# Patient Record
Sex: Male | Born: 1935 | Race: White | Hispanic: No | Marital: Married | Smoking: Former smoker
Health system: Southern US, Community
[De-identification: ages and names within clinical notes are randomized; demographics above are authoritative.]

## PROBLEM LIST (undated history)

## (undated) DIAGNOSIS — G20A1 Parkinson's disease without dyskinesia, without mention of fluctuations: Secondary | ICD-10-CM

## (undated) DIAGNOSIS — R251 Tremor, unspecified: Secondary | ICD-10-CM

## (undated) DIAGNOSIS — K219 Gastro-esophageal reflux disease without esophagitis: Secondary | ICD-10-CM

## (undated) DIAGNOSIS — E119 Type 2 diabetes mellitus without complications: Secondary | ICD-10-CM

## (undated) DIAGNOSIS — H409 Unspecified glaucoma: Secondary | ICD-10-CM

## (undated) DIAGNOSIS — D649 Anemia, unspecified: Secondary | ICD-10-CM

## (undated) DIAGNOSIS — K922 Gastrointestinal hemorrhage, unspecified: Secondary | ICD-10-CM

## (undated) DIAGNOSIS — E785 Hyperlipidemia, unspecified: Secondary | ICD-10-CM

## (undated) DIAGNOSIS — J449 Chronic obstructive pulmonary disease, unspecified: Secondary | ICD-10-CM

## (undated) DIAGNOSIS — K279 Peptic ulcer, site unspecified, unspecified as acute or chronic, without hemorrhage or perforation: Secondary | ICD-10-CM

## (undated) DIAGNOSIS — N400 Enlarged prostate without lower urinary tract symptoms: Secondary | ICD-10-CM

## (undated) DIAGNOSIS — H9190 Unspecified hearing loss, unspecified ear: Secondary | ICD-10-CM

## (undated) DIAGNOSIS — M1990 Unspecified osteoarthritis, unspecified site: Secondary | ICD-10-CM

## (undated) DIAGNOSIS — Z87442 Personal history of urinary calculi: Secondary | ICD-10-CM

## (undated) DIAGNOSIS — H3530 Unspecified macular degeneration: Secondary | ICD-10-CM

## (undated) DIAGNOSIS — I10 Essential (primary) hypertension: Secondary | ICD-10-CM

## (undated) DIAGNOSIS — G4730 Sleep apnea, unspecified: Secondary | ICD-10-CM

## (undated) DIAGNOSIS — G709 Myoneural disorder, unspecified: Secondary | ICD-10-CM

## (undated) HISTORY — PX: KIDNEY STONE SURGERY: SHX686

## (undated) HISTORY — DX: Essential (primary) hypertension: I10

## (undated) HISTORY — DX: Tremor, unspecified: R25.1

## (undated) HISTORY — DX: Type 2 diabetes mellitus without complications: E11.9

## (undated) HISTORY — DX: Unspecified hearing loss, unspecified ear: H91.90

## (undated) HISTORY — DX: Hyperlipidemia, unspecified: E78.5

## (undated) HISTORY — DX: Unspecified glaucoma: H40.9

## (undated) HISTORY — DX: Unspecified macular degeneration: H35.30

## (undated) HISTORY — DX: Unspecified osteoarthritis, unspecified site: M19.90

## (undated) HISTORY — DX: Benign prostatic hyperplasia without lower urinary tract symptoms: N40.0

## (undated) HISTORY — DX: Peptic ulcer, site unspecified, unspecified as acute or chronic, without hemorrhage or perforation: K27.9

## (undated) HISTORY — DX: Personal history of urinary calculi: Z87.442

## (undated) HISTORY — PX: CATARACT EXTRACTION: SUR2

---

## 1995-05-25 HISTORY — PX: CHOLECYSTECTOMY: SHX55

## 2004-05-24 HISTORY — PX: TOTAL KNEE ARTHROPLASTY: SHX125

## 2007-05-25 HISTORY — PX: COLONOSCOPY: SHX174

## 2007-05-25 LAB — HM COLONOSCOPY

## 2010-05-24 HISTORY — PX: BELPHAROPTOSIS REPAIR: SHX369

## 2011-12-01 LAB — CBC: platelet count: 191

## 2011-12-01 LAB — COMPREHENSIVE METABOLIC PANEL
ALT: 26 U/L (ref 10–40)
AST: 17 U/L
BUN: 19 mg/dL (ref 4–21)
Potassium: 4.2 mmol/L
Total Bilirubin: 1.1 mg/dL

## 2011-12-01 LAB — LIPID PANEL
Cholesterol, Total: 175
HDL: 47 mg/dL (ref 35–70)
Triglycerides: 152

## 2011-12-01 LAB — HEMOGLOBIN A1C: A1c: 7.4

## 2011-12-23 HISTORY — PX: US ECHOCARDIOGRAPHY: HXRAD669

## 2011-12-23 HISTORY — PX: OTHER SURGICAL HISTORY: SHX169

## 2012-10-20 ENCOUNTER — Telehealth: Payer: Self-pay | Admitting: Family Medicine

## 2012-10-20 NOTE — Telephone Encounter (Signed)
Forms in your IN box for completion. (The patient has been advised that you had to review records from previous MD prior to filling out handicapped placard since you had not seen him yet). Please return them to me when complete.   Thanks!

## 2012-10-20 NOTE — Telephone Encounter (Signed)
Please see the original note below. The medical records for this pt have just been rec'd from Dr. Leticia Clas. I have place the records along with the [place] handicap placard application on your desk to be completed. Thank you.

## 2012-10-20 NOTE — Telephone Encounter (Signed)
Pt stopped by and signed the Request & Authorization for Use/Disclosure of Protected Health Information form.  This form has been faxed to Dr. Ezzie Dural in [facility] to get pt's records.  Once records are here, the pt's handicap placard application is in Dr. Nicanor Alcon folder where we put the new pt forms prior to their visit so it can be completed prior to his July apptmt. His new pt forms are also in this folder (the application is actually attached to those).  Once this application is completed could you please notify the pt it is ready for pickup? Thank you.

## 2012-10-21 ENCOUNTER — Encounter: Payer: Self-pay | Admitting: Family Medicine

## 2012-10-21 NOTE — Telephone Encounter (Signed)
Reviewed few notes sent, updated chart, and placed form in Kim's box.

## 2012-10-23 NOTE — Telephone Encounter (Signed)
Copy made and returned to patient.

## 2012-10-24 ENCOUNTER — Encounter: Payer: Self-pay | Admitting: Family Medicine

## 2012-12-05 ENCOUNTER — Encounter: Payer: Self-pay | Admitting: Family Medicine

## 2012-12-05 ENCOUNTER — Ambulatory Visit (INDEPENDENT_AMBULATORY_CARE_PROVIDER_SITE_OTHER): Payer: Medicare Other | Admitting: Family Medicine

## 2012-12-05 ENCOUNTER — Other Ambulatory Visit: Payer: Self-pay | Admitting: Student

## 2012-12-05 VITALS — BP 116/74 | HR 84 | Temp 98.6°F | Ht 69.0 in | Wt 214.8 lb

## 2012-12-05 DIAGNOSIS — M1990 Unspecified osteoarthritis, unspecified site: Secondary | ICD-10-CM

## 2012-12-05 DIAGNOSIS — E119 Type 2 diabetes mellitus without complications: Secondary | ICD-10-CM

## 2012-12-05 DIAGNOSIS — E785 Hyperlipidemia, unspecified: Secondary | ICD-10-CM

## 2012-12-05 DIAGNOSIS — H409 Unspecified glaucoma: Secondary | ICD-10-CM

## 2012-12-05 DIAGNOSIS — N400 Enlarged prostate without lower urinary tract symptoms: Secondary | ICD-10-CM | POA: Insufficient documentation

## 2012-12-05 DIAGNOSIS — I10 Essential (primary) hypertension: Secondary | ICD-10-CM | POA: Insufficient documentation

## 2012-12-05 DIAGNOSIS — E1165 Type 2 diabetes mellitus with hyperglycemia: Secondary | ICD-10-CM | POA: Insufficient documentation

## 2012-12-05 NOTE — Progress Notes (Signed)
"stYgerwalt" Subjective:    Patient ID: Bradley [name], male    DOB: [birth year], 77 y.o.   MRN: [id number]  HPI CC: new pt to establish  Presents alone today.  Moved from [place], [place], here for last 6 months.  Hearing aides bilaterally.  DM - dx around 2007.  Averaging 115-120 fasting.  Overall good control.  Notes does better with brand amaryl.  No significant dietary variations.  Checks sugars once daily.  Last A1c   HTN - chronic issue.  Has not been on ACEI that he knows of.    HLD - compliant with lipitor, takes in am.  No myalgias.  H/o R TKR, some arthritis in R hip - occasionally walks with cane.  Preventative: Last CPE was around 11/2011 Colonoscopy - 2009 Tetanus 2012  Lives with wife, 2 dogs Grown children by first marriage Occupation: retired Education officer, museum Edu: some college  Medications and allergies reviewed and updated in chart.  Past histories reviewed and updated if relevant as below. There are no active problems to display for this patient.  Past Medical History  Diagnosis Date  . Tremor     intention  . BPH (benign prostatic hypertrophy)   . HTN (hypertension)   . Diabetes type 2, controlled   . Osteoarthritis     thumbs, hips, knees (s/p R TKR)  . Glaucoma   . Macular degeneration     right (Appenseler)  . HLD (hyperlipidemia)   . History of kidney stones   . Arthritis   . Hearing loss     bilateral hearing aids   Past Surgical History  Procedure Laterality Date  . Total knee arthroplasty Right   . Carotid US  12/2011    no significant stenosis  . US echocardiography  12/2011    nl LV fxn, EF 60%, nl valves, dilated aortic root (40mm)  . Cholecystectomy  1997  . Cataract extraction Bilateral 2011, 2012  . Belpharoptosis repair  2012  . Kidney stone surgery  83, 87, 90   History  Substance Use Topics  . Smoking status: Former Smoker    Start date: 05/24/1998  . Smokeless tobacco: Never Used  . Alcohol Use: Yes     Comment:  Occasional   Family History  Problem Relation Age of Onset  . Cancer Sister     lung, nonsmoker  . Cancer Sister     lung, nonsmoker  . Dementia Mother   . CAD Father     "heart problems"  . CAD Brother 32    MI  . Stroke Neg Hx    Allergies  Allergen Reactions  . Sulfa Antibiotics     As infant  . Penicillins Rash   No current outpatient prescriptions on file prior to visit.   No current facility-administered medications on file prior to visit.     Review of Systems  Constitutional: Negative for fever, chills, activity change, appetite change, fatigue and unexpected weight change (has lost some weight since move to [place]).  HENT: Negative for hearing loss and neck pain.   Eyes: Negative for visual disturbance.  Respiratory: Negative for cough, chest tightness, shortness of breath and wheezing.   Cardiovascular: Negative for chest pain, palpitations and leg swelling.  Gastrointestinal: Negative for nausea, vomiting, abdominal pain, diarrhea, constipation, blood in stool and abdominal distention.  Genitourinary: Negative for hematuria and difficulty urinating.  Musculoskeletal: Negative for myalgias and arthralgias.  Skin: Negative for rash.  Neurological: Positive for dizziness. Negative for seizures, syncope and  headaches.  Hematological: Negative for adenopathy. Does not bruise/bleed easily.  Psychiatric/Behavioral: Negative for dysphoric mood. The patient is not nervous/anxious.        Objective:   Physical Exam  Nursing note and vitals reviewed. Constitutional: He is oriented to person, place, and time. He appears well-developed and well-nourished. No distress.  HENT:  Head: Normocephalic and atraumatic.  Right Ear: Hearing, tympanic membrane, external ear and ear canal normal.  Left Ear: Hearing, tympanic membrane, external ear and ear canal normal.  Nose: Nose normal.  Mouth/Throat: Oropharynx is clear and moist. No oropharyngeal exudate.  Eyes: Conjunctivae and  EOM are normal. Pupils are equal, round, and reactive to light. No scleral icterus.  Neck: Normal range of motion. Neck supple. Carotid bruit is not present.  Cardiovascular: Normal rate, regular rhythm, normal heart sounds and intact distal pulses.   No murmur heard. Pulses:      Radial pulses are 2+ on the right side, and 2+ on the left side.  Pulmonary/Chest: Effort normal and breath sounds normal. No respiratory distress. He has no wheezes. He has no rales.  Abdominal: Soft. Bowel sounds are normal. He exhibits no distension and no mass. There is no tenderness. There is no rebound and no guarding.  Obese abdomen  Musculoskeletal: Normal range of motion. He exhibits no edema.  Diabetic foot exam: Normal inspection No skin breakdown No calluses  Normal DP/PT pulses Normal sensation to light tough and monofilament Nails thickened  Lymphadenopathy:    He has no cervical adenopathy.  Neurological: He is alert and oriented to person, place, and time.  CN grossly intact, station and gait intact  Skin: Skin is warm and dry. No rash noted.  Psychiatric: He has a normal mood and affect. His behavior is normal. Judgment and thought content normal.       Assessment & Plan:

## 2012-12-05 NOTE — Patient Instructions (Signed)
Return at your convenience fasting for blood work and afterwards for [employer] visit. Good to meet you today, call us with questions.

## 2012-12-05 NOTE — Assessment & Plan Note (Addendum)
Check PSA when returns for blood work.

## 2012-12-05 NOTE — Assessment & Plan Note (Signed)
Chronic, seems stable on current regimen. Foot exam today. Recheck blood work when returns fasting in next 2-3 months

## 2012-12-05 NOTE — Assessment & Plan Note (Signed)
Chronic, stable. Continue valsartan hctz - consider switch to generic.

## 2012-12-05 NOTE — Assessment & Plan Note (Signed)
Chronic, check FLP when returns fasting.

## 2012-12-05 NOTE — Assessment & Plan Note (Signed)
Chronic, stable. Continue to monitor.  

## 2012-12-05 NOTE — Assessment & Plan Note (Signed)
Followed by ophtho for glaucoma and macular degeneration.

## 2012-12-25 LAB — HM DIABETES EYE EXAM

## 2012-12-27 ENCOUNTER — Other Ambulatory Visit: Payer: Self-pay

## 2013-01-02 ENCOUNTER — Encounter: Payer: Self-pay | Admitting: Family Medicine

## 2013-01-03 ENCOUNTER — Encounter: Payer: Self-pay | Admitting: Family Medicine

## 2013-01-04 ENCOUNTER — Encounter: Payer: Self-pay | Admitting: Family Medicine

## 2013-01-04 ENCOUNTER — Ambulatory Visit (INDEPENDENT_AMBULATORY_CARE_PROVIDER_SITE_OTHER): Payer: Medicare Other | Admitting: *Deleted

## 2013-01-04 DIAGNOSIS — Z23 Encounter for immunization: Secondary | ICD-10-CM

## 2013-01-04 NOTE — Telephone Encounter (Signed)
plz call to notify Rx ready for diabetic shoes (in Kim's box) and may come in for nurse visit for pneumovax.

## 2013-01-04 NOTE — Telephone Encounter (Signed)
Nurse visit scheduled.

## 2013-01-24 ENCOUNTER — Telehealth: Payer: Self-pay | Admitting: Family Medicine

## 2013-01-24 ENCOUNTER — Ambulatory Visit (INDEPENDENT_AMBULATORY_CARE_PROVIDER_SITE_OTHER): Payer: Medicare Other | Admitting: Family Medicine

## 2013-01-24 ENCOUNTER — Encounter: Payer: Self-pay | Admitting: Family Medicine

## 2013-01-24 VITALS — BP 114/78 | HR 72 | Temp 98.4°F | Ht 69.0 in | Wt 226.5 lb

## 2013-01-24 DIAGNOSIS — L02222 Furuncle of back [any part, except buttock]: Secondary | ICD-10-CM

## 2013-01-24 DIAGNOSIS — L02219 Cutaneous abscess of trunk, unspecified: Secondary | ICD-10-CM

## 2013-01-24 DIAGNOSIS — L02212 Cutaneous abscess of back [any part, except buttock]: Secondary | ICD-10-CM

## 2013-01-24 MED ORDER — DOXYCYCLINE HYCLATE 100 MG PO TABS: 100.0000 mg | ORAL_TABLET | Freq: Two times a day (BID) | ORAL | Status: DC

## 2013-01-24 NOTE — Patient Instructions (Addendum)
Take all antibiotics. Recheck on Friday with Dr. Sharen Hones  Abscess Care After An abscess (also called a boil or furuncle) is an infected area that contains a collection of pus. Signs and symptoms of an abscess include pain, tenderness, redness, or hardness, or you may feel a moveable soft area under your skin. An abscess can occur anywhere in the body. The infection may spread to surrounding tissues causing cellulitis. A cut (incision) by the surgeon was made over your abscess and the pus was drained out. Gauze may have been packed into the space to provide a drain that will allow the cavity to heal from the inside outwards. The boil may be painful for 5 to 7 days. Most people with a boil do not have high fevers. Your abscess, if seen early, may not have localized, and may not have been lanced. If not, another appointment may be required for this if it does not get better on its own or with medications. HOME CARE INSTRUCTIONS   Only take over-the-counter or prescription medicines for pain, discomfort, or fever as directed by your caregiver.  When you bathe, soak and then remove gauze or iodoform packs at least daily or as directed by your caregiver. You may then wash the wound gently with mild soapy water. Repack with gauze or do as your caregiver directs. SEEK IMMEDIATE MEDICAL CARE IF:   You develop increased pain, swelling, redness, drainage, or bleeding in the wound site.  You develop signs of generalized infection including muscle aches, chills, fever, or a general ill feeling.  An oral temperature above 102 F (38.9 C) develops, not controlled by medication. See your caregiver for a recheck if you develop any of the symptoms described above. If medications (antibiotics) were prescribed, take them as directed. Document Released: 11/26/2004 Document Revised: 08/02/2011 Document Reviewed: 07/24/2007 [facility] Patient Information 2014 [place], [place].

## 2013-01-24 NOTE — Telephone Encounter (Signed)
Patient Information:  Caller Name: Ferne Reus  Phone: [phone]  Patient: Bradley, [name]  Gender: Male  DOB: 18-Jan-1936  Age: 77 Years  PCP: Eustaquio Boyden [facility])  Office Follow Up:  Does the office need to follow up with this patient?: No  Instructions For The Office: N/A   Symptoms  Reason For Call & Symptoms: boil on spine.  It is getting larger.  it is more red, infected with drainage (pus)  Reviewed Health History In EMR: Yes  Reviewed Medications In EMR: Yes  Reviewed Allergies In EMR: Yes  Reviewed Surgeries / Procedures: Yes  Date of Onset of Symptoms: 01/22/2013  Guideline(s) Used:  Skin Lesion - Moles or Growths  Disposition Per Guideline:   See Today in Office  Reason For Disposition Reached:   Looks infected (e.g., spreading redness, pus, red streak)  Advice Given:  N/A  Patient Will Follow Care Advice:  YES  Appointment Scheduled:  01/24/2013 11:30:00 Appointment Scheduled Provider:  Hannah Beat (Family Practice)

## 2013-01-24 NOTE — Progress Notes (Signed)
Nature conservation officer at [facility] [address] [place] [address] Phone: [phone] Fax: [phone]  Date:  01/24/2013   Name:  Bradley [name]   DOB:  1936-04-10   MRN:  [id number] Gender: male Age: 77 y.o.  Primary Physician:  Eustaquio Boyden, MD  Evaluating MD: Hannah Beat, MD   Chief Complaint: boil on back   History of Present Illness:  Bradley [name] is a 77 y.o. pleasant patient who presents with the following:  Boil on back. Monday. Since then, it has been increasing in size, become reddened, fluctuant, and warm. It has intermittently had some fluid and pus come from it. He has had no known trauma. His history is significant and complicated by diabetes type 2, which is well controlled. He has had no systemic fever. Throughout this time, there is been warmth it is been reddening and worsening in the back. It is localized and just lateral on the left near the spinous processes. No known trauma or injury.  Abscess - midback  Patient Active Problem List   Diagnosis Date Noted  . BPH (benign prostatic hypertrophy)   . HTN (hypertension)   . Diabetes type 2, controlled   . Osteoarthritis   . Glaucoma   . HLD (hyperlipidemia)     Past Medical History  Diagnosis Date  . Tremor     intention  . BPH (benign prostatic hypertrophy)   . HTN (hypertension)   . Diabetes type 2, controlled   . Osteoarthritis     thumbs, hips, knees (s/p R TKR)  . Glaucoma   . Macular degeneration     right (Appenseler)  . HLD (hyperlipidemia)   . History of kidney stones   . Hearing loss     bilateral hearing aids occasionally    Past Surgical History  Procedure Laterality Date  . Total knee arthroplasty Right   . Carotid US  12/2011    no significant stenosis  . US echocardiography  12/2011    nl LV fxn, EF 60%, nl valves, dilated aortic root (40mm)  . Cholecystectomy  1997  . Cataract extraction Bilateral 2011, 2012  . Belpharoptosis repair  2012  .  Kidney stone surgery  83, 87, 90  . Colonoscopy  2009    per patient, told no longer needed    History   Social History  . Marital Status: Married    Spouse Name: N/A    Number of Children: N/A  . Years of Education: N/A   Occupational History  . Not on file.   Social History Main Topics  . Smoking status: Former Smoker    Start date: 05/24/1998  . Smokeless tobacco: Never Used  . Alcohol Use: Yes     Comment: Occasional  . Drug Use: No  . Sexual Activity: Not on file   Other Topics Concern  . Not on file   Social History Narrative   "stYgerwalt"   Lives with wife, 2 dogs   Grown children by first marriage   Occupation: retired Education officer, museum   Edu: some college    Family History  Problem Relation Age of Onset  . Cancer Sister     lung, nonsmoker  . Cancer Sister     lung, nonsmoker  . Dementia Mother   . CAD Father     "heart problems"  . CAD Brother 22    MI  . Stroke Neg Hx     Allergies  Allergen Reactions  . Sulfa Antibiotics  As infant  . Penicillins Rash    Medication list has been reviewed and updated.  Outpatient Prescriptions Prior to Visit  Medication Sig Dispense Refill  . aspirin 81 MG tablet Take 81 mg by mouth daily.       Marland Kitchen atorvastatin (LIPITOR) 20 MG tablet Take 20 mg by mouth daily.      . brimonidine-timolol (COMBIGAN) 0.2-0.5 % ophthalmic solution Place 1 drop into both eyes every 12 (twelve) hours.      Marland Kitchen glimepiride (AMARYL) 4 MG tablet Take 8 mg by mouth daily before breakfast. BRAND      . naproxen sodium (ANAPROX) 220 MG tablet Take 220 mg by mouth 2 (two) times daily with a meal.       . tamsulosin (FLOMAX) 0.4 MG CAPS Take 0.4 mg by mouth daily.      . valsartan-hydrochlorothiazide (DIOVAN-HCT) 160-25 MG per tablet Takes 1/2 tablet by mouth daily      . Multiple Vitamins-Minerals (PRESERVISION AREDS 2 PO) Take 1 tablet by mouth 2 (two) times daily.       No facility-administered medications prior to visit.     Review of Systems:  As described above. No fever, no chest pain. No shortness of breath. He is not drinking normally. Otherwise as above.  Physical Examination: BP 114/78  Pulse 72  Temp(Src) 98.4 F (36.9 C) (Oral)  Ht 5\' 9"  (1.753 m)  Wt 226 lb 8 oz (102.74 kg)  BMI 33.43 kg/m2  Ideal Body Weight: Weight in (lb) to have BMI = 25: 168.9   GEN: WDWN, NAD, Non-toxic, A & O x 3 HEENT: Atraumatic, Normocephalic. Neck supple. No masses, No LAD. Ears and Nose: No external deformity. CV: RRR, No M/G/R. No JVD. No thrill. No extra heart sounds. PULM: CTA B, no wheezes, crackles, rhonchi. No retractions. No resp. distress. No accessory muscle use. EXTR: No c/c/e NEURO Normal gait.  PSYCH: Normally interactive. Conversant. Not depressed or anxious appearing.  Calm demeanor.   SKIN: Posterior mid back with a 4 cm area of induration and warmth. This area is reddened with a centralized area of fluctuance.  Assessment and Plan:  Abscess of back  Boil, back - Plan: Wound culture  Complex I and D of abscess in back. Send cultures, place on Doxy. Allergies noted. He will f/u on Friday for a wound recheck with Dr. Reece Agar  I&D Indication: suspect abscess Pt complaints of: erythema, pain, swelling Location: midback Size: 4 cm across Verbal informed consent obtained.  Pt aware of risks not limited to but including infection, bleeding, damage to near by organs. Prep: etoh Anesthesia: 1%lidocaine, good effect Incision made with #11 blade Would explored and loculations removed Amount expressed: 3 mL Wound packed with iodoform gauze Tolerated well Routine postprocedure instructions d/w pt- remove packing in 24-48h, keep area clean and bandaged, follow up if concerns/spreading erythema/pain.   Orders Today:  Orders Placed This Encounter  Procedures  . Wound culture    Updated Medication List: (Includes new medications, updates to list, dose adjustments) Meds ordered this encounter   Medications  . doxycycline (VIBRA-TABS) 100 MG tablet    Sig: Take 1 tablet (100 mg total) by mouth 2 (two) times daily.    Dispense:  20 tablet    Refill:  0    Medications Discontinued: There are no discontinued medications.    Signed, Elpidio Galea. Marketta Valadez, MD 01/24/2013 11:46 AM

## 2013-01-24 NOTE — Telephone Encounter (Signed)
Denise with [facility] called for appt with Dr Reece Agar; Dr Reece Agar was unable to open appt today and [facility] scheduled pt with Dr Patsy Lager.

## 2013-01-26 ENCOUNTER — Encounter: Payer: Self-pay | Admitting: Family Medicine

## 2013-01-26 ENCOUNTER — Ambulatory Visit (INDEPENDENT_AMBULATORY_CARE_PROVIDER_SITE_OTHER): Payer: Medicare Other | Admitting: Family Medicine

## 2013-01-26 VITALS — BP 118/84 | HR 68 | Temp 98.4°F | Wt 226.5 lb

## 2013-01-26 DIAGNOSIS — L02219 Cutaneous abscess of trunk, unspecified: Secondary | ICD-10-CM

## 2013-01-26 DIAGNOSIS — L02212 Cutaneous abscess of back [any part, except buttock]: Secondary | ICD-10-CM | POA: Insufficient documentation

## 2013-01-26 NOTE — Assessment & Plan Note (Signed)
Again reviewed post I&D care with patient. rec slowly remove gauze packing until it falls out on own. Red flags to return discussed. Finish abx course.

## 2013-01-26 NOTE — Patient Instructions (Addendum)
pull out 1/4 in per day of wick until it falls out. May shower.  Soapy water onto wound is ok, then pat dry. Dressing change once a day. Watch for spreading redness or draining pus - if this happens, return to see [place].  Otherwise should heal on its own.

## 2013-01-26 NOTE — Progress Notes (Signed)
  Subjective:    Patient ID: Bradley [name], male    DOB: February 24, 1936, 77 y.o.   MRN: [id number]  HPI CC: 2d f/u  Prior note reviewed.  Seen by Dr. Patsy Lager on 01/24/2013 s/p I&D of abscess on lower back.  Also placed on doxycycline. Wound cx reviewed -GPC pairs.  Denies fevers/chills, nausea, spreading redness.  Past Medical History  Diagnosis Date  . Tremor     intention  . BPH (benign prostatic hypertrophy)   . HTN (hypertension)   . Diabetes type 2, controlled   . Osteoarthritis     thumbs, hips, knees (s/p R TKR)  . Glaucoma   . Macular degeneration     right (Appenseler)  . HLD (hyperlipidemia)   . History of kidney stones   . Hearing loss     bilateral hearing aids occasionally    Review of Systems Per HPI    Objective:   Physical Exam  Nursing note and vitals reviewed. Skin:  Mid back wound packed with gauze, no surrounding erythema, no significant drainage expressed from wound.       Assessment & Plan:

## 2013-01-27 LAB — WOUND CULTURE
Gram Stain: NONE SEEN
Organism ID, Bacteria: NO GROWTH

## 2013-02-07 ENCOUNTER — Encounter: Payer: Self-pay | Admitting: Internal Medicine

## 2013-02-07 ENCOUNTER — Ambulatory Visit (INDEPENDENT_AMBULATORY_CARE_PROVIDER_SITE_OTHER): Payer: Medicare Other | Admitting: Internal Medicine

## 2013-02-07 VITALS — BP 124/68 | HR 69 | Temp 98.2°F | Wt 225.0 lb

## 2013-02-07 DIAGNOSIS — R1012 Left upper quadrant pain: Secondary | ICD-10-CM

## 2013-02-07 DIAGNOSIS — R110 Nausea: Secondary | ICD-10-CM

## 2013-02-07 NOTE — Progress Notes (Signed)
Subjective:    Patient ID: Bradley [name], male    DOB: [birth year], 77 y.o.   MRN: [id number]  HPI  Pt presents to the clinic today with c/o abdominal pain and cramping. This started 3 days ago. The pain is around the belly button. He has had some associated nausea and 1 episode of loose stool. He denies vomiting or blood in his stool. He has never had pain quite like this before. He has already had his gallbladder removed. The pain comes and goes, no relation to eating. He has not started any new meds or try anything OTC at home. He does drink 2 glasses of scotch per night.  Review of Systems      Past Medical History  Diagnosis Date  . Tremor     intention  . BPH (benign prostatic hypertrophy)   . HTN (hypertension)   . Diabetes type 2, controlled   . Osteoarthritis     thumbs, hips, knees (s/p R TKR)  . Glaucoma   . Macular degeneration     right (Appenseler)  . HLD (hyperlipidemia)   . History of kidney stones   . Hearing loss     bilateral hearing aids occasionally    Current Outpatient Prescriptions  Medication Sig Dispense Refill  . aspirin 81 MG tablet Take 81 mg by mouth daily.       Marland Kitchen atorvastatin (LIPITOR) 20 MG tablet Take 20 mg by mouth daily.      . brimonidine-timolol (COMBIGAN) 0.2-0.5 % ophthalmic solution Place 1 drop into both eyes every 12 (twelve) hours.      Marland Kitchen doxycycline (VIBRA-TABS) 100 MG tablet Take 1 tablet (100 mg total) by mouth 2 (two) times daily.  20 tablet  0  . glimepiride (AMARYL) 4 MG tablet Take 8 mg by mouth daily before breakfast. BRAND      . Multiple Vitamins-Minerals (PRESERVISION AREDS 2 PO) Take 1 tablet by mouth 2 (two) times daily.      . naproxen sodium (ANAPROX) 220 MG tablet Take 220 mg by mouth 2 (two) times daily with a meal.       . tamsulosin (FLOMAX) 0.4 MG CAPS Take 0.4 mg by mouth daily.      . valsartan-hydrochlorothiazide (DIOVAN-HCT) 160-25 MG per tablet Takes 1/2 tablet by mouth daily       No current  facility-administered medications for this visit.    Allergies  Allergen Reactions  . Sulfa Antibiotics     As infant  . Penicillins Rash    Family History  Problem Relation Age of Onset  . Cancer Sister     lung, nonsmoker  . Cancer Sister     lung, nonsmoker  . Dementia Mother   . CAD Father     "heart problems"  . CAD Brother 75    MI  . Stroke Neg Hx     History   Social History  . Marital Status: Married    Spouse Name: N/A    Number of Children: N/A  . Years of Education: N/A   Occupational History  . Not on file.   Social History Main Topics  . Smoking status: Former Smoker    Start date: 05/24/1998  . Smokeless tobacco: Never Used  . Alcohol Use: Yes     Comment: Occasional  . Drug Use: No  . Sexual Activity: Not on file   Other Topics Concern  . Not on file   Social History Narrative   "stYgerwalt"   Lives  with wife, 2 dogs   Grown children by first marriage   Occupation: retired Education officer, museum   Edu: some college     Constitutional: Denies fever, malaise, fatigue, headache or abrupt weight changes.   Gastrointestinal: Pt reports abdominal pain and cramping. Denies bloating, constipation, diarrhea or blood in the stool.  GU: Denies urgency, frequency, pain with urination, burning sensation, blood in urine, odor or discharge.   No other specific complaints in a complete review of systems (except as listed in HPI above).  Objective:   Physical Exam BP 124/68  Pulse 69  Temp(Src) 98.2 F (36.8 C) (Oral)  Wt 225 lb (102.059 kg)  BMI 33.21 kg/m2  SpO2 96% Wt Readings from Last 3 Encounters:  02/07/13 225 lb (102.059 kg)  01/26/13 226 lb 8 oz (102.74 kg)  01/24/13 226 lb 8 oz (102.74 kg)    General: Appears his stated age, obese but well developed, well nourished in NAD. Cardiovascular: Normal rate and rhythm. S1,S2 noted.  No murmur, rubs or gallops noted. No JVD or BLE edema. No carotid bruits noted. Pulmonary/Chest: Normal  effort and positive vesicular breath sounds. No respiratory distress. No wheezes, rales or ronchi noted.  Abdomen: Soft and tender in the LUQ. Normal bowel sounds, no bruits noted. No distention or masses noted. Liver, spleen and kidneys non palpable.  BMET    Component Value Date/Time   NA 141 12/01/2011   K 4.2 12/01/2011   BUN 19 12/01/2011   CREATININE 0.79 12/01/2011    Lipid Panel     Component Value Date/Time   TRIG 152 12/01/2011   HDL 47 12/01/2011    CBC    Component Value Date/Time   WBC 6.1 12/01/2011   HGB 15.3 12/01/2011         Assessment & Plan:   Crampy abdominal pain with intermittent diarrhea, new onset:  Will check CBC, BMET, amylase, lipase ? pancreatitis Take tylenol for crampy abdominal pain Pepto Bismol as needed  Will f/u after test results, RTC if new or worsening symptoms

## 2013-02-08 ENCOUNTER — Telehealth: Payer: Self-pay

## 2013-02-08 LAB — CBC
HCT: 42.2 % (ref 39.0–52.0)
RBC: 4.39 Mil/uL (ref 4.22–5.81)
RDW: 13.9 % (ref 11.5–14.6)
WBC: 7.7 10*3/uL (ref 4.5–10.5)

## 2013-02-08 LAB — COMPREHENSIVE METABOLIC PANEL
ALT: 31 U/L (ref 0–53)
Albumin: 3.8 g/dL (ref 3.5–5.2)
CO2: 32 mEq/L (ref 19–32)
Calcium: 9.3 mg/dL (ref 8.4–10.5)
Chloride: 99 mEq/L (ref 96–112)
GFR: 95.89 mL/min (ref >60.00)
Glucose, Bld: 96 mg/dL (ref 70–99)
Sodium: 138 mEq/L (ref 135–145)
Total Protein: 6.8 g/dL (ref 6.0–8.3)

## 2013-02-08 LAB — AMYLASE: Amylase: 31 U/L (ref 27–131)

## 2013-02-08 MED ORDER — PANTOPRAZOLE SODIUM 40 MG PO TBEC: 40.0000 mg | DELAYED_RELEASE_TABLET | Freq: Every day | ORAL | Status: DC

## 2013-02-08 NOTE — Telephone Encounter (Addendum)
Pt called Bradley [name] because Protonix was not at [facility]. Pt said he wanted to try protonix for stomach problem. Spoke with Carlena Sax RN team lead and advised OK to call in Protonix rx. Pt advised Protonix called in.

## 2013-02-08 NOTE — Addendum Note (Signed)
Addended by: Patience Musca on: 02/08/2013 01:37 PM   Modules accepted: Orders

## 2013-02-08 NOTE — Telephone Encounter (Signed)
Good possiblilty. We can monitor symptoms for now. If worse, we can always try protonix at a later time.

## 2013-02-08 NOTE — Telephone Encounter (Signed)
Patient states what is the possibility that it may just have been a stomach bug?

## 2013-02-08 NOTE — Telephone Encounter (Signed)
Message copied by Darnell Level on Thu Feb 08, 2013 10:49 AM ------      Message from: Lorre Munroe      Created: Thu Feb 08, 2013 10:29 AM       Please call pt and let him know all of his labs are normal. We can try putting him on Protonix to see if this will help his stomach pain and nausea. Is this something he is willing to try? If yes, call in pantoprazole 40 mg Daily, #30, 2 refills ------

## 2013-02-08 NOTE — Patient Instructions (Signed)
Abdominal Pain (Nonspecific)  Your exam might not show the exact reason you have abdominal pain. Since there are many different causes of abdominal pain, another checkup and more tests may be needed. It is very important to follow up for lasting (persistent) or worsening symptoms. A possible cause of abdominal pain in any person who still has his or her appendix is acute appendicitis. Appendicitis is often hard to diagnose. Normal blood tests, urine tests, ultrasound, and CT scans do not completely rule out early appendicitis or other causes of abdominal pain. Sometimes, only the changes that happen over time will allow appendicitis and other causes of abdominal pain to be determined. Other potential problems that may require surgery may also take time to become more apparent. Because of this, it is important that you follow all of the instructions below.  HOME CARE INSTRUCTIONS    Rest as much as possible.   Do not eat solid food until your pain is gone.   While adults or children have pain: A diet of water, weak decaffeinated tea, broth or bouillon, gelatin, oral rehydration solutions (ORS), frozen ice pops, or ice chips may be helpful.   When pain is gone in adults or children: Start a light diet (dry toast, crackers, applesauce, or white rice). Increase the diet slowly as long as it does not bother you. Eat no dairy products (including cheese and eggs) and no spicy, fatty, fried, or high-fiber foods.   Use no alcohol, caffeine, or cigarettes.   Take your regular medicines unless your caregiver told you not to.   Take any prescribed medicine as directed.   Only take over-the-counter or prescription medicines for pain, discomfort, or fever as directed by your caregiver. Do not give aspirin to children.  If your caregiver has given you a follow-up appointment, it is very important to keep that appointment. Not keeping the appointment could result in a permanent injury and/or lasting (chronic) pain  and/or disability. If there is any problem keeping the appointment, you must call to reschedule.   SEEK IMMEDIATE MEDICAL CARE IF:    Your pain is not gone in 24 hours.   Your pain becomes worse, changes location, or feels different.   You or your child has an oral temperature above 102 F (38.9 C), not controlled by medicine.   Your baby is older than 3 months with a rectal temperature of 102 F (38.9 C) or higher.   Your baby is 3 months old or younger with a rectal temperature of 100.4 F (38 C) or higher.   You have shaking chills.   You keep throwing up (vomiting) or cannot drink liquids.   There is blood in your vomit or you see blood in your bowel movements.   Your bowel movements become dark or black.   You have frequent bowel movements.   Your bowel movements stop (become blocked) or you cannot pass gas.   You have bloody, frequent, or painful urination.   You have yellow discoloration in the skin or whites of the eyes.   Your stomach becomes bloated or bigger.   You have dizziness or fainting.   You have chest or back pain.  MAKE SURE YOU:    Understand these instructions.   Will watch your condition.   Will get help right away if you are not doing well or get worse.  Document Released: 05/10/2005 Document Revised: 08/02/2011 Document Reviewed: 04/07/2009  ExitCare Patient Information 2014 [employer].

## 2013-02-15 ENCOUNTER — Telehealth: Payer: Self-pay

## 2013-02-15 NOTE — Telephone Encounter (Signed)
Pt left v/m was seen 02/07/13 by Nicki Reaper NP; pt has taken Protonix and is not having any abd pain now. Pharmacist had advised pt should not have to take Protonix very long. Since abd pain has gone can pt stop med.Please advise.

## 2013-02-15 NOTE — Telephone Encounter (Signed)
We typically recommend taking protonix for 4-6 weeks ( he can do 4 weeks) then tapering off (down to every other day then stop if pain or symptoms not returning)

## 2013-02-16 NOTE — Telephone Encounter (Signed)
Sounds perfect

## 2013-02-16 NOTE — Telephone Encounter (Signed)
Patient notified as instructed.  He states his pain stopped after about 4 days of taking the Protonix.  He also states he was only given #30, so I recommended he take it every day for 3 full weeks and then start the every other day until he uses up all the Protonix and as long as he is still not having any symptoms or pain, then he can stop the medication.  Patient in agreement with plan.  Will forward to Dr. Ermalene Searing to make sure she agrees with this plan.

## 2013-02-21 ENCOUNTER — Other Ambulatory Visit: Payer: Self-pay | Admitting: Family Medicine

## 2013-02-21 DIAGNOSIS — E119 Type 2 diabetes mellitus without complications: Secondary | ICD-10-CM

## 2013-02-21 DIAGNOSIS — I10 Essential (primary) hypertension: Secondary | ICD-10-CM

## 2013-02-21 DIAGNOSIS — N400 Enlarged prostate without lower urinary tract symptoms: Secondary | ICD-10-CM

## 2013-02-21 DIAGNOSIS — E785 Hyperlipidemia, unspecified: Secondary | ICD-10-CM

## 2013-02-28 ENCOUNTER — Other Ambulatory Visit (INDEPENDENT_AMBULATORY_CARE_PROVIDER_SITE_OTHER): Payer: Medicare Other

## 2013-02-28 DIAGNOSIS — I10 Essential (primary) hypertension: Secondary | ICD-10-CM

## 2013-02-28 DIAGNOSIS — E119 Type 2 diabetes mellitus without complications: Secondary | ICD-10-CM

## 2013-02-28 DIAGNOSIS — E785 Hyperlipidemia, unspecified: Secondary | ICD-10-CM

## 2013-02-28 DIAGNOSIS — N400 Enlarged prostate without lower urinary tract symptoms: Secondary | ICD-10-CM

## 2013-02-28 LAB — HEMOGLOBIN A1C: Hgb A1c MFr Bld: 7.1 % — ABNORMAL HIGH (ref 4.6–6.5)

## 2013-02-28 LAB — LIPID PANEL
Cholesterol: 153 mg/dL (ref 0–200)
LDL Cholesterol: 86 mg/dL (ref 0–99)
Total CHOL/HDL Ratio: 4
Triglycerides: 135 mg/dL (ref 0.0–149.0)
VLDL: 27 mg/dL (ref 0.0–40.0)

## 2013-02-28 LAB — MICROALBUMIN / CREATININE URINE RATIO
Creatinine,U: 123.5 mg/dL
Microalb, Ur: 2.3 mg/dL — ABNORMAL HIGH (ref 0.0–1.9)

## 2013-02-28 LAB — PSA: PSA: 3.14 ng/mL (ref 0.10–4.00)

## 2013-03-07 ENCOUNTER — Ambulatory Visit (INDEPENDENT_AMBULATORY_CARE_PROVIDER_SITE_OTHER): Payer: Medicare Other | Admitting: Family Medicine

## 2013-03-07 ENCOUNTER — Encounter: Payer: Self-pay | Admitting: Family Medicine

## 2013-03-07 VITALS — BP 128/82 | HR 76 | Temp 98.4°F | Ht 69.0 in | Wt 224.5 lb

## 2013-03-07 DIAGNOSIS — N400 Enlarged prostate without lower urinary tract symptoms: Secondary | ICD-10-CM

## 2013-03-07 DIAGNOSIS — M7062 Trochanteric bursitis, left hip: Secondary | ICD-10-CM | POA: Insufficient documentation

## 2013-03-07 DIAGNOSIS — E1165 Type 2 diabetes mellitus with hyperglycemia: Secondary | ICD-10-CM

## 2013-03-07 DIAGNOSIS — R109 Unspecified abdominal pain: Secondary | ICD-10-CM

## 2013-03-07 DIAGNOSIS — M76899 Other specified enthesopathies of unspecified lower limb, excluding foot: Secondary | ICD-10-CM

## 2013-03-07 DIAGNOSIS — I10 Essential (primary) hypertension: Secondary | ICD-10-CM

## 2013-03-07 DIAGNOSIS — E785 Hyperlipidemia, unspecified: Secondary | ICD-10-CM

## 2013-03-07 DIAGNOSIS — Z23 Encounter for immunization: Secondary | ICD-10-CM

## 2013-03-07 DIAGNOSIS — Z0000 Encounter for general adult medical examination without abnormal findings: Secondary | ICD-10-CM | POA: Insufficient documentation

## 2013-03-07 NOTE — Assessment & Plan Note (Signed)
PSA stable.  Will defer DRE and age out of prostate cancer screening unless new sxs develop. Discussed different meds to treat BPH, will continue flomax as workign well.

## 2013-03-07 NOTE — Patient Instructions (Addendum)
Flu shot today. Change atorvastatin to night time. Bring me copy of living will. Keep an eye on sugars - if trending up we will start second medication.  No changes today. Continue aleve.  Exercises provided today.  If not better, let us know for referral to sports medicine doctor for further evaluation and possible injection. Return in 4 months for diabetes follow up.

## 2013-03-07 NOTE — Assessment & Plan Note (Signed)
Slight deterioration today - discussed tighter compliance with diabetic diet and continued weight loss. rtc 4 mo for f/u. Continue high dose amaryl.  If persistently uncontrolled, add metformin and likely decrease amaryl dose.

## 2013-03-07 NOTE — Assessment & Plan Note (Addendum)
I have personally reviewed the Medicare Annual Wellness questionnaire and have noted 1. The patient's medical and social history 2. Their use of alcohol, tobacco or illicit drugs 3. Their current medications and supplements 4. The patient's functional ability including ADL's, fall risks, home safety risks and hearing or visual impairment. 5. Diet and physical activity 6. Evidence for depression or mood disorders The patients weight, height, BMI have been recorded in the chart.  Hearing and vision has been addressed. I have made referrals, counseling and provided education to the patient based review of the above and I have provided the pt with a written personalized care plan for preventive services. See scanned questionairre. Advanced directives discussed: discussed - will bring copy of living will  Reviewed preventative protocols and updated unless pt declined. Flu shot today. Will age out of prostate screening and colonoscopy.

## 2013-03-07 NOTE — Assessment & Plan Note (Signed)
Chronic, stable. Continue med. 

## 2013-03-07 NOTE — Assessment & Plan Note (Signed)
Chronic, great control on lipitor. Continue.

## 2013-03-07 NOTE — Assessment & Plan Note (Signed)
reviewed dx with patient - provided with stretching exercises from [facility] pt advisor.  Update if not improving as expected to consider steroid injection.

## 2013-03-07 NOTE — Assessment & Plan Note (Signed)
Resolved with protonix. Unclear etiology, but discussed if any unexpected weight loss or recurrent abd pain pt needs to return for further evaluation.  Pt agrees with plan.

## 2013-03-07 NOTE — Progress Notes (Signed)
Subjective:    Patient ID: Bradley [name], male    DOB: [birth year], 77 y.o.   MRN: [id number]  HPI CC: medicare wellness visit.  Having increasing pain in R>L hip.  No inciting trauma or injury.  Takes aleve for pain which does help.  H/o R TKR.  Takes ppx abx.  H/o OA.    DM - slightly deteriorated control.  utd foot exam and eye exam.  Checks sugars once daily.  Am sugar today 122, 2 d ago 89. Lab Results  Component Value Date   HGBA1C 7.1* 02/28/2013   Lab Results  Component Value Date   CREATININE 0.8 02/07/2013    Wt Readings from Last 3 Encounters:  03/07/13 224 lb 8 oz (101.833 kg)  02/07/13 225 lb (102.059 kg)  01/26/13 226 lb 8 oz (102.74 kg)  recent GI upset - significantly improved with protonix.   Slowly trying to lose weight.  No hearing screen - wears bilateral hearing aides No vision screen - eye exam yesterday (Appenseller for R macular degeneration).  Stable, told no need for shots. Denies falls, denies depression/anhedonia.  Preventative:  Last CPE was around 11/2011  Colonoscopy - 2009 per patient, told no longer needed Prostate cancer screening - age out.  Some BPH but well controlled on flomax. Flu shot - today Pneumovax 12/2012 Tetanus 2012  zostavax 2007 Advanced directives - has updated at home.  Will bring me copy.  No prolonged life sustaining measures.    Lives with wife, 2 dogs Grown children by first marriage  Occupation: retired Education officer, museum  Edu: some college  Medications and allergies reviewed and updated in chart.  Past histories reviewed and updated if relevant as below. Patient Active Problem List   Diagnosis Date Noted  . Abscess of back 01/26/2013  . BPH (benign prostatic hypertrophy)   . HTN (hypertension)   . Diabetes type 2, controlled   . Osteoarthritis   . Glaucoma   . HLD (hyperlipidemia)    Past Medical History  Diagnosis Date  . Tremor     intention  . BPH (benign prostatic hypertrophy)   . HTN  (hypertension)   . Diabetes type 2, controlled   . Osteoarthritis     thumbs, hips, knees (s/p R TKR)  . Glaucoma   . Macular degeneration     right (Appenseler)  . HLD (hyperlipidemia)   . History of kidney stones   . Hearing loss     bilateral hearing aids occasionally   Past Surgical History  Procedure Laterality Date  . Total knee arthroplasty Right   . Carotid US  12/2011    no significant stenosis  . US echocardiography  12/2011    nl LV fxn, EF 60%, nl valves, dilated aortic root (40mm)  . Cholecystectomy  1997  . Cataract extraction Bilateral 2011, 2012  . Belpharoptosis repair  2012  . Kidney stone surgery  85, 87, 89  . Colonoscopy  2009    per patient, told no longer needed   History  Substance Use Topics  . Smoking status: Former Smoker    Start date: 05/24/1998  . Smokeless tobacco: Never Used  . Alcohol Use: Yes     Comment: Occasional   Family History  Problem Relation Age of Onset  . Cancer Sister     lung, nonsmoker  . Cancer Sister     lung, nonsmoker  . Dementia Mother   . CAD Father     "heart problems"  . CAD Brother  62    MI  . Stroke Neg Hx    Allergies  Allergen Reactions  . Sulfa Antibiotics     As infant  . Penicillins Rash   Current Outpatient Prescriptions on File Prior to Visit  Medication Sig Dispense Refill  . aspirin 81 MG tablet Take 81 mg by mouth daily.       Marland Kitchen atorvastatin (LIPITOR) 20 MG tablet Take 20 mg by mouth daily.      . brimonidine-timolol (COMBIGAN) 0.2-0.5 % ophthalmic solution Place 1 drop into both eyes every 12 (twelve) hours.      Marland Kitchen glimepiride (AMARYL) 4 MG tablet Take 8 mg by mouth daily before breakfast. BRAND      . Multiple Vitamins-Minerals (PRESERVISION AREDS 2 PO) Take 1 tablet by mouth 2 (two) times daily.      . naproxen sodium (ANAPROX) 220 MG tablet Take 220 mg by mouth 2 (two) times daily with a meal.       . pantoprazole (PROTONIX) 40 MG tablet Take 1 tablet (40 mg total) by mouth daily.  30  tablet  2  . tamsulosin (FLOMAX) 0.4 MG CAPS Take 0.4 mg by mouth daily.      . valsartan-hydrochlorothiazide (DIOVAN-HCT) 160-25 MG per tablet Takes 1/2 tablet by mouth daily       No current facility-administered medications on file prior to visit.     Review of Systems  Constitutional: Negative for fever, chills, activity change, appetite change, fatigue and unexpected weight change.  HENT: Negative for hearing loss.   Eyes: Negative for visual disturbance.  Respiratory: Negative for cough, chest tightness, shortness of breath and wheezing.   Cardiovascular: Negative for chest pain, palpitations and leg swelling.  Gastrointestinal: Positive for abdominal pain (see hpi). Negative for nausea, vomiting, diarrhea, constipation, blood in stool and abdominal distention.  Genitourinary: Negative for hematuria and difficulty urinating.  Musculoskeletal: Negative for arthralgias, myalgias and neck pain.  Skin: Negative for rash.  Neurological: Negative for dizziness, seizures, syncope and headaches.  Hematological: Negative for adenopathy. Does not bruise/bleed easily.  Psychiatric/Behavioral: Negative for dysphoric mood. The patient is not nervous/anxious.        Objective:   Physical Exam  Nursing note and vitals reviewed. Constitutional: He is oriented to person, place, and time. He appears well-developed and well-nourished. No distress.  HENT:  Head: Normocephalic and atraumatic.  Right Ear: Hearing, tympanic membrane, external ear and ear canal normal.  Left Ear: Hearing, tympanic membrane, external ear and ear canal normal.  Nose: Nose normal.  Mouth/Throat: Oropharynx is clear and moist. No oropharyngeal exudate.  Eyes: Conjunctivae and EOM are normal. Pupils are equal, round, and reactive to light. No scleral icterus.  Neck: Normal range of motion. Neck supple. Carotid bruit is not present.  Cardiovascular: Normal rate, regular rhythm, normal heart sounds and intact distal  pulses.   No murmur heard. Pulses:      Radial pulses are 2+ on the right side, and 2+ on the left side.  Pulmonary/Chest: Effort normal and breath sounds normal. No respiratory distress. He has no wheezes. He has no rales.  Abdominal: Soft. Bowel sounds are normal. He exhibits no distension and no mass. There is no hepatosplenomegaly. There is no tenderness. There is no rebound and no guarding. No hernia.  obese  Genitourinary:  deferred  Musculoskeletal: Normal range of motion. He exhibits no edema.  No pain with int/ext rotation at hip. Point tender at GTB on left  Lymphadenopathy:    He  has no cervical adenopathy.  Neurological: He is alert and oriented to person, place, and time.  CN grossly intact, station and gait intact  Skin: Skin is warm and dry. No rash noted.  Psychiatric: He has a normal mood and affect. His behavior is normal. Judgment and thought content normal.       Assessment & Plan:

## 2013-03-24 DIAGNOSIS — K279 Peptic ulcer, site unspecified, unspecified as acute or chronic, without hemorrhage or perforation: Secondary | ICD-10-CM

## 2013-03-24 HISTORY — DX: Peptic ulcer, site unspecified, unspecified as acute or chronic, without hemorrhage or perforation: K27.9

## 2013-04-11 ENCOUNTER — Ambulatory Visit (INDEPENDENT_AMBULATORY_CARE_PROVIDER_SITE_OTHER): Payer: Medicare Other | Admitting: Internal Medicine

## 2013-04-11 ENCOUNTER — Encounter: Payer: Self-pay | Admitting: Internal Medicine

## 2013-04-11 VITALS — BP 124/78 | HR 81 | Temp 98.0°F | Wt 217.0 lb

## 2013-04-11 DIAGNOSIS — T169XXA Foreign body in ear, unspecified ear, initial encounter: Secondary | ICD-10-CM

## 2013-04-11 DIAGNOSIS — T162XXA Foreign body in left ear, initial encounter: Secondary | ICD-10-CM

## 2013-04-11 NOTE — Patient Instructions (Signed)
Ear Foreign Body °An ear foreign body is an object that is stuck in the ear. It is common for young children to put objects into the ear canal. These may include pebbles, beads, beans, and any other small objects which will fit. In adults, objects such as cotton swabs may become lodged in the ear canal. In all ages, the most common foreign bodies are insects that enter the ear canal.  °SYMPTOMS  °Foreign bodies may cause pain, buzzing or roaring sounds, hearing loss, and ear drainage.  °HOME CARE INSTRUCTIONS  °· Keep all follow-up appointments with your caregiver as told. °· Keep small objects out of reach of young children. Tell them not to put anything in their ears. °SEEK IMMEDIATE MEDICAL CARE IF:  °· You have bleeding from the ear. °· You have increased pain or swelling of the ear. °· You have reduced hearing. °· You have discharge coming from the ear. °· You have a fever. °· You have a headache. °MAKE SURE YOU:  °· Understand these instructions. °· Will watch your condition. °· Will get help right away if you are not doing well or get worse. °Document Released: 05/07/2000 Document Revised: 08/02/2011 Document Reviewed: 12/27/2007 °ExitCare® Patient Information ©2014 [employer]. ° °

## 2013-04-11 NOTE — Progress Notes (Signed)
Subjective:    Patient ID: Bradley [name], male    DOB: [birth year], 77 y.o.   MRN: [id number]  HPI  Pt presents to the clinic today with c/o "a piece of my hearing aid is stuck in my ear". This occurred yesterday. This has happened before, a few weeks ago, the same piece got stuck in his right ear and Dr. Morton Stall got it out. He denies pain, bleeding or worsening difficulty hearing.  Review of Systems      Past Medical History  Diagnosis Date  . Tremor     intention  . BPH (benign prostatic hypertrophy)   . HTN (hypertension)   . Diabetes type 2, controlled   . Osteoarthritis     thumbs, hips, knees (s/p R TKR)  . Glaucoma     Syndor  . Macular degeneration     Right (Appenseler)  . HLD (hyperlipidemia)   . History of kidney stones   . Hearing loss     bilateral hearing aids occasionally    Current Outpatient Prescriptions  Medication Sig Dispense Refill  . aspirin 81 MG tablet Take 81 mg by mouth daily.       Marland Kitchen atorvastatin (LIPITOR) 20 MG tablet Take 20 mg by mouth at bedtime.       . brimonidine-timolol (COMBIGAN) 0.2-0.5 % ophthalmic solution Place 1 drop into both eyes every 12 (twelve) hours.      Marland Kitchen glimepiride (AMARYL) 4 MG tablet Take 8 mg by mouth daily before breakfast. BRAND      . Multiple Vitamins-Minerals (PRESERVISION AREDS 2 PO) Take 1 tablet by mouth 2 (two) times daily.      . naproxen sodium (ANAPROX) 220 MG tablet Take 220 mg by mouth 2 (two) times daily with a meal.       . pantoprazole (PROTONIX) 40 MG tablet Take 1 tablet (40 mg total) by mouth daily.  30 tablet  2  . tamsulosin (FLOMAX) 0.4 MG CAPS Take 0.4 mg by mouth daily.      . valsartan-hydrochlorothiazide (DIOVAN-HCT) 160-25 MG per tablet Takes 1/2 tablet by mouth daily       No current facility-administered medications for this visit.    Allergies  Allergen Reactions  . Sulfa Antibiotics     As infant  . Penicillins Rash    Family History  Problem Relation Age of Onset  .  Cancer Sister     lung, nonsmoker  . Cancer Sister     lung, nonsmoker  . Dementia Mother   . CAD Father     "heart problems"  . CAD Brother 77    MI  . Stroke Neg Hx     History   Social History  . Marital Status: Married    Spouse Name: N/A    Number of Children: N/A  . Years of Education: N/A   Occupational History  . Not on file.   Social History Main Topics  . Smoking status: Former Smoker -- 1.00 packs/day for 45 years    Types: Cigarettes, Cigars    Start date: 05/24/1998  . Smokeless tobacco: Never Used  . Alcohol Use: Yes     Comment: Occasional  . Drug Use: No  . Sexual Activity: Not on file   Other Topics Concern  . Not on file   Social History Narrative   "stYgerwalt"   Lives with wife, 2 dogs   Grown children by first marriage   Occupation: retired Education officer, museum   Edu: some college  Constitutional: Denies fever, malaise, fatigue, headache or abrupt weight changes.  HEENT: Denies eye pain, eye redness, ear pain, ringing in the ears, wax buildup, runny nose, nasal congestion, bloody nose, or sore throat.   No other specific complaints in a complete review of systems (except as listed in HPI above).  Objective:   Physical Exam   BP 124/78  Pulse 81  Temp(Src) 98 F (36.7 C) (Oral)  Wt 217 lb (98.431 kg)  SpO2 96% Wt Readings from Last 3 Encounters:  04/11/13 217 lb (98.431 kg)  03/07/13 224 lb 8 oz (101.833 kg)  02/07/13 225 lb (102.059 kg)    General: Appears his stated age, well developed, well nourished in NAD. Skin: Warm, dry and intact. No rashes, lesions or ulcerations noted. HEENT: Head: normal shape and size; Eyes: sclera white, no icterus, conjunctiva pink, PERRLA and EOMs intact; Right Ears: Tm's gray and intact, normal light reflex; Left Ear: Small clear rubber object noted next to TM. Nose: mucosa pink and moist, septum midline; Throat/Mouth: Teeth present, mucosa pink and moist, no exudate, lesions or ulcerations  noted.   Cardiovascular: Normal rate and rhythm. S1,S2 noted.  No murmur, rubs or gallops noted. No JVD or BLE edema. No carotid bruits noted. Pulmonary/Chest: Normal effort and positive vesicular breath sounds. No respiratory distress. No wheezes, rales or ronchi noted.    BMET    Component Value Date/Time   NA 138 02/07/2013 1613   NA 141 12/01/2011   K 4.4 02/07/2013 1613   K 4.2 12/01/2011   CL 99 02/07/2013 1613   CO2 32 02/07/2013 1613   GLUCOSE 96 02/07/2013 1613   BUN 15 02/07/2013 1613   BUN 19 12/01/2011   CREATININE 0.8 02/07/2013 1613   CREATININE 0.79 12/01/2011   CALCIUM 9.3 02/07/2013 1613    Lipid Panel     Component Value Date/Time   CHOL 153 02/28/2013 0815   TRIG 135.0 02/28/2013 0815   TRIG 152 12/01/2011   HDL 40.10 02/28/2013 0815   CHOLHDL 4 02/28/2013 0815   VLDL 27.0 02/28/2013 0815   LDLCALC 86 02/28/2013 0815    CBC    Component Value Date/Time   WBC 7.7 02/07/2013 1613   RBC 4.39 02/07/2013 1613   HGB 14.2 02/07/2013 1613   HGB 15.3 12/01/2011   HCT 42.2 02/07/2013 1613   PLT 194.0 02/07/2013 1613   MCV 96.2 02/07/2013 1613   MCHC 33.5 02/07/2013 1613   RDW 13.9 02/07/2013 1613    Hgb A1C Lab Results  Component Value Date   HGBA1C 7.1* 02/28/2013        Assessment & Plan:   Foreign body in ear:  Removed by provider- no trauma noted Object returned to pt  RTC as needed

## 2013-04-20 ENCOUNTER — Ambulatory Visit (INDEPENDENT_AMBULATORY_CARE_PROVIDER_SITE_OTHER): Payer: Medicare Other | Admitting: Family Medicine

## 2013-04-20 ENCOUNTER — Ambulatory Visit: Payer: Medicare Other | Admitting: Family Medicine

## 2013-04-20 ENCOUNTER — Encounter: Payer: Self-pay | Admitting: Family Medicine

## 2013-04-20 VITALS — BP 122/78 | HR 78 | Temp 98.4°F | Wt 223.5 lb

## 2013-04-20 DIAGNOSIS — R634 Abnormal weight loss: Secondary | ICD-10-CM | POA: Insufficient documentation

## 2013-04-20 MED ORDER — PANTOPRAZOLE SODIUM 40 MG PO TBEC: 40.0000 mg | DELAYED_RELEASE_TABLET | Freq: Every day | ORAL | Status: DC

## 2013-04-20 NOTE — Progress Notes (Signed)
Pre-visit discussion using our clinic review tool. No additional management support is needed unless otherwise documented below in the visit note.  Has lost 14 lbs in the last 2 weeks on his home scales.  He didn't eat very much yesterday for thanksgiving, no appetite for the last 2 weeks.  Stomach cramps noted by patient.  Was on pantoprazole about 2 months ago for cramping w/o weight loss, his cramping at that point resolved.  He tapered off the medicine about 1 month ago.  He had unremarkable labs recently.    Cramping started back about 1 week ago.  Taking gaviscon prn in the meantime for cramping.  He still has an rx for PPI, refilled recently.  Fatigued.  No bleeding, bruising, passing blood.  No vomiting.  The cramping is in the lower abdomen, intermittently.   No diarrhea.   Colonoscopy done 2009, no polyps on last exam.    Not taking ibuprofen, BCs, goodys.  He takes 1 aleve daily for knee and hip pain.    Meds, vitals, and allergies reviewed.   ROS: See HPI.  Otherwise, noncontributory.  GEN: nad, alert and oriented HEENT: mucous membranes moist NECK: supple w/o LA CV: rrr PULM: ctab, no inc wob ABD: soft, +bs, not ttp EXT: no edema SKIN: no acute rash

## 2013-04-20 NOTE — Patient Instructions (Signed)
Take gaviscon for a few more days.  Start back on pantoprazole.  Avoid aleve, ibuprofen, BCs, Goody's.  Take tylenol as needed for pain.  If not improved in a few days, then notify us.  Take care.

## 2013-04-20 NOTE — Assessment & Plan Note (Signed)
Recent labs unremarkable.  No emergent sx.  Likely weight loss is related to dec in appetite.  Possible gastritis with nsaid use.  Would continue gaviscon for a few days as this seems to be helping, add back PPI, change to tylenol prn.  If not improved in a few days then notify us.  He agrees.  Routed to PCP as FYI.

## 2013-04-22 ENCOUNTER — Inpatient Hospital Stay: Payer: Self-pay | Admitting: Internal Medicine

## 2013-04-22 LAB — URINALYSIS, COMPLETE
Bilirubin,UR: NEGATIVE
Glucose,UR: NEGATIVE mg/dL (ref 0–75)
Leukocyte Esterase: NEGATIVE
Nitrite: NEGATIVE
Ph: 5 (ref 4.5–8.0)
RBC,UR: <1 /HPF (ref 0–5)
Specific Gravity: 1.02 (ref 1.003–1.030)
Squamous Epithelial: NONE SEEN

## 2013-04-22 LAB — COMPREHENSIVE METABOLIC PANEL
Alkaline Phosphatase: 44 U/L — ABNORMAL LOW
Anion Gap: 1 — ABNORMAL LOW (ref 7–16)
BUN: 29 mg/dL — ABNORMAL HIGH (ref 7–18)
Bilirubin,Total: 0.6 mg/dL (ref 0.2–1.0)
Calcium, Total: 8.6 mg/dL (ref 8.5–10.1)
Co2: 33 mmol/L — ABNORMAL HIGH (ref 21–32)
EGFR (African American): >60
EGFR (Non-African Amer.): >60
Osmolality: 283 (ref 275–301)
Potassium: 4.2 mmol/L (ref 3.5–5.1)
SGPT (ALT): 34 U/L (ref 12–78)
Sodium: 135 mmol/L — ABNORMAL LOW (ref 136–145)
Total Protein: 6.2 g/dL — ABNORMAL LOW (ref 6.4–8.2)

## 2013-04-22 LAB — CBC
HCT: 34.9 % — ABNORMAL LOW (ref 40.0–52.0)
MCHC: 33.7 g/dL (ref 32.0–36.0)
MCV: 94 fL (ref 80–100)
Platelet: 196 10*3/uL (ref 150–440)
RDW: 13.5 % (ref 11.5–14.5)
WBC: 8.7 10*3/uL (ref 3.8–10.6)

## 2013-04-22 LAB — PROTIME-INR
INR: 1
Prothrombin Time: 13.4 secs (ref 11.5–14.7)

## 2013-04-22 LAB — HEMOGLOBIN: HGB: 10.2 g/dL — ABNORMAL LOW (ref 13.0–18.0)

## 2013-04-22 LAB — LIPASE, BLOOD: Lipase: 172 U/L (ref 73–393)

## 2013-04-23 HISTORY — PX: ESOPHAGOGASTRODUODENOSCOPY: SHX1529

## 2013-04-23 LAB — CBC WITH DIFFERENTIAL/PLATELET
Basophil #: 0 10*3/uL (ref 0.0–0.1)
Eosinophil #: 0.2 10*3/uL (ref 0.0–0.7)
Eosinophil %: 3 %
HGB: 9.4 g/dL — ABNORMAL LOW (ref 13.0–18.0)
Lymphocyte #: 1.6 10*3/uL (ref 1.0–3.6)
Lymphocyte %: 24.6 %
MCV: 94 fL (ref 80–100)
Monocyte %: 7.8 %
RBC: 2.93 10*6/uL — ABNORMAL LOW (ref 4.40–5.90)
RDW: 13.1 % (ref 11.5–14.5)
WBC: 6.4 10*3/uL (ref 3.8–10.6)

## 2013-04-23 LAB — COMPREHENSIVE METABOLIC PANEL
Albumin: 2.6 g/dL — ABNORMAL LOW (ref 3.4–5.0)
BUN: 13 mg/dL (ref 7–18)
Bilirubin,Total: 0.6 mg/dL (ref 0.2–1.0)
Chloride: 109 mmol/L — ABNORMAL HIGH (ref 98–107)
Co2: 29 mmol/L (ref 21–32)
Creatinine: 0.68 mg/dL (ref 0.60–1.30)
EGFR (Non-African Amer.): >60
Glucose: 78 mg/dL (ref 65–99)
Potassium: 3.2 mmol/L — ABNORMAL LOW (ref 3.5–5.1)
SGPT (ALT): 25 U/L (ref 12–78)
Sodium: 143 mmol/L (ref 136–145)
Total Protein: 5.1 g/dL — ABNORMAL LOW (ref 6.4–8.2)

## 2013-04-23 LAB — HEMOGLOBIN: HGB: 9.3 g/dL — ABNORMAL LOW (ref 13.0–18.0)

## 2013-05-07 ENCOUNTER — Encounter: Payer: Self-pay | Admitting: Family Medicine

## 2013-05-23 ENCOUNTER — Encounter: Payer: Self-pay | Admitting: Family Medicine

## 2013-06-15 ENCOUNTER — Encounter: Payer: Self-pay | Admitting: Family Medicine

## 2013-07-05 ENCOUNTER — Telehealth: Payer: Self-pay | Admitting: Family Medicine

## 2013-07-05 NOTE — Telephone Encounter (Signed)
Pt wanted to make you aware he is no longer a patient here.  He is now seeing Dr[place]ance [place]off [place].  Thank you.

## 2013-07-05 NOTE — Telephone Encounter (Signed)
Removed Dr. Sharen HonesGutierrez as PCP, but [facility].

## 2013-07-05 NOTE — Telephone Encounter (Signed)
Noted.  plz remove me as pcp and add [facility] if able.

## 2013-07-09 ENCOUNTER — Ambulatory Visit: Payer: Medicare Other | Admitting: Family Medicine

## 2014-09-13 NOTE — H&P (Signed)
PATIENT NAME:  Bradley [name], Bradley [name] MR#:  [id number] DATE OF BIRTH:  Apr 27, 1936  DATE OF ADMISSION:  04/22/2013  REFERRING PHYSICIAN: Eartha Inchory R. York CeriseForbach, MD   FAMILY PHYSICIAN: Nonlocal.   REASON FOR ADMISSION: Abdominal pain.   HISTORY OF PRESENT ILLNESS: The patient is a 79 year old male who recently moved to this area and resides in [place]. The patient has a history of obesity, hypertension, diabetes and hyperlipidemia. Has been having problems with abdominal cramping and dyspepsia over the past month. He was seen by a physician in [place] and given Protonix. His symptoms persisted. He has lost 10 pounds over the past month. Two days ago, he developed melanotic stool, began to feel weak and dizzy today and presented to the Emergency Room with dark black stools associated with his weakness and dizziness. In the Emergency Room, the patient was found to have guaiac-positive stools and was anemic and is now admitted for further evaluation.   PAST MEDICAL HISTORY: 1.  Benign hypertension.  2.  Hyperlipidemia.  3.  Type 2 diabetes.  4.  Obesity.  5.  Nephrolithiasis.  6.  Osteoarthritis.  7.  Macular degeneration.  8.  Glaucoma.  9.  Bilateral hearing loss.  10.  Status post cholecystectomy.  11.  Status post right knee surgery.   MEDICATIONS: 1.  Diovan/HCT 80/12.5 mg 1 p.o. daily.  2.  Lipitor 20 mg p.o. daily.  3.  Amaryl 4 mg p.o. b.i.d.  4.  Flomax 0.4 mg p.o. daily.  5.  Combigan eyedrops as directed.  6.  Aspirin 81 mg p.o. daily.   ALLERGIES: PENICILLIN AND SULFA.   SOCIAL HISTORY: The patient drinks alcohol occasionally. Quit smoking 14 years ago. He is married.   FAMILY HISTORY: Positive for hypertension, diabetes and stroke.   REVIEW OF SYSTEMS: CONSTITUTIONAL: No fever. Weight loss of 10 pounds over the past month.  EYES: No blurred or double vision. Does have glaucoma.  ENT: No tinnitus. Has chronic hearing loss. No difficulty swallowing.  RESPIRATORY: No cough  or wheezing. Denies hemoptysis. No painful respiration.  CARDIOVASCULAR: No chest pain or orthopnea. No palpitations or syncope.  GASTROINTESTINAL: No vomiting or diarrhea.  GENITOURINARY: No dysuria or hematuria. No incontinence.  ENDOCRINE: No polyuria or polydipsia. No heat or cold intolerance.  HEMATOLOGIC: The patient denies easy bruising or anemia. No swollen glands.  MUSCULOSKELETAL: The patient denies pain in his neck, back, shoulders, knees or hips. No gout.  NEUROLOGIC: No numbness or migraines. Denies stroke or seizures.  PSYCHIATRIC: The patient denies anxiety, insomnia or depression.   PHYSICAL EXAMINATION: GENERAL: The patient is obese, in no acute distress.  VITAL SIGNS: Currently remarkable for a blood pressure of 107/68, heart rate of 88, respiratory rate of 20, temperature of 98.1.  HEENT: Normocephalic, atraumatic. Pupils equally round and reactive to light and accommodation. Extraocular movements are intact. Sclerae are anicteric. Conjunctivae are clear. Oropharynx is clear.  NECK: Supple without JVD. No adenopathy or thyromegaly noted.  LUNGS: Clear to auscultation and percussion without wheezes, rales or rhonchi. No dullness. Respiratory effort is normal.  CARDIAC: Regular rate and rhythm. Normal S1 and S2. No significant rubs, murmurs or gallops. PMI is nondisplaced. Chest wall is nontender.  ABDOMEN: Soft but diffusely tender. Some guarding but no rebound. Normoactive bowel sounds. No organomegaly or masses were appreciated. Stool was black and guaiac-positive per the Emergency Room physician.  EXTREMITIES: Without clubbing, cyanosis or edema. Pulses were 2+ bilaterally.  SKIN: Warm and dry without rash or lesions.  NEUROLOGIC: Cranial  nerves II through XII grossly intact. Deep tendon reflexes were symmetric. Motor and sensory examination is nonfocal.  PSYCHIATRIC: Revealed the patient is alert and oriented to person, place and time. He was cooperative and used good  judgment.   LABORATORY AND DIAGNOSTIC DATA: EKG revealed sinus rhythm with no acute ischemic changes. Troponin was less than 0.02. Lipase 172. Magnesium 1.5. Sugar was 217 with a BUN of 29, creatinine of 0.86 and a sodium of 135. White count was 8.7 with a hemoglobin of 11.8.   ASSESSMENT: 1.  Gastrointestinal bleed, presumably upper.  2.  Symptomatic anemia.  3.  Hypomagnesemia.  4.  Type 2 diabetes.  5.  Obesity.  6.  Benign hypertension.  7.  Hyperlipidemia.  8.  Abnormal weight loss.   PLAN: The patient will be admitted to the floor with Protonix drip and kept on clear liquids. We will continue his outpatient regimen except that we will hold his hydrochlorothiazide. We will guaiac all stools and follow his hemoglobin closely. We will obtain a GI consult for further evaluation including possible endoscopy and colonoscopy. We will supplement magnesium and follow up a level in the morning. Further treatment and evaluation will depend upon the patient's progress.   TOTAL TIME SPENT ON THIS PATIENT: 50 minutes.    ____________________________ Duane Lope Judithann Sheen, MD jds:cs D: 04/22/2013 13:13:48 ET T: 04/22/2013 14:23:48 ET JOB#: [id number]  cc: Duane Lope. Judithann Sheen, MD, <Dictator> Lilyahna Sirmon Rodena Medin MD ELECTRONICALLY SIGNED 04/22/2013 15:21

## 2014-09-13 NOTE — Consult Note (Signed)
EGD done today.  based ulcers in antrum. biopsied.  Clean based ulcer in duod bulb.   Gastritis.   40 BID x 8 weeks, then 40 dailyNSAIDSf/u h pylori serologyrepeat EGD in 8 weeks to check healing. I am repeating Hgb now, if stable, ok with d/c today.  start PO iron therapy.   Electronic Signatures: Dow Adolphein, Berdell Nevitt (MD)  (Signed on 01-Dec-14 14:08)  Authored  Last Updated: 01-Dec-14 14:08 by Dow Adolphein, Leasha Goldberger (MD)

## 2014-09-13 NOTE — Consult Note (Signed)
PATIENT NAME:  Bradley [name], Bradley [name] MR#:  [id number] DATE OF BIRTH:  03-01-1936  DATE OF CONSULTATION:  04/22/2013  REFERRING PHYSICIAN:  Duane Lope. Judithann Sheen, MD CONSULTING PHYSICIAN:  Dow Adolph, MD  REASON FOR THE CONSULT: Melena.   HISTORY OF PRESENT ILLNESS: Bradley [name] is a 79 year old male with a history notable for diabetes, hypertension, hyperlipidemia, who presented to the Emergency Room for evaluation of abdominal pain and several black bowel movements. Bradley [name] and his family report that approximately 1 to 2 months ago he started developing diffuse abdominal pain. He was started on Protonix which helped significantly with the pain. However, several weeks ago he was told to stop the Protonix and the abdominal pain has returned. Over the past 10 days, he reports a 10-pound weight loss. The abdominal pain is diffuse. In addition, over the past several days, he reports that he has had approximately 2 black bowel movements per day. These are formed and actually he had some trouble passing these stools.   Of note, he does been taking ibuprofen about 2 tabs a day for many years for knee pain. He has some occasional alcohol, just 1 to 2 drinks per week. He has never had an upper endoscopy or history of an upper GI bleed before.   PAST MEDICAL HISTORY: 1.  Hypertension.  2.  Hyperlipidemia.  3.  DM2.  4.  Obesity.  5.  Osteoarthritis.  6.  Macular degeneration.  7.  Glaucoma.   PAST SURGICAL HISTORY:  1.  Cholecystectomy.  2.  Status post right knee replacement.   MEDICATIONS AT HOME: 1.  Diovan/HCT 80/12.5 one tab daily.  2.  Lipitor 20 mg daily.  3.  Amaryl 4 mg b.i.d.  4.  Flomax 0.4 mg daily.  5.  Combigan eyedrops.  6.  Aspirin 81 mg daily.   ALLERGIES: PENICILLIN AND SULFA.   SOCIAL HISTORY: He drinks a few drinks per week. He quit smoking many years ago. No recreational drugs.   FAMILY HISTORY: No family history of colon cancer or GI malignancy that he is  aware of.   REVIEW OF SYSTEMS:   CONSTITUTIONAL: A 10-pound weight loss over the past 10 days.  No fever or chills. HEENT: No oral lesions or sore throat. No vision changes. GASTROINTESTINAL: See HPI.  HEME/LYMPH: No easy bruising or bleeding. CARDIOVASCULAR: No chest pain or dyspnea on exertion. GENITOURINARY: No hematuria. INTEGUMENTARY: No rashes or pruritus PSYCHIATRIC: No depression/anxiety.  ENDOCRINE: No heat/cold intolerance, no hair loss or skin changes. ALLERGIC/IMMUNOLOGIC: Negative for hives. RESPIRATORY: No cough, no shortness of breath.  MUSCULOSKELETAL: No joint swelling or muscle pain.   PHYSICAL EXAM: VITAL SIGNS: Temperature 97.7, pulse is 90, respirations are 17, blood pressure 104/71. Pulse ox is 92% on room air.  GENERAL: Alert and oriented times 4.  No acute distress. Appears stated age. HEENT: Normocephalic/atraumatic. Extraocular movements are intact. Anicteric. NECK: Soft, supple. JVP appears normal. No adenopathy. CHEST: Clear to auscultation. No wheeze or crackle. Respirations unlabored. HEART: Regular. No murmur, rub, or gallop.  Normal S1 and S2. ABDOMEN: Positive for abdominal adiposity, mild diffuse tenderness, soft, normoactive bowel sounds, no rebound or guarding.   EXTREMITIES: Well perfused, 1+ edema bilaterally, equal. SKIN: No rash or lesion. Skin color, texture, turgor normal. NEUROLOGICAL: Grossly intact. PSYCHIATRIC: Normal tone and affect. MUSCULOSKELETAL: No joint swelling or erythema.   LABORATORY DATA: Sodium 135, potassium 4.2, creatinine 0.86, BUN is 29, lipase is 172 which is normal. Albumin 3.2, liver enzymes otherwise normal. Troponin is normal.  White count 8.7, hemoglobin 12, hematocrit is 35, platelets are 196. INR is 1.0.   ASSESSMENT: Abdominal pain, melena: Given the improvement with proton pump inhibitor recently and also the ibuprofen use and now with melena, I do strongly suspect that he does have peptic ulcer disease. Other  etiologies would include esophagitis, gastritis, gastric malignancy, angiectasia among others.   PLAN: Please continue the PPI drip for now. Continue to monitor hemoglobin and hemodynamics. Clears for now. N.p.o. after midnight. Upper endoscopy will be performed tomorrow for further investigation of an etiology of the melena and the abdominal pain. I have placed an order for an H. pylori serology in the a.m. Further recommendations pending findings on upper endoscopy.   Thank you for this consult.    ____________________________ Dow AdolphMatthew Tahiry Spicer, MD mr:cs D: 04/22/2013 16:46:13 ET T: 04/22/2013 19:31:54 ET JOB#: [id number]  cc: Dow AdolphMatthew Nijah Tejera, MD, <Dictator> Kathalene FramesMATTHEW G Earvin Blazier MD ELECTRONICALLY SIGNED 04/30/2013 9:18

## 2014-09-13 NOTE — Discharge Summary (Signed)
PATIENT NAME:  Bradley [name], Germain MR#:  [id number] DATE OF BIRTH:  03/19/1936  DATE OF ADMISSION:  04/22/2013 DATE OF DISCHARGE:  04/23/2013  PRIMARY CARE PHYSICIAN: Will be Dr. Kandyce RudMarcus Babaoff. He works at the [employer] in [place].   FINAL DIAGNOSES:  1. Blood loss anemia, gastrointestinal bleed, gastric ulcers and duodenal ulcer.  2. Hypertension.  3. Diabetes.  4. Hypomagnesemia, hypokalemia.   MEDICATIONS ON DISCHARGE: Include hydrochlorothiazide/valsartan 12.5 mg/80 mg 1 tablet daily, atorvastatin 20 mg daily, glimepiride 4 mg 2 tablets once a day in the morning, Flomax 0.4 mg daily, Combigan 0.2/0.5 ophthalmic solution 1 drop each eye twice a day, PreserVision 1 tab twice a day, Protonix 40 mg twice a day, ferrous sulfate 325 mg daily. Stop aspirin and Aleve and alcohol.   DIET: Low-sodium diet, carbohydrate-controlled diet. Eat light for the first meal and bland diet afterwards, regular consistency.   FOLLOWUP: With Dr. Shelle Ironein, gastroenterology, for repeat endoscopy in 8 weeks, 1 to 2 weeks Dr. Kandyce RudMarcus Babaoff.    HOSPITAL COURSE: The patient was admitted 04/22/2013. Came in with abdominal pain and black diarrhea. Admitted with GI bleed, hypomagnesemia.   LABORATORY AND RADIOLOGICAL DATA DURING THE HOSPITAL COURSE: Included a urinalysis that was negative. INR 1.0. White blood cell count 8.7, H and H 11.8 and 34.9, platelet count of 196. Lipase 172. Troponin negative. Magnesium 1.5. Glucose 217, BUN 29, creatinine 0.86, sodium 135, potassium 4.2, chloride 101, CO2 33, calcium 8.6. Liver function tests: Alkaline phosphatase 44, ALT 34, AST 27, albumin 3.2. Next hemoglobin 10.2. Next magnesium 1.8, potassium 3.2. Creatinine upon discharge 0.68. Repeat hemoglobin 9.4 and a repeat hemoglobin after that 9.3. The patient had an upper endoscopy on 04/23/2013 that showed normal esophagus, gastritis, gastric ulcers with clean base which was biopsied, 1 duodenal ulcer with clean base. No active  bleeding. Dr. Shelle Ironein wrote in his note that okay with discharge if last hemoglobin was stable on Protonix and iron.   HOSPITAL COURSE PER PROBLEM LIST:  1. Acute blood loss anemia, GI bleed, gastric ulcers and duodenal ulcer found on endoscopy. The patient was hemodynamically stable. Blood counts did drop down a little bit but remained stable from the previous one, 9.3 upon discharge. The patient had no further bleeding, no further bowel movements in the hospital, no further abdominal pain. Will need a repeat endoscopy in 8 weeks to ensure healing. He was advised to stop his aspirin and Aleve and no other anti-inflammatories. Also advised not to drink alcohol. Protonix prescribed 40 mg b.i.d. Iron supplementation also given. Ferrous sulfate 325 mg daily. He was advised this may turn his stool black but it would be more constipated rather than diarrhea that he had when he came.  2. Hypertension: The patient is on Diovan, hydrochlorothiazide. If electrolyte abnormalities persist as an outpatient, may consider stopping the hydrochlorothiazide.  3. Diabetes: I advised him not to take his Amaryl today. Can go back on it tomorrow. If he is not eating well, he must cut back on the dose.  4. Hypokalemia and hypomagnesemia: These were replaced during the hospital course. Recommend a repeat check in followup appointment with Dr. Kandyce RudMarcus Babaoff. Potassium was replaced in the IV fluids. Magnesium replaced IV. Also, it did not help that the patient was n.p.o. during the entire hospital course here.    TIME SPENT ON DISCHARGE: 45 minutes.   ____________________________ Herschell Dimesichard J. Renae GlossWieting, MD rjw:gb D: 04/23/2013 15:15:42 ET T: 04/23/2013 20:12:07 ET JOB#: [id number]  cc: Herschell Dimesichard J. Renae GlossWieting, MD, <Dictator>  Kandyce Rud, MD Salley Scarlet MD ELECTRONICALLY SIGNED 04/27/2013 16:02

## 2014-09-13 NOTE — Consult Note (Signed)
Brief Consult Note: Diagnosis: UGI bleed.   Patient was seen by consultant.   Consult note dictated.   Recommend to proceed with surgery or procedure.   Orders entered.   Comments: 1.) Melena and abd pain: does take Nsaids.  Likley PUD.  Stable currently.  - cont to monitor h/h - cont protonix drip - h pylori serology ordered - EGD tomorrow - clears tonight, npo past m.n.  Electronic Signatures: Dow Adolphein, Matthew (MD)  (Signed [phone]-Nov-14 16:47)  Authored: Brief Consult Note   Last Updated: 30-Nov-14 16:47 by Dow Adolphein, Matthew (MD)

## 2014-12-31 ENCOUNTER — Ambulatory Visit: Payer: Medicare Other | Attending: Neurology

## 2014-12-31 DIAGNOSIS — E669 Obesity, unspecified: Secondary | ICD-10-CM | POA: Insufficient documentation

## 2014-12-31 DIAGNOSIS — I10 Essential (primary) hypertension: Secondary | ICD-10-CM | POA: Diagnosis present

## 2014-12-31 DIAGNOSIS — R0683 Snoring: Secondary | ICD-10-CM | POA: Insufficient documentation

## 2014-12-31 DIAGNOSIS — G4733 Obstructive sleep apnea (adult) (pediatric): Secondary | ICD-10-CM | POA: Insufficient documentation

## 2015-08-13 ENCOUNTER — Ambulatory Visit
Admission: RE | Admit: 2015-08-13 | Discharge: 2015-08-13 | Disposition: A | Discharge status: Home / Self Care | Payer: Medicare Other | Source: Ambulatory Visit | Attending: Orthopedic Surgery | Admitting: Orthopedic Surgery

## 2015-08-13 ENCOUNTER — Other Ambulatory Visit: Payer: Self-pay | Admitting: Orthopedic Surgery

## 2015-08-13 ENCOUNTER — Other Ambulatory Visit: Payer: Self-pay | Admitting: Pediatric Gastroenterology

## 2015-08-13 DIAGNOSIS — M25561 Pain in right knee: Secondary | ICD-10-CM

## 2015-08-13 DIAGNOSIS — S76191A Other specified injury of right quadriceps muscle, fascia and tendon, initial encounter: Secondary | ICD-10-CM

## 2015-08-13 DIAGNOSIS — Z96651 Presence of right artificial knee joint: Secondary | ICD-10-CM

## 2015-08-15 ENCOUNTER — Ambulatory Visit
Admission: RE | Admit: 2015-08-15 | Discharge: 2015-08-15 | Disposition: A | Discharge status: Home / Self Care | Payer: Medicare Other | Source: Ambulatory Visit | Attending: Orthopedic Surgery | Admitting: Orthopedic Surgery

## 2015-08-15 DIAGNOSIS — Z96651 Presence of right artificial knee joint: Secondary | ICD-10-CM | POA: Insufficient documentation

## 2015-08-15 DIAGNOSIS — M25561 Pain in right knee: Secondary | ICD-10-CM | POA: Insufficient documentation

## 2016-01-27 ENCOUNTER — Other Ambulatory Visit: Payer: Self-pay

## 2016-09-06 ENCOUNTER — Other Ambulatory Visit: Payer: Self-pay | Admitting: Student

## 2016-09-06 DIAGNOSIS — R109 Unspecified abdominal pain: Secondary | ICD-10-CM

## 2016-09-06 DIAGNOSIS — R1032 Left lower quadrant pain: Secondary | ICD-10-CM

## 2016-09-06 DIAGNOSIS — R319 Hematuria, unspecified: Secondary | ICD-10-CM

## 2016-09-09 ENCOUNTER — Ambulatory Visit
Admission: RE | Admit: 2016-09-09 | Discharge: 2016-09-09 | Disposition: A | Discharge status: Home / Self Care | Payer: Medicare Other | Source: Ambulatory Visit | Attending: Student | Admitting: Student

## 2016-09-09 DIAGNOSIS — R109 Unspecified abdominal pain: Secondary | ICD-10-CM | POA: Insufficient documentation

## 2016-09-09 DIAGNOSIS — N400 Enlarged prostate without lower urinary tract symptoms: Secondary | ICD-10-CM | POA: Diagnosis not present

## 2016-09-09 DIAGNOSIS — K5730 Diverticulosis of large intestine without perforation or abscess without bleeding: Secondary | ICD-10-CM | POA: Insufficient documentation

## 2016-09-09 DIAGNOSIS — R1032 Left lower quadrant pain: Secondary | ICD-10-CM | POA: Diagnosis present

## 2016-09-09 DIAGNOSIS — N201 Calculus of ureter: Secondary | ICD-10-CM | POA: Diagnosis not present

## 2016-09-09 DIAGNOSIS — R319 Hematuria, unspecified: Secondary | ICD-10-CM

## 2016-10-19 ENCOUNTER — Ambulatory Visit
Admission: RE | Admit: 2016-10-19 | Discharge: 2016-10-19 | Disposition: A | Discharge status: Home / Self Care | Payer: Medicare Other | Source: Ambulatory Visit | Attending: Urology | Admitting: Urology

## 2016-10-19 ENCOUNTER — Ambulatory Visit (INDEPENDENT_AMBULATORY_CARE_PROVIDER_SITE_OTHER): Payer: Medicare Other | Admitting: Urology

## 2016-10-19 ENCOUNTER — Encounter: Payer: Self-pay | Admitting: Urology

## 2016-10-19 VITALS — BP 122/72 | HR 70 | Ht 69.0 in | Wt 223.5 lb

## 2016-10-19 DIAGNOSIS — N201 Calculus of ureter: Secondary | ICD-10-CM | POA: Diagnosis not present

## 2016-10-19 DIAGNOSIS — R972 Elevated prostate specific antigen [PSA]: Secondary | ICD-10-CM

## 2016-10-19 DIAGNOSIS — N138 Other obstructive and reflux uropathy: Secondary | ICD-10-CM

## 2016-10-19 DIAGNOSIS — N200 Calculus of kidney: Secondary | ICD-10-CM

## 2016-10-19 DIAGNOSIS — N401 Enlarged prostate with lower urinary tract symptoms: Secondary | ICD-10-CM

## 2016-10-19 LAB — URINALYSIS, COMPLETE
Bilirubin, UA: NEGATIVE
Glucose, UA: NEGATIVE
KETONES UA: NEGATIVE
LEUKOCYTES UA: NEGATIVE
NITRITE UA: NEGATIVE
PH UA: 5.5 (ref 5.0–7.5)
Protein, UA: NEGATIVE
RBC, UA: NEGATIVE
Specific Gravity, UA: 1.015 (ref 1.005–1.030)
UUROB: 0.2 mg/dL (ref 0.2–1.0)

## 2016-10-19 NOTE — Progress Notes (Signed)
10/19/2016 11:06 AM   Bradley [name] [birth year] [id number]  Referring provider: Kandyce Rud, MD [phone] [facility] [place], [place] [address]  Chief Complaint  Patient presents with  . Establish Care    HPI: Pt seen for two issues.   1) kidney stones - pt has a h/o stones. H/o ureteral stent and ESWL. He was seen 09/09/2016 when a CT scan was done and revealed 2 left proximal 9 mm stones. They were visible on the scout. Other very small scattered stones in the kidneys. He also had bilateral parapelvic cysts which will make [place] f/u for hydro more difficult. He has not seen the stone pass but has had no further pain or hematuria. The pain resolved after an episode of urgency, frequency and weak stream. Also he had 4-10 rbc's on UA when passing the stones and today UA clear.   2) BPH with lower urinary tract symptoms-patient complaining of weak stream, intermittent flow, nocturia, urgency and frequency. On CT scan his prostate was 5.5 x 6.5 x 5.2 cm = 95 grams. He has a h/o BPH. He has increased his fluid intake and is voiding better. He is on finasteride and tamsulosin. PSA 2.9 - 3 in 2014 (or 6 ). He's been on finasteride for years.    PMH: Past Medical History:  Diagnosis Date  . BPH (benign prostatic hypertrophy)   . Diabetes type 2, controlled (HCC)   . Glaucoma    [place]  . Hearing loss    bilateral hearing aids occasionally  . History of kidney stones   . HLD (hyperlipidemia)   . HTN (hypertension)   . Macular degeneration    Right ([place])  . Osteoarthritis    thumbs, hips, knees (s/p R TKR)  . PUD (peptic ulcer disease) 03/2013   2 antral gastric ulcers and 1 duodenal ulcer, NSAID related, admitted to [facility] with melena/anemia s/p EGD  . Tremor    intention    Surgical History: Past Surgical History:  Procedure Laterality Date  . BELPHAROPTOSIS REPAIR  2012  . carotid US  12/2011   no significant stenosis    . CATARACT EXTRACTION Bilateral 2011, 2012  . CHOLECYSTECTOMY  1997  . COLONOSCOPY  2009   per patient, told no longer needed  . ESOPHAGOGASTRODUODENOSCOPY  04/2013   hospitalization @ [facility], gastritis, gastric ulcers and duodenal ulcer with clean base ([place])  . KIDNEY STONE SURGERY  83, 87, 90  . TOTAL KNEE ARTHROPLASTY Right 2006  . US ECHOCARDIOGRAPHY  12/2011   nl LV fxn, EF 60%, nl valves, dilated aortic root (40mm)    Home Medications:  Allergies as of 10/19/2016      Reactions   Sulfa Antibiotics    As infant   Penicillins Rash      Medication List       Accurate as of 10/19/16 11:06 AM. Always use your most recent med list.          aspirin 81 MG tablet Take 81 mg by mouth daily.   atorvastatin 20 MG tablet Commonly known as:  LIPITOR Take 20 mg by mouth at bedtime.   Biotin 1000 MCG Chew Chew by mouth.   COMBIGAN 0.2-0.5 % ophthalmic solution Generic drug:  brimonidine-timolol Place 1 drop into both eyes every 12 (twelve) hours.   finasteride 5 MG tablet Commonly known as:  PROSCAR Take 5 mg by mouth daily.   glimepiride 4 MG tablet Commonly known as:  AMARYL Take 8 mg by mouth daily before breakfast. BRAND   metFORMIN 500 MG tablet Commonly known as:  GLUCOPHAGE Take by mouth 2 (two) times daily with a meal.   pantoprazole 40 MG tablet Commonly known as:  PROTONIX Take 1 tablet (40 mg total) by mouth daily.   PRESERVISION AREDS 2 PO Take 1 tablet by mouth 2 (two) times daily.   tamsulosin 0.4 MG Caps capsule Commonly known as:  FLOMAX Take 0.4 mg by mouth daily.   valsartan-hydrochlorothiazide 160-25 MG tablet Commonly known as:  DIOVAN-HCT Takes 1/2 tablet by mouth daily       Allergies:  Allergies  Allergen Reactions  . Sulfa Antibiotics     As infant  . Penicillins Rash    Family History: Family History  Problem Relation Age of Onset  . Cancer Sister        lung, nonsmoker  . Cancer Sister        lung, nonsmoker  .  Dementia Mother   . CAD Father        "heart problems"  . CAD Brother 3162       MI  . Stroke Neg Hx   . Prostate cancer Neg Hx   . Bladder Cancer Neg Hx   . Kidney cancer Neg Hx     Social History:  reports that he has quit smoking. His smoking use included Cigarettes and Cigars. He started smoking about 18 years ago. He has a 45.00 pack-year smoking history. He has never used smokeless tobacco. He reports that he drinks alcohol. He reports that he does not use drugs.  ROS: UROLOGY Frequent Urination?: Yes Hard to postpone urination?: Yes Burning/pain with urination?: No Get up at night to urinate?: Yes Leakage of urine?: Yes Urine stream starts and stops?: Yes Trouble starting stream?: No Do you have to strain to urinate?: No Blood in urine?: No Urinary tract infection?: No Sexually transmitted disease?: No Injury to kidneys or bladder?: No Painful intercourse?: No Weak stream?: Yes Erection problems?: Yes Penile pain?: No  Gastrointestinal Nausea?: No Vomiting?: No Indigestion/heartburn?: No Diarrhea?: No Constipation?: No  Constitutional Fever: No Night sweats?: No Weight loss?: No Fatigue?: Yes  Skin Skin rash/lesions?: No Itching?: No  Eyes Blurred vision?: Yes Double vision?: Yes  Ears/Nose/Throat Sore throat?: No Sinus problems?: No  Hematologic/Lymphatic Swollen glands?: No Easy bruising?: Yes  Cardiovascular Leg swelling?: Yes Chest pain?: No  Respiratory Cough?: No Shortness of breath?: No  Endocrine Excessive thirst?: No  Musculoskeletal Back pain?: No Joint pain?: Yes  Neurological Headaches?: No Dizziness?: No  Psychologic Depression?: No Anxiety?: No  Physical Exam: BP 122/72 (BP Location: Left Arm, Patient Position: Sitting, Cuff Size: Normal)   Pulse 70   Ht 5\' 9"  (1.753 m)   Wt 101.4 kg (223 lb 8 oz)   BMI 33.01 kg/m   Constitutional:  Alert and oriented, No acute distress. HEENT: [place] AT, moist mucus membranes.   Trachea midline, no masses. Cardiovascular: No clubbing, cyanosis, or edema. Respiratory: Normal respiratory effort, no increased work of breathing. GI: Abdomen is soft, nontender, nondistended, no abdominal masses GU: No CVA tenderness.  DRE - prostate 50 g, smooth, no hard area or nodule Skin: No rashes, bruises or suspicious lesions. Lymph: No cervical or inguinal adenopathy. Neurologic: Grossly intact, no focal deficits, moving all 4 extremities. Psychiatric: Normal mood and affect.  Laboratory Data: Lab Results  Component Value Date   WBC 6.4 04/23/2013   HGB 9.3 (L) 04/23/2013   HCT  27.5 (L) 04/23/2013   MCV 94 04/23/2013   PLT 164 04/23/2013    Lab Results  Component Value Date   CREATININE 0.68 04/23/2013    Lab Results  Component Value Date   PSA 3.14 02/28/2013   PSA 2.9 12/01/2011    No results found for: TESTOSTERONE  Lab Results  Component Value Date   HGBA1C 7.1 (H) 02/28/2013    Urinalysis    Component Value Date/Time   COLORURINE Yellow 04/22/2013 1032   APPEARANCEUR Clear 04/22/2013 1032   LABSPEC 1.020 04/22/2013 1032   PHURINE 5.0 04/22/2013 1032   GLUCOSEU Negative 04/22/2013 1032   HGBUR Negative 04/22/2013 1032   BILIRUBINUR Negative 04/22/2013 1032   KETONESUR Negative 04/22/2013 1032   PROTEINUR Negative 04/22/2013 1032   NITRITE Negative 04/22/2013 1032   LEUKOCYTESUR Negative 04/22/2013 1032    Pertinent Imaging: CT scan   Assessment & Plan:    1. Kidney stones - check KUB. Discussed nature r/b of continued surveillance, ESWL or URS. If two stones present he may need URS and we discussed staged procedure. He likely passed the stones given the pain and LUTS resolved and his UA cleared.   - Urinalysis, Complete  2. BPH with LUTS - I discussed with the patient and his wife the nature of prostate procedures. He'll continue medications.    3) PSA elevation - PSA would correct to about 6 and we discussed the nature of PSA  elevation and nature r/b of continued surveillance vs prostate bx. All questions answered. He'll continue surveillance.   No Follow-up on file.  Jerilee Field, MD  [facility] [address], [address] [place], [place] [address] [phone]

## 2016-10-20 LAB — PSA: PROSTATE SPECIFIC AG, SERUM: 1.4 ng/mL (ref 0.0–4.0)

## 2016-10-21 ENCOUNTER — Telehealth: Payer: Self-pay

## 2016-10-21 ENCOUNTER — Other Ambulatory Visit: Payer: Self-pay | Admitting: Urology

## 2016-10-21 MED ORDER — HYDROCODONE-ACETAMINOPHEN 5-325 MG PO TABS: 1.0000 | ORAL_TABLET | Freq: Four times a day (QID) | ORAL | 0 refills | Status: DC | PRN

## 2016-10-21 NOTE — Telephone Encounter (Addendum)
Spoke to patient. Gave KUB, PSA results and instructions. Advised will have someone to contact him to schedule procedures. (Amy out of office today). Patient verbalized understanding.

## 2016-10-21 NOTE — Telephone Encounter (Signed)
Bradley [name], Matthew, MD  Ellin GoodieLowe, Jewelz Kobus S, CMA  Cc: Gwynneth MunsonBell, Tammy M; Watkins, Chelsea C, LPN        Notify patient the two stones are still present in the left ureter based on the KUB and we need to proceed with the camera look/scope and laser of the stones in the left ureter.   Notify Amy he needs to be scheduled for the following:  Left ureteroscopy, holmium laser lithotripsy and stent  Antibiotics - levaquin 500 mg IV  SCD's  Anesthesia: Gen  Labs, cxr, ekg per anesthesia   Thanks, Dr E   Previous Messages

## 2016-10-21 NOTE — Telephone Encounter (Signed)
Patient's wife notified that surgey is scheduled for 10-26-16, phone interview is 10-22-16@1 -5pm. Patient picked up pain script this morning.

## 2016-10-22 ENCOUNTER — Encounter
Admission: RE | Admit: 2016-10-22 | Discharge: 2016-10-22 | Disposition: A | Discharge status: Home / Self Care | Payer: Medicare Other | Source: Ambulatory Visit | Attending: Urology | Admitting: Urology

## 2016-10-22 DIAGNOSIS — I10 Essential (primary) hypertension: Secondary | ICD-10-CM | POA: Insufficient documentation

## 2016-10-22 DIAGNOSIS — R9431 Abnormal electrocardiogram [ECG] [EKG]: Secondary | ICD-10-CM | POA: Insufficient documentation

## 2016-10-22 DIAGNOSIS — Z01810 Encounter for preprocedural cardiovascular examination: Secondary | ICD-10-CM | POA: Insufficient documentation

## 2016-10-22 DIAGNOSIS — I4510 Unspecified right bundle-branch block: Secondary | ICD-10-CM | POA: Insufficient documentation

## 2016-10-22 DIAGNOSIS — Z01812 Encounter for preprocedural laboratory examination: Secondary | ICD-10-CM | POA: Insufficient documentation

## 2016-10-22 HISTORY — DX: Anemia, unspecified: D64.9

## 2016-10-22 HISTORY — DX: Gastro-esophageal reflux disease without esophagitis: K21.9

## 2016-10-22 HISTORY — DX: Sleep apnea, unspecified: G47.30

## 2016-10-22 NOTE — Patient Instructions (Signed)
  Your procedure is scheduled on: 10-26-16 TUESDAY Report to Same Day Surgery 2nd floor medical mall [facility]Medical Mall Entrance-take elevator on left to 2nd floor.  Check in with surgery information desk.) To find out your arrival time please call [phone] between 1PM - 3PM on 10-25-16 MONDAY  Remember: Instructions that are not followed completely may result in serious medical risk, up to and including death, or upon the discretion of your surgeon and anesthesiologist your surgery may need to be rescheduled.    _x___ 1. Do not eat food or drink liquids after midnight. No gum chewing or hard candies.     __x__ 2. No Alcohol for 24 hours before or after surgery.   __x__3. No Smoking for 24 prior to surgery.   ____  4. Bring all medications with you on the day of surgery if instructed.    __x__ 5. Notify your doctor if there is any change in your medical condition     (cold, fever, infections).     Do not wear jewelry, make-up, hairpins, clips or nail polish.  Do not wear lotions, powders, or perfumes. You may wear deodorant.  Do not shave 48 hours prior to surgery. Men may shave face and neck.  Do not bring valuables to the hospital.    [facility] is not responsible for any belongings or valuables.               Contacts, dentures or bridgework may not be worn into surgery.  Leave your suitcase in the car. After surgery it may be brought to your room.  For patients admitted to the hospital, discharge time is determined by your treatment team.   Patients discharged the day of surgery will not be allowed to drive home.  You will need someone to drive you home and stay with you the night of your procedure.    Please read over the following fact sheets that you were given:    _x___ TAKE THE FOLLOWING MEDICATION THE MORNING OF SURGERY WITH A SMALL SIP OF WATER. These include:  1. PROSCAR (FINASTERIDE)  2.  3.  4.  5.  6.  ____Fleets enema or Magnesium Citrate as directed.   ____  Use CHG Soap or sage wipes as directed on instruction sheet   ____ Use inhalers on the day of surgery and bring to hospital day of surgery  _X___ Stop Metformin and Janumet 2 days prior to surgery-LAST DOSE ON Saturday, June 2ND    ____ Take 1/2 of usual insulin dose the night before surgery and none on the morning surgery.   ____ Follow recommendations from Cardiologist, Pulmonologist or PCP regarding stopping Aspirin, Coumadin, Pllavix ,Eliquis, Effient, or Pradaxa, and Pletal.  ____Stop Anti-inflammatories such as Advil, Aleve, Ibuprofen, Motrin, Naproxen, Naprosyn, Goodies powders or aspirin products. OK to take Tylenol    _x___ Stop supplements until after surgery-STOP BIOTIN NOW   ____ Bring C-Pap to the hospital.

## 2016-10-25 ENCOUNTER — Encounter
Admission: RE | Admit: 2016-10-25 | Discharge: 2016-10-25 | Disposition: A | Discharge status: Home / Self Care | Payer: Medicare Other | Source: Ambulatory Visit | Attending: Urology | Admitting: Urology

## 2016-10-25 DIAGNOSIS — R9431 Abnormal electrocardiogram [ECG] [EKG]: Secondary | ICD-10-CM | POA: Diagnosis not present

## 2016-10-25 DIAGNOSIS — Z01818 Encounter for other preprocedural examination: Secondary | ICD-10-CM | POA: Diagnosis present

## 2016-10-25 DIAGNOSIS — I10 Essential (primary) hypertension: Secondary | ICD-10-CM | POA: Diagnosis not present

## 2016-10-25 DIAGNOSIS — I4510 Unspecified right bundle-branch block: Secondary | ICD-10-CM | POA: Diagnosis not present

## 2016-10-25 DIAGNOSIS — Z01810 Encounter for preprocedural cardiovascular examination: Secondary | ICD-10-CM | POA: Diagnosis not present

## 2016-10-25 DIAGNOSIS — Z01812 Encounter for preprocedural laboratory examination: Secondary | ICD-10-CM | POA: Diagnosis not present

## 2016-10-25 LAB — BASIC METABOLIC PANEL
Anion gap: 7 (ref 5–15)
BUN: 18 mg/dL (ref 6–20)
CO2: 32 mmol/L (ref 22–32)
CREATININE: 0.93 mg/dL (ref 0.61–1.24)
Calcium: 9.1 mg/dL (ref 8.9–10.3)
Chloride: 101 mmol/L (ref 101–111)
GFR calc Af Amer: >60 mL/min (ref >60)
GFR calc non Af Amer: >60 mL/min (ref >60)
Glucose, Bld: 184 mg/dL — ABNORMAL HIGH (ref 65–99)
Potassium: 4.3 mmol/L (ref 3.5–5.1)
SODIUM: 140 mmol/L (ref 135–145)

## 2016-10-26 ENCOUNTER — Telehealth: Payer: Self-pay | Admitting: Urology

## 2016-10-26 ENCOUNTER — Ambulatory Visit: Payer: Medicare Other | Admitting: Anesthesiology

## 2016-10-26 ENCOUNTER — Ambulatory Visit
Admission: RE | Admit: 2016-10-26 | Discharge: 2016-10-26 | Disposition: A | Discharge status: Home / Self Care | Payer: Medicare Other | Source: Ambulatory Visit | Attending: Urology | Admitting: Urology

## 2016-10-26 ENCOUNTER — Encounter
Admission: RE | Disposition: A | Discharge status: Home / Self Care | Payer: Self-pay | Source: Ambulatory Visit | Attending: Urology

## 2016-10-26 ENCOUNTER — Encounter: Payer: Self-pay | Admitting: Anesthesiology

## 2016-10-26 DIAGNOSIS — Z8719 Personal history of other diseases of the digestive system: Secondary | ICD-10-CM | POA: Diagnosis not present

## 2016-10-26 DIAGNOSIS — E669 Obesity, unspecified: Secondary | ICD-10-CM | POA: Insufficient documentation

## 2016-10-26 DIAGNOSIS — R3912 Poor urinary stream: Secondary | ICD-10-CM | POA: Diagnosis not present

## 2016-10-26 DIAGNOSIS — G4730 Sleep apnea, unspecified: Secondary | ICD-10-CM | POA: Insufficient documentation

## 2016-10-26 DIAGNOSIS — Z87442 Personal history of urinary calculi: Secondary | ICD-10-CM | POA: Insufficient documentation

## 2016-10-26 DIAGNOSIS — R3915 Urgency of urination: Secondary | ICD-10-CM | POA: Diagnosis not present

## 2016-10-26 DIAGNOSIS — N401 Enlarged prostate with lower urinary tract symptoms: Secondary | ICD-10-CM | POA: Insufficient documentation

## 2016-10-26 DIAGNOSIS — R39198 Other difficulties with micturition: Secondary | ICD-10-CM | POA: Diagnosis not present

## 2016-10-26 DIAGNOSIS — M160 Bilateral primary osteoarthritis of hip: Secondary | ICD-10-CM | POA: Insufficient documentation

## 2016-10-26 DIAGNOSIS — E785 Hyperlipidemia, unspecified: Secondary | ICD-10-CM | POA: Diagnosis not present

## 2016-10-26 DIAGNOSIS — K219 Gastro-esophageal reflux disease without esophagitis: Secondary | ICD-10-CM | POA: Insufficient documentation

## 2016-10-26 DIAGNOSIS — Z7984 Long term (current) use of oral hypoglycemic drugs: Secondary | ICD-10-CM | POA: Insufficient documentation

## 2016-10-26 DIAGNOSIS — I493 Ventricular premature depolarization: Secondary | ICD-10-CM | POA: Diagnosis not present

## 2016-10-26 DIAGNOSIS — Z6832 Body mass index (BMI) 32.0-32.9, adult: Secondary | ICD-10-CM | POA: Diagnosis not present

## 2016-10-26 DIAGNOSIS — H3530 Unspecified macular degeneration: Secondary | ICD-10-CM | POA: Diagnosis not present

## 2016-10-26 DIAGNOSIS — G252 Other specified forms of tremor: Secondary | ICD-10-CM | POA: Diagnosis not present

## 2016-10-26 DIAGNOSIS — Z96651 Presence of right artificial knee joint: Secondary | ICD-10-CM | POA: Insufficient documentation

## 2016-10-26 DIAGNOSIS — M170 Bilateral primary osteoarthritis of knee: Secondary | ICD-10-CM | POA: Insufficient documentation

## 2016-10-26 DIAGNOSIS — R351 Nocturia: Secondary | ICD-10-CM | POA: Insufficient documentation

## 2016-10-26 DIAGNOSIS — M180 Bilateral primary osteoarthritis of first carpometacarpal joints: Secondary | ICD-10-CM | POA: Insufficient documentation

## 2016-10-26 DIAGNOSIS — E119 Type 2 diabetes mellitus without complications: Secondary | ICD-10-CM | POA: Insufficient documentation

## 2016-10-26 DIAGNOSIS — Z8249 Family history of ischemic heart disease and other diseases of the circulatory system: Secondary | ICD-10-CM | POA: Insufficient documentation

## 2016-10-26 DIAGNOSIS — N132 Hydronephrosis with renal and ureteral calculous obstruction: Secondary | ICD-10-CM | POA: Insufficient documentation

## 2016-10-26 DIAGNOSIS — H409 Unspecified glaucoma: Secondary | ICD-10-CM | POA: Insufficient documentation

## 2016-10-26 DIAGNOSIS — R350 Frequency of micturition: Secondary | ICD-10-CM | POA: Diagnosis not present

## 2016-10-26 DIAGNOSIS — N201 Calculus of ureter: Secondary | ICD-10-CM | POA: Diagnosis not present

## 2016-10-26 DIAGNOSIS — R972 Elevated prostate specific antigen [PSA]: Secondary | ICD-10-CM | POA: Diagnosis not present

## 2016-10-26 DIAGNOSIS — N400 Enlarged prostate without lower urinary tract symptoms: Secondary | ICD-10-CM

## 2016-10-26 DIAGNOSIS — Z7982 Long term (current) use of aspirin: Secondary | ICD-10-CM | POA: Insufficient documentation

## 2016-10-26 DIAGNOSIS — Z880 Allergy status to penicillin: Secondary | ICD-10-CM | POA: Insufficient documentation

## 2016-10-26 DIAGNOSIS — Z882 Allergy status to sulfonamides status: Secondary | ICD-10-CM | POA: Insufficient documentation

## 2016-10-26 DIAGNOSIS — Z801 Family history of malignant neoplasm of trachea, bronchus and lung: Secondary | ICD-10-CM | POA: Insufficient documentation

## 2016-10-26 DIAGNOSIS — I4510 Unspecified right bundle-branch block: Secondary | ICD-10-CM | POA: Diagnosis not present

## 2016-10-26 DIAGNOSIS — Z79899 Other long term (current) drug therapy: Secondary | ICD-10-CM | POA: Insufficient documentation

## 2016-10-26 DIAGNOSIS — I10 Essential (primary) hypertension: Secondary | ICD-10-CM | POA: Insufficient documentation

## 2016-10-26 DIAGNOSIS — Z8711 Personal history of peptic ulcer disease: Secondary | ICD-10-CM | POA: Insufficient documentation

## 2016-10-26 DIAGNOSIS — Z87891 Personal history of nicotine dependence: Secondary | ICD-10-CM | POA: Insufficient documentation

## 2016-10-26 HISTORY — PX: URETEROSCOPY: SHX842

## 2016-10-26 HISTORY — PX: CYSTOSCOPY WITH STENT PLACEMENT: SHX5790

## 2016-10-26 LAB — GLUCOSE, CAPILLARY
GLUCOSE-CAPILLARY: 182 mg/dL — AB (ref 65–99)
GLUCOSE-CAPILLARY: 209 mg/dL — AB (ref 65–99)

## 2016-10-26 SURGERY — URETEROSCOPY
Anesthesia: General | Site: Ureter | Laterality: Left | Wound class: Clean Contaminated

## 2016-10-26 MED ORDER — ROCURONIUM BROMIDE 100 MG/10ML IV SOLN
INTRAVENOUS | Status: DC | PRN
  Administered 2016-10-26: 10 mg via INTRAVENOUS
  Administered 2016-10-26: 30 mg via INTRAVENOUS

## 2016-10-26 MED ORDER — CIPROFLOXACIN IN D5W 400 MG/200ML IV SOLN
400.0000 mg | Freq: Two times a day (BID) | INTRAVENOUS | Status: DC
  Administered 2016-10-26: 400 mg via INTRAVENOUS

## 2016-10-26 MED ORDER — LIDOCAINE HCL (PF) 2 % IJ SOLN
INTRAMUSCULAR | Status: AC
  Filled 2016-10-26: qty 2

## 2016-10-26 MED ORDER — PHENYLEPHRINE HCL 10 MG/ML IJ SOLN
INTRAMUSCULAR | Status: DC | PRN
  Administered 2016-10-26 (×2): 80 ug via INTRAVENOUS

## 2016-10-26 MED ORDER — IOTHALAMATE MEGLUMINE 43 % IV SOLN
INTRAVENOUS | Status: DC | PRN
  Administered 2016-10-26: 20 mL via URETHRAL

## 2016-10-26 MED ORDER — HYDROCODONE-ACETAMINOPHEN 5-325 MG PO TABS: 1.0000 | ORAL_TABLET | Freq: Four times a day (QID) | ORAL | 0 refills | Status: DC | PRN

## 2016-10-26 MED ORDER — SODIUM CHLORIDE 0.9 % IV SOLN
INTRAVENOUS | Status: DC
  Administered 2016-10-26: 08:00:00 via INTRAVENOUS

## 2016-10-26 MED ORDER — ROCURONIUM BROMIDE 50 MG/5ML IV SOLN
INTRAVENOUS | Status: AC
  Filled 2016-10-26: qty 1

## 2016-10-26 MED ORDER — ONDANSETRON HCL 4 MG/2ML IJ SOLN: 4.0000 mg | Freq: Once | INTRAMUSCULAR | Status: DC | PRN

## 2016-10-26 MED ORDER — CIPROFLOXACIN IN D5W 400 MG/200ML IV SOLN
INTRAVENOUS | Status: AC
  Filled 2016-10-26: qty 200

## 2016-10-26 MED ORDER — FENTANYL CITRATE (PF) 100 MCG/2ML IJ SOLN
INTRAMUSCULAR | Status: AC
  Filled 2016-10-26: qty 2

## 2016-10-26 MED ORDER — DOCUSATE SODIUM 100 MG PO CAPS: 100.0000 mg | ORAL_CAPSULE | Freq: Two times a day (BID) | ORAL | 0 refills | Status: DC

## 2016-10-26 MED ORDER — PROPOFOL 10 MG/ML IV BOLUS
INTRAVENOUS | Status: AC
  Filled 2016-10-26: qty 20

## 2016-10-26 MED ORDER — FAMOTIDINE 20 MG PO TABS
ORAL_TABLET | ORAL | Status: AC
  Filled 2016-10-26: qty 1

## 2016-10-26 MED ORDER — LIDOCAINE HCL (CARDIAC) 20 MG/ML IV SOLN
INTRAVENOUS | Status: DC | PRN
  Administered 2016-10-26: 60 mg via INTRAVENOUS

## 2016-10-26 MED ORDER — ONDANSETRON HCL 4 MG/2ML IJ SOLN
INTRAMUSCULAR | Status: AC
  Filled 2016-10-26: qty 2

## 2016-10-26 MED ORDER — FAMOTIDINE 20 MG PO TABS
20.0000 mg | ORAL_TABLET | Freq: Once | ORAL | Status: AC
  Administered 2016-10-26: 20 mg via ORAL

## 2016-10-26 MED ORDER — PROPOFOL 10 MG/ML IV BOLUS
INTRAVENOUS | Status: DC | PRN
  Administered 2016-10-26: 100 mg via INTRAVENOUS
  Administered 2016-10-26: 50 mg via INTRAVENOUS

## 2016-10-26 MED ORDER — FENTANYL CITRATE (PF) 100 MCG/2ML IJ SOLN
INTRAMUSCULAR | Status: DC | PRN
  Administered 2016-10-26: 25 ug via INTRAVENOUS
  Administered 2016-10-26: 50 ug via INTRAVENOUS

## 2016-10-26 MED ORDER — FENTANYL CITRATE (PF) 100 MCG/2ML IJ SOLN: 25.0000 ug | INTRAMUSCULAR | Status: DC | PRN

## 2016-10-26 MED ORDER — SUGAMMADEX SODIUM 200 MG/2ML IV SOLN
INTRAVENOUS | Status: AC
  Filled 2016-10-26: qty 2

## 2016-10-26 MED ORDER — SUGAMMADEX SODIUM 200 MG/2ML IV SOLN
INTRAVENOUS | Status: DC | PRN
  Administered 2016-10-26: 200 mg via INTRAVENOUS

## 2016-10-26 SURGICAL SUPPLY — 31 items
BAG DRAIN CYSTO-URO LG1000N (MISCELLANEOUS) ×4 IMPLANT
BASKET ZERO TIP 1.9FR (BASKET) IMPLANT
CATH URETL 5X70 OPEN END (CATHETERS) ×4 IMPLANT
CNTNR SPEC 2.5X3XGRAD LEK (MISCELLANEOUS) ×2
CONRAY 43 FOR UROLOGY 50M (MISCELLANEOUS) ×4 IMPLANT
CONT SPEC 4OZ STER OR WHT (MISCELLANEOUS) ×2
CONTAINER SPEC 2.5X3XGRAD LEK (MISCELLANEOUS) ×2 IMPLANT
DRAPE UTILITY 15X26 TOWEL STRL (DRAPES) ×4 IMPLANT
GLOVE BIO SURGEON STRL SZ 6.5 (GLOVE) ×3 IMPLANT
GLOVE BIO SURGEONS STRL SZ 6.5 (GLOVE) ×1
GOWN STRL REUS W/ TWL LRG LVL3 (GOWN DISPOSABLE) ×4 IMPLANT
GOWN STRL REUS W/TWL LRG LVL3 (GOWN DISPOSABLE) ×4
GUIDEWIRE GREEN .038 145CM (MISCELLANEOUS) IMPLANT
GUIDEWIRE INTRO SET STRAIGHT (WIRE) ×4 IMPLANT
GUIDEWIRE SUPER STIFF (WIRE) ×4 IMPLANT
INFUSOR MANOMETER BAG 3000ML (MISCELLANEOUS) ×4 IMPLANT
INTRODUCER DILATOR DOUBLE (INTRODUCER) ×4 IMPLANT
KIT RM TURNOVER CYSTO AR (KITS) ×4 IMPLANT
PACK CYSTO AR (MISCELLANEOUS) ×4 IMPLANT
SCRUB POVIDONE IODINE 4 OZ (MISCELLANEOUS) ×4 IMPLANT
SENSORWIRE 0.038 NOT ANGLED (WIRE) ×8
SET CYSTO W/LG BORE CLAMP LF (SET/KITS/TRAYS/PACK) ×4 IMPLANT
SHEATH URETERAL 12FRX35CM (MISCELLANEOUS) IMPLANT
SOL .9 NS 3000ML IRR  AL (IV SOLUTION) ×2
SOL .9 NS 3000ML IRR UROMATIC (IV SOLUTION) ×2 IMPLANT
STENT URET 6FRX24 CONTOUR (STENTS) IMPLANT
STENT URET 6FRX26 CONTOUR (STENTS) ×4 IMPLANT
SURGILUBE 2OZ TUBE FLIPTOP (MISCELLANEOUS) ×4 IMPLANT
SYRINGE IRR TOOMEY STRL 70CC (SYRINGE) ×4 IMPLANT
WATER STERILE IRR 1000ML POUR (IV SOLUTION) ×4 IMPLANT
WIRE SENSOR 0.038 NOT ANGLED (WIRE) ×4 IMPLANT

## 2016-10-26 NOTE — Anesthesia Preprocedure Evaluation (Addendum)
Anesthesia Evaluation  Patient identified by MRN, date of birth, ID band Patient awake    Reviewed: Allergy & Precautions, NPO status , Patient's Chart, lab work & pertinent test results, reviewed documented beta blocker date and time   Airway Mallampati: III  TM Distance: >3 FB     Dental  (+) Chipped, Upper Dentures, Partial Lower   Pulmonary sleep apnea , former smoker,           Cardiovascular hypertension, Pt. on medications      Neuro/Psych    GI/Hepatic PUD, GERD  Controlled,  Endo/Other  diabetes, Type 2  Renal/GU      Musculoskeletal  (+) Arthritis ,   Abdominal   Peds  Hematology  (+) anemia ,   Anesthesia Other Findings Obese. Has had PVCs and Rbbb. Uses CPAP off and on. Decreased hearing.  Reproductive/Obstetrics                            Anesthesia Physical Anesthesia Plan  ASA: III  Anesthesia Plan: General   Post-op Pain Management:    Induction: Intravenous  PONV Risk Score and Plan:   Airway Management Planned: Oral ETT  Additional Equipment:   Intra-op Plan:   Post-operative Plan:   Informed Consent: I have reviewed the patients History and Physical, chart, labs and discussed the procedure including the risks, benefits and alternatives for the proposed anesthesia with the patient or authorized representative who has indicated his/her understanding and acceptance.     Plan Discussed with: CRNA  Anesthesia Plan Comments:         Anesthesia Quick Evaluation

## 2016-10-26 NOTE — Telephone Encounter (Signed)
Amy this is the message we discussed  Thanks, Marcelino DusterMichelle

## 2016-10-26 NOTE — Op Note (Signed)
Date of procedure: 10/26/16  Preoperative diagnosis:  1. Left ureteral calcification 2 2. BPH   Postoperative diagnosis:  1. Same as above   Procedure: 1. Left retrograde pyelogram  2. left ureteral stent placement 3. Attempted left ureteroscopy, unsuccessful 4. Cystoscopy  Surgeon: [name], MD  Anesthesia: General  Complications: None  Intraoperative findings: Massive intravesical median lobe with ball-valve-type appearance. Left ureteral stones 2 seen within the ureter. Due to J hooking of the distal ureter, large median lobe, and nondilated ureter, unable to access left ureter ureteroscopically. Stent placed.   EBL: Minimal   Specimens: None   Drains: 6 x 26 French double-J ureteral stent on left  Indication: Bradley [name] is a 81 y.o. patient with 2 obstructing 1 cm left ureteral stones who presents today for management of his stones. He also has a history of massive BPH.Marland Kitchen  After reviewing the management options for treatment, he elected to proceed with the above surgical procedure(s). We have discussed the potential benefits and risks of the procedure, side effects of the proposed treatment, the likelihood of the patient achieving the goals of the procedure, and any potential problems that might occur during the procedure or recuperation. Informed consent has been obtained.  Description of procedure:  The patient was taken to the operating room and general anesthesia was induced.  The patient was placed in the dorsal lithotomy position, prepped and draped in the usual sterile fashion, and preoperative antibiotics were administered. A preoperative time-out was performed.   A 21 French cystoscope was advanced per urethra into the bladder. Upon placing the scope, immediately severe trilobar coaptation was appreciated within the prostatic urethra with a long urethral length. Upon entry into the bladder, a large masslike median lobe was appreciated which made access  to the UO somewhat difficult. Ultimately, I was able to identify the left ureteral orifice which was cannulated using a 5 [place] open-ended ureteral catheter. On scout imaging, the stones could be seen within the mid and proximal ureter. Retropyelogram was performed which showed filling defects at this level with hydronephrosis. With some difficult manipulation, ultimately I was able to get a sensor wire beyond the level of the stones up to level the renal pelvis. This is somewhat difficult due to tortuosity of the ureter, J-hook at the distal ureter, and the stones themselves. She had several different maneuvers to advance a 7 French flexible ureteroscope through the distal ureter but was ultimately unsuccessful. These include gentle dilation using a 10 dilator, introducing a second Super Stiff wire and tried to advance the scope over the Super Stiff wire, amongst others. Based on the location of the stones in the angulation of the ureter, I did not feel that a semirigid would be successful or useful in this situation. Finally, the safety wire was backloaded over a rigid cystoscope and a 6 x 26 French double-J ureteral stent was advanced over the wire up to level of the renal pelvis. Large partially drawn until full coil was noted within the renal pelvis. The wire was then fully withdrawn and a full coil was noted within the bladder. The bladder was then drained. Due to the very large median lobe, distal coil of the loop was displaced superiorly to significant degree. The patient was then cleaned and dried, repositioned the supine position, reversed from anesthesia, taken to the PACU in stable condition.  Plan: I had a lengthy discussion today with the patient's wife about how to proceed.  Due to the median lobe and the  distortion of his anatomy, one option would include resection or enucleation of the median lobe help facilitate ureteroscopy. This also help with his voiding symptoms as he is currently on maximal  medical therapy including finasteride and Flomax and continues to have significant obstructive voiding symptoms.  Alternatively, we could pursue shockwave lithotripsy which would likely need to be done twice in order to treat each stone. As a third option, second attempt at ureteroscopy without treating his median lobe was also discussed. Ultimately, we elected to proceed with holmium laser enucleation of the prostate and ureteroscopic manipulation of the stones.  We had a lengthy discussion today about the risk and benefits of the procedure. He and his wife understand the risk of bleeding, infection, damage to surrounding structures, need for Foley catheterization, failure of improvement, worsening of his irritative voiding symptoms, amongst others. Injury to the bladder was also discussed with morcellation. We also discussed that we will attempt to perform ureteroscopy at the time of the median lobe enucleation, but depending on visualization and the degree of hematuria, this may or may not be possible.  All questions were answered. We'll arrange for this procedure next week.  Vanna ScotlandAshley Wahneta Derocher, M.D.

## 2016-10-26 NOTE — Anesthesia Postprocedure Evaluation (Signed)
Anesthesia Post Note  Patient: Bradley [name]  Procedure(s) Performed: Procedure(s) (LRB): URETEROSCOPY (Left) CYSTOSCOPY WITH STENT PLACEMENT (Left)  Patient location during evaluation: PACU Anesthesia Type: General Level of consciousness: awake and alert Pain management: pain level controlled Vital Signs Assessment: post-procedure vital signs reviewed and stable Respiratory status: spontaneous breathing, nonlabored ventilation, respiratory function stable and patient connected to nasal cannula oxygen Cardiovascular status: blood pressure returned to baseline and stable Postop Assessment: no signs of nausea or vomiting Anesthetic complications: no     Last Vitals:  Vitals:   10/26/16 1040 10/26/16 1103  BP: 140/77 (!) 147/86  Pulse: 75 82  Resp: 17 17  Temp: 36.1 C     Last Pain:  Vitals:   10/26/16 1040  TempSrc: Temporal  PainSc:                  Sami Roes S

## 2016-10-26 NOTE — Discharge Instructions (Signed)
You have a ureteral stent in place.  This is a tube that extends from your kidney to your bladder.  This may cause urinary bleeding, burning with urination, and urinary frequency.  Please call our office or present to the ED if you develop fevers >101 or pain which is not able to be controlled with oral pain medications.  You may be given either Flomax and/ or ditropan to help with bladder spasms and stent pain in addition to pain medications.    Drink, drink, drink! Drink plenty of water to keep her bladder flushed.  [facility] [address] [place], [place] [address] [phone]      Please notify primary physician today concerning elevated blood sugar, pre op 182/post op 209, no insulin given.   Ureteral Stent Implantation Ureteral stent implantation is a procedure to insert (implant) a flexible, soft, plastic tube (stent) into a tube (ureter) that drains urine from the kidneys. The stent supports the ureter while it heals and helps to drain urine from the kidneys. You may have a ureteral stent implanted after having a procedure to remove a blockage from the ureter (ureterolysis or pyeloplasty).You may also have a stent implanted to open the flow of urine when you have a blockage caused by a kidney stone, tumor, blood clot, or infection. You have two ureters, one on each side of the body. The ureters connect the kidneys to the organ that holds urine until it passes out of the body (bladder). The stent is placed so that one end is in the kidney, and one end is in the bladder. The stent is usually taken out after your ureter has healed. Depending on your condition, you may have a stent for just a few weeks, or you may have a long-term stent that will need to be replaced every few months. Tell a health care provider about:  Any allergies you have.  All medicines you are taking, including vitamins, herbs, eye drops, creams, and over-the-counter  medicines.  Any problems you or family members have had with anesthetic medicines.  Any blood disorders you have.  Any surgeries you have had.  Any medical conditions you have.  Whether you are pregnant or may be pregnant. What are the risks? Generally, this is a safe procedure. However, problems may occur, including:  Infection.  Bleeding.  Allergic reactions to medicines.  Damage to other structures or organs. Tearing (perforation) of the ureter is possible.  Movement of the stent away from where it is placed during surgery (migration).  What happens before the procedure?  Ask your health care provider about: ? Changing or stopping your regular medicines. This is especially important if you are taking diabetes medicines or blood thinners. ? Taking medicines such as aspirin and ibuprofen. These medicines can thin your blood. Do not take these medicines before your procedure if your health care provider instructs you not to.  Follow instructions from your health care provider about eating or drinking restrictions.  Do not drink alcohol and do not use any tobacco products before your procedure, as told by your health care provider.  You may be given antibiotic medicine to help prevent infection.  Plan to have someone take you home after the procedure.  If you go home right after the procedure, plan to have someone with you for 24 hours. What happens during the procedure?  An IV tube will be inserted into one of your veins.  You will be given a medicine  to make you fall asleep (general anesthetic). You may also be given a medicine to help you relax (sedative).  A thin, tube-shaped instrument with a light and tiny camera at the end (cystoscope) will be inserted into your urethra. The urethra is the tube that drains urine from the bladder out of the body. In men, the urethra opens at the end of the penis. In women, the urethra opens in front of the vaginal opening.  The  cystoscope will be passed into your bladder.  A thin wire (guide wire) will be passed through your bladder and into your ureter. This is used to guide the stent into your ureter.  The stent will be inserted into your ureter.  The guide wire and the cystoscope will be removed.  A flexible tube (catheter) will be inserted through your urethra so that one end is in your bladder. This helps to drain urine from your bladder. The procedure may vary among hospitals and health care providers. What happens after the procedure?  Your blood pressure, heart rate, breathing rate, and blood oxygen level will be monitored often until the medicines you were given have worn off.  You may continue to receive medicine and fluids through an IV tube.  You may have some soreness or pain in your abdomen and urethra. Medicines will be available to help you.  You will be encouraged to get up and walk around as soon as you can.  You will have a catheter draining your urine.  You will have some blood in your urine.  Do not drive for 24 hours if you received a sedative. This information is not intended to replace advice given to you by your health care provider. Make sure you discuss any questions you have with your health care provider. Document Released: 05/07/2000 Document Revised: 10/16/2015 Document Reviewed: 11/22/2014 Elsevier Interactive Patient Education  2018 [employer]   AMBULATORY SURGERY  DISCHARGE INSTRUCTIONS   1) The drugs that you were given will stay in your system until tomorrow so for the next 24 hours you should not:  A) Drive an automobile B) Make any legal decisions C) Drink any alcoholic beverage   2) You may resume regular meals tomorrow.  Today it is better to start with liquids and gradually work up to solid foods.  You may eat anything you prefer, but it is better to start with liquids, then soup and crackers, and gradually work up to solid foods.   3) Please notify  your doctor immediately if you have any unusual bleeding, trouble breathing, redness and pain at the surgery site, drainage, fever, or pain not relieved by medication.   Please contact your physician with any problems or Same Day Surgery at [phone], Monday through Friday 6 am to 4 pm, or [place] [facility] Main number at [phone].

## 2016-10-26 NOTE — Anesthesia Procedure Notes (Signed)
Procedure Name: Intubation Date/Time: 10/26/2016 8:55 AM Performed by: Allean Found Pre-anesthesia Checklist: Patient identified, Emergency Drugs available, Suction available, Patient being monitored and Timeout performed Patient Re-evaluated:Patient Re-evaluated prior to inductionOxygen Delivery Method: Circle system utilized Preoxygenation: Pre-oxygenation with 100% oxygen Intubation Type: IV induction Ventilation: Mask ventilation without difficulty Laryngoscope Size: Mac and 3 Grade View: Grade I Tube type: Oral Tube size: 7.5 mm Number of attempts: 1 Airway Equipment and Method: Stylet Placement Confirmation: ETT inserted through vocal cords under direct vision,  positive ETCO2 and breath sounds checked- equal and bilateral Secured at: 21 cm Tube secured with: Tape Dental Injury: Teeth and Oropharynx as per pre-operative assessment

## 2016-10-26 NOTE — Anesthesia Post-op Follow-up Note (Cosign Needed)
Anesthesia QCDR form completed.        

## 2016-10-26 NOTE — Telephone Encounter (Signed)
-----   Message from Jerilee FieldMatthew Eskridge, MD sent at 10/21/2016  3:47 AM EDT ----- Notify patient the two stones are still present in the left ureter based on the KUB and we need to proceed with the camera look/scope and laser of the stones in the left ureter.   Notify Amy he needs to be scheduled for the following: Left ureteroscopy, holmium laser lithotripsy and stent Antibiotics - levaquin 500 mg IV SCD's Anesthesia: Gen Labs, cxr, ekg per anesthesia   Thanks, Dr Bea LauraE   ----- Message ----- From: Ellin GoodieLowe, Casandra S, CMA Sent: 10/20/2016   9:55 AM To: Jerilee FieldMatthew Eskridge, MD    ----- Message ----- From: Interface, Rad Results In Sent: 10/19/2016   1:42 PM To: Jennette [facility]

## 2016-10-26 NOTE — H&P (View-Only) (Signed)
10/19/2016 11:06 AM   Bradley [name] [birth year] [id number]  Referring provider: Kandyce Rud, MD [phone] [facility] [place], [place] [address]  Chief Complaint  Patient presents with  . Establish Care    HPI: Pt seen for two issues.   1) kidney stones - pt has a h/o stones. H/o ureteral stent and ESWL. He was seen 09/09/2016 when a CT scan was done and revealed 2 left proximal 9 mm stones. They were visible on the scout. Other very small scattered stones in the kidneys. He also had bilateral parapelvic cysts which will make [place] f/u for hydro more difficult. He has not seen the stone pass but has had no further pain or hematuria. The pain resolved after an episode of urgency, frequency and weak stream. Also he had 4-10 rbc's on UA when passing the stones and today UA clear.   2) BPH with lower urinary tract symptoms-patient complaining of weak stream, intermittent flow, nocturia, urgency and frequency. On CT scan his prostate was 5.5 x 6.5 x 5.2 cm = 95 grams. He has a h/o BPH. He has increased his fluid intake and is voiding better. He is on finasteride and tamsulosin. PSA 2.9 - 3 in 2014 (or 6 ). He's been on finasteride for years.    PMH: Past Medical History:  Diagnosis Date  . BPH (benign prostatic hypertrophy)   . Diabetes type 2, controlled (HCC)   . Glaucoma    [place]  . Hearing loss    bilateral hearing aids occasionally  . History of kidney stones   . HLD (hyperlipidemia)   . HTN (hypertension)   . Macular degeneration    Right ([place])  . Osteoarthritis    thumbs, hips, knees (s/p R TKR)  . PUD (peptic ulcer disease) 03/2013   2 antral gastric ulcers and 1 duodenal ulcer, NSAID related, admitted to [facility] with melena/anemia s/p EGD  . Tremor    intention    Surgical History: Past Surgical History:  Procedure Laterality Date  . BELPHAROPTOSIS REPAIR  2012  . carotid US  12/2011   no significant stenosis    . CATARACT EXTRACTION Bilateral 2011, 2012  . CHOLECYSTECTOMY  1997  . COLONOSCOPY  2009   per patient, told no longer needed  . ESOPHAGOGASTRODUODENOSCOPY  04/2013   hospitalization @ [facility], gastritis, gastric ulcers and duodenal ulcer with clean base ([place])  . KIDNEY STONE SURGERY  83, 87, 90  . TOTAL KNEE ARTHROPLASTY Right 2006  . US ECHOCARDIOGRAPHY  12/2011   nl LV fxn, EF 60%, nl valves, dilated aortic root (40mm)    Home Medications:  Allergies as of 10/19/2016      Reactions   Sulfa Antibiotics    As infant   Penicillins Rash      Medication List       Accurate as of 10/19/16 11:06 AM. Always use your most recent med list.          aspirin 81 MG tablet Take 81 mg by mouth daily.   atorvastatin 20 MG tablet Commonly known as:  LIPITOR Take 20 mg by mouth at bedtime.   Biotin 1000 MCG Chew Chew by mouth.   COMBIGAN 0.2-0.5 % ophthalmic solution Generic drug:  brimonidine-timolol Place 1 drop into both eyes every 12 (twelve) hours.   finasteride 5 MG tablet Commonly known as:  PROSCAR Take 5 mg by mouth daily.   glimepiride 4 MG tablet Commonly known as:  AMARYL Take 8 mg by mouth daily before breakfast. BRAND   metFORMIN 500 MG tablet Commonly known as:  GLUCOPHAGE Take by mouth 2 (two) times daily with a meal.   pantoprazole 40 MG tablet Commonly known as:  PROTONIX Take 1 tablet (40 mg total) by mouth daily.   PRESERVISION AREDS 2 PO Take 1 tablet by mouth 2 (two) times daily.   tamsulosin 0.4 MG Caps capsule Commonly known as:  FLOMAX Take 0.4 mg by mouth daily.   valsartan-hydrochlorothiazide 160-25 MG tablet Commonly known as:  DIOVAN-HCT Takes 1/2 tablet by mouth daily       Allergies:  Allergies  Allergen Reactions  . Sulfa Antibiotics     As infant  . Penicillins Rash    Family History: Family History  Problem Relation Age of Onset  . Cancer Sister        lung, nonsmoker  . Cancer Sister        lung, nonsmoker  .  Dementia Mother   . CAD Father        "heart problems"  . CAD Brother 3162       MI  . Stroke Neg Hx   . Prostate cancer Neg Hx   . Bladder Cancer Neg Hx   . Kidney cancer Neg Hx     Social History:  reports that he has quit smoking. His smoking use included Cigarettes and Cigars. He started smoking about 18 years ago. He has a 45.00 pack-year smoking history. He has never used smokeless tobacco. He reports that he drinks alcohol. He reports that he does not use drugs.  ROS: UROLOGY Frequent Urination?: Yes Hard to postpone urination?: Yes Burning/pain with urination?: No Get up at night to urinate?: Yes Leakage of urine?: Yes Urine stream starts and stops?: Yes Trouble starting stream?: No Do you have to strain to urinate?: No Blood in urine?: No Urinary tract infection?: No Sexually transmitted disease?: No Injury to kidneys or bladder?: No Painful intercourse?: No Weak stream?: Yes Erection problems?: Yes Penile pain?: No  Gastrointestinal Nausea?: No Vomiting?: No Indigestion/heartburn?: No Diarrhea?: No Constipation?: No  Constitutional Fever: No Night sweats?: No Weight loss?: No Fatigue?: Yes  Skin Skin rash/lesions?: No Itching?: No  Eyes Blurred vision?: Yes Double vision?: Yes  Ears/Nose/Throat Sore throat?: No Sinus problems?: No  Hematologic/Lymphatic Swollen glands?: No Easy bruising?: Yes  Cardiovascular Leg swelling?: Yes Chest pain?: No  Respiratory Cough?: No Shortness of breath?: No  Endocrine Excessive thirst?: No  Musculoskeletal Back pain?: No Joint pain?: Yes  Neurological Headaches?: No Dizziness?: No  Psychologic Depression?: No Anxiety?: No  Physical Exam: BP 122/72 (BP Location: Left Arm, Patient Position: Sitting, Cuff Size: Normal)   Pulse 70   Ht 5\' 9"  (1.753 m)   Wt 101.4 kg (223 lb 8 oz)   BMI 33.01 kg/m   Constitutional:  Alert and oriented, No acute distress. HEENT: [place] AT, moist mucus membranes.   Trachea midline, no masses. Cardiovascular: No clubbing, cyanosis, or edema. Respiratory: Normal respiratory effort, no increased work of breathing. GI: Abdomen is soft, nontender, nondistended, no abdominal masses GU: No CVA tenderness.  DRE - prostate 50 g, smooth, no hard area or nodule Skin: No rashes, bruises or suspicious lesions. Lymph: No cervical or inguinal adenopathy. Neurologic: Grossly intact, no focal deficits, moving all 4 extremities. Psychiatric: Normal mood and affect.  Laboratory Data: Lab Results  Component Value Date   WBC 6.4 04/23/2013   HGB 9.3 (L) 04/23/2013   HCT  27.5 (L) 04/23/2013   MCV 94 04/23/2013   PLT 164 04/23/2013    Lab Results  Component Value Date   CREATININE 0.68 04/23/2013    Lab Results  Component Value Date   PSA 3.14 02/28/2013   PSA 2.9 12/01/2011    No results found for: TESTOSTERONE  Lab Results  Component Value Date   HGBA1C 7.1 (H) 02/28/2013    Urinalysis    Component Value Date/Time   COLORURINE Yellow 04/22/2013 1032   APPEARANCEUR Clear 04/22/2013 1032   LABSPEC 1.020 04/22/2013 1032   PHURINE 5.0 04/22/2013 1032   GLUCOSEU Negative 04/22/2013 1032   HGBUR Negative 04/22/2013 1032   BILIRUBINUR Negative 04/22/2013 1032   KETONESUR Negative 04/22/2013 1032   PROTEINUR Negative 04/22/2013 1032   NITRITE Negative 04/22/2013 1032   LEUKOCYTESUR Negative 04/22/2013 1032    Pertinent Imaging: CT scan   Assessment & Plan:    1. Kidney stones - check KUB. Discussed nature r/b of continued surveillance, ESWL or URS. If two stones present he may need URS and we discussed staged procedure. He likely passed the stones given the pain and LUTS resolved and his UA cleared.   - Urinalysis, Complete  2. BPH with LUTS - I discussed with the patient and his wife the nature of prostate procedures. He'll continue medications.    3) PSA elevation - PSA would correct to about 6 and we discussed the nature of PSA  elevation and nature r/b of continued surveillance vs prostate bx. All questions answered. He'll continue surveillance.   No Follow-up on file.  Jerilee Field, MD  [facility] [address], [address] [place], [place] [address] [phone]

## 2016-10-26 NOTE — Transfer of Care (Signed)
Immediate Anesthesia Transfer of Care Note  Patient: Bradley [name]  Procedure(s) Performed: Procedure(s): URETEROSCOPY (Left) CYSTOSCOPY WITH STENT PLACEMENT (Left)  Patient Location: PACU  Anesthesia Type:General  Level of Consciousness: awake  Airway & Oxygen Therapy: Patient Spontanous Breathing and Patient connected to face mask oxygen  Post-op Assessment: Report given to RN and Post -op Vital signs reviewed and stable  Post vital signs: Reviewed and stable  Last Vitals:  Vitals:   10/26/16 0745 10/26/16 0956  BP: 139/85 (!) 144/81  Pulse: 83 77  Resp: 16 14  Temp: 36.9 C 36.4 C    Last Pain:  Vitals:   10/26/16 0745  TempSrc: Tympanic         Complications: No apparent anesthesia complications

## 2016-10-26 NOTE — Interval H&P Note (Signed)
History and Physical Interval Note:  10/26/2016 8:36 AM  Bradley [name] Ihde  has presented today for surgery, with the diagnosis of LEFT URETERAL STONE  The various methods of treatment have been discussed with the patient and family. After consideration of risks, benefits and other options for treatment, the patient has consented to  Procedure(s): URETEROSCOPY WITH HOLMIUM LASER LITHOTRIPSY (Left) CYSTOSCOPY WITH STENT PLACEMENT (Left) as a surgical intervention .  The patient's history has been reviewed, patient examined, no change in status, stable for surgery.  I have reviewed the patient's chart and labs.  Questions were answered to the patient's satisfaction.    RRR CTAB  Bradley [name]

## 2016-10-27 ENCOUNTER — Other Ambulatory Visit: Payer: Self-pay | Admitting: Radiology

## 2016-10-27 DIAGNOSIS — N201 Calculus of ureter: Secondary | ICD-10-CM

## 2016-10-27 DIAGNOSIS — N400 Enlarged prostate without lower urinary tract symptoms: Secondary | ICD-10-CM

## 2016-10-27 NOTE — Telephone Encounter (Signed)
Notified pt's wife of HOLEP with possible left ureteroscopy/laser lithotripsy scheduled with Dr Apolinar JunesBrandon on 11/01/16 & to call Friday prior to surgery for arrival time to [facility]. Advised pt to be npo after mn & to follow instructions given at pre-admit testing for prior surgery with Dr Apolinar JunesBrandon. Questions were answered to wife's satisfaction. No further questions at this time. Wife voices understanding.

## 2016-11-01 ENCOUNTER — Ambulatory Visit
Admission: RE | Admit: 2016-11-01 | Discharge: 2016-11-01 | Disposition: A | Discharge status: Home / Self Care | Payer: Medicare Other | Source: Ambulatory Visit | Attending: Urology | Admitting: Urology

## 2016-11-01 ENCOUNTER — Ambulatory Visit: Payer: Medicare Other | Admitting: Anesthesiology

## 2016-11-01 ENCOUNTER — Telehealth: Payer: Self-pay | Admitting: Urology

## 2016-11-01 ENCOUNTER — Encounter
Admission: RE | Disposition: A | Discharge status: Home / Self Care | Payer: Self-pay | Source: Ambulatory Visit | Attending: Urology

## 2016-11-01 DIAGNOSIS — Z7982 Long term (current) use of aspirin: Secondary | ICD-10-CM | POA: Diagnosis not present

## 2016-11-01 DIAGNOSIS — R972 Elevated prostate specific antigen [PSA]: Secondary | ICD-10-CM | POA: Diagnosis not present

## 2016-11-01 DIAGNOSIS — N201 Calculus of ureter: Secondary | ICD-10-CM

## 2016-11-01 DIAGNOSIS — R350 Frequency of micturition: Secondary | ICD-10-CM | POA: Diagnosis not present

## 2016-11-01 DIAGNOSIS — N400 Enlarged prostate without lower urinary tract symptoms: Secondary | ICD-10-CM

## 2016-11-01 DIAGNOSIS — Z7984 Long term (current) use of oral hypoglycemic drugs: Secondary | ICD-10-CM | POA: Diagnosis not present

## 2016-11-01 DIAGNOSIS — K219 Gastro-esophageal reflux disease without esophagitis: Secondary | ICD-10-CM | POA: Diagnosis not present

## 2016-11-01 DIAGNOSIS — E119 Type 2 diabetes mellitus without complications: Secondary | ICD-10-CM | POA: Diagnosis not present

## 2016-11-01 DIAGNOSIS — R3912 Poor urinary stream: Secondary | ICD-10-CM | POA: Insufficient documentation

## 2016-11-01 DIAGNOSIS — E785 Hyperlipidemia, unspecified: Secondary | ICD-10-CM | POA: Diagnosis not present

## 2016-11-01 DIAGNOSIS — N200 Calculus of kidney: Secondary | ICD-10-CM | POA: Diagnosis present

## 2016-11-01 DIAGNOSIS — R351 Nocturia: Secondary | ICD-10-CM | POA: Diagnosis not present

## 2016-11-01 DIAGNOSIS — I10 Essential (primary) hypertension: Secondary | ICD-10-CM | POA: Diagnosis not present

## 2016-11-01 DIAGNOSIS — Z87891 Personal history of nicotine dependence: Secondary | ICD-10-CM | POA: Diagnosis not present

## 2016-11-01 DIAGNOSIS — Z79899 Other long term (current) drug therapy: Secondary | ICD-10-CM | POA: Diagnosis not present

## 2016-11-01 DIAGNOSIS — G4730 Sleep apnea, unspecified: Secondary | ICD-10-CM | POA: Insufficient documentation

## 2016-11-01 DIAGNOSIS — R3915 Urgency of urination: Secondary | ICD-10-CM | POA: Insufficient documentation

## 2016-11-01 DIAGNOSIS — N401 Enlarged prostate with lower urinary tract symptoms: Secondary | ICD-10-CM

## 2016-11-01 DIAGNOSIS — Z96651 Presence of right artificial knee joint: Secondary | ICD-10-CM | POA: Insufficient documentation

## 2016-11-01 HISTORY — PX: HOLEP-LASER ENUCLEATION OF THE PROSTATE WITH MORCELLATION: SHX6641

## 2016-11-01 HISTORY — PX: URETEROSCOPY WITH HOLMIUM LASER LITHOTRIPSY: SHX6645

## 2016-11-01 HISTORY — PX: CYSTOSCOPY W/ URETERAL STENT PLACEMENT: SHX1429

## 2016-11-01 LAB — GLUCOSE, CAPILLARY
GLUCOSE-CAPILLARY: 210 mg/dL — AB (ref 65–99)
Glucose-Capillary: 194 mg/dL — ABNORMAL HIGH (ref 65–99)

## 2016-11-01 SURGERY — ENUCLEATION, PROSTATE, USING LASER, WITH MORCELLATION
Anesthesia: General | Site: Urethra | Wound class: Clean

## 2016-11-01 MED ORDER — HYDROCODONE-ACETAMINOPHEN 5-325 MG PO TABS
ORAL_TABLET | ORAL | Status: AC
  Administered 2016-11-01: 1 via ORAL
  Filled 2016-11-01: qty 1

## 2016-11-01 MED ORDER — FENTANYL CITRATE (PF) 100 MCG/2ML IJ SOLN
INTRAMUSCULAR | Status: AC
  Filled 2016-11-01: qty 2

## 2016-11-01 MED ORDER — CIPROFLOXACIN IN D5W 400 MG/200ML IV SOLN
400.0000 mg | INTRAVENOUS | Status: AC
  Administered 2016-11-01: 400 mg via INTRAVENOUS

## 2016-11-01 MED ORDER — SODIUM CHLORIDE 0.9 % IV SOLN
INTRAVENOUS | Status: DC
  Administered 2016-11-01 (×2): via INTRAVENOUS

## 2016-11-01 MED ORDER — SUGAMMADEX SODIUM 200 MG/2ML IV SOLN
INTRAVENOUS | Status: DC | PRN
  Administered 2016-11-01: 202.4 mg via INTRAVENOUS

## 2016-11-01 MED ORDER — FAMOTIDINE 20 MG PO TABS
20.0000 mg | ORAL_TABLET | Freq: Once | ORAL | Status: AC
  Administered 2016-11-01: 20 mg via ORAL

## 2016-11-01 MED ORDER — PROPOFOL 10 MG/ML IV BOLUS
INTRAVENOUS | Status: AC
  Filled 2016-11-01: qty 20

## 2016-11-01 MED ORDER — ONDANSETRON HCL 4 MG/2ML IJ SOLN: 4.0000 mg | Freq: Once | INTRAMUSCULAR | Status: DC | PRN

## 2016-11-01 MED ORDER — FENTANYL CITRATE (PF) 100 MCG/2ML IJ SOLN
INTRAMUSCULAR | Status: AC
  Administered 2016-11-01: 25 ug via INTRAVENOUS
  Filled 2016-11-01: qty 2

## 2016-11-01 MED ORDER — GENTAMICIN IN SALINE 1.6-0.9 MG/ML-% IV SOLN
80.0000 mg | INTRAVENOUS | Status: AC
  Administered 2016-11-01: 80 mg via INTRAVENOUS
  Filled 2016-11-01 (×2): qty 50

## 2016-11-01 MED ORDER — PHENYLEPHRINE HCL 10 MG/ML IJ SOLN
INTRAMUSCULAR | Status: DC | PRN
  Administered 2016-11-01: 50 ug via INTRAVENOUS
  Administered 2016-11-01 (×3): 100 ug via INTRAVENOUS

## 2016-11-01 MED ORDER — LIDOCAINE HCL (CARDIAC) 20 MG/ML IV SOLN
INTRAVENOUS | Status: DC | PRN
  Administered 2016-11-01: 100 mg via INTRAVENOUS

## 2016-11-01 MED ORDER — SEVOFLURANE IN SOLN
RESPIRATORY_TRACT | Status: AC
Start: 2016-11-01 — End: ?
  Filled 2016-11-01: qty 250

## 2016-11-01 MED ORDER — FENTANYL CITRATE (PF) 100 MCG/2ML IJ SOLN
25.0000 ug | INTRAMUSCULAR | Status: DC | PRN
  Administered 2016-11-01: 25 ug via INTRAVENOUS

## 2016-11-01 MED ORDER — HYDROCODONE-ACETAMINOPHEN 5-325 MG PO TABS
1.0000 | ORAL_TABLET | Freq: Four times a day (QID) | ORAL | Status: DC | PRN
  Administered 2016-11-01: 1 via ORAL

## 2016-11-01 MED ORDER — LIDOCAINE HCL (PF) 2 % IJ SOLN
INTRAMUSCULAR | Status: AC
  Filled 2016-11-01: qty 2

## 2016-11-01 MED ORDER — ONDANSETRON HCL 4 MG/2ML IJ SOLN
INTRAMUSCULAR | Status: AC
  Filled 2016-11-01: qty 2

## 2016-11-01 MED ORDER — FUROSEMIDE 10 MG/ML IJ SOLN
INTRAMUSCULAR | Status: AC
  Filled 2016-11-01: qty 4

## 2016-11-01 MED ORDER — HYDROCODONE-ACETAMINOPHEN 5-325 MG PO TABS: 1.0000 | ORAL_TABLET | Freq: Four times a day (QID) | ORAL | 0 refills | Status: DC | PRN

## 2016-11-01 MED ORDER — FAMOTIDINE 20 MG PO TABS
ORAL_TABLET | ORAL | Status: AC
  Filled 2016-11-01: qty 1

## 2016-11-01 MED ORDER — IOTHALAMATE MEGLUMINE 43 % IV SOLN
INTRAVENOUS | Status: DC | PRN
  Administered 2016-11-01: 10 mL

## 2016-11-01 MED ORDER — FUROSEMIDE 10 MG/ML IJ SOLN
INTRAMUSCULAR | Status: DC | PRN
  Administered 2016-11-01: 10 mg via INTRAVENOUS

## 2016-11-01 MED ORDER — ROCURONIUM BROMIDE 100 MG/10ML IV SOLN
INTRAVENOUS | Status: DC | PRN
  Administered 2016-11-01 (×2): 10 mg via INTRAVENOUS
  Administered 2016-11-01: 40 mg via INTRAVENOUS
  Administered 2016-11-01 (×4): 10 mg via INTRAVENOUS

## 2016-11-01 MED ORDER — FENTANYL CITRATE (PF) 100 MCG/2ML IJ SOLN
INTRAMUSCULAR | Status: DC | PRN
  Administered 2016-11-01 (×5): 50 ug via INTRAVENOUS

## 2016-11-01 MED ORDER — CIPROFLOXACIN IN D5W 400 MG/200ML IV SOLN
INTRAVENOUS | Status: AC
  Filled 2016-11-01: qty 200

## 2016-11-01 MED ORDER — ROCURONIUM BROMIDE 50 MG/5ML IV SOLN
INTRAVENOUS | Status: AC
  Filled 2016-11-01: qty 2

## 2016-11-01 MED ORDER — PROPOFOL 10 MG/ML IV BOLUS
INTRAVENOUS | Status: DC | PRN
Start: 2016-11-01 — End: 2016-11-01
  Administered 2016-11-01: 200 mg via INTRAVENOUS

## 2016-11-01 SURGICAL SUPPLY — 50 items
ADAPTER IRRIG TUBE 2 SPIKE SOL (ADAPTER) ×8 IMPLANT
BAG DRAIN CYSTO-URO LG1000N (MISCELLANEOUS) ×4 IMPLANT
BAG URO DRAIN 2000ML W/SPOUT (MISCELLANEOUS) ×4 IMPLANT
BAG URO DRAIN 4000ML (MISCELLANEOUS) ×4 IMPLANT
BASKET STONE 1.9X4X120X12 (MISCELLANEOUS) IMPLANT
BASKET ZERO TIP 1.9FR (BASKET) ×4 IMPLANT
CATH FOL 2WAY LX 20X30 (CATHETERS) ×4 IMPLANT
CATH FOL 2WAY LX 22X30 (CATHETERS) IMPLANT
CATH FOL LEG HOLDER (MISCELLANEOUS) IMPLANT
CATH FOLEY 2WAY SIL 22X30 (CATHETERS) ×4 IMPLANT
CATH FOLEY 3WAY 30CC 22FR (CATHETERS) IMPLANT
CATH URETL 5X70 OPEN END (CATHETERS) ×4 IMPLANT
CNTNR SPEC 2.5X3XGRAD LEK (MISCELLANEOUS)
CONRAY 43 FOR UROLOGY 50M (MISCELLANEOUS) ×4 IMPLANT
CONT SPEC 4OZ STER OR WHT (MISCELLANEOUS)
CONTAINER COLLECT MORCELLATR (MISCELLANEOUS) ×2 IMPLANT
CONTAINER SPEC 2.5X3XGRAD LEK (MISCELLANEOUS) IMPLANT
DRAPE SHEET LG 3/4 BI-LAMINATE (DRAPES) ×4 IMPLANT
DRAPE UTILITY 15X26 TOWEL STRL (DRAPES) ×4 IMPLANT
DRESSING ALLEVYN LIFE SACRUM (GAUZE/BANDAGES/DRESSINGS) ×4 IMPLANT
FIBER LASER LITHO 273 (Laser) ×4 IMPLANT
FILTER OVERFLOW MORCELLATOR (FILTER) ×2 IMPLANT
GLOVE BIO SURGEON STRL SZ 6.5 (GLOVE) ×6 IMPLANT
GLOVE BIO SURGEONS STRL SZ 6.5 (GLOVE) ×2
GOWN STRL REUS W/ TWL LRG LVL3 (GOWN DISPOSABLE) ×4 IMPLANT
GOWN STRL REUS W/TWL LRG LVL3 (GOWN DISPOSABLE) ×4
GUIDEWIRE GREEN .038 145CM (MISCELLANEOUS) IMPLANT
INFUSOR MANOMETER BAG 3000ML (MISCELLANEOUS) ×4 IMPLANT
INTRODUCER DILATOR DOUBLE (INTRODUCER) IMPLANT
KIT RM TURNOVER CYSTO AR (KITS) ×4 IMPLANT
LASER FIBER 550M SMARTSCOPE (Laser) ×4 IMPLANT
MORCELLATOR COLLECT CONTAINER (MISCELLANEOUS) ×4
MORCELLATOR OVERFLOW FILTER (FILTER) ×4
MORCELLATOR ROTATION 4.75 335 (MISCELLANEOUS) ×4 IMPLANT
PACK CYSTO AR (MISCELLANEOUS) ×4 IMPLANT
SCRUB POVIDONE IODINE 4 OZ (MISCELLANEOUS) IMPLANT
SENSORWIRE 0.038 NOT ANGLED (WIRE) ×8
SET CYSTO W/LG BORE CLAMP LF (SET/KITS/TRAYS/PACK) ×4 IMPLANT
SET IRRIG Y TYPE TUR BLADDER L (SET/KITS/TRAYS/PACK) ×4 IMPLANT
SHEATH URETERAL 12FRX35CM (MISCELLANEOUS) ×4 IMPLANT
SLEEVE PROTECTION STRL DISP (MISCELLANEOUS) ×8 IMPLANT
SOL .9 NS 3000ML IRR  AL (IV SOLUTION) ×38
SOL .9 NS 3000ML IRR UROMATIC (IV SOLUTION) ×38 IMPLANT
STENT URET 6FRX24 CONTOUR (STENTS) IMPLANT
STENT URET 6FRX26 CONTOUR (STENTS) ×4 IMPLANT
SURGILUBE 2OZ TUBE FLIPTOP (MISCELLANEOUS) ×4 IMPLANT
SYRINGE IRR TOOMEY STRL 70CC (SYRINGE) ×4 IMPLANT
TUBE PUMP MORCELLATOR PIRANHA (TUBING) ×4 IMPLANT
WATER STERILE IRR 1000ML POUR (IV SOLUTION) ×4 IMPLANT
WIRE SENSOR 0.038 NOT ANGLED (WIRE) ×4 IMPLANT

## 2016-11-01 NOTE — Telephone Encounter (Signed)
Done ° ° °Bradley [name] °

## 2016-11-01 NOTE — Anesthesia Procedure Notes (Signed)
Procedure Name: Intubation Performed by: Demetrius Charity Pre-anesthesia Checklist: Patient identified, Patient being monitored, Timeout performed, Emergency Drugs available and Suction available Patient Re-evaluated:Patient Re-evaluated prior to inductionOxygen Delivery Method: Circle system utilized Preoxygenation: Pre-oxygenation with 100% oxygen Intubation Type: IV induction Ventilation: Mask ventilation without difficulty and Oral airway inserted - appropriate to patient size Laryngoscope Size: Mac and 3 Grade View: Grade I Tube type: Oral Tube size: 7.0 mm Number of attempts: 1 Airway Equipment and Method: Stylet Placement Confirmation: ETT inserted through vocal cords under direct vision,  positive ETCO2 and breath sounds checked- equal and bilateral Secured at: 21 cm Tube secured with: Tape Dental Injury: Teeth and Oropharynx as per pre-operative assessment

## 2016-11-01 NOTE — Interval H&P Note (Signed)
History and Physical Interval Note:  11/01/2016 9:46 AM  Bradley [name]  has presented today for surgery, with the diagnosis of BPH,LEFT URETERAL STONE  The various methods of treatment have been discussed with the patient and family. After consideration of risks, benefits and other options for treatment, the patient has consented to  Procedure(s): HOLEP-LASER ENUCLEATION OF THE PROSTATE WITH MORCELLATION (N/A) URETEROSCOPY WITH HOLMIUM LASER LITHOTRIPSY (Left) CYSTOSCOPY WITH STENT REPLACEMENT (Left) as a surgical intervention .  The patient's history has been reviewed, patient examined, no change in status, stable for surgery.  I have reviewed the patient's chart and labs.  Questions were answered to the patient's satisfaction.    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 He does have a history of voiding symptoms and is currently on maximal medical therapy. As such, I have offered him holmium laser enucleation of his median lobe to help with his voiding symptoms as well as to facilitate ureteroscopy. He was referred agreeable with this plan.  RRR CTAB   Bradley [name]

## 2016-11-01 NOTE — Transfer of Care (Signed)
Immediate Anesthesia Transfer of Care Note  Patient: Christinia Gullyonald Parrella  Procedure(s) Performed: Procedure(s): HOLEP-LASER ENUCLEATION OF THE PROSTATE WITH MORCELLATION (N/A) URETEROSCOPY WITH HOLMIUM LASER LITHOTRIPSY (Left) CYSTOSCOPY WITH STENT REPLACEMENT (Left)  Patient Location: PACU  Anesthesia Type:General  Level of Consciousness: sedated  Airway & Oxygen Therapy: Patient Spontanous Breathing and Patient connected to face mask oxygen  Post-op Assessment: Report given to RN and Post -op Vital signs reviewed and stable  Post vital signs: Reviewed and stable  Last Vitals:  Vitals:   11/01/16 0919  BP: (!) 115/91  Pulse: 80  Resp: 18  Temp: 36.6 C    Last Pain:  Vitals:   11/01/16 0919  TempSrc: Oral  PainSc: 0-No pain         Complications: No apparent anesthesia complications

## 2016-11-01 NOTE — Anesthesia Post-op Follow-up Note (Cosign Needed)
Anesthesia QCDR form completed.        

## 2016-11-01 NOTE — Discharge Instructions (Signed)
Indwelling Urinary Catheter Care, Adult  Take good care of your catheter to keep it working and to prevent problems.  How to wear your catheter  Attach your catheter to your leg with tape (adhesive tape) or a leg strap. Make sure it is not too tight. If you use tape, remove any bits of tape that are already on the catheter.  How to wear a drainage bag  You should have:   A large overnight bag.   A small leg bag.    Overnight Bag  You may wear the overnight bag at any time. Always keep the bag below the level of your bladder but off the floor. When you sleep, put a clean plastic bag in a wastebasket. Then hang the bag inside the wastebasket.  Leg Bag  Never wear the leg bag at night. Always wear the leg bag below your knee. Keep the leg bag secure with a leg strap or tape.  How to care for your skin   Clean the skin around the catheter at least once every day.   Shower every day. Do not take baths.   Put creams, lotions, or ointments on your genital area only as told by your doctor.   Do not use powders, sprays, or lotions on your genital area.  How to clean your catheter and your skin  1. Wash your hands with soap and water.  2. Wet a washcloth in warm water and gentle (mild) soap.  3. Use the washcloth to clean the skin where the catheter enters your body. Clean downward and wipe away from the catheter in small circles. Do not wipe toward the catheter.  4. Pat the area dry with a clean towel. Make sure to clean off all soap.  How to care for your drainage bags  Empty your drainage bag when it is ?- full or at least 2-3 times a day. Replace your drainage bag once a month or sooner if it starts to smell bad or look dirty. Do not clean your drainage bag unless told by your doctor.  Emptying a drainage bag    Supplies Needed   Rubbing alcohol.   Gauze pad or cotton ball.   Tape or a leg strap.    Steps  1. Wash your hands with soap and water.  2. Separate (detach) the bag from your leg.  3. Hold the bag over  the toilet or a clean container. Keep the bag below your hips and bladder. This stops pee (urine) from going back into the tube.  4. Open the pour spout at the bottom of the bag.  5. Empty the pee into the toilet or container. Do not let the pour spout touch any surface.  6. Put rubbing alcohol on a gauze pad or cotton ball.  7. Use the gauze pad or cotton ball to clean the pour spout.  8. Close the pour spout.  9. Attach the bag to your leg with tape or a leg strap.  10. Wash your hands.    Changing a drainage bag  Supplies Needed   Alcohol wipes.   A clean drainage bag.   Adhesive tape or a leg strap.    Steps  1. Wash your hands with soap and water.  2. Separate the dirty bag from your leg.  3. Pinch the rubber catheter with your fingers so that pee does not spill out.  4. Separate the catheter tube from the drainage tube where these tubes connect (at the   connection valve). Do not let the tubes touch any surface.  5. Clean the end of the catheter tube with an alcohol wipe. Use a different alcohol wipe to clean the end of the drainage tube.  6. Connect the catheter tube to the drainage tube of the clean bag.  7. Attach the new bag to the leg with adhesive tape or a leg strap.  8. Wash your hands.    How to prevent infection and other problems   Never pull on your catheter or try to remove it. Pulling can damage tissue in your body.   Always wash your hands before and after touching your catheter.   If a leg strap gets wet, replace it with a dry one.   Drink enough fluids to keep your pee clear or pale yellow, or as told by your doctor.   Do not let the drainage bag or tubing touch the floor.   Wear cotton underwear.   If you are male, wipe from front to back after you poop (have a bowel movement).   Check on the catheter often to make sure it works and the tubing is not twisted.  Get help if:   Your pee is cloudy.   Your pee smells unusually bad.   Your pee is not draining into the bag.   Your  tube gets clogged.   Your catheter starts to leak.   Your bladder feels full.  Get help right away if:   You have redness, swelling, or pain where the catheter enters your body.   You have fluid, pus, or a bad smell coming from the area where the catheter enters your body.   The area where the catheter enters your body feels warm.   You have a fever.   You have pain in your:  ? Stomach (abdomen).  ? Legs.  ? Lower back.  ? Bladder.   You see blood fill the catheter.   Your pee is pink or red.   You feel sick to your stomach (nauseous).   You throw up (vomit).   You have chills.   Your catheter gets pulled out.  This information is not intended to replace advice given to you by your health care provider. Make sure you discuss any questions you have with your health care provider.  Document Released: 09/04/2012 Document Revised: 04/07/2016 Document Reviewed: 10/23/2013  [employer] Interactive Patient Education  2018 [employer]

## 2016-11-01 NOTE — H&P (View-Only) (Signed)
10/19/2016 11:06 AM   Bradley [name] [birth year] [id number]  Referring provider: Kandyce Rud, MD [phone] [facility] [place], [place] [address]  Chief Complaint  Patient presents with  . Establish Care    HPI: Pt seen for two issues.   1) kidney stones - pt has a h/o stones. H/o ureteral stent and ESWL. He was seen 09/09/2016 when a CT scan was done and revealed 2 left proximal 9 mm stones. They were visible on the scout. Other very small scattered stones in the kidneys. He also had bilateral parapelvic cysts which will make [place] f/u for hydro more difficult. He has not seen the stone pass but has had no further pain or hematuria. The pain resolved after an episode of urgency, frequency and weak stream. Also he had 4-10 rbc's on UA when passing the stones and today UA clear.   2) BPH with lower urinary tract symptoms-patient complaining of weak stream, intermittent flow, nocturia, urgency and frequency. On CT scan his prostate was 5.5 x 6.5 x 5.2 cm = 95 grams. He has a h/o BPH. He has increased his fluid intake and is voiding better. He is on finasteride and tamsulosin. PSA 2.9 - 3 in 2014 (or 6 ). He's been on finasteride for years.    PMH: Past Medical History:  Diagnosis Date  . BPH (benign prostatic hypertrophy)   . Diabetes type 2, controlled (HCC)   . Glaucoma    [place]  . Hearing loss    bilateral hearing aids occasionally  . History of kidney stones   . HLD (hyperlipidemia)   . HTN (hypertension)   . Macular degeneration    Right ([place])  . Osteoarthritis    thumbs, hips, knees (s/p R TKR)  . PUD (peptic ulcer disease) 03/2013   2 antral gastric ulcers and 1 duodenal ulcer, NSAID related, admitted to [facility] with melena/anemia s/p EGD  . Tremor    intention    Surgical History: Past Surgical History:  Procedure Laterality Date  . BELPHAROPTOSIS REPAIR  2012  . carotid US  12/2011   no significant stenosis    . CATARACT EXTRACTION Bilateral 2011, 2012  . CHOLECYSTECTOMY  1997  . COLONOSCOPY  2009   per patient, told no longer needed  . ESOPHAGOGASTRODUODENOSCOPY  04/2013   hospitalization @ [facility], gastritis, gastric ulcers and duodenal ulcer with clean base ([place])  . KIDNEY STONE SURGERY  83, 87, 90  . TOTAL KNEE ARTHROPLASTY Right 2006  . US ECHOCARDIOGRAPHY  12/2011   nl LV fxn, EF 60%, nl valves, dilated aortic root (40mm)    Home Medications:  Allergies as of 10/19/2016      Reactions   Sulfa Antibiotics    As infant   Penicillins Rash      Medication List       Accurate as of 10/19/16 11:06 AM. Always use your most recent med list.          aspirin 81 MG tablet Take 81 mg by mouth daily.   atorvastatin 20 MG tablet Commonly known as:  LIPITOR Take 20 mg by mouth at bedtime.   Biotin 1000 MCG Chew Chew by mouth.   COMBIGAN 0.2-0.5 % ophthalmic solution Generic drug:  brimonidine-timolol Place 1 drop into both eyes every 12 (twelve) hours.   finasteride 5 MG tablet Commonly known as:  PROSCAR Take 5 mg by mouth daily.   glimepiride 4 MG tablet Commonly known as:  AMARYL Take 8 mg by mouth daily before breakfast. BRAND   metFORMIN 500 MG tablet Commonly known as:  GLUCOPHAGE Take by mouth 2 (two) times daily with a meal.   pantoprazole 40 MG tablet Commonly known as:  PROTONIX Take 1 tablet (40 mg total) by mouth daily.   PRESERVISION AREDS 2 PO Take 1 tablet by mouth 2 (two) times daily.   tamsulosin 0.4 MG Caps capsule Commonly known as:  FLOMAX Take 0.4 mg by mouth daily.   valsartan-hydrochlorothiazide 160-25 MG tablet Commonly known as:  DIOVAN-HCT Takes 1/2 tablet by mouth daily       Allergies:  Allergies  Allergen Reactions  . Sulfa Antibiotics     As infant  . Penicillins Rash    Family History: Family History  Problem Relation Age of Onset  . Cancer Sister        lung, nonsmoker  . Cancer Sister        lung, nonsmoker  .  Dementia Mother   . CAD Father        "heart problems"  . CAD Brother 3162       MI  . Stroke Neg Hx   . Prostate cancer Neg Hx   . Bladder Cancer Neg Hx   . Kidney cancer Neg Hx     Social History:  reports that he has quit smoking. His smoking use included Cigarettes and Cigars. He started smoking about 18 years ago. He has a 45.00 pack-year smoking history. He has never used smokeless tobacco. He reports that he drinks alcohol. He reports that he does not use drugs.  ROS: UROLOGY Frequent Urination?: Yes Hard to postpone urination?: Yes Burning/pain with urination?: No Get up at night to urinate?: Yes Leakage of urine?: Yes Urine stream starts and stops?: Yes Trouble starting stream?: No Do you have to strain to urinate?: No Blood in urine?: No Urinary tract infection?: No Sexually transmitted disease?: No Injury to kidneys or bladder?: No Painful intercourse?: No Weak stream?: Yes Erection problems?: Yes Penile pain?: No  Gastrointestinal Nausea?: No Vomiting?: No Indigestion/heartburn?: No Diarrhea?: No Constipation?: No  Constitutional Fever: No Night sweats?: No Weight loss?: No Fatigue?: Yes  Skin Skin rash/lesions?: No Itching?: No  Eyes Blurred vision?: Yes Double vision?: Yes  Ears/Nose/Throat Sore throat?: No Sinus problems?: No  Hematologic/Lymphatic Swollen glands?: No Easy bruising?: Yes  Cardiovascular Leg swelling?: Yes Chest pain?: No  Respiratory Cough?: No Shortness of breath?: No  Endocrine Excessive thirst?: No  Musculoskeletal Back pain?: No Joint pain?: Yes  Neurological Headaches?: No Dizziness?: No  Psychologic Depression?: No Anxiety?: No  Physical Exam: BP 122/72 (BP Location: Left Arm, Patient Position: Sitting, Cuff Size: Normal)   Pulse 70   Ht 5\' 9"  (1.753 m)   Wt 101.4 kg (223 lb 8 oz)   BMI 33.01 kg/m   Constitutional:  Alert and oriented, No acute distress. HEENT: [place] AT, moist mucus membranes.   Trachea midline, no masses. Cardiovascular: No clubbing, cyanosis, or edema. Respiratory: Normal respiratory effort, no increased work of breathing. GI: Abdomen is soft, nontender, nondistended, no abdominal masses GU: No CVA tenderness.  DRE - prostate 50 g, smooth, no hard area or nodule Skin: No rashes, bruises or suspicious lesions. Lymph: No cervical or inguinal adenopathy. Neurologic: Grossly intact, no focal deficits, moving all 4 extremities. Psychiatric: Normal mood and affect.  Laboratory Data: Lab Results  Component Value Date   WBC 6.4 04/23/2013   HGB 9.3 (L) 04/23/2013   HCT  27.5 (L) 04/23/2013   MCV 94 04/23/2013   PLT 164 04/23/2013    Lab Results  Component Value Date   CREATININE 0.68 04/23/2013    Lab Results  Component Value Date   PSA 3.14 02/28/2013   PSA 2.9 12/01/2011    No results found for: TESTOSTERONE  Lab Results  Component Value Date   HGBA1C 7.1 (H) 02/28/2013    Urinalysis    Component Value Date/Time   COLORURINE Yellow 04/22/2013 1032   APPEARANCEUR Clear 04/22/2013 1032   LABSPEC 1.020 04/22/2013 1032   PHURINE 5.0 04/22/2013 1032   GLUCOSEU Negative 04/22/2013 1032   HGBUR Negative 04/22/2013 1032   BILIRUBINUR Negative 04/22/2013 1032   KETONESUR Negative 04/22/2013 1032   PROTEINUR Negative 04/22/2013 1032   NITRITE Negative 04/22/2013 1032   LEUKOCYTESUR Negative 04/22/2013 1032    Pertinent Imaging: CT scan   Assessment & Plan:    1. Kidney stones - check KUB. Discussed nature r/b of continued surveillance, ESWL or URS. If two stones present he may need URS and we discussed staged procedure. He likely passed the stones given the pain and LUTS resolved and his UA cleared.   - Urinalysis, Complete  2. BPH with LUTS - I discussed with the patient and his wife the nature of prostate procedures. He'll continue medications.    3) PSA elevation - PSA would correct to about 6 and we discussed the nature of PSA  elevation and nature r/b of continued surveillance vs prostate bx. All questions answered. He'll continue surveillance.   No Follow-up on file.  Jerilee Field, MD  [facility] [address], [address] [place], [place] [address] [phone]

## 2016-11-01 NOTE — Op Note (Signed)
Date of procedure: 11/01/16  Preoperative diagnosis:  1. BPH with lower urinary tract symptoms 2. Left ureteral stone 2   Postoperative diagnosis:  1. Same as above   Procedure: 1. Holmium laser enucleation of the prostate with morcellation 2. Left ureteroscopy, laser lithotripsy 3. Left retrograde pyelogram 4. Left ureteral stent exchange  Surgeon: Vanna ScotlandAshley Dutchess Crosland, MD  Anesthesia: General  Complications: None  Intraoperative findings: Massive median lobe enucleated. Due to morcellator malfunction, the large median lobe was resected via TUR and all chips evacuated. Left ureteroscopy revealed 2 large 1 cm ureteral stones which were fragmented.  EBL: 100 cc  Specimens: Prostate, left ureteral stones 2  Drains: 22 French two-way Foley catheter with 30 cc balloon, 6 x 26 French double-J ureteral stent on left  Indication: Bradley [name] is a 81 y.o. patient with massive BPH and a large median lobe making ureteroscopy somewhat difficult. In addition he was symptomatic from this enlarged median lobe. As such, patient was counseled to undergo enucleation of his median lobe with left ureteroscopy.  After reviewing the management options for treatment, he elected to proceed with the above surgical procedure(s). We have discussed the potential benefits and risks of the procedure, side effects of the proposed treatment, the likelihood of the patient achieving the goals of the procedure, and any potential problems that might occur during the procedure or recuperation. Informed consent has been obtained.  Description of procedure:  The patient was taken to the operating room and general anesthesia was induced.  The patient was placed in the dorsal lithotomy position, prepped and draped in the usual sterile fashion, and preoperative antibiotics were administered. A preoperative time-out was performed.   A 26 French resectoscope was introduced into the bladder using a blunt angulated  obturator. Once in the bladder, the bladder was carefully inspected. The left ureteral stent was seen emanating from the left UO. Was massive enlargement of the median lobe with mass effect into the bladder. At this point time, a laser bridge and a 550  laser fiber was then brought in and using settings of 1.9 J and 40 Hz, 2 incisions were created on either side of the large median lobe from the bladder neck extending down meeting in the midline just proximal to the verumontanum. This incision was deepened down to the level of the capsule. The large median lobe was then enucleated from a caudal to cranial direction rolling the large lobe towards the bladder neck after separating it from the capsule. All totally, was able to cleave the large lobe from the bladder neck was free floating within the bladder. At this time, and attempted morcellation, however, unfortunately the on [facility] further device was malfunctioning. As such, attention was turned to ureteroscopy while the equipment malfunction was troubleshooted.  Attention was turned to the left ureteral orifice. The scope was exchanged for a 21 French cystoscope. The distal coil of the stent was grasped and brought to level of the urethral meatus. Then advanced a sensor wire beyond the level of stones up to level of the kidney. A dual lumen access sheath was used to introduce the Super Stiff wire. At some difficulty advancing this wire beyond the level of the stone and unfortunate, the wire unraveled. I was able to coil a second sensor wire just proximal to the stone. I then advanced a dual lumen 8 French ureteroscope to level of the stone. A small fragment of the unraveled wire was basketed tract using a 1.9 [place] to plus spinal basket.  In order to help facilitate breakage of this fairly large proximal ureteral stone and basketing, I ultimately was able to advance a wire under direct visualization beyond the stone. A Cook 12/14 ureteroscope was then advanced to  the level of the stone under fluoroscopic guidance. The inner cannula was removed. A 273  laser fiber was then brought in, using the settings of 0.8 J and 10 Hz, the stone was fragmented into tiny particles. A portion of these fragments effluxed into the renal pelvis. The scope was then advanced beyond this area where second stone was encountered within the renal pelvis, presumably previously within the proximal ureter and effluxed up to the kidney. The stone was fragmented to completion.  A retrograde pyelogram was then performed. This revealed mildly hydronephrotic kidney which created a roadmap to ensure that each never calyx had been directly visualized. There is a very small amount of perinephric extravasation appreciated.  The scope was then backed down the length of the ureter inspecting along the way. There is some mild edema in the proximal ureter where the stone had been previously fragmented as well as a superficial-appearing mucosal abrasion at this level but no obvious perforation. Contrast was escorted at this location and there was no ureteral extravasation appreciated. Finally, the scope was backed down the length of the ureter inspecting the remainder. There were no residual stone fragments appreciated. The scope was then removed. The safety wire was backloaded over a rigid cystoscope. A 6 x 26 French double-J ureteral stent was then advanced over the wire to level of the kidney. The wire was partially drawn and a full coil soap within the renal pelvis. The wire was then fully withdrawn and a full coil was noted within the bladder.  The resectoscope was then reintroduced into the bladder. There was very little bleeding. A few small clots were evacuated. The large median lobe was located within the bladder. Unfortunately, were not able to get the morcellator to function. As such, a large bipolar lead for the resectoscope was used to shave down the large median lobe within the bladder in a piece  wise fashion until it was able to be removed in entirety. This was somewhat painstaking. Care was taken to avoid any injury to the bladder. Once complete, the bladder was drained. All additional trips or evacuated. Small areas on the bladder neck were fulgurated using the cautery from the loop. The scope was then removed. A 22 French Foley catheter was then inserted using a catheter guide into the bladder. The balloon was filled with 30 cc of sterile water. 10 mg of IV Lasix was administered. The patient was then cleaned and dried, repositioned the supine position, reversed by anesthesia, taken to the PACU in stable condition.  Plan: Patient will return to the office tomorrow for a voiding trial/catheter removal. We will leave his stent in place for several weeks to allow for adequate drainage and passage of all additional stone fragment material.  [name], M.D.

## 2016-11-01 NOTE — Telephone Encounter (Signed)
-----   Message from Vanna ScotlandAshley Brandon, MD sent at 11/01/2016  3:07 PM EDT ----- Regarding: foley removal Please schedule this for a nurse visit tomorrow morning for a voiding trial/catheter removal.  He will then need a cystoscopy, stent removal with me in about 3 weeks, if he needs to, make at the week after I come back from vacation.  Vanna ScotlandAshley Brandon, MD

## 2016-11-01 NOTE — Anesthesia Preprocedure Evaluation (Signed)
Anesthesia Evaluation  Patient identified by MRN, date of birth, ID band Patient awake    Reviewed: Allergy & Precautions, NPO status , Patient's Chart, lab work & pertinent test results  History of Anesthesia Complications Negative for: history of anesthetic complications  Airway Mallampati: III       Dental  (+) Upper Dentures, Partial Lower   Pulmonary sleep apnea , former smoker,           Cardiovascular hypertension, Pt. on medications (-) Past MI and (-) CHF (-) dysrhythmias (-) Valvular Problems/Murmurs     Neuro/Psych neg Seizures    GI/Hepatic Neg liver ROS, PUD, GERD (pt denies)  ,  Endo/Other  diabetes, Type 2, Oral Hypoglycemic Agents  Renal/GU Renal disease (stones)     Musculoskeletal   Abdominal   Peds  Hematology   Anesthesia Other Findings   Reproductive/Obstetrics                             Anesthesia Physical Anesthesia Plan  ASA: III  Anesthesia Plan: General   Post-op Pain Management:    Induction: Intravenous  PONV Risk Score and Plan: 2 and Ondansetron and Dexamethasone  Airway Management Planned: Oral ETT  Additional Equipment:   Intra-op Plan:   Post-operative Plan:   Informed Consent: I have reviewed the patients History and Physical, chart, labs and discussed the procedure including the risks, benefits and alternatives for the proposed anesthesia with the patient or authorized representative who has indicated his/her understanding and acceptance.     Plan Discussed with:   Anesthesia Plan Comments:         Anesthesia Quick Evaluation

## 2016-11-02 ENCOUNTER — Encounter: Payer: Self-pay | Admitting: Urology

## 2016-11-02 ENCOUNTER — Ambulatory Visit: Payer: Medicare Other

## 2016-11-02 DIAGNOSIS — N400 Enlarged prostate without lower urinary tract symptoms: Secondary | ICD-10-CM

## 2016-11-02 NOTE — Anesthesia Postprocedure Evaluation (Signed)
Anesthesia Post Note  Patient: Bradley [name]  Procedure(s) Performed: Procedure(s) (LRB): HOLEP-LASER ENUCLEATION OF THE PROSTATE WITH MORCELLATION (N/A) URETEROSCOPY WITH HOLMIUM LASER LITHOTRIPSY (Left) CYSTOSCOPY WITH STENT REPLACEMENT (Left)  Patient location during evaluation: PACU Anesthesia Type: General Level of consciousness: awake and alert and oriented Pain management: pain level controlled Vital Signs Assessment: post-procedure vital signs reviewed and stable Respiratory status: spontaneous breathing Cardiovascular status: blood pressure returned to baseline Anesthetic complications: no     Last Vitals:  Vitals:   11/01/16 1606 11/01/16 1625  BP: (!) 150/70 127/73  Pulse: 84 65  Resp:    Temp:      Last Pain:  Vitals:   11/01/16 1625  TempSrc:   PainSc: 4                  Ahmaya Ostermiller

## 2016-11-02 NOTE — Progress Notes (Signed)
Catheter Removal  Patient is present today for a catheter removal.  30ml of water was drained from the balloon. A 20FR foley cath was removed from the bladder no complications were noted . Patient tolerated well.  Preformed by: Eligha BridegroomSarah Burnell Matlin, CMA

## 2016-11-03 LAB — SURGICAL PATHOLOGY

## 2016-11-10 LAB — STONE ANALYSIS
CA OXALATE, MONOHYDR.: 97 %
Ca phos cry stone ql IR: 3 %
Stone Weight KSTONE: 54.3 mg

## 2016-12-03 ENCOUNTER — Encounter: Payer: Self-pay | Admitting: Urology

## 2016-12-03 ENCOUNTER — Ambulatory Visit (INDEPENDENT_AMBULATORY_CARE_PROVIDER_SITE_OTHER): Payer: Medicare Other | Admitting: Urology

## 2016-12-03 VITALS — BP 112/72 | HR 70 | Ht 69.0 in | Wt 215.0 lb

## 2016-12-03 DIAGNOSIS — N401 Enlarged prostate with lower urinary tract symptoms: Secondary | ICD-10-CM

## 2016-12-03 DIAGNOSIS — N200 Calculus of kidney: Secondary | ICD-10-CM | POA: Diagnosis not present

## 2016-12-03 DIAGNOSIS — N138 Other obstructive and reflux uropathy: Secondary | ICD-10-CM

## 2016-12-03 LAB — MICROSCOPIC EXAMINATION

## 2016-12-03 LAB — URINALYSIS, COMPLETE
BILIRUBIN UA: NEGATIVE
GLUCOSE, UA: NEGATIVE
KETONES UA: NEGATIVE
NITRITE UA: NEGATIVE
SPEC GRAV UA: 1.025 (ref 1.005–1.030)
UUROB: 0.2 mg/dL (ref 0.2–1.0)
pH, UA: 5.5 (ref 5.0–7.5)

## 2016-12-03 MED ORDER — CIPROFLOXACIN HCL 500 MG PO TABS
500.0000 mg | ORAL_TABLET | Freq: Once | ORAL | Status: AC
  Administered 2016-12-03: 500 mg via ORAL

## 2016-12-03 MED ORDER — LIDOCAINE HCL 2 % EX GEL
1.0000 "application " | Freq: Once | CUTANEOUS | Status: AC
  Administered 2016-12-03: 1 via URETHRAL

## 2016-12-03 NOTE — Progress Notes (Signed)
   12/03/16  CC:  Chief Complaint  Patient presents with  . Cysto Stent Removal    HPI: 81 year old male with massive BPH and a very large median lobe and underwent staged left ureteroscopy as well as holmium enucleation of his median lobe. He returns today for cystoscopy, stent removal. He's been voiding without difficulty. He does have urgency and frequency as well as discomfort with voiding related to his stent. No fevers or chills.  Blood pressure 112/72, pulse 70, height 5\' 9"  (1.753 m), weight 215 lb (97.5 kg). NED. A&Ox3.   No respiratory distress   Abd soft, NT, ND Normal phallus with bilateral descended testicles  Cystoscopy/ Stent removal procedure  Patient identification was confirmed, informed consent was obtained, and patient was prepped using Betadine solution.  Lidocaine jelly was administered per urethral meatus.    Preoperative abx where received prior to procedure.    Procedure: - Flexible cystoscope introduced, without any difficulty.   - Thorough search of the bladder revealed:    normal urethral meatus  Stent seen emanating the left ureteral orifice, grasped with stent graspers, and removed in entirety.     Post-Procedure: - Patient tolerated the procedure well   Assessment/ Plan:  1. BPH with obstruction/lower urinary tract symptoms Status post holmium laser enucleation of the median lobe Recommend continuation of finasteride/Flomax for the time being, we'll reassess voiding symptoms at follow-up to discuss consideration of stopping these medications   2. Nephrolithiasis Status post left ureteroscopy for obstructing stone Stent removed today without difficulty Warning symptoms reviewed Follow-up in 4 weeks with renal ultrasound - Urinalysis, Complete - ciprofloxacin (CIPRO) tablet 500 mg; Take 1 tablet (500 mg total) by mouth once. - lidocaine (XYLOCAINE) 2 % jelly 1 application; Place 1 application into the urethra once. - US Renal;  Future  Vanna ScotlandAshley Joani Cosma, MD

## 2016-12-24 ENCOUNTER — Other Ambulatory Visit: Payer: Self-pay | Admitting: Neurology

## 2016-12-24 DIAGNOSIS — G252 Other specified forms of tremor: Secondary | ICD-10-CM

## 2016-12-24 DIAGNOSIS — R259 Unspecified abnormal involuntary movements: Principal | ICD-10-CM

## 2016-12-30 ENCOUNTER — Ambulatory Visit
Admission: RE | Admit: 2016-12-30 | Discharge: 2016-12-30 | Disposition: A | Discharge status: Home / Self Care | Payer: Medicare Other | Source: Ambulatory Visit | Attending: Neurology | Admitting: Neurology

## 2016-12-30 DIAGNOSIS — I6782 Cerebral ischemia: Secondary | ICD-10-CM | POA: Diagnosis not present

## 2016-12-30 DIAGNOSIS — R259 Unspecified abnormal involuntary movements: Secondary | ICD-10-CM | POA: Diagnosis present

## 2016-12-30 DIAGNOSIS — G252 Other specified forms of tremor: Secondary | ICD-10-CM

## 2017-01-04 ENCOUNTER — Ambulatory Visit
Admission: RE | Admit: 2017-01-04 | Discharge: 2017-01-04 | Disposition: A | Discharge status: Home / Self Care | Payer: Medicare Other | Source: Ambulatory Visit | Attending: Urology | Admitting: Urology

## 2017-01-04 DIAGNOSIS — N1330 Unspecified hydronephrosis: Secondary | ICD-10-CM | POA: Diagnosis not present

## 2017-01-04 DIAGNOSIS — N200 Calculus of kidney: Secondary | ICD-10-CM | POA: Diagnosis present

## 2017-01-12 ENCOUNTER — Encounter: Payer: Self-pay | Admitting: Urology

## 2017-01-12 ENCOUNTER — Ambulatory Visit (INDEPENDENT_AMBULATORY_CARE_PROVIDER_SITE_OTHER): Payer: Medicare Other | Admitting: Urology

## 2017-01-12 VITALS — BP 102/66 | HR 81 | Ht 69.0 in | Wt 213.0 lb

## 2017-01-12 DIAGNOSIS — N200 Calculus of kidney: Secondary | ICD-10-CM

## 2017-01-12 DIAGNOSIS — N1330 Unspecified hydronephrosis: Secondary | ICD-10-CM

## 2017-01-12 DIAGNOSIS — N400 Enlarged prostate without lower urinary tract symptoms: Secondary | ICD-10-CM | POA: Diagnosis not present

## 2017-01-12 LAB — BLADDER SCAN AMB NON-IMAGING

## 2017-01-12 NOTE — Progress Notes (Signed)
01/12/2017 9:30 AM   Bradley [name] July 27, 1935 [id number]  Referring provider: Kandyce Rud, MD [phone] [address] [facility] [place], [place] [address]  Chief Complaint  Patient presents with  . Nephrolithiasis    1 month w/PVR & RUS   . Benign Prostatic Hypertrophy    HPI: 81 year old male status post holmium laser enucleation of his median lobe, staged left ureteroscopy on 11/01/2016. Resection of his median lobe was deemed necessary in order to access his trigone. He did also have severe baseline urinary symptoms on maximal medical therapy including finasteride and Flomax.  Surgical pathology consistent with benign prostatic tissue with stromal hyperplasia, chronic impingement relation, benign urothelial mucosa. 47 g  enucleated.  Follow up RUS shows mild bilateral hydronephrosis, stable on right and improved on left.    PVR 0      IPSS    Row Name 01/12/17 1500         International Prostate Symptom Score   How often have you had the sensation of not emptying your bladder? Almost always     How often have you had to urinate less than every two hours? Not at All     How often have you found you stopped and started again several times when you urinated? Not at All     How often have you found it difficult to postpone urination? Not at All     How often have you had a weak urinary stream? Not at All     How often have you had to strain to start urination? Not at All     How many times did you typically get up at night to urinate? 1 Time     Total IPSS Score 6       Quality of Life due to urinary symptoms   If you were to spend the rest of your life with your urinary condition just the way it is now how would you feel about that? Pleased        Score:  1-7 Mild 8-19 Moderate 20-35 Severe   PMH: Past Medical History:  Diagnosis Date  . Anemia   . BPH (benign prostatic hypertrophy)   . Diabetes type 2, controlled  (HCC)   . GERD (gastroesophageal reflux disease)    RARE  . Glaucoma    [place]  . Hearing loss    bilateral hearing aids occasionally  . History of kidney stones   . HLD (hyperlipidemia)   . HTN (hypertension)   . Macular degeneration    Right ([place])  . Osteoarthritis    thumbs, hips, knees (s/p R TKR)  . PUD (peptic ulcer disease) 03/2013   2 antral gastric ulcers and 1 duodenal ulcer, NSAID related, admitted to [facility] with melena/anemia s/p EGD  . Sleep apnea    CPAP  . Tremor    intention    Surgical History: Past Surgical History:  Procedure Laterality Date  . BELPHAROPTOSIS REPAIR  2012  . carotid US  12/2011   no significant stenosis  . CATARACT EXTRACTION Bilateral 2011, 2012  . CHOLECYSTECTOMY  1997  . COLONOSCOPY  2009   per patient, told no longer needed  . CYSTOSCOPY W/ URETERAL STENT PLACEMENT Left 11/01/2016   Procedure: CYSTOSCOPY WITH STENT REPLACEMENT;  Surgeon: [name], MD;  Location: [facility];  Service: Urology;  Laterality: Left;  . CYSTOSCOPY WITH STENT PLACEMENT Left 10/26/2016   Procedure: CYSTOSCOPY WITH STENT PLACEMENT;  Surgeon: Apolinar Junes,  Morrie Sheldon, MD;  Location: [facility];  Service: Urology;  Laterality: Left;  . ESOPHAGOGASTRODUODENOSCOPY  04/2013   hospitalization @ [facility], gastritis, gastric ulcers and duodenal ulcer with clean base ([place])  . HOLEP-LASER ENUCLEATION OF THE PROSTATE WITH MORCELLATION N/A 11/01/2016   Procedure: HOLEP-LASER ENUCLEATION OF THE PROSTATE WITH MORCELLATION;  Surgeon: [name], MD;  Location: [facility];  Service: Urology;  Laterality: N/A;  . KIDNEY STONE SURGERY  83, 87, 90  . TOTAL KNEE ARTHROPLASTY Right 2006  . URETEROSCOPY Left 10/26/2016   Procedure: URETEROSCOPY;  Surgeon: [name], MD;  Location: [facility];  Service: Urology;  Laterality: Left;  . URETEROSCOPY WITH HOLMIUM LASER LITHOTRIPSY Left 11/01/2016   Procedure: URETEROSCOPY WITH HOLMIUM LASER LITHOTRIPSY;  Surgeon: [name], MD;   Location: [facility];  Service: Urology;  Laterality: Left;  . US ECHOCARDIOGRAPHY  12/2011   nl LV fxn, EF 60%, nl valves, dilated aortic root (35mm)    Home Medications:  Allergies as of 01/12/2017      Reactions   Sulfa Antibiotics    As infant   Penicillins Rash      Medication List       Accurate as of 01/12/17 11:59 PM. Always use your most recent med list.          acetaminophen 500 MG tablet Commonly known as:  TYLENOL Take 1,000 mg by mouth 2 (two) times daily.   atorvastatin 20 MG tablet Commonly known as:  LIPITOR Take 20 mg by mouth every evening.   carbidopa-levodopa 25-100 MG tablet Commonly known as:  SINEMET IR   COMBIGAN 0.2-0.5 % ophthalmic solution Generic drug:  brimonidine-timolol Place 1 drop into both eyes 2 (two) times daily.   finasteride 5 MG tablet Commonly known as:  PROSCAR Take 5 mg by mouth every morning.   glimepiride 4 MG tablet Commonly known as:  AMARYL Take 8 mg by mouth daily before breakfast.   HAIR/SKIN/NAILS PO Take 1 tablet by mouth 2 (two) times daily.   metFORMIN 500 MG 24 hr tablet Commonly known as:  GLUCOPHAGE-XR Take 500 mg by mouth 2 (two) times daily.   PRESERVISION AREDS 2 PO Take 1 tablet by mouth 2 (two) times daily.   valsartan-hydrochlorothiazide 160-25 MG tablet Commonly known as:  DIOVAN-HCT Take by mouth every morning. Takes 1/2 tablet by mouth daily   Vitamin D (Ergocalciferol) 50000 units Caps capsule Commonly known as:  DRISDOL Take by mouth.            Discharge Care Instructions        Start     Ordered   01/12/17 0000  BLADDER SCAN AMB NON-IMAGING     01/12/17 1449   01/12/17 0000  DG Abd 1 View    Question Answer Comment  Reason for Exam (SYMPTOM  OR DIAGNOSIS REQUIRED) kidney stones   Preferred imaging location? [facility]      01/12/17 1534      Allergies:  Allergies  Allergen Reactions  . Sulfa Antibiotics     As infant  . Penicillins Rash    Family  History: Family History  Problem Relation Age of Onset  . Cancer Sister        lung, nonsmoker  . Cancer Sister        lung, nonsmoker  . Dementia Mother   . CAD Father        "heart problems"  . CAD Brother 62       MI  . Stroke Neg  Hx   . Prostate cancer Neg Hx   . Bladder Cancer Neg Hx   . Kidney cancer Neg Hx     Social History:  reports that he quit smoking about 18 years ago. His smoking use included Cigarettes and Cigars. He started smoking about 18 years ago. He has a 45.00 pack-year smoking history. He has never used smokeless tobacco. He reports that he drinks alcohol. He reports that he does not use drugs.  ROS: UROLOGY Frequent Urination?: No Hard to postpone urination?: No Burning/pain with urination?: No Get up at night to urinate?: No Leakage of urine?: No Urine stream starts and stops?: No Trouble starting stream?: No Do you have to strain to urinate?: No Blood in urine?: No Urinary tract infection?: No Sexually transmitted disease?: No Injury to kidneys or bladder?: No Painful intercourse?: No Weak stream?: No Erection problems?: No Penile pain?: No  Gastrointestinal Nausea?: No Vomiting?: No Indigestion/heartburn?: No Diarrhea?: No Constipation?: No  Constitutional Fever: No Night sweats?: No Weight loss?: No Fatigue?: No  Skin Skin rash/lesions?: No Itching?: No  Eyes Blurred vision?: No Double vision?: No  Ears/Nose/Throat Sore throat?: No Sinus problems?: No  Hematologic/Lymphatic Swollen glands?: No Easy bruising?: Yes  Cardiovascular Leg swelling?: Yes Chest pain?: No  Respiratory Cough?: No Shortness of breath?: No  Endocrine Excessive thirst?: No  Musculoskeletal Back pain?: No Joint pain?: Yes  Neurological Headaches?: No Dizziness?: No  Psychologic Depression?: No Anxiety?: No  Physical Exam: BP 102/66   Pulse 81   Ht 5\' 9"  (1.753 m)   Wt 213 lb (96.6 kg)   BMI 31.45 kg/m   Constitutional:   Alert and oriented, No acute distress. HEENT: [place] AT, moist mucus membranes.  Trachea midline, no masses. Cardiovascular: No clubbing, cyanosis, or edema. Respiratory: Normal respiratory effort, no increased work of breathing. GI: Abdomen is soft, nontender, nondistended, no abdominal masses GU: No CVA tenderness.  Skin: No rashes, bruises or suspicious lesions. Neurologic: Grossly intact, no focal deficits, moving all 4 extremities. Psychiatric: Normal mood and affect.  Laboratory Data: Lab Results  Component Value Date   WBC 6.4 04/23/2013   HGB 9.3 (L) 04/23/2013   HCT 27.5 (L) 04/23/2013   MCV 94 04/23/2013   PLT 164 04/23/2013    Lab Results  Component Value Date   CREATININE 0.93 10/25/2016    Lab Results  Component Value Date   PSA 3.14 02/28/2013   PSA 2.9 12/01/2011     Lab Results  Component Value Date   HGBA1C 7.1 (H) 02/28/2013    Urinalysis N/a  Pertinent Imaging: Results for orders placed or performed in visit on 01/12/17  BLADDER SCAN AMB NON-IMAGING  Result Value Ref Range   Scan Result 0ml    CLINICAL DATA:  History of kidney stones; status post left ureteroscopy and lithotripsy in June of 2018  EXAM: RENAL / URINARY TRACT ULTRASOUND COMPLETE  COMPARISON:  KUB of Oct 19, 2016 and abdominal and pelvic CT scan dated September 09, 2016  FINDINGS: Right Kidney:  Length: 12.2 cm. Mild hydronephrosis persists on the right. The cortical echotexture remains lower than that of the liver.  Left Kidney:  Length: 11.5 cm. There is mild left-sided hydronephrosis which is less prominent than on the previous study. The renal cortical echotexture is similar to that on the right.  Bladder:  The partially distended urinary bladder is normal.  IMPRESSION: Mild bilateral hydronephrosis, stable on the right and improved on the left. I cannot exclude nonobstructing subcentimeter stones  in the midpole of the left kidney.   Electronically  Signed   By: David  [place] M.D.   On: 01/04/2017 13:57   Renal ultrasound was personally reviewed today. Based on comparison to previous CT scan on 09/10/2016, mild right sided "hydronephrosis" is likely parapelvic cyst as seen on CT scan. Dramatic improvement of left hydronephrosis, essentially mild fullness but without overt hydro.  Assessment & Plan:    1. Benign prostatic hyperplasia, unspecified whether lower urinary tract symptoms present Excellent emptying with significant improvement in urinary symptoms Advised to hold Flomax at this time. Continue finasteride given presence of residual coapting lateral lobes Reassess in 6 months - BLADDER SCAN AMB NON-IMAGING  2. Kidney stones Status post successful left ureteroscopy Stone prevention techniques reviewed, stress adequate fluid intake of primarily water KUB in 6 months - DG Abd 1 View; Future  3. Hydronephrosis, left As above, dramatic improvement since ureteroscopy No overt residual Hydro, only residual mild renal pelvic fullness which should resolve   Return in about 6 months (around 07/15/2017) for IPSS, PVR, KUB.  [name], MD  [facility] [address], [address] [place], [place] [address] [phone]

## 2017-04-26 ENCOUNTER — Ambulatory Visit: Payer: Medicare Other

## 2017-06-29 ENCOUNTER — Encounter: Payer: Self-pay | Admitting: Urology

## 2017-07-05 ENCOUNTER — Ambulatory Visit
Admission: RE | Admit: 2017-07-05 | Discharge: 2017-07-05 | Disposition: A | Discharge status: Home / Self Care | Payer: Medicare Other | Source: Ambulatory Visit | Attending: Urology | Admitting: Urology

## 2017-07-05 DIAGNOSIS — N200 Calculus of kidney: Secondary | ICD-10-CM

## 2017-07-06 ENCOUNTER — Ambulatory Visit (INDEPENDENT_AMBULATORY_CARE_PROVIDER_SITE_OTHER): Payer: Medicare Other | Admitting: Urology

## 2017-07-06 ENCOUNTER — Encounter: Payer: Self-pay | Admitting: Urology

## 2017-07-06 VITALS — BP 110/70 | HR 80 | Ht 68.0 in | Wt 216.0 lb

## 2017-07-06 DIAGNOSIS — N400 Enlarged prostate without lower urinary tract symptoms: Secondary | ICD-10-CM

## 2017-07-06 DIAGNOSIS — N138 Other obstructive and reflux uropathy: Secondary | ICD-10-CM | POA: Diagnosis not present

## 2017-07-06 DIAGNOSIS — N200 Calculus of kidney: Secondary | ICD-10-CM | POA: Diagnosis not present

## 2017-07-06 DIAGNOSIS — N401 Enlarged prostate with lower urinary tract symptoms: Secondary | ICD-10-CM | POA: Diagnosis not present

## 2017-07-06 LAB — BLADDER SCAN AMB NON-IMAGING

## 2017-07-06 NOTE — Progress Notes (Signed)
07/06/2017 7:33 PM   Bradley [name] 05/17/1936 [id number]  Referring provider: Kandyce Rud, MD [phone] [address] [facility] - [facility] Internal [facility] [place], [place] [address]  Chief Complaint  Patient presents with  . Benign Prostatic Hypertrophy    31month    HPI: 82 year old male with a history of nephrolithiasis/BPH who presents today for routine follow-up.   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 Resection of his median lobe was deemed necessary in order to access his trigone. He did also have severe baseline urinary symptoms on maximal medical therapy including finasteride and Flomax.  Today, he reports that he has been voiding well.  Initially, his stream was dramatically improved but started to wane a little bit over Christmas.  He then resumed his Flomax in addition to his finasteride and he is extremely satisfied with his voiding symptoms on these 2 medications.  IPSS as below.  PVR today minimal.  In terms of stones, he denies any further flank pain, gross hematuria, or any other symptoms related to stones.  He reports he struggles to drink sufficient water especially in the wintertime.  KUB today shows 1 cm stone in the left kidney, punctate stone right kidney.  Since his last visit, he is officially been diagnosed with Parkinson's disease.    IPSS    Row Name 07/06/17 1000         International Prostate Symptom Score   How often have you had the sensation of not emptying your bladder?  Not at All     How often have you had to urinate less than every two hours?  Less than half the time     How often have you found you stopped and started again several times when you urinated?  Not at All     How often have you found it difficult to postpone urination?  Not at All     How often have you had a weak urinary stream?  Less than 1 in 5 times     How often have you had to strain to  start urination?  Not at All     How many times did you typically get up at night to urinate?  1 Time     Total IPSS Score  4       Quality of Life due to urinary symptoms   If you were to spend the rest of your life with your urinary condition just the way it is now how would you feel about that?  Pleased        Score:  1-7 Mild 8-19 Moderate 20-35 Severe   PMH: Past Medical History:  Diagnosis Date  . Anemia   . BPH (benign prostatic hypertrophy)   . Diabetes type 2, controlled (HCC)   . GERD (gastroesophageal reflux disease)    RARE  . Glaucoma    Syndor  . Hearing loss    bilateral hearing aids occasionally  . History of kidney stones   . HLD (hyperlipidemia)   . HTN (hypertension)   . Macular degeneration    Right ([place])  . Osteoarthritis    thumbs, hips, knees (s/p R TKR)  . PUD (peptic ulcer disease) 03/2013   2 antral gastric ulcers and 1 duodenal ulcer, NSAID related, admitted to [facility] with melena/anemia s/p EGD  . Sleep apnea    CPAP  . Tremor    intention    Surgical History:  Past Surgical History:  Procedure Laterality Date  . BELPHAROPTOSIS REPAIR  2012  . carotid US  12/2011   no significant stenosis  . CATARACT EXTRACTION Bilateral 2011, 2012  . CHOLECYSTECTOMY  1997  . COLONOSCOPY  2009   per patient, told no longer needed  . CYSTOSCOPY W/ URETERAL STENT PLACEMENT Left 11/01/2016   Procedure: CYSTOSCOPY WITH STENT REPLACEMENT;  Surgeon: Vanna ScotlandBrandon, Jori Thrall, MD;  Location: [facility] ORS;  Service: Urology;  Laterality: Left;  . CYSTOSCOPY WITH STENT PLACEMENT Left 10/26/2016   Procedure: CYSTOSCOPY WITH STENT PLACEMENT;  Surgeon: Vanna ScotlandBrandon, Jud Fanguy, MD;  Location: [facility] ORS;  Service: Urology;  Laterality: Left;  . ESOPHAGOGASTRODUODENOSCOPY  04/2013   hospitalization @ [facility], gastritis, gastric ulcers and duodenal ulcer with clean base (Rein)  . HOLEP-LASER ENUCLEATION OF THE PROSTATE WITH MORCELLATION N/A 11/01/2016   Procedure: HOLEP-LASER ENUCLEATION OF  THE PROSTATE WITH MORCELLATION;  Surgeon: Vanna ScotlandBrandon, Khasir Woodrome, MD;  Location: [facility] ORS;  Service: Urology;  Laterality: N/A;  . KIDNEY STONE SURGERY  83, 87, 90  . TOTAL KNEE ARTHROPLASTY Right 2006  . URETEROSCOPY Left 10/26/2016   Procedure: URETEROSCOPY;  Surgeon: Vanna ScotlandBrandon, Kalise Fickett, MD;  Location: [facility] ORS;  Service: Urology;  Laterality: Left;  . URETEROSCOPY WITH HOLMIUM LASER LITHOTRIPSY Left 11/01/2016   Procedure: URETEROSCOPY WITH HOLMIUM LASER LITHOTRIPSY;  Surgeon: Vanna ScotlandBrandon, Keontre Defino, MD;  Location: [facility] ORS;  Service: Urology;  Laterality: Left;  . US ECHOCARDIOGRAPHY  12/2011   nl LV fxn, EF 60%, nl valves, dilated aortic root (40mm)    Home Medications:  Allergies as of 07/06/2017      Reactions   Sulfa Antibiotics    As infant   Penicillins Rash      Medication List        Accurate as of 07/06/17  7:33 PM. Always use your most recent med list.          acetaminophen 500 MG tablet Commonly known as:  TYLENOL Take 1,000 mg by mouth 2 (two) times daily.   atorvastatin 20 MG tablet Commonly known as:  LIPITOR Take 20 mg by mouth every evening.   carbidopa-levodopa 25-100 MG tablet Commonly known as:  SINEMET IR   COMBIGAN 0.2-0.5 % ophthalmic solution Generic drug:  brimonidine-timolol Place 1 drop into both eyes 2 (two) times daily.   finasteride 5 MG tablet Commonly known as:  PROSCAR Take 5 mg by mouth every morning.   glimepiride 4 MG tablet Commonly known as:  AMARYL Take 8 mg by mouth daily before breakfast.   HAIR/SKIN/NAILS PO Take 1 tablet by mouth 2 (two) times daily.   metFORMIN 500 MG 24 hr tablet Commonly known as:  GLUCOPHAGE-XR Take 500 mg by mouth 2 (two) times daily.   PRESERVISION AREDS 2 PO Take 1 tablet by mouth 2 (two) times daily.   tamsulosin 0.4 MG Caps capsule Commonly known as:  FLOMAX   valsartan-hydrochlorothiazide 160-25 MG tablet Commonly known as:  DIOVAN-HCT Take by mouth every morning. Takes 1/2 tablet by mouth daily         Allergies:  Allergies  Allergen Reactions  . Sulfa Antibiotics     As infant  . Penicillins Rash    Family History: Family History  Problem Relation Age of Onset  . Cancer Sister        lung, nonsmoker  . Cancer Sister        lung, nonsmoker  . Dementia Mother   . CAD Father        "heart problems"  .  CAD Brother 89       MI  . Stroke Neg Hx   . Prostate cancer Neg Hx   . Bladder Cancer Neg Hx   . Kidney cancer Neg Hx     Social History:  reports that he quit smoking about 18 years ago. His smoking use included cigarettes and cigars. He started smoking about 19 years ago. He has a 45.00 pack-year smoking history. he has never used smokeless tobacco. He reports that he drinks alcohol. He reports that he does not use drugs.  ROS: UROLOGY Frequent Urination?: No Hard to postpone urination?: No Burning/pain with urination?: No Get up at night to urinate?: Yes Leakage of urine?: No Urine stream starts and stops?: No Trouble starting stream?: No Do you have to strain to urinate?: No Blood in urine?: No Urinary tract infection?: No Sexually transmitted disease?: No Injury to kidneys or bladder?: No Painful intercourse?: No Weak stream?: No Erection problems?: No Penile pain?: No  Gastrointestinal Nausea?: No Vomiting?: No Indigestion/heartburn?: No Diarrhea?: No Constipation?: No  Constitutional Fever: No Night sweats?: No Weight loss?: No Fatigue?: Yes  Skin Skin rash/lesions?: No Itching?: No  Eyes Blurred vision?: No Double vision?: No  Ears/Nose/Throat Sore throat?: No Sinus problems?: No  Hematologic/Lymphatic Swollen glands?: No Easy bruising?: No  Cardiovascular Leg swelling?: Yes Chest pain?: No  Respiratory Cough?: No Shortness of breath?: No  Endocrine Excessive thirst?: No  Musculoskeletal Back pain?: No Joint pain?: No  Neurological Headaches?: No Dizziness?: No  Psychologic Depression?: No Anxiety?:  No  Physical Exam: BP 110/70   Pulse 80   Ht 5\' 8"  (1.727 m)   Wt 216 lb (98 kg)   BMI 32.84 kg/m   Constitutional:  Alert and oriented, No acute distress.  Accompanied by wife today. HEENT:  AT, moist mucus membranes.  Trachea midline, no masses. Cardiovascular: No clubbing, cyanosis, or edema. Respiratory: Normal respiratory effort, no increased work of breathing. Skin: No rashes, bruises or suspicious lesions. Neurologic: Grossly intact, no focal deficits, moving all 4 extremities. Psychiatric: Normal mood and affect.  Laboratory Data: Lab Results  Component Value Date   WBC 6.4 04/23/2013   HGB 9.3 (L) 04/23/2013   HCT 27.5 (L) 04/23/2013   MCV 94 04/23/2013   PLT 164 04/23/2013    Lab Results  Component Value Date   CREATININE 0.93 10/25/2016    Lab Results  Component Value Date   PSA1 1.4 10/19/2016    Lab Results  Component Value Date   HGBA1C 7.1 (H) 02/28/2013    Urinalysis N/a  Pertinent Imaging: Results for orders placed during the hospital encounter of 07/05/17  DG Abd 1 View   Narrative CLINICAL DATA:  Follow up left-sided kidney stones.  EXAM: ABDOMEN - 1 VIEW  COMPARISON:  10/19/2016.  CT 09/09/2016.  FINDINGS: Previously identified approximately 10 mm proximal left ureteral stone appears to be within the left kidney on today's exam. Previously identified distal left ureteral stone is not identified on today's exam. Stable tiny calcific density of the right kidney is again noted consistent with small right renal stone. Stable pelvic calcifications are again noted consistent phleboliths. Peripheral vascular calcification. No bowel distention. Stool noted throughout the colon. Lumbar spine scoliosis and degenerative change.  IMPRESSION: 1. Previously identified proximal left ureteral proximally 10 mm stones noted projected over the left kidney on today's exam. Previously identified left mid ureteral stone no longer identified.  2.   Stable tiny right renal sotne  Electronically Signed  ByMaisie Fus  Register   On: 07/05/2017 08:54    KUB personally reviewed  Results for orders placed or performed in visit on 07/06/17  BLADDER SCAN AMB NON-IMAGING  Result Value Ref Range   Scan Result 17ml      Assessment & Plan:    1. Benign prostatic hyperplasia with urinary obstruction Status post enucleation of median lobe, large volume resection Continues to do well on Flomax and finasteride, recurrent symptoms when weaned off Flomax thus prefers to stay on both - BLADDER SCAN AMB NON-IMAGING  2. Kidney stones New 1 cm left renal stone -based on review of imaging personally, I am suspicious this is not a true stone within the kidney Punctate right stone Currently asymptomatic Encourage hydration, will repeat KUB in 6 months to assess for progression   Return in about 6 months (around 01/03/2018) for KUB.  [name], MD  [facility] [address] [place], [place] [address] [phone]

## 2018-01-02 ENCOUNTER — Ambulatory Visit
Admission: RE | Admit: 2018-01-02 | Discharge: 2018-01-02 | Disposition: A | Discharge status: Home / Self Care | Payer: Medicare Other | Source: Ambulatory Visit | Attending: Urology | Admitting: Urology

## 2018-01-02 DIAGNOSIS — N200 Calculus of kidney: Secondary | ICD-10-CM | POA: Insufficient documentation

## 2018-01-02 DIAGNOSIS — K5900 Constipation, unspecified: Secondary | ICD-10-CM | POA: Insufficient documentation

## 2018-01-03 ENCOUNTER — Encounter: Payer: Self-pay | Admitting: Urology

## 2018-01-03 ENCOUNTER — Ambulatory Visit (INDEPENDENT_AMBULATORY_CARE_PROVIDER_SITE_OTHER): Payer: Medicare Other | Admitting: Urology

## 2018-01-03 VITALS — BP 114/74 | HR 79 | Ht 66.0 in | Wt 215.0 lb

## 2018-01-03 DIAGNOSIS — N200 Calculus of kidney: Secondary | ICD-10-CM | POA: Diagnosis not present

## 2018-01-03 DIAGNOSIS — N400 Enlarged prostate without lower urinary tract symptoms: Secondary | ICD-10-CM

## 2018-01-03 NOTE — Progress Notes (Signed)
01/03/2018 10:42 AM   Bradley [name] [birth year] [id number]  Referring provider: Kandyce RudBabaoff, Marcus, MD [phone] [address] [facility] [place] [facility] [place], [place] [id number]  Chief Complaint  Patient presents with  . Nephrolithiasis    6 month w/KUB    HPI: 82 year old male with a personal history of nephrolithiasis and BPH who returns today for six-month follow-up.  He was last seen in 06/2017.  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 Resection of his median lobe was deemed necessary in order to access his trigone.   Postoperatively, he was weaned off Flomax and finasteride but developed recurrent symptoms and resume these medications.  Today, he reports that he is voiding extremely well.  He has an excellent flow feels like he is emptying his bladder.  No urgency or frequency.  He gets up 0-1 times at night to void.  He is overall very pleased.  He does have a known nonobstructing 1 cm left stone.  KUB shows an unchanged stone burden today from 6 months ago.  He remains asymptomatic without flank pain or gross hematuria.  He has a personal history of an obstructing left ureteral calculi x 2 and underwent left ureteroscopy on 10/2016.  Normal Cr as of 11/2017  PMH: Past Medical History:  Diagnosis Date  . Anemia   . BPH (benign prostatic hypertrophy)   . Diabetes type 2, controlled (HCC)   . GERD (gastroesophageal reflux disease)    RARE  . Glaucoma    Syndor  . Hearing loss    bilateral hearing aids occasionally  . History of kidney stones   . HLD (hyperlipidemia)   . HTN (hypertension)   . Macular degeneration    Right ([place])  . Osteoarthritis    thumbs, hips, knees (s/p R TKR)  . PUD (peptic ulcer disease) 03/2013   2 antral gastric ulcers and 1 duodenal ulcer, NSAID related, [facility] with melena/anemia s/p EGD  . Sleep apnea    CPAP  . Tremor    intention     Surgical History: Past Surgical History:  Procedure Laterality Date  . BELPHAROPTOSIS REPAIR  2012  . carotid US  12/2011   no significant stenosis  . CATARACT EXTRACTION Bilateral 2011, 2012  . CHOLECYSTECTOMY  1997  . COLONOSCOPY  2009   per patient, told no longer needed  . CYSTOSCOPY W/ URETERAL STENT PLACEMENT Left 11/01/2016   Procedure: CYSTOSCOPY WITH STENT REPLACEMENT;  Surgeon: Vanna ScotlandBrandon, November Sypher, MD;  Location: [facility] ORS;  Service: Urology;  Laterality: Left;  . CYSTOSCOPY WITH STENT PLACEMENT Left 10/26/2016   Procedure: CYSTOSCOPY WITH STENT PLACEMENT;  Surgeon: Vanna ScotlandBrandon, Lee-Anne Flicker, MD;  Location: [facility] ORS;  Service: Urology;  Laterality: Left;  . ESOPHAGOGASTRODUODENOSCOPY  04/2013   hospitalization @ [facility], gastritis, gastric ulcers and duodenal ulcer with clean base (Rein)  . HOLEP-LASER ENUCLEATION OF THE PROSTATE WITH MORCELLATION N/A 11/01/2016   Procedure: HOLEP-LASER ENUCLEATION OF THE PROSTATE WITH MORCELLATION;  Surgeon: Vanna ScotlandBrandon, Taiesha Bovard, MD;  Location: [facility] ORS;  Service: Urology;  Laterality: N/A;  . KIDNEY STONE SURGERY  83, 87, 90  . TOTAL KNEE ARTHROPLASTY Right 2006  . URETEROSCOPY Left 10/26/2016   Procedure: URETEROSCOPY;  Surgeon: Vanna ScotlandBrandon, Sontee Desena, MD;  Location: [facility] ORS;  Service: Urology;  Laterality: Left;  . URETEROSCOPY WITH HOLMIUM LASER LITHOTRIPSY Left 11/01/2016   Procedure: URETEROSCOPY WITH HOLMIUM LASER LITHOTRIPSY;  Surgeon: Vanna ScotlandBrandon, Sharlize Hoar, MD;  Location: [facility] ORS;  Service:  Urology;  Laterality: Left;  . US ECHOCARDIOGRAPHY  12/2011   nl LV fxn, EF 60%, nl valves, dilated aortic root (40mm)    Home Medications:  Allergies as of 01/03/2018      Reactions   Sulfa Antibiotics    As infant   Penicillins Rash      Medication List        Accurate as of 01/03/18 10:42 AM. Always use your most recent med list.          acetaminophen 500 MG tablet Commonly known as:  TYLENOL Take 1,000 mg by mouth 2 (two) times daily.   atorvastatin 20 MG  tablet Commonly known as:  LIPITOR Take 20 mg by mouth every evening.   carbidopa-levodopa 25-100 MG tablet Commonly known as:  SINEMET IR   COMBIGAN 0.2-0.5 % ophthalmic solution Generic drug:  brimonidine-timolol Place 1 drop into both eyes 2 (two) times daily.   finasteride 5 MG tablet Commonly known as:  PROSCAR Take 5 mg by mouth every morning.   glimepiride 4 MG tablet Commonly known as:  AMARYL Take 8 mg by mouth daily before breakfast.   HAIR/SKIN/NAILS PO Take 1 tablet by mouth 2 (two) times daily.   metFORMIN 500 MG 24 hr tablet Commonly known as:  GLUCOPHAGE-XR Take 500 mg by mouth 2 (two) times daily.   PRESERVISION AREDS 2 PO Take 1 tablet by mouth 2 (two) times daily.   tamsulosin 0.4 MG Caps capsule Commonly known as:  FLOMAX   valsartan-hydrochlorothiazide 160-25 MG tablet Commonly known as:  DIOVAN-HCT Take by mouth every morning. Takes 1/2 tablet by mouth daily       Allergies:  Allergies  Allergen Reactions  . Sulfa Antibiotics     As infant  . Penicillins Rash    Family History: Family History  Problem Relation Age of Onset  . Cancer Sister        lung, nonsmoker  . Cancer Sister        lung, nonsmoker  . Dementia Mother   . CAD Father        "heart problems"  . CAD Brother 30       MI  . Stroke Neg Hx   . Prostate cancer Neg Hx   . Bladder Cancer Neg Hx   . Kidney cancer Neg Hx     Social History:  reports that he quit smoking about 19 years ago. His smoking use included cigarettes and cigars. He started smoking about 19 years ago. He has a 45.00 pack-year smoking history. He has never used smokeless tobacco. He reports that he drinks alcohol. He reports that he does not use drugs.  ROS: UROLOGY Frequent Urination?: No Hard to postpone urination?: No Burning/pain with urination?: No Get up at night to urinate?: No Leakage of urine?: No Urine stream starts and stops?: No Trouble starting stream?: No Do you have to strain  to urinate?: No Blood in urine?: No Urinary tract infection?: No Sexually transmitted disease?: No Injury to kidneys or bladder?: No Painful intercourse?: No Weak stream?: No Erection problems?: No Penile pain?: No  Gastrointestinal Nausea?: No Vomiting?: No Indigestion/heartburn?: No Diarrhea?: No Constipation?: No  Constitutional Fever: No Night sweats?: No Weight loss?: No Fatigue?: Yes  Skin Skin rash/lesions?: No Itching?: No  Eyes Blurred vision?: No Double vision?: No  Ears/Nose/Throat Sore throat?: No Sinus problems?: No  Hematologic/Lymphatic Swollen glands?: No Easy bruising?: No  Cardiovascular Leg swelling?: Yes Chest pain?: No  Respiratory Cough?: No Shortness  of breath?: No  Endocrine Excessive thirst?: No  Musculoskeletal Back pain?: No Joint pain?: No  Neurological Headaches?: No Dizziness?: No  Psychologic Depression?: No Anxiety?: No  Physical Exam: BP 114/74   Pulse 79   Ht 5\' 6"  (1.676 m)   Wt 215 lb (97.5 kg)   BMI 34.70 kg/m   Constitutional:  Alert and oriented, No acute distress.  Accompanied by wife today. HEENT: [place] AT, moist mucus membranes.  Trachea midline, no masses. Cardiovascular: No clubbing, cyanosis.  Mild bilateral lower extremity edema. Respiratory: Normal respiratory effort, no increased work of breathing. GI: Abdomen is soft, nontender, nondistended, no abdominal masses GU: No CVA tenderness Skin: No rashes, bruises or suspicious lesions. Neurologic: Grossly intact, no focal deficits, moving all 4 extremities. Psychiatric: Normal mood and affect.  Laboratory Data: Lab Results  Component Value Date   WBC 6.4 04/23/2013   HGB 9.3 (L) 04/23/2013   HCT 27.5 (L) 04/23/2013   MCV 94 04/23/2013   PLT 164 04/23/2013    Lab Results  Component Value Date   CREATININE 0.93 10/25/2016    Lab Results  Component Value Date   PSA 3.14 02/28/2013   PSA 2.9 12/01/2011     Urinalysis N/a  Pertinent Imaging: Results for orders placed during the hospital encounter of 01/02/18  DG Abd 1 View   Narrative CLINICAL DATA:  History of kidney stones.  EXAM: ABDOMEN - 1 VIEW  COMPARISON:  July 05, 2017  FINDINGS: The bowel gas pattern is normal. 1 cm stone is identified in the left kidney unchanged. The previously noted right kidney stone is not as well seen. Patient status post prior cholecystectomy. Moderate bowel content is identified throughout colon. Degenerative joint changes of the spine are noted.  IMPRESSION: 1 cm stone is identified in the left kidney unchanged. Previously noted small right kidney stone is not as well seen on today's exam.  No bowel obstruction.  Constipation.   Electronically Signed   By: Sherian ReinWei-Chen  Lin M.D.   On: 01/02/2018 08:15    KUB reviewed personally today.  This is unchanged from KUB 6 months ago.   Assessment & Plan:    1. Benign prostatic hyperplasia without lower urinary tract symptoms Symptoms well controlled Personally desires to continue Flomax and finasteride as he believes these help with his symptoms Continue these medications Recheck in 1 year  2. Kidney stones 1 cm left intrarenal stone, and possible punctate right stone but not seen today This KUB today remains stable from KUB 6 years ago He continues to be a symptom medic He would prefer continued observation which is reasonable given his age and comorbidities and lack of symptoms Continue to encourage hydration and stone diet Plan for KUB 1 year or sooner as needed    Return in about 1 year (around 01/04/2019) for KUB.  Vanna ScotlandAshley Shayde Gervacio, MD  [facility] [address], [address] [place], [place] [address] [phone]

## 2018-09-19 ENCOUNTER — Encounter: Payer: Self-pay | Admitting: Physical Therapy

## 2018-09-26 ENCOUNTER — Ambulatory Visit: Payer: PRIVATE HEALTH INSURANCE | Admitting: Speech Pathology

## 2018-09-26 ENCOUNTER — Ambulatory Visit: Payer: PRIVATE HEALTH INSURANCE | Admitting: Physical Therapy

## 2018-10-02 ENCOUNTER — Ambulatory Visit: Payer: PRIVATE HEALTH INSURANCE | Admitting: Physical Therapy

## 2018-10-02 ENCOUNTER — Encounter: Payer: PRIVATE HEALTH INSURANCE | Admitting: Speech Pathology

## 2018-10-03 ENCOUNTER — Encounter: Payer: PRIVATE HEALTH INSURANCE | Admitting: Speech Pathology

## 2018-10-03 ENCOUNTER — Ambulatory Visit: Payer: PRIVATE HEALTH INSURANCE | Admitting: Physical Therapy

## 2018-10-04 ENCOUNTER — Encounter: Payer: PRIVATE HEALTH INSURANCE | Admitting: Speech Pathology

## 2018-10-04 ENCOUNTER — Ambulatory Visit: Payer: PRIVATE HEALTH INSURANCE | Admitting: Physical Therapy

## 2018-10-05 ENCOUNTER — Encounter: Payer: PRIVATE HEALTH INSURANCE | Admitting: Speech Pathology

## 2018-10-05 ENCOUNTER — Ambulatory Visit: Payer: PRIVATE HEALTH INSURANCE | Admitting: Physical Therapy

## 2018-10-09 ENCOUNTER — Ambulatory Visit: Payer: PRIVATE HEALTH INSURANCE | Admitting: Physical Therapy

## 2018-10-09 ENCOUNTER — Encounter: Payer: PRIVATE HEALTH INSURANCE | Admitting: Speech Pathology

## 2018-10-10 ENCOUNTER — Encounter: Payer: PRIVATE HEALTH INSURANCE | Admitting: Speech Pathology

## 2018-10-11 ENCOUNTER — Encounter: Payer: PRIVATE HEALTH INSURANCE | Admitting: Speech Pathology

## 2018-10-11 ENCOUNTER — Ambulatory Visit: Payer: PRIVATE HEALTH INSURANCE | Admitting: Physical Therapy

## 2018-10-12 ENCOUNTER — Ambulatory Visit: Payer: PRIVATE HEALTH INSURANCE | Admitting: Physical Therapy

## 2018-10-17 ENCOUNTER — Encounter: Payer: PRIVATE HEALTH INSURANCE | Admitting: Speech Pathology

## 2018-10-17 ENCOUNTER — Ambulatory Visit: Payer: PRIVATE HEALTH INSURANCE | Admitting: Physical Therapy

## 2018-10-18 ENCOUNTER — Ambulatory Visit: Payer: PRIVATE HEALTH INSURANCE | Admitting: Physical Therapy

## 2018-10-18 ENCOUNTER — Encounter: Payer: PRIVATE HEALTH INSURANCE | Admitting: Speech Pathology

## 2018-10-19 ENCOUNTER — Encounter: Payer: PRIVATE HEALTH INSURANCE | Admitting: Speech Pathology

## 2018-10-19 ENCOUNTER — Ambulatory Visit: Payer: PRIVATE HEALTH INSURANCE | Admitting: Physical Therapy

## 2018-10-23 ENCOUNTER — Ambulatory Visit: Payer: PRIVATE HEALTH INSURANCE | Admitting: Physical Therapy

## 2018-10-23 ENCOUNTER — Encounter: Payer: PRIVATE HEALTH INSURANCE | Admitting: Speech Pathology

## 2018-10-24 ENCOUNTER — Ambulatory Visit: Payer: PRIVATE HEALTH INSURANCE | Admitting: Physical Therapy

## 2018-10-24 ENCOUNTER — Encounter: Payer: PRIVATE HEALTH INSURANCE | Admitting: Speech Pathology

## 2018-10-25 ENCOUNTER — Ambulatory Visit: Payer: PRIVATE HEALTH INSURANCE | Admitting: Physical Therapy

## 2018-10-25 ENCOUNTER — Encounter: Payer: PRIVATE HEALTH INSURANCE | Admitting: Speech Pathology

## 2018-10-26 ENCOUNTER — Ambulatory Visit: Payer: PRIVATE HEALTH INSURANCE | Admitting: Physical Therapy

## 2018-10-26 ENCOUNTER — Encounter: Payer: PRIVATE HEALTH INSURANCE | Admitting: Speech Pathology

## 2018-10-30 ENCOUNTER — Encounter: Payer: PRIVATE HEALTH INSURANCE | Admitting: Speech Pathology

## 2018-10-30 ENCOUNTER — Ambulatory Visit: Payer: PRIVATE HEALTH INSURANCE | Admitting: Physical Therapy

## 2018-10-31 ENCOUNTER — Ambulatory Visit: Payer: PRIVATE HEALTH INSURANCE | Admitting: Physical Therapy

## 2018-12-27 IMAGING — CR DG ABDOMEN 1V
1 series · 3 of 3 positions shown · non-contrast
Comparison: CT 09/09/2016.

CLINICAL DATA: Left-sided kidney stones.

EXAM:
ABDOMEN - 1 VIEW

[Series 1: dg abd 1 view · 0.14mm/px · 3 of 3 slices shown]
[im 1/3]
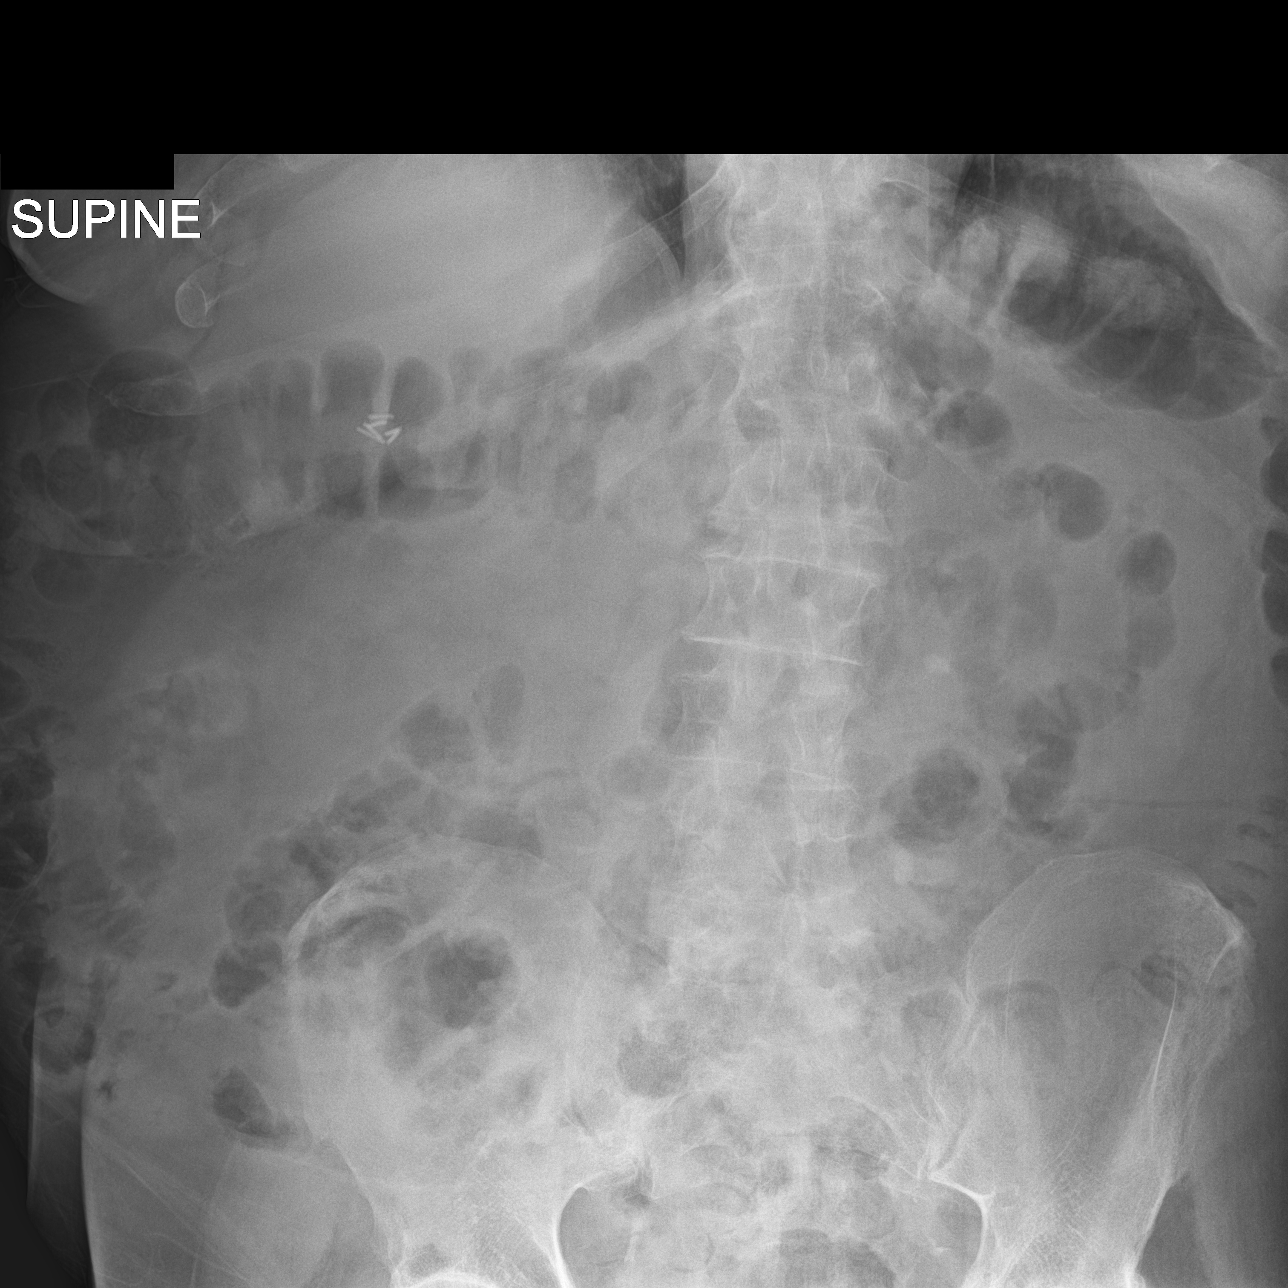
[im 2/3]
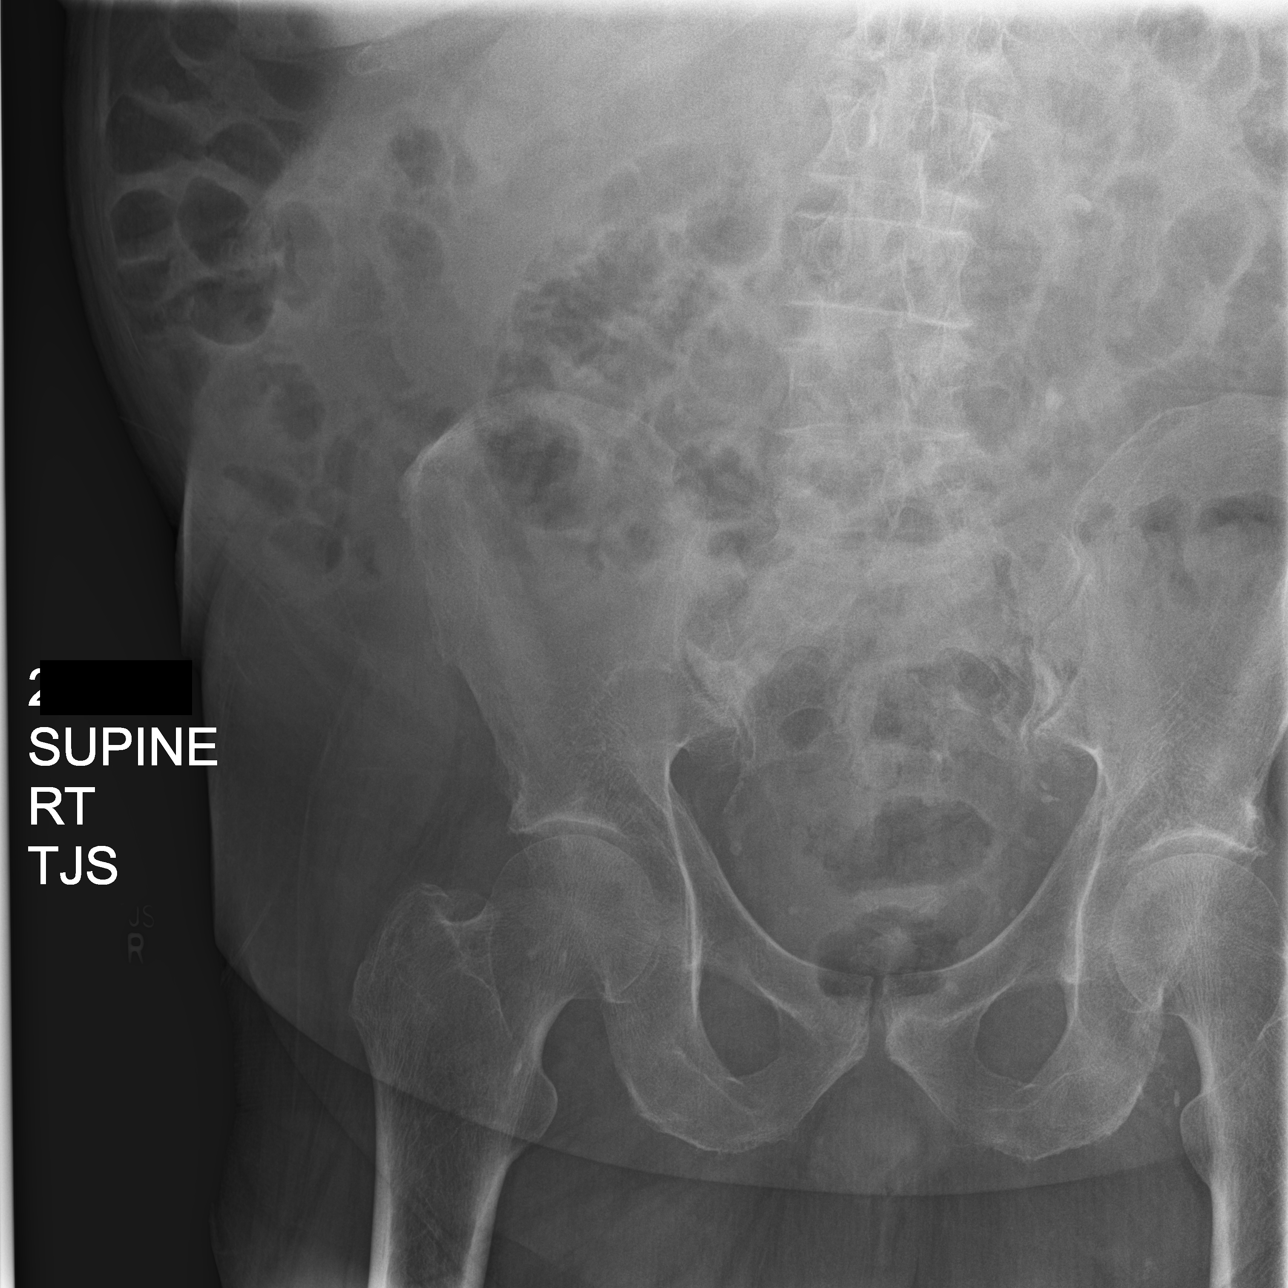
[im 3/3]
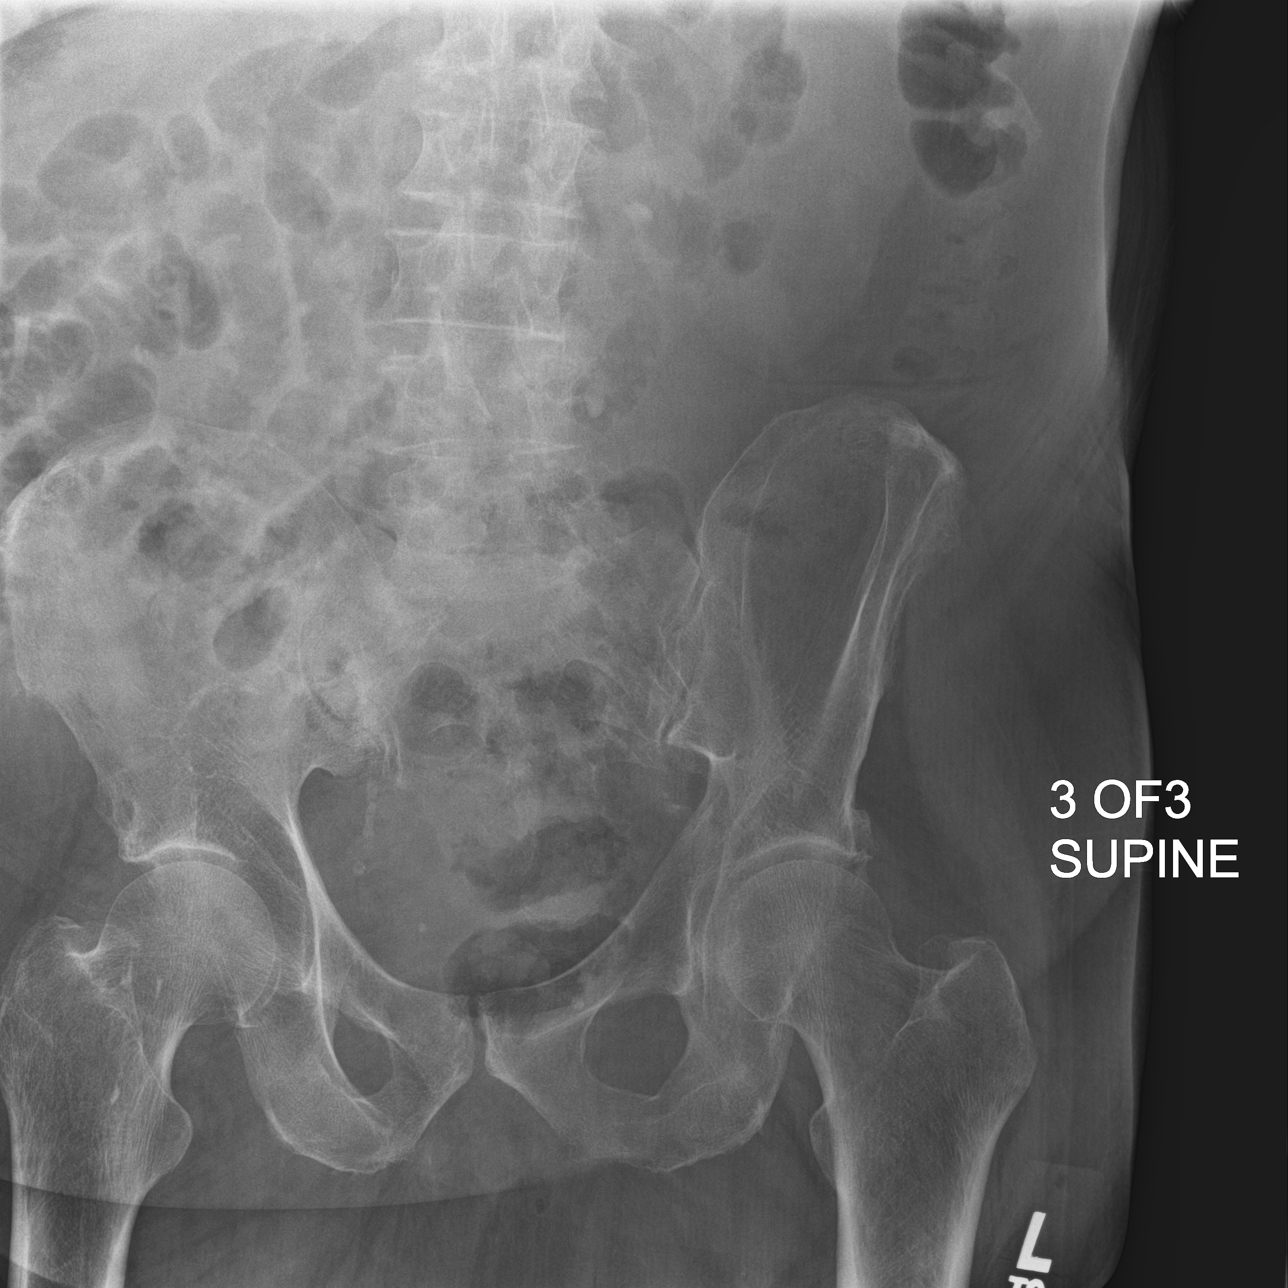

[3 of 3 positions shown; findings below may reference images not displayed]

FINDINGS: Surgical clips right upper quadrant. Soft tissue structures are
unremarkable. Two approximately 10 mm densities again noted over the
left upper with the uppermost calcific density over the upper ureter
and the floor calcific density noted over the left mid ureter at the
level of L4. Similar findings noted on prior exam. Soft tissue
structures of the pelvis unremarkable. Pelvic calcifications
consistent phleboliths. Aortoiliac atherosclerotic vascular
calcification. Degenerative changes lumbar spine and both hips.
IMPRESSION: Two approximately 10 mm stones again noted over the left ureter. One
stones appears to be present in the upper left ureter, the other in
the left mid ureter . Similar findings noted on prior CT.

## 2019-01-04 ENCOUNTER — Ambulatory Visit
Admission: RE | Admit: 2019-01-04 | Discharge: 2019-01-04 | Disposition: A | Discharge status: Home / Self Care | Payer: Medicare Other | Source: Ambulatory Visit | Attending: Urology | Admitting: Urology

## 2019-01-04 ENCOUNTER — Encounter: Payer: Self-pay | Admitting: Urology

## 2019-01-04 ENCOUNTER — Other Ambulatory Visit: Payer: Self-pay

## 2019-01-04 DIAGNOSIS — N200 Calculus of kidney: Secondary | ICD-10-CM

## 2019-01-05 ENCOUNTER — Ambulatory Visit: Payer: Medicare Other | Admitting: Urology

## 2019-01-08 ENCOUNTER — Ambulatory Visit: Payer: Medicare Other | Admitting: Physical Therapy

## 2019-01-08 ENCOUNTER — Encounter: Payer: Self-pay | Admitting: Speech Pathology

## 2019-01-08 ENCOUNTER — Ambulatory Visit: Payer: Medicare Other | Attending: Neurology | Admitting: Speech Pathology

## 2019-01-08 ENCOUNTER — Other Ambulatory Visit: Payer: Self-pay

## 2019-01-08 ENCOUNTER — Ambulatory Visit: Payer: PRIVATE HEALTH INSURANCE | Admitting: Occupational Therapy

## 2019-01-08 ENCOUNTER — Encounter: Payer: Self-pay | Admitting: Physical Therapy

## 2019-01-08 DIAGNOSIS — R262 Difficulty in walking, not elsewhere classified: Secondary | ICD-10-CM

## 2019-01-08 DIAGNOSIS — R490 Dysphonia: Secondary | ICD-10-CM | POA: Insufficient documentation

## 2019-01-08 DIAGNOSIS — M6281 Muscle weakness (generalized): Secondary | ICD-10-CM | POA: Insufficient documentation

## 2019-01-08 NOTE — Therapy (Signed)
[facility] [address] [place], [place], [address] Phone: [phone]   Fax:  [phone]  Physical Therapy Evaluation  Patient Details  Name: Bradley [name] MRN: [id number] Date of Birth: 19-Aug-1935 No data recorded  Encounter Date: 01/08/2019    PT End of Session - 01/10/19 1356    Visit Number  1    Number of Visits  17    Date for PT Re-Evaluation  02/21/19    PT Start Time  0955    PT Stop Time  1055    PT Time Calculation (min)  60 min    Equipment Utilized During Treatment  Gait belt    Activity Tolerance  Patient tolerated treatment well    Behavior During Therapy  WFL for tasks assessed/performed        Past Medical History:  Diagnosis Date   Anemia    BPH (benign prostatic hypertrophy)    Diabetes type 2, controlled (HCC)    GERD (gastroesophageal reflux disease)    RARE   Glaucoma    [place]   Hearing loss    bilateral hearing aids occasionally   History of kidney stones    HLD (hyperlipidemia)    HTN (hypertension)    Macular degeneration    Right ([place])   Osteoarthritis    thumbs, hips, knees (s/p R TKR)   PUD (peptic ulcer disease) 03/2013   2 antral gastric ulcers and 1 duodenal ulcer, NSAID related, [facility] with melena/anemia s/p EGD   Sleep apnea    CPAP   Tremor    intention    Past Surgical History:  Procedure Laterality Date   BELPHAROPTOSIS REPAIR  2012   carotid US  12/2011   no significant stenosis   CATARACT EXTRACTION Bilateral 2011, 2012   CHOLECYSTECTOMY  1997   COLONOSCOPY  2009   per patient, told no longer needed   CYSTOSCOPY W/ URETERAL STENT PLACEMENT Left 11/01/2016   Procedure: CYSTOSCOPY WITH STENT REPLACEMENT;  Surgeon: Bradley [name];  Location: [facility] ORS;  Service: Urology;  Laterality: Left;   CYSTOSCOPY WITH STENT PLACEMENT Left 10/26/2016   Procedure: CYSTOSCOPY WITH STENT PLACEMENT;  Surgeon: Bradley [name];   Location: [facility] ORS;  Service: Urology;  Laterality: Left;   ESOPHAGOGASTRODUODENOSCOPY  04/2013   hospitalization @ [facility], gastritis, gastric ulcers and duodenal ulcer with clean base ([place])   HOLEP-LASER ENUCLEATION OF THE PROSTATE WITH MORCELLATION N/A 11/01/2016   Procedure: HOLEP-LASER ENUCLEATION OF THE PROSTATE WITH MORCELLATION;  Surgeon: Bradley [name];  Location: [facility] ORS;  Service: Urology;  Laterality: N/A;   KIDNEY STONE SURGERY  83, 87, 90   TOTAL KNEE ARTHROPLASTY Right 2006   URETEROSCOPY Left 10/26/2016   Procedure: URETEROSCOPY;  Surgeon: Bradley [name];  Location: [facility] ORS;  Service: Urology;  Laterality: Left;   URETEROSCOPY WITH HOLMIUM LASER LITHOTRIPSY Left 11/01/2016   Procedure: URETEROSCOPY WITH HOLMIUM LASER LITHOTRIPSY;  Surgeon: Bradley [name];  Location: [facility] ORS;  Service: Urology;  Laterality: Left;   US ECHOCARDIOGRAPHY  12/2011   nl LV fxn, EF 60%, nl valves, dilated aortic root (40mm)    There were no vitals filed for this visit.     Subjective Assessment - 01/10/19 1354    Subjective  Patient states he feels better since he has increased his activity level but that he still gets fatigue and has decreased stamina and balance.    Pertinent History  Patient reports he was diagnosed with Parkinson's  Disease December 23, 2016. Patient reports he has not noticed any weakness on one side of his body compared to the other. Since the COVID epidemic, patient reports that he has become more physically active and has lost weight. Patient is gradually built up to now being able to walk 1 mile a day. Patient reports he was walking with a cane prior to COVID and now is ambulating without assistive device. Patient states he feels good. Patient states he lives in a retirement community, [facility], and was using the gym at first until COVID. Then, he started walking 3-4 days a week in April and towards the end of the month now he walks daily. Patient is on weight  watchers program and has lost about 20 pounds. Patient reports that before he lost weight and got in better shape his Parkinson's medication was not working as well and he was told that they might have to increase his dosage, but patient states he saw his neurologist recently and they did not need to adjust his medications. Patient usually uses [facility] for community mobility at times. Patient reports he has the most difficulty with dexterity in his fingertips and with stamina/endurance. 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 Patient wears glasses. Patient reports he only has 50-60% vision due to macular degeneration. 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 Patient gets tremors in the evening and when fatigued left hand tremors more than right.  Patient reports his hand writing has declined since his diagnosis. Patient reports that the medication helps his symptoms and that fatigue worsens his symptoms of Parkinson's. Patient reports that he has difficult with bending and walking. Patient would like to work on improving his balance, increase his walking endurance, be better able to work on model trains and with his speech. Patient is also enrolled in the [employer] [facility] program.    Currently in Pain?  No/denies         [facility] PT Assessment - 01/10/19 1404      Assessment   Medical Diagnosis  Parkinson's Disease; LSVT BIG and LOUD    Referring Provider (PT)  Dr. Sherryll Burger      Precautions   Precautions  Fall      Restrictions   Weight Bearing Restrictions  No      Balance Screen   Has the patient fallen in the past 6 months  No    Has the patient had a decrease in activity level because of a fear of falling?   No    Is the patient reluctant to leave their home because of a fear of falling?   No      Home Environment   Living Environment  Private residence    Living Arrangements  Spouse/significant  other    Available Help at Discharge  Friend(s);Family    Type of Home  House    Home Access  Level entry    Home Layout  One level    Home Equipment  [place] - single point;Toilet riser   shower grab bars     Prior Function   Level of Independence  Independent with community mobility without device    Vocation  Retired    Leisure  [employer] trains      Cognition   Overall Cognitive Status  Within Functional Limits for tasks assessed  Standardized Balance Assessment   Standardized Balance Assessment  Berg Balance Test;Timed Up and Go Test;10 meter walk test;Five Times Sit to Stand    Five times sit to stand comments   14 seconds using hands and 16 seconds not using hands      Berg Balance Test   Sit to Stand  Able to stand without using hands and stabilize independently    Standing Unsupported  Able to stand safely 2 minutes    Sitting with Back Unsupported but Feet Supported on Floor or Stool  Able to sit safely and securely 2 minutes    Stand to Sit  Sits safely with minimal use of hands    Transfers  Able to transfer safely, minor use of hands    Standing Unsupported with Eyes Closed  Able to stand 10 seconds with supervision    Standing Unsupported with Feet Together  Able to place feet together independently and stand 1 minute safely    From Standing, Reach Forward with Outstretched Arm  Can reach confidently >25 cm (10")    From Standing Position, Pick up Object from Floor  Able to pick up shoe, needs supervision    From Standing Position, Turn to Look Behind Over each Shoulder  Looks behind from both sides and weight shifts well    Turn 360 Degrees  Able to turn 360 degrees safely but slowly   5 seconds   Standing Unsupported, Alternately Place Feet on Step/Stool  Able to stand independently and safely and complete 8 steps in 20 seconds    Standing Unsupported, One Foot in Front  Able to place foot ahead of the other independently and hold 30 seconds    Standing on One Leg   Tries to lift leg/unable to hold 3 seconds but remains standing independently    Total Score  48      Timed Up and Go Test   Normal TUG (seconds)  11    Manual TUG (seconds)  13    Cognitive TUG (seconds)  13       EVALUATION   HISTORY:  Subjective history of current problem: Patient reports he was diagnosed with Parkinson's Disease December 23, 2016. Patient reports he has not noticed any weakness on one side of his body compared to the other. Since the COVID epidemic, patient reports that he has become more physically active and has lost weight. Patient is gradually built up to now being able to walk 1 mile a day. Patient reports he was walking with a cane prior to COVID and now is ambulating without assistive device. Patient states he feels good. Patient states he lives in a [facility], [facility], and was using the gym at first until COVID. Then, he started walking 3-4 days a week in April and towards the end of the month now he walks daily. Patient is on weight watchers program and has lost about 20 pounds. Patient reports that before he lost weight and got in better shape his Parkinson's medication was not working as well and he was told that they might have to increase his dosage, but patient states he saw his neurologist recently and they did not need to adjust his medications. Patient usually uses [facility] for community mobility at times. Patient reports he has the most difficulty with dexterity in his fingertips and with stamina/endurance. 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 Patient wears glasses. Patient reports he only has 50-60% vision due to macular degeneration. 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 Patient gets tremors in the evening and when fatigued left hand tremors more than right.  Patient reports his hand writing has declined since his diagnosis. Patient reports  that the medication helps his symptoms and that fatigue worsens his symptoms of Parkinson's. Patient reports that he has difficult with bending and walking. Patient would like to work on improving his balance, increase his walking endurance, be better able to work on model trains and with his speech. Patient is also enrolled in the [place] (yes/no): no Number of falls in past 6 months: 0  Current Symptoms: (dysarthria, dysphagia, drop attacks, bowel and bladder changes, recent weight loss/gain)    Review of systems negative for red flags.     EXAMINATION  POSTURE:  Rounded shoulders; left shoulder lower than right in standing  NEUROLOGICAL SCREEN: (2+ unless otherwise noted.) N=normal  Ab=abnormal  Level Dermatome R L Myotome R L  C5 Lateral Upper Arm N N Shoulder ABD C4-5    C6 Lateral Arm/ Thumb   Arm Flex/ Wrist Ext C5-6 N N  C7 Middle Finger   Arm Ext//Wrist Flex C6-7 N N  C8 4th & 5th Finger   Flex/ Ext Carpi Ulnaris C8 N N  T1 Medial Arm N N Interossei T1 N N  L2 Medial thigh/groin N N Illiopsoas (L2-3) N N  L3 Lower thigh/med.knee N N Quadriceps (L3-4) N N  L4 Medial leg/lat thigh N N Tibialis Ant (L4-5) N N  L5 Lat. leg & dorsal foot N N EHL (L5)    S1 post/lat foot/thigh/leg N N Gastrocnemius (S1-2)    S2 Post./med. thigh & leg N N Hamstrings (L4-S3) N N    SOMATOSENSORY:  Any N & T in extremities or weakness: denies      COORDINATION: Finger to Nose: Normal Past Pointing:    Normal   MUSCULOSKELETAL SCREEN:  ROM:  B UEs and LEs WFL  MMT:  L UE: +4/5 shoulder flexors, 5/5 biceps and -5/5 triceps R UE: +4/5 shoulder flexors, 5/5 biceps and -5/5 triceps  L LE: -4/5 hip flexors, 4/5 hip ABD and ADD, 4/5 hams R LE: -4/5 hip flexors, 4/5 hip AND and ADD, 4/5 hams  Functional Mobility: Patient independent with transfers.  Gait: Patient arrives ambulating without AD. Patient ambulates with decreased step length and arm swing. Patient    Balance: Patient is challenged by ambulation with turning, single leg stance, narrow base of support, eyes closed and amb with head turns activities.   FUNCTIONAL OUTCOME MEASURES:  Results Comments  Berg   48/56 In need of intervention  TUG/ Timed up and Go       11 seconds 13 sec carry, 13 sec cognitive <14 sec indicates increased risk for falls  5 times sit<>stand 14 sec using UE; 16 sec without UEs         >16 sec indicates increased risk for falls; in need of intervention        PT Short Term Goals - 01/10/19 1401      PT SHORT TERM GOAL #1   Title  Patient will be able to perform home program independently for self-management.    Time  4    Period  Weeks    Status  New    Target Date  02/05/19  PT Long Term Goals - 01/10/19 1402      PT LONG TERM GOAL #1   Title  Patient will reduce falls risk as indicated by Sharlene MottsBerg Balance Scale Score of > 50/56 to indicate decreased falls risk.    Baseline  scored 48/56 on 01/08/19    Time  8    Period  Weeks    Status  New    Target Date  02/21/19      PT LONG TERM GOAL #2   Title  Patient will complete five times sit to stand test in 13 seconds or less indicating an increased LE strength and improved balance.    Baseline  scored 16 seconds without use of UEs on 01/08/2019    Time  8    Period  Weeks    Status  New    Target Date  02/21/19      PT LONG TERM GOAL #3   Title  Patient will report 50% improvements in his finger dexterity as evidenced by improved hand writing ability and improved ability to work on his model trains    Time  8    Period  Weeks    Status  New    Target Date  02/21/19       Patient will benefit from skilled therapeutic intervention in order to improve the following deficits and impairments:     Visit Diagnosis: 1. Difficulty in walking, not elsewhere classified   2. Muscle weakness (generalized)       Plan - 01/10/19 1358    Clinical Impression Statement  Patient reports that he  was diagnosed with Parkinson's Disease in August 2018. Patient states that he has noticed that he has decreased endurance and strength and decreased balance. Patient reports that he likes to work on building model trains as his hobby, but states he has not been able to recently due to decreased dexterity in his fingers and decreased vision. Patient with listed functional deficits and would benefit from PT services to address goals as set on plan of care, to try to decrease falls risk and to address funtional deficits.    Personal Factors and Comorbidities  Comorbidity 3+;Age    Comorbidities  HTN, DM, OA, glaucoma, Parkinson's Disease, patient reports significan visual impairments due to macular degeneration    Examination-Participation Restrictions  Other   Hobby train building   Stability/Clinical Decision Making  Evolving/Moderate complexity    Clinical Decision Making  Moderate    Rehab Potential  Good    PT Frequency  4x / week    PT Duration  4 weeks    PT Treatment/Interventions  ADLs/Self Care Home Management;Stair training;Gait training;Neuromuscular re-education;Therapeutic activities;Therapeutic exercise;Balance training;Patient/family education    Consulted and Agree with Plan of Care  Patient         Problem List Patient Active Problem List   Diagnosis Date Noted   Loss of weight 04/20/2013   Greater trochanteric bursitis of left hip 03/07/2013   BPH (benign prostatic hypertrophy)    HTN (hypertension)    Diabetes type 2, uncontrolled (HCC)    Osteoarthritis    Glaucoma    HLD (hyperlipidemia)    Mardelle Matteorriea Jammy Plotkin PT, DPT [id number] Mardelle MatteMurphy,Melia Hopes 01/08/2019, 10:06 AM  [facility] [address] [place], [place], [address] Phone: [phone]   Fax:  [phone]  Name: Bradley GullyRonald Spates MRN: [id number] Date of Birth: 08-14-1935

## 2019-01-08 NOTE — Therapy (Signed)
[facility] [address] [place], [place], [address] Phone: [phone]   Fax:  [phone]  Speech Language Pathology Evaluation  Patient Details  Name: [name] MRN: [id number] Date of Birth: [birth year] Referring Provider (SLP): Dr. Sherryll BurgerShah   Encounter Date: 01/08/2019  End of Session - 01/08/19 1324    Visit Number  1    Number of Visits  17    Date for SLP Re-Evaluation  02/12/19    Authorization Type  Medicare    Authorization Time Period  Start 01/08/2019    Authorization - Visit Number  1    Authorization - Number of Visits  10    SLP Start Time  1100    SLP Stop Time   1145    SLP Time Calculation (min)  45 min    Activity Tolerance  Patient tolerated treatment well       Past Medical History:  Diagnosis Date   Anemia    BPH (benign prostatic hypertrophy)    Diabetes type 2, controlled (HCC)    GERD (gastroesophageal reflux disease)    RARE   Glaucoma    Syndor   Hearing loss    bilateral hearing aids occasionally   History of kidney stones    HLD (hyperlipidemia)    HTN (hypertension)    Macular degeneration    Right (Appenseler)   Osteoarthritis    thumbs, hips, knees (s/p R TKR)   PUD (peptic ulcer disease) 03/2013   2 antral gastric ulcers and 1 duodenal ulcer, NSAID related, [facility] with melena/anemia s/p EGD   Sleep apnea    CPAP   Tremor    intention    Past Surgical History:  Procedure Laterality Date   BELPHAROPTOSIS REPAIR  2012   carotid US  12/2011   no significant stenosis   CATARACT EXTRACTION Bilateral 2011, 2012   CHOLECYSTECTOMY  1997   COLONOSCOPY  2009   per patient, told no longer needed   CYSTOSCOPY W/ URETERAL STENT PLACEMENT Left 11/01/2016   Procedure: CYSTOSCOPY WITH STENT REPLACEMENT;  Surgeon: Vanna ScotlandBrandon, Ashley, MD;  Location: [facility] ORS;  Service: Urology;  Laterality: Left;   CYSTOSCOPY WITH STENT PLACEMENT Left 10/26/2016   Procedure: CYSTOSCOPY WITH STENT PLACEMENT;  Surgeon: Vanna ScotlandBrandon, Ashley, MD;  Location: [facility];  Service: Urology;  Laterality: Left;   ESOPHAGOGASTRODUODENOSCOPY  04/2013   hospitalization @ [facility], gastritis, gastric ulcers and duodenal ulcer with clean base (Rein)   HOLEP-LASER ENUCLEATION OF THE PROSTATE WITH MORCELLATION N/A 11/01/2016   Procedure: HOLEP-LASER ENUCLEATION OF THE PROSTATE WITH MORCELLATION;  Surgeon: Vanna ScotlandBrandon, Ashley, MD;  Location: [facility];  Service: Urology;  Laterality: N/A;   KIDNEY STONE SURGERY  83, 87, 90   TOTAL KNEE ARTHROPLASTY Right 2006   URETEROSCOPY Left 10/26/2016   Procedure: URETEROSCOPY;  Surgeon: Vanna ScotlandBrandon, Ashley, MD;  Location: [facility];  Service: Urology;  Laterality: Left;   URETEROSCOPY WITH HOLMIUM LASER LITHOTRIPSY Left 11/01/2016   Procedure: URETEROSCOPY WITH HOLMIUM LASER LITHOTRIPSY;  Surgeon: Vanna ScotlandBrandon, Ashley, MD;  Location: [facility];  Service: Urology;  Laterality: Left;   US ECHOCARDIOGRAPHY  12/2011   nl LV fxn, EF 60%, nl valves, dilated aortic root (40mm)    There were no vitals filed for this visit.      SLP Evaluation [facility] - 01/08/19 1317      SLP Visit Information   SLP Received On  01/08/19    Referring Provider (SLP)  Dr. Sherryll BurgerShah  Onset Date  06/09/2018    Medical Diagnosis  Parkinson's      Subjective   Subjective  Patient reports that his wife complains that he trails out when speaking.    Patient/Family Stated Goal  Talk loud enough to be heard      Pain Assessment   Currently in Pain?  No/denies      General Information   HPI  83 year old man with Parkinson's disease      Prior Functional Status   Cognitive/Linguistic Baseline  Within functional limits      Oral Motor/Sensory Function   Overall Oral Motor/Sensory Function  Appears within functional limits for tasks assessed      Motor Speech   Overall Motor Speech  Impaired    Respiration  Impaired    Level of Impairment  Conversation    Phonation  Low vocal  intensity    Resonance  Within functional limits    Articulation  Within functional limitis    Intelligibility  Intelligible    Phonation  Impaired    Vocal Abuses  Habitual Hyperphonia    Tension Present  Jaw;Neck;Shoulder    Volume  Soft    Pitch  Low      Standardized Assessments   Standardized Assessments   Other Assessment   LSVT-LOUD Voice Evaluation    LSVT-LOUD Voice Evaluation Maximum phonation time for sustained ah: 6 seconds Mean intensity during sustained ah: 70 dB  Mean intensity sustained during conversational speech: 65 dB Highest dynamic pitch when altering pitch from a low note to a high note: 280 Hz Highest pitch during conversational speech: 138 Hz Lowest dynamic pitch when altering from a high note to a low note: 107 Hz Lowest pitch during conversational speech: 104 Hz  Average fundamental frequency during sustained ah: 127 Hz (within 1 STD for age and gender) Visi-Pitch: Museum/gallery exhibitions officer (MDVP)  MDVP extracts objective quantitative values (Relative Average Perturbation, Shimmer, Voice Turbulence Index, and Noise to Harmonic Ratio) on sustained phonation, which are displayed graphically and numerically in comparison to a built-in normative database.  The patient exhibited values outside the norm for Relative Average Perturbation, Shimmer, Voice Turbulence Index, and Noise to Harmonic Ratio.  Average fundamental frequency was average for age and gender. Perceptually, his vocal quality was muffled.  The patient improved all parameters when cued to alter voicing (loud like me).  Simulatability: Improved vocal quality with loud voice.    SLP Education - 01/08/19 1324    Education Details  LSVT-LOUD purpose and protocol    Person(s) Educated  Patient;Spouse    Methods  Explanation    Comprehension  Verbalized understanding         SLP Long Term Goals - 01/08/19 1329      SLP LONG TERM GOAL #1   Title  The patient will complete Daily  Tasks (Maximum duration "ah", High/Lows, and Functional Phrases) at average loudness of 80 dB and with loud, good quality voice.    Time  4    Period  Weeks    Status  New    Target Date  02/12/19      SLP LONG TERM GOAL #2   Title  The patient will complete Hierarchal Speech Loudness reading drills (words/phrases, sentences, and paragraph) at average 75 dB and with loud, good quality voice.    Time  4    Period  Weeks    Status  New    Target Date  02/12/19  SLP LONG TERM GOAL #3   Title  The patient will participate in conversation, maintaining average loudness of 75 dB and loud, good quality voice.    Time  4    Period  Weeks    Status  New    Target Date  02/12/19      SLP LONG TERM GOAL #4   Title  The patient will complete homework daily.    Time  4    Period  Weeks    Status  New    Target Date  02/12/19       Plan - 01/08/19 1328    Clinical Impression Statement  This 83 year old man diagnosed with Parkinsons disease is presenting with moderate voice disorder characterized by hoarse vocal quality, hypophonia, and monotone voice.  Based on stimulability testing, the patient is judged to be a good candidate for the LSVT LOUD program.  It is recommended that the patient receive the LSVT LOUD program which is comprised of 16 intensive sessions (4 times per week for 4 weeks, one hour sessions).  Prognosis for improvement is good based on motivation, stimulability, and strong family support.  LSVT LOUD has been documented in the literature as efficacious for individuals with Parkinsons disease.    Speech Therapy Frequency  4x / week    Duration  4 weeks    Treatment/Interventions  SLP instruction and feedback;Patient/family education;Other (comment)   LSVT-LOUD   Potential to Achieve Goals  Good    Potential Considerations  Ability to learn/carryover information;Previous level of function;Co-morbidities;Severity of impairments;Cooperation/participation level;Medical  prognosis;Family/community support    SLP Home Exercise Plan  LSVT-LOUD daily homework    Consulted and Agree with Plan of Care  Patient;Family member/caregiver    Family Member Consulted  Spouse       Patient will benefit from skilled therapeutic intervention in order to improve the following deficits and impairments:   1. Dysphonia       Problem List Patient Active Problem List   Diagnosis Date Noted   Loss of weight 04/20/2013   Greater trochanteric bursitis of left hip 03/07/2013   BPH (benign prostatic hypertrophy)    HTN (hypertension)    Diabetes type 2, uncontrolled (HCC)    Osteoarthritis    Glaucoma    HLD (hyperlipidemia)    Dollene PrimroseSusan G Salvadore Valvano, MS/CCC- SLP  Leandrew KoyanagiAbernathy, Susie 01/08/2019, 1:35 PM  [facility] [address] [place], [place], [address] Phone: [phone]   Fax:  [phone]  Name: [name] MRN: [id number] Date of Birth: [birth year]

## 2019-01-10 ENCOUNTER — Other Ambulatory Visit: Payer: Self-pay

## 2019-01-10 ENCOUNTER — Ambulatory Visit (INDEPENDENT_AMBULATORY_CARE_PROVIDER_SITE_OTHER): Payer: Medicare Other | Admitting: Urology

## 2019-01-10 ENCOUNTER — Encounter: Payer: Self-pay | Admitting: Urology

## 2019-01-10 VITALS — BP 135/88 | HR 69 | Ht 66.0 in | Wt 204.0 lb

## 2019-01-10 DIAGNOSIS — N401 Enlarged prostate with lower urinary tract symptoms: Secondary | ICD-10-CM

## 2019-01-10 DIAGNOSIS — N138 Other obstructive and reflux uropathy: Secondary | ICD-10-CM

## 2019-01-10 DIAGNOSIS — N200 Calculus of kidney: Secondary | ICD-10-CM

## 2019-01-10 NOTE — Progress Notes (Signed)
01/10/2019 11:28 AM   [name] [birth year] [id number]  Referring provider: Kandyce [name], Marcus, MD [phone] [name] [place],  [place] [address]  Chief Complaint  Patient presents with  . Follow-up    HPI: 83 year old male who returns today for routine annual follow-up.  He was last seen on 12/2017.  His personal issue nephrolithiasis and BPH.  He status post holmium laser enucleation of the median lobe in 10/2016 out of necessity in order to access his ureteral orifice for staged ureteroscopy.  Postoperatively, he was weaned off of Flomax and finasteride were developed recurrent symptoms and restarted these medications.  He reports because he was doing so well overall, he stopped taking this medication about 6 weeks ago (stopped Flomax only) And has noticed no change in his symptoms.  He continues take finasteride.  He is pleased that he is able to cut back on his meds.  In addition to the above, he has a known history of nephrolithiasis.  He has a 1 cm left nonobstructing stone.  KUB today shows is unchanged from a year ago.  He is asymptomatic from this.  He has a personal history of obstructing left ureteral calculi x2 and underwent staged ureteroscopy in 10/2016.  He had no flank pain or gross hematuria today.  I did receive a MyChart message from his significant other prior to today's appointment indicating that he is done extremely well without any urinary complaints and no episodes of flank pain or gross hematuria.  She also reports that she has been cooking for him, has encouraged weight loss with which she has been successful and he is drinking significantly more water.    PMH: Past Medical History:  Diagnosis Date  . Anemia   . BPH (benign prostatic hypertrophy)   . Diabetes type 2, controlled (HCC)   . GERD (gastroesophageal reflux disease)    RARE  . Glaucoma    Syndor  . Hearing loss    bilateral hearing aids  occasionally  . History of kidney stones   . HLD (hyperlipidemia)   . HTN (hypertension)   . Macular degeneration    Right (Appenseler)  . Osteoarthritis    thumbs, hips, knees (s/p R TKR)  . PUD (peptic ulcer disease) 03/2013   2 antral gastric ulcers and 1 duodenal ulcer, NSAID related, [facility] with melena/anemia s/p EGD  . Sleep apnea    CPAP  . Tremor    intention    Surgical History: Past Surgical History:  Procedure Laterality Date  . BELPHAROPTOSIS REPAIR  2012  . carotid US  12/2011   no significant stenosis  . CATARACT EXTRACTION Bilateral 2011, 2012  . CHOLECYSTECTOMY  1997  . COLONOSCOPY  2009   per patient, told no longer needed  . CYSTOSCOPY W/ URETERAL STENT PLACEMENT Left 11/01/2016   Procedure: CYSTOSCOPY WITH STENT REPLACEMENT;  Surgeon: [name], Bradley Blahnik, MD;  Location: [name];  Service: Urology;  Laterality: Left;  . CYSTOSCOPY WITH STENT PLACEMENT Left 10/26/2016   Procedure: CYSTOSCOPY WITH STENT PLACEMENT;  Surgeon: [name], Matthias Bogus, MD;  Location: [name];  Service: Urology;  Laterality: Left;  . ESOPHAGOGASTRODUODENOSCOPY  04/2013   hospitalization @ [facility], gastritis, gastric ulcers and duodenal ulcer with clean base (Rein)  . HOLEP-LASER ENUCLEATION OF THE PROSTATE WITH MORCELLATION N/A 11/01/2016   Procedure: HOLEP-LASER ENUCLEATION OF THE PROSTATE WITH MORCELLATION;  Surgeon: [name], Bradley Denise, MD;  Location: [name];  Service: Urology;  Laterality: N/A;  .  KIDNEY STONE SURGERY  83, 87, 90  . TOTAL KNEE ARTHROPLASTY Right 2006  . URETEROSCOPY Left 10/26/2016   Procedure: URETEROSCOPY;  Surgeon: [name], Bradley Azzara, MD;  Location: [name];  Service: Urology;  Laterality: Left;  . URETEROSCOPY WITH HOLMIUM LASER LITHOTRIPSY Left 11/01/2016   Procedure: URETEROSCOPY WITH HOLMIUM LASER LITHOTRIPSY;  Surgeon: [name], Bradley Trabucco, MD;  Location: [name];  Service: Urology;  Laterality: Left;  . US ECHOCARDIOGRAPHY  12/2011   nl LV fxn, EF 60%, nl valves, dilated  aortic root (40mm)    Home Medications:  Allergies as of 01/10/2019      Reactions   Sulfa Antibiotics    As infant   Penicillins Rash      Medication List       Accurate as of January 10, 2019 11:28 AM. If you have any questions, ask your nurse or doctor.        STOP taking these medications   tamsulosin 0.4 MG Caps capsule Commonly known as: FLOMAX Stopped by: [facility] ScotlandAshley Danyal Adorno, MD     TAKE these medications   acetaminophen 500 MG tablet Commonly known as: TYLENOL Take 1,000 mg by mouth 2 (two) times daily.   atorvastatin 20 MG tablet Commonly known as: LIPITOR Take 20 mg by mouth every evening.   carbidopa-levodopa 25-100 MG tablet Commonly known as: SINEMET IR   Combigan 0.2-0.5 % ophthalmic solution Generic drug: brimonidine-timolol Place 1 drop into both eyes 2 (two) times daily.   finasteride 5 MG tablet Commonly known as: PROSCAR Take 5 mg by mouth every morning.   glimepiride 4 MG tablet Commonly known as: AMARYL Take 8 mg by mouth daily before breakfast.   HAIR/SKIN/NAILS PO Take 1 tablet by mouth 2 (two) times daily.   metFORMIN 500 MG 24 hr tablet Commonly known as: GLUCOPHAGE-XR Take 500 mg by mouth 2 (two) times daily.   PRESERVISION AREDS 2 PO Take 1 tablet by mouth 2 (two) times daily.   valsartan-hydrochlorothiazide 160-25 MG tablet Commonly known as: DIOVAN-HCT Take by mouth every morning. Takes 1/2 tablet by mouth daily       Allergies:  Allergies  Allergen Reactions  . Sulfa Antibiotics     As infant  . Penicillins Rash    Family History: Family History  Problem Relation Age of Onset  . Cancer Sister        lung, nonsmoker  . Cancer Sister        lung, nonsmoker  . Dementia Mother   . CAD Father        "heart problems"  . CAD Brother [id number]       MI  . Stroke Neg Hx   . Prostate cancer Neg Hx   . Bladder Cancer Neg Hx   . Kidney cancer Neg Hx     Social History:  reports that he quit smoking about 20 years ago. His  smoking use included cigarettes and cigars. He started smoking about 20 years ago. He has a 45.00 pack-year smoking history. He has never used smokeless tobacco. He reports current alcohol use. He reports that he does not use drugs.  ROS: UROLOGY Frequent Urination?: No Hard to postpone urination?: No Burning/pain with urination?: No Get up at night to urinate?: No Leakage of urine?: No Urine stream starts and stops?: No Trouble starting stream?: No Do you have to strain to urinate?: No Blood in urine?: No Urinary tract infection?: No Sexually transmitted disease?: No Injury to kidneys or bladder?: No Painful intercourse?: No Weak  stream?: No Erection problems?: No Penile pain?: No  Gastrointestinal Nausea?: No Vomiting?: No Indigestion/heartburn?: No Diarrhea?: No Constipation?: No  Constitutional Fever: No Night sweats?: No Weight loss?: No Fatigue?: No  Skin Skin rash/lesions?: No Itching?: No  Eyes Blurred vision?: No Double vision?: No  Ears/Nose/Throat Sore throat?: No Sinus problems?: No  Hematologic/Lymphatic Swollen glands?: No Easy bruising?: No  Cardiovascular Leg swelling?: No Chest pain?: No  Respiratory Cough?: No Shortness of breath?: No  Endocrine Excessive thirst?: No  Musculoskeletal Back pain?: No Joint pain?: No  Neurological Headaches?: No Dizziness?: No  Psychologic Depression?: No Anxiety?: No  Physical Exam: BP 135/88   Pulse 69   Ht 5\' 6"  (1.676 m)   Wt 204 lb (92.5 kg)   BMI 32.93 kg/m   Constitutional:  Alert and oriented, No acute distress. HEENT: [place] AT, moist mucus membranes.  Trachea midline, no masses. Cardiovascular: No clubbing, cyanosis, or edema. Respiratory: Normal respiratory effort, no increased work of breathing. Skin: No rashes, bruises or suspicious lesions. Neurologic: Grossly intact, no focal deficits, moving all 4 extremities. Psychiatric: Normal mood and affect.  Laboratory Data: Cr  0.8 7/20  Lab Results  Component Value Date   PSA 3.14 02/28/2013   PSA 2.9 12/01/2011    Urinalysis    Component Value Date/Time   COLORURINE Yellow 04/22/2013 1032   APPEARANCEUR Clear 12/03/2016 1115   LABSPEC 1.020 04/22/2013 1032   PHURINE 5.0 04/22/2013 1032   GLUCOSEU Negative 12/03/2016 1115   GLUCOSEU Negative 04/22/2013 1032   HGBUR Negative 04/22/2013 1032   BILIRUBINUR Negative 12/03/2016 1115   BILIRUBINUR Negative 04/22/2013 1032   KETONESUR Negative 04/22/2013 1032   PROTEINUR 2+ (A) 12/03/2016 1115   PROTEINUR Negative 04/22/2013 1032   NITRITE Negative 12/03/2016 1115   NITRITE Negative 04/22/2013 1032   LEUKOCYTESUR 1+ (A) 12/03/2016 1115   LEUKOCYTESUR Negative 04/22/2013 1032    Lab Results  Component Value Date   LABMICR See below: 12/03/2016   WBCUA 11-30 (A) 12/03/2016   RBCUA >30R 12/03/2016   LABEPIT 0-10 12/03/2016   BACTERIA Few (A) 12/03/2016    Pertinent Imaging: Results for orders placed during the hospital encounter of 01/04/19  DG Abd 1 View   Narrative CLINICAL DATA:  Kidney stones history of ureteroscopy with stent in 2018  EXAM: ABDOMEN - 1 VIEW  COMPARISON:  01/02/2018, 07/05/2017  FINDINGS: Surgical clips in the right upper quadrant. Nonobstructive bowel gas pattern. 15 mm calcification projecting over the left kidney. This does not appear significantly changed.  IMPRESSION: 1. No significant change in 15 mm calcification projecting over left kidney. 2. Nonobstructed gas pattern   Electronically Signed   By: Jasmine PangKim  Fujinaga M.D.   On: 01/04/2019 14:01    KUB image was personally reviewed.  This is unchanged from the previous year, stone measures 10 mm, not 15 mm as indicated above.  Assessment & Plan:    1. Kidney stones Asymptomatic stable left 10 mm stone This is unchanged and almost 2 years, as such, will follow clinically at this point Continue to encourage fluids  2. Benign prostatic hyperplasia with  urinary obstruction Overall doing extremely well in terms of urinary symptoms and overall health He believes that his weight loss and physical activity have improved his overall wellbeing No longer taking Flomax, continue to abstain from this medication as is not needed Would recommend continue finasteride for the time being in the setting of prostamegaly   Return in about 1 year (around 01/10/2020) for with  PA for IPSS/ PVR.  Hollice Espy, MD  [facility] [address] [place],  [address] [phone]

## 2019-01-15 ENCOUNTER — Other Ambulatory Visit: Payer: Self-pay

## 2019-01-15 ENCOUNTER — Ambulatory Visit: Payer: Medicare Other | Admitting: Physical Therapy

## 2019-01-15 ENCOUNTER — Ambulatory Visit: Payer: Medicare Other | Admitting: Speech Pathology

## 2019-01-15 ENCOUNTER — Encounter: Payer: Self-pay | Admitting: Physical Therapy

## 2019-01-15 DIAGNOSIS — M6281 Muscle weakness (generalized): Secondary | ICD-10-CM

## 2019-01-15 DIAGNOSIS — R490 Dysphonia: Secondary | ICD-10-CM | POA: Diagnosis not present

## 2019-01-15 DIAGNOSIS — R262 Difficulty in walking, not elsewhere classified: Secondary | ICD-10-CM

## 2019-01-15 NOTE — Therapy (Signed)
[facility] [address] [place], [place], [address] Phone: [phone]   Fax:  [phone]  Physical Therapy Treatment  Patient Details  Name: Bradley [name] MRN: [id number] Date of Birth: [birth year] Referring Provider (PT): Dr. Sherryll BurgerShah   Encounter Date: 01/15/2019  PT End of Session - 01/15/19 1657    Visit Number  2    Number of Visits  17    Date for PT Re-Evaluation  02/21/19    PT Start Time  1400    PT Stop Time  1500    PT Time Calculation (min)  60 min    Equipment Utilized During Treatment  --    Activity Tolerance  Patient tolerated treatment well    Behavior During Therapy  [facility] for tasks assessed/performed       Past Medical History:  Diagnosis Date  . Anemia   . BPH (benign prostatic hypertrophy)   . Diabetes type 2, controlled (HCC)   . GERD (gastroesophageal reflux disease)    RARE  . Glaucoma    Syndor  . Hearing loss    bilateral hearing aids occasionally  . History of kidney stones   . HLD (hyperlipidemia)   . HTN (hypertension)   . Macular degeneration    Right ([place])  . Osteoarthritis    thumbs, hips, knees (s/p R TKR)  . PUD (peptic ulcer disease) 03/2013   2 antral gastric ulcers and 1 duodenal ulcer, NSAID related, [facility] with melena/anemia s/p EGD  . Sleep apnea    CPAP  . Tremor    intention    Past Surgical History:  Procedure Laterality Date  . BELPHAROPTOSIS REPAIR  2012  . carotid US  12/2011   no significant stenosis  . CATARACT EXTRACTION Bilateral 2011, 2012  . CHOLECYSTECTOMY  1997  . COLONOSCOPY  2009   per patient, told no longer needed  . CYSTOSCOPY W/ URETERAL STENT PLACEMENT Left 11/01/2016   Procedure: CYSTOSCOPY WITH STENT REPLACEMENT;  Surgeon: Vanna ScotlandBrandon, Ashley, MD;  Location: [facility] ORS;  Service: Urology;  Laterality: Left;  . CYSTOSCOPY WITH STENT PLACEMENT Left 10/26/2016   Procedure: CYSTOSCOPY WITH STENT PLACEMENT;  Surgeon: Vanna ScotlandBrandon,  Ashley, MD;  Location: [facility] ORS;  Service: Urology;  Laterality: Left;  . ESOPHAGOGASTRODUODENOSCOPY  04/2013   hospitalization @ [facility], gastritis, gastric ulcers and duodenal ulcer with clean base ([place])  . HOLEP-LASER ENUCLEATION OF THE PROSTATE WITH MORCELLATION N/A 11/01/2016   Procedure: HOLEP-LASER ENUCLEATION OF THE PROSTATE WITH MORCELLATION;  Surgeon: Vanna ScotlandBrandon, Ashley, MD;  Location: [facility] ORS;  Service: Urology;  Laterality: N/A;  . KIDNEY STONE SURGERY  83, 87, 90  . TOTAL KNEE ARTHROPLASTY Right 2006  . URETEROSCOPY Left 10/26/2016   Procedure: URETEROSCOPY;  Surgeon: Vanna ScotlandBrandon, Ashley, MD;  Location: [facility] ORS;  Service: Urology;  Laterality: Left;  . URETEROSCOPY WITH HOLMIUM LASER LITHOTRIPSY Left 11/01/2016   Procedure: URETEROSCOPY WITH HOLMIUM LASER LITHOTRIPSY;  Surgeon: Vanna ScotlandBrandon, Ashley, MD;  Location: [facility] ORS;  Service: Urology;  Laterality: Left;  . US ECHOCARDIOGRAPHY  12/2011   nl LV fxn, EF 60%, nl valves, dilated aortic root (40mm)    There were no vitals filed for this visit.  Subjective Assessment - 01/15/19 1655    Subjective  Patient states that he tried to do his exercises over the weekend.    Pertinent History  Patient reports he was diagnosed with Parkinson's Disease December 23, 2016. Patient reports he has not noticed any weakness on one side of  his body compared to the other. Since the COVID epidemic, patient reports that he has become more physically active and has lost weight. Patient is gradually built up to now being able to walk 1 mile a day. Patient reports he was walking with a cane prior to COVID and now is ambulating without assistive device. Patient states he feels good. Patient states he lives in a retirement community, [facility], and was using the gym at first until COVID. Then, he started walking 3-4 days a week in April and towards the end of the month now he walks daily. Patient is on weight watchers program and has lost about 20 pounds. Patient reports that  before he lost weight and got in better shape his Parkinson's medication was not working as well and he was told that they might have to increase his dosage, but patient states he saw his neurologist recently and they did not need to adjust his medications. Patient usually uses [facility] for community mobility at times. Patient reports he has the most difficulty with dexterity in his fingertips and with stamina/endurance. 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 Patient wears glasses. Patient reports he only has 50-60% vision due to macular degeneration. 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 Patient gets tremors in the evening and when fatigued left hand tremors more than right.  Patient reports his hand writing has declined since his diagnosis. Patient reports that the medication helps his symptoms and that fatigue worsens his symptoms of Parkinson's. Patient reports that he has difficult with bending and walking. Patient would like to work on improving his balance, increase his walking endurance, be better able to work on model trains and with his speech. Patient is also enrolled in the LSVT LOUD program.       Neuromuscular Re-education:   LSVT: Patient seen for LSVT Daily Session Maximal Daily Exercises for facilitation/coordination of movement Maximum Sustained Movements are designed to rescale the amplitude of movement output for generalization to daily functional activities. Performed as follows for 1 set of 10 repetitions each multi-directional sustained movements: 1) Floor to ceiling 2) Side to side multidirectional- patient attempted to straighten leg but had difficult time with that component and patient reported he could feel stretching on his right hamstring muscle  Repetitive movements performed in standing and are designed to provide retraining effort needed for sustained muscle  activation in tasks. Performed as follows for 1 set of 10 repetitions each of multi-directional repetitive movements: 3) Step and reach forward step  4) Step and reach sideways step- Cue for forearm pronation  5) Step and reach backwards step- one lose of balance that patient was able to self-correct by touching chair for support. Patient required seated rest break after this exercise.  6) Rock and reach forward/backward- 2 losses of balance and cue for step size as initially patient was taking too large a step 7) Rock and reach sideways - patient needed modeling and verbal cuing for this exercise; patient was unsteady and required CGA  Patient required CGA to SBA for above exercises. Patient's balance challenged by some of the standing exercises.  Functional Component Task: 1.Sit to stand BIG functional component task with supervision 5 reps  2. Worked on big cursive handwriting 5 reps 3. Did not have time to get to additional component tasks this date 4. 5. Patient given daily carryover  task to complete. Patient to practice doing BIG cursive handwriting. Patient given practice sheet.   Ambulation: Patient ambulated 500' with cuing for BIG movements such as increased arm swing. Patient able to maintain BIG movements while carrying on a conversation.   PT Education - 01/15/19 1656    Education Details  reviewed LSVT BIG exercises and talked about daily homework task    Person(s) Educated  Patient    Methods  Explanation;Demonstration;Verbal cues    Comprehension  Verbalized understanding;Returned demonstration;Verbal cues required       PT Short Term Goals - 01/10/19 1401      PT SHORT TERM GOAL #1   Title  Patient will be able to perform home program independently for self-management.    Time  4    Period  Weeks    Status  New    Target Date  02/05/19        PT Long Term Goals - 01/10/19 1402      PT LONG TERM GOAL #1   Title  Patient will reduce falls risk as indicated by  Merrilee Jansky Balance Scale Score of > 50/56 to indicate decreased falls risk.    Baseline  scored 48/56 on 01/08/19    Time  8    Period  Weeks    Status  New    Target Date  02/21/19      PT LONG TERM GOAL #2   Title  Patient will complete five times sit to stand test in 13 seconds or less indicating an increased LE strength and improved balance.    Baseline  scored 16 seconds without use of UEs on 01/08/2019    Time  8    Period  Weeks    Status  New    Target Date  02/21/19      PT LONG TERM GOAL #3   Title  Patient will report 50% improvements in his finger dexterity as evidenced by improved hand writing ability and improved ability to work on his model trains    Time  Big Pool    Target Date  02/21/19            Plan - 01/15/19 1658    Clinical Impression Statement  Patient motivated to session and reports that he even attempted the exercise packet on his own over the weekend. Patient's balance was challenged by some of the standing exercises this date. Patient would benefit from continued practice of LSVT BIG exercises so that he will be able to perform as home exercise program upon discharge.    Personal Factors and Comorbidities  Comorbidity 3+;Age    Comorbidities  HTN, DM, OA, glaucoma, Parkinson's Disease, patient reports significan visual impairments due to macular degeneration    Examination-Participation Restrictions  Other   Hobby train building   Stability/Clinical Decision Making  Evolving/Moderate complexity    Rehab Potential  Good    PT Frequency  4x / week    PT Duration  4 weeks    PT Treatment/Interventions  ADLs/Self Care Home Management;Stair training;Gait training;Neuromuscular re-education;Therapeutic activities;Therapeutic exercise;Balance training;Patient/family education    Consulted and Agree with Plan of Care  Patient       Patient will benefit from skilled therapeutic intervention in order to improve the following deficits  and impairments:  Decreased balance, Decreased endurance, Decreased strength, Decreased mobility, Difficulty walking, Impaired vision/preception  Visit Diagnosis: Difficulty in walking, not elsewhere classified  Muscle  weakness (generalized)     Problem List Patient Active Problem List   Diagnosis Date Noted  . Loss of weight 04/20/2013  . Greater trochanteric bursitis of left hip 03/07/2013  . BPH (benign prostatic hypertrophy)   . HTN (hypertension)   . Diabetes type 2, uncontrolled (HCC)   . Osteoarthritis   . Glaucoma   . HLD (hyperlipidemia)    Mardelle Matteorriea  PT, DPT [phone]#[id number] Mardelle Matte, 01/15/2019, 5:09 PM  [facility] [facility] [address] [place], [place], [address] Phone: [phone]   Fax:  [phone]  Name: Bradley GullyRonald [name] MRN: [id number] Date of Birth: July 15, 1935

## 2019-01-16 ENCOUNTER — Encounter: Payer: Self-pay | Admitting: Speech Pathology

## 2019-01-16 ENCOUNTER — Ambulatory Visit: Payer: Medicare Other | Admitting: Speech Pathology

## 2019-01-16 ENCOUNTER — Encounter: Payer: Self-pay | Admitting: Physical Therapy

## 2019-01-16 ENCOUNTER — Other Ambulatory Visit: Payer: Self-pay

## 2019-01-16 ENCOUNTER — Ambulatory Visit: Payer: Medicare Other | Admitting: Physical Therapy

## 2019-01-16 DIAGNOSIS — R490 Dysphonia: Secondary | ICD-10-CM

## 2019-01-16 DIAGNOSIS — R262 Difficulty in walking, not elsewhere classified: Secondary | ICD-10-CM

## 2019-01-16 DIAGNOSIS — M6281 Muscle weakness (generalized): Secondary | ICD-10-CM

## 2019-01-16 NOTE — Therapy (Signed)
[place] MAIN [facility] SERVICES [address] [place], [place], [address] Phone: [phone]   Fax:  [phone]  Speech Language Pathology Treatment  Patient Details  Name: Bradley [name] MRN: [id number] Date of Birth: Jun 24, 1935 Referring Provider (SLP): Dr. Manuella Ghazi   Encounter Date: 01/15/2019  End of Session - 01/16/19 0918    Visit Number  2    Number of Visits  17    Date for SLP Re-Evaluation  02/12/19    Authorization Type  Medicare    Authorization Time Period  Start 01/08/2019    Authorization - Visit Number  2    Authorization - Number of Visits  10    SLP Start Time  1500    SLP Stop Time   1550    SLP Time Calculation (min)  50 min    Activity Tolerance  Patient tolerated treatment well       Past Medical History:  Diagnosis Date  . Anemia   . BPH (benign prostatic hypertrophy)   . Diabetes type 2, controlled ([facility])   . GERD (gastroesophageal reflux disease)    RARE  . Glaucoma    Syndor  . Hearing loss    bilateral hearing aids occasionally  . History of kidney stones   . HLD (hyperlipidemia)   . HTN (hypertension)   . Macular degeneration    Right ([place])  . Osteoarthritis    thumbs, hips, knees (s/p R TKR)  . PUD (peptic ulcer disease) 03/2013   2 antral gastric ulcers and 1 duodenal ulcer, NSAID related, admitted to [facility] with melena/anemia s/p EGD  . Sleep apnea    CPAP  . Tremor    intention    Past Surgical History:  Procedure Laterality Date  . [place]  2012  . carotid US  12/2011   no significant stenosis  . CATARACT EXTRACTION Bilateral 2011, 2012  . CHOLECYSTECTOMY  1997  . COLONOSCOPY  2009   per patient, told no longer needed  . CYSTOSCOPY W/ URETERAL STENT PLACEMENT Left 11/01/2016   Procedure: CYSTOSCOPY WITH STENT REPLACEMENT;  Surgeon: Hollice Espy, MD;  Location: [facility] ORS;  Service: Urology;  Laterality: Left;  . CYSTOSCOPY WITH STENT PLACEMENT Left 10/26/2016   Procedure: CYSTOSCOPY WITH STENT PLACEMENT;  Surgeon: Hollice Espy, MD;  Location: [facility] ORS;  Service: Urology;  Laterality: Left;  . ESOPHAGOGASTRODUODENOSCOPY  04/2013   hospitalization @ [place], gastritis, gastric ulcers and duodenal ulcer with clean base ([place])  . HOLEP-LASER ENUCLEATION OF THE PROSTATE WITH MORCELLATION N/A 11/01/2016   Procedure: HOLEP-LASER ENUCLEATION OF THE PROSTATE WITH MORCELLATION;  Surgeon: Hollice Espy, MD;  Location: [facility] ORS;  Service: Urology;  Laterality: N/A;  . KIDNEY STONE SURGERY  83, 87, 90  . TOTAL KNEE ARTHROPLASTY Right 2006  . URETEROSCOPY Left 10/26/2016   Procedure: URETEROSCOPY;  Surgeon: Hollice Espy, MD;  Location: [facility] ORS;  Service: Urology;  Laterality: Left;  . URETEROSCOPY WITH HOLMIUM LASER LITHOTRIPSY Left 11/01/2016   Procedure: URETEROSCOPY WITH HOLMIUM LASER LITHOTRIPSY;  Surgeon: Hollice Espy, MD;  Location: [facility] ORS;  Service: Urology;  Laterality: Left;  . US ECHOCARDIOGRAPHY  12/2011   nl LV fxn, EF 60%, nl valves, dilated aortic root (14mm)    There were no vitals filed for this visit.  Subjective Assessment - 01/16/19 0917    Subjective  "I can't talk like this- it's too loud"            ADULT SLP TREATMENT - 01/16/19 0001  General Information   Behavior/Cognition  Alert;Cooperative;Pleasant mood    HPI   83 year old man with Parkinson's disease       Treatment Provided   Treatment provided  Cognitive-Linquistic      Pain Assessment   Pain Assessment  No/denies pain      Cognitive-Linquistic Treatment   Treatment focused on  Voice    Skilled Treatment  Daily Task #1 (Maximum sustained "ah"): Average 16 seconds, 83 dB. Daily Task 2 (Maximum fundamental frequency range): Highs: 15 high pitched "ah" given max cues (250 Hz). Lows: 15 low pitched "ah" given max cues (115 Hz). Daily task #3 (Maximum speech loudness drill of functional phrases): Average 72 dB.  Hierarchal speech loudness drill:  Read  sentences, 73 dB.  Homework: assignments given.  Off the cuff remarks: average 65 dB.        Assessment / Recommendations / Plan   Plan  Continue with current plan of care      Progression Toward Goals   Progression toward goals  Progressing toward goals       SLP Education - 01/16/19 0917    Education Details  LSVT-LOUD, your perception of your loudness may be off    Person(s) Educated  Patient    Methods  Explanation    Comprehension  Verbalized understanding         SLP Long Term Goals - 01/08/19 1329      SLP LONG TERM GOAL #1   Title  The patient will complete Daily Tasks (Maximum duration "ah", High/Lows, and Functional Phrases) at average loudness of 80 dB and with loud, good quality voice.    Time  4    Period  Weeks    Status  New    Target Date  02/12/19      SLP LONG TERM GOAL #2   Title  The patient will complete Hierarchal Speech Loudness reading drills (words/phrases, sentences, and paragraph) at average 75 dB and with loud, good quality voice.    Time  4    Period  Weeks    Status  New    Target Date  02/12/19      SLP LONG TERM GOAL #3   Title  The patient will participate in conversation, maintaining average loudness of 75 dB and loud, good quality voice.    Time  4    Period  Weeks    Status  New    Target Date  02/12/19      SLP LONG TERM GOAL #4   Title  The patient will complete homework daily.    Time  4    Period  Weeks    Status  New    Target Date  02/12/19       Plan - 01/16/19 0933    Clinical Impression Statement  The patient is completing daily tasks and hierarchal speech drill tasks with loud, good quality voice given minimal cues.  He is resistant, at this point, to be louder in conversation.    Speech Therapy Frequency  4x / week    Duration  4 weeks    Treatment/Interventions  SLP instruction and feedback;Patient/family education;Other (comment)    Potential to Achieve Goals  Good    Potential Considerations  Ability to  learn/carryover information;Previous level of function;Co-morbidities;Severity of impairments;Cooperation/participation level;Medical prognosis;Family/community support    SLP Home Exercise Plan  LSVT-LOUD daily homework    Consulted and Agree with Plan of Care  Patient  Patient will benefit from skilled therapeutic intervention in order to improve the following deficits and impairments:   Dysphonia    Problem List Patient Active Problem List   Diagnosis Date Noted  . Loss of weight 04/20/2013  . Greater trochanteric bursitis of left hip 03/07/2013  . BPH (benign prostatic hypertrophy)   . HTN (hypertension)   . Diabetes type 2, uncontrolled (HCC)   . Osteoarthritis   . Glaucoma   . HLD (hyperlipidemia)    Dollene PrimroseSusan G Ebbie Sorenson, MS/CCC- SLP  Leandrew KoyanagiAbernathy, Susie 01/16/2019, 9:37 AM  [facility] [address] [place], [place], [address] Phone: [phone]   Fax:  [phone]   Name: Bradley [name] MRN: [id number] Date of Birth: 1935/12/13

## 2019-01-16 NOTE — Therapy (Signed)
[place] MAIN [facility] SERVICES [address] [place], [place], [address] Phone: [phone]   Fax:  [phone]  Speech Language Pathology Treatment  Patient Details  Name: Bradley [name] MRN: [id number] Date of Birth: 1935-12-03 Referring Provider (SLP): Dr. Manuella Ghazi   Encounter Date: 01/16/2019  End of Session - 01/16/19 1613    Visit Number  3    Number of Visits  17    Date for SLP Re-Evaluation  02/12/19    Authorization Type  Medicare    Authorization Time Period  Start 01/08/2019    Authorization - Visit Number  3    Authorization - Number of Visits  10    SLP Start Time  1500    SLP Stop Time   1550    SLP Time Calculation (min)  50 min    Activity Tolerance  Patient tolerated treatment well       Past Medical History:  Diagnosis Date  . Anemia   . BPH (benign prostatic hypertrophy)   . Diabetes type 2, controlled ([place])   . GERD (gastroesophageal reflux disease)    RARE  . Glaucoma    [place]  . Hearing loss    bilateral hearing aids occasionally  . History of kidney stones   . HLD (hyperlipidemia)   . HTN (hypertension)   . Macular degeneration    Right ([place])  . Osteoarthritis    thumbs, hips, knees (s/p R TKR)  . PUD (peptic ulcer disease) 03/2013   2 antral gastric ulcers and 1 duodenal ulcer, NSAID related, admitted to [facility] with melena/anemia s/p EGD  . Sleep apnea    CPAP  . Tremor    intention    Past Surgical History:  Procedure Laterality Date  . Tool  2012  . carotid US  12/2011   no significant stenosis  . CATARACT EXTRACTION Bilateral 2011, 2012  . CHOLECYSTECTOMY  1997  . COLONOSCOPY  2009   per patient, told no longer needed  . CYSTOSCOPY W/ URETERAL STENT PLACEMENT Left 11/01/2016   Procedure: CYSTOSCOPY WITH STENT REPLACEMENT;  Surgeon: Hollice Espy, MD;  Location: [facility];  Service: Urology;  Laterality: Left;  . CYSTOSCOPY WITH STENT PLACEMENT Left 10/26/2016   Procedure: CYSTOSCOPY WITH STENT PLACEMENT;  Surgeon: Hollice Espy, MD;  Location: [facility];  Service: Urology;  Laterality: Left;  . ESOPHAGOGASTRODUODENOSCOPY  04/2013   hospitalization @ [place], gastritis, gastric ulcers and duodenal ulcer with clean base ([place])  . HOLEP-LASER ENUCLEATION OF THE PROSTATE WITH MORCELLATION N/A 11/01/2016   Procedure: HOLEP-LASER ENUCLEATION OF THE PROSTATE WITH MORCELLATION;  Surgeon: Hollice Espy, MD;  Location: [facility];  Service: Urology;  Laterality: N/A;  . KIDNEY STONE SURGERY  83, 87, 90  . TOTAL KNEE ARTHROPLASTY Right 2006  . URETEROSCOPY Left 10/26/2016   Procedure: URETEROSCOPY;  Surgeon: Hollice Espy, MD;  Location: [facility];  Service: Urology;  Laterality: Left;  . URETEROSCOPY WITH HOLMIUM LASER LITHOTRIPSY Left 11/01/2016   Procedure: URETEROSCOPY WITH HOLMIUM LASER LITHOTRIPSY;  Surgeon: Hollice Espy, MD;  Location: [facility];  Service: Urology;  Laterality: Left;  . US ECHOCARDIOGRAPHY  12/2011   nl LV fxn, EF 60%, nl valves, dilated aortic root (21mm)    There were no vitals filed for this visit.  Subjective Assessment - 01/16/19 1612    Subjective  Patient states that he has overcome any "self-consciousness" about the LOUD exercises            ADULT SLP TREATMENT -  01/16/19 1610      General Information   Behavior/Cognition  Alert;Cooperative;Pleasant mood    HPI   83 year old man with Parkinson's disease       Treatment Provided   Treatment provided  Cognitive-Linquistic      Pain Assessment   Pain Assessment  No/denies pain      Cognitive-Linquistic Treatment   Treatment focused on  Voice    Skilled Treatment  Daily Task #1 (Maximum sustained "ah"): Average 16 seconds, 83 dB. Daily Task 2 (Maximum fundamental frequency range): Highs: 15 high pitched "ah" given max cues (270 Hz). Lows: 15 low pitched "ah" given max cues (100 Hz). Daily task #3 (Maximum speech loudness drill of functional phrases): Average 72 dB.   Hierarchal speech loudness drill:  Conversation, 68 dB.  Homework: assignments completed.  Off the cuff remarks: average 65 dB.        Assessment / Recommendations / Plan   Plan  Continue with current plan of care      Progression Toward Goals   Progression toward goals  Progressing toward goals       SLP Education - 01/16/19 1613    Education Details  LSVT-LOUD    Person(s) Educated  Patient    Methods  Explanation    Comprehension  Verbalized understanding         SLP Long Term Goals - 01/08/19 1329      SLP LONG TERM GOAL #1   Title  The patient will complete Daily Tasks (Maximum duration "ah", High/Lows, and Functional Phrases) at average loudness of 80 dB and with loud, good quality voice.    Time  4    Period  Weeks    Status  New    Target Date  02/12/19      SLP LONG TERM GOAL #2   Title  The patient will complete Hierarchal Speech Loudness reading drills (words/phrases, sentences, and paragraph) at average 75 dB and with loud, good quality voice.    Time  4    Period  Weeks    Status  New    Target Date  02/12/19      SLP LONG TERM GOAL #3   Title  The patient will participate in conversation, maintaining average loudness of 75 dB and loud, good quality voice.    Time  4    Period  Weeks    Status  New    Target Date  02/12/19      SLP LONG TERM GOAL #4   Title  The patient will complete homework daily.    Time  4    Period  Weeks    Status  New    Target Date  02/12/19       Plan - 01/16/19 1613    Clinical Impression Statement  The patient is completing daily tasks and hierarchal speech drill tasks with loud, good quality voice given minimal cues.  He is less resistant to be louder in conversation.    Speech Therapy Frequency  4x / week    Duration  4 weeks    Treatment/Interventions  SLP instruction and feedback;Patient/family education;Other (comment)    Potential to Achieve Goals  Good    Potential Considerations  Ability to learn/carryover  information;Previous level of function;Co-morbidities;Severity of impairments;Cooperation/participation level;Medical prognosis;Family/community support    SLP Home Exercise Plan  LSVT-LOUD daily homework    Consulted and Agree with Plan of Care  Patient       Patient will benefit  from skilled therapeutic intervention in order to improve the following deficits and impairments:   Dysphonia    Problem List Patient Active Problem List   Diagnosis Date Noted  . Loss of weight 04/20/2013  . Greater trochanteric bursitis of left hip 03/07/2013  . BPH (benign prostatic hypertrophy)   . HTN (hypertension)   . Diabetes type 2, uncontrolled ([facility])   . Osteoarthritis   . Glaucoma   . HLD (hyperlipidemia)    Dollene Primrose, MS/CCC- SLP  Leandrew Koyanagi 01/16/2019, 4:14 PM  [facility] MAIN [facility] SERVICES [address] [place], [place], [address] Phone: [phone]   Fax:  [phone]   Name: Bradley [name] MRN: [id number] Date of Birth: 05/28/1935

## 2019-01-16 NOTE — Therapy (Signed)
[place] MAIN [facility] SERVICES [address] [place], [place], [address] Phone: [phone]   Fax:  [phone]  Physical Therapy Treatment  Patient Details  Name: Bradley [name] MRN: [id number] Date of Birth: 04-02-1936 Referring Provider (PT): Dr. Manuella Ghazi   Encounter Date: 01/16/2019  PT End of Session - 01/17/19 1201    Visit Number  3    Number of Visits  17    Date for PT Re-Evaluation  02/21/19    PT Start Time  9323    PT Stop Time  1459    PT Time Calculation (min)  56 min    Activity Tolerance  Patient tolerated treatment well    Behavior During Therapy  [facility] for tasks assessed/performed       Past Medical History:  Diagnosis Date  . Anemia   . BPH (benign prostatic hypertrophy)   . Diabetes type 2, controlled ([place])   . GERD (gastroesophageal reflux disease)    RARE  . Glaucoma    [place]  . Hearing loss    bilateral hearing aids occasionally  . History of kidney stones   . HLD (hyperlipidemia)   . HTN (hypertension)   . Macular degeneration    Right ([place])  . Osteoarthritis    thumbs, hips, knees (s/p R TKR)  . PUD (peptic ulcer disease) 03/2013   2 antral gastric ulcers and 1 duodenal ulcer, NSAID related, admitted to [facility] with melena/anemia s/p EGD  . Sleep apnea    CPAP  . Tremor    intention    Past Surgical History:  Procedure Laterality Date  . [place]  2012  . carotid US  12/2011   no significant stenosis  . CATARACT EXTRACTION Bilateral 2011, 2012  . CHOLECYSTECTOMY  1997  . COLONOSCOPY  2009   per patient, told no longer needed  . CYSTOSCOPY W/ URETERAL STENT PLACEMENT Left 11/01/2016   Procedure: CYSTOSCOPY WITH STENT REPLACEMENT;  Surgeon: Hollice Espy, MD;  Location: [facility];  Service: Urology;  Laterality: Left;  . CYSTOSCOPY WITH STENT PLACEMENT Left 10/26/2016   Procedure: CYSTOSCOPY WITH STENT PLACEMENT;  Surgeon: Hollice Espy, MD;  Location: [facility];  Service:  Urology;  Laterality: Left;  . ESOPHAGOGASTRODUODENOSCOPY  04/2013   hospitalization @ [place], gastritis, gastric ulcers and duodenal ulcer with clean base ([place])  . HOLEP-LASER ENUCLEATION OF THE PROSTATE WITH MORCELLATION N/A 11/01/2016   Procedure: HOLEP-LASER ENUCLEATION OF THE PROSTATE WITH MORCELLATION;  Surgeon: Hollice Espy, MD;  Location: [facility];  Service: Urology;  Laterality: N/A;  . KIDNEY STONE SURGERY  83, 87, 90  . TOTAL KNEE ARTHROPLASTY Right 2006  . URETEROSCOPY Left 10/26/2016   Procedure: URETEROSCOPY;  Surgeon: Hollice Espy, MD;  Location: [facility];  Service: Urology;  Laterality: Left;  . URETEROSCOPY WITH HOLMIUM LASER LITHOTRIPSY Left 11/01/2016   Procedure: URETEROSCOPY WITH HOLMIUM LASER LITHOTRIPSY;  Surgeon: Hollice Espy, MD;  Location: [facility];  Service: Urology;  Laterality: Left;  . US ECHOCARDIOGRAPHY  12/2011   nl LV fxn, EF 60%, nl valves, dilated aortic root (36mm)    There were no vitals filed for this visit.  Subjective Assessment - 01/16/19 1405    Subjective  Patient states he went to bed early last night and slept in late as he was so tired from doing the exercises yesterday. Patient states he did his homework.    Pertinent History  Patient reports he was diagnosed with Parkinson's Disease December 23, 2016. Patient reports he has  not noticed any weakness on one side of his body compared to the other. Since the COVID epidemic, patient reports that he has become more physically active and has lost weight. Patient is gradually built up to now being able to walk 1 mile a day. Patient reports he was walking with a cane prior to COVID and now is ambulating without assistive device. Patient states he feels good. Patient states he lives in a retirement community, [facility], and was using the gym at first until COVID. Then, he started walking 3-4 days a week in April and towards the end of the month now he walks daily. Patient is on weight watchers program and  has lost about 20 pounds. Patient reports that before he lost weight and got in better shape his Parkinson's medication was not working as well and he was told that they might have to increase his dosage, but patient states he saw his neurologist recently and they did not need to adjust his medications. Patient usually uses [facility] for community mobility at times. Patient reports he has the most difficulty with dexterity in his fingertips and with stamina/endurance. 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 Patient wears glasses. Patient reports he only has 50-60% vision due to macular degeneration. 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 Patient gets tremors in the evening and when fatigued left hand tremors more than right.  Patient reports his hand writing has declined since his diagnosis. Patient reports that the medication helps his symptoms and that fatigue worsens his symptoms of Parkinson's. Patient reports that he has difficult with bending and walking. Patient would like to work on improving his balance, increase his walking endurance, be better able to work on model trains and with his speech. Patient is also enrolled in the LSVT LOUD program.       LSVT: Patient seen for LSVT Daily Session Maximal Daily Exercises for facilitation/coordination of movement Maximum Sustained Movements are designed to rescale the amplitude of movement output for generalization to daily functional activities. Performed as follows for 1 set of 10 repetitions each multi-directional sustained movements: 1) Floor to ceiling  2) Side to side multidirectional   Repetitive movements performed in standing and are designed to provide retraining effort needed for sustained muscle activation in tasks. Performed as follows for 1 set of 10 repetitions each of multi-directional repetitive movements: 3) Step and reach forward  step- cue to not take too big a step as he had two small losses of balance.  4) Step and reach sideways step   5) Step and reach backwards step- modeling and cuing required for sequencing.  6) Rock and reach forward/backward- modeling to coordinate the movement and for patient to shift weight to forward foot and then back instead of bending forward at the hips and leaning. 7) Rock and reach sideways-patient better able to rotate trunk and head to follow leading hand this date.   Patient continues to be mildly unsteady requiring CGA with all standing exercises.   Functional Component Task: 1.Sit to stand BIG functional component task with supervision 5 reps  2. Performed BIG cursive handwriting 5 reps 3. Performed peg board task of selecting specific colored pegs and placing them in a pattern on the board  4. Discussed with patient additional items that he is having difficulty with- will try walking on red foam mat next  session as pt reports it is difficult fro him to walk on uneven sidewalks.  5. Did not have time to complete this date  Patient given daily carryover task to complete. Patient to practice doing fine motor coordination task of picking up pills one at a time from a bowl and putting them back into the bottle.   BIG Ambulation: Patient ambulated 675" using BIG movement patterns while doing naming tasks.   PT Education - 01/17/19 1200    Education Details  reviewed LSVT BIG exercises, discussed daily homework task, added functional component task    Person(s) Educated  Patient    Methods  Explanation;Demonstration;Verbal cues    Comprehension  Verbalized understanding;Returned demonstration;Verbal cues required       PT Short Term Goals - 01/10/19 1401      PT SHORT TERM GOAL #1   Title  Patient will be able to perform home program independently for self-management.    Time  4    Period  Weeks    Status  New    Target Date  02/05/19        PT Long Term Goals -  01/10/19 1402      PT LONG TERM GOAL #1   Title  Patient will reduce falls risk as indicated by Sharlene Motts Balance Scale Score of > 50/56 to indicate decreased falls risk.    Baseline  scored 48/56 on 01/08/19    Time  8    Period  Weeks    Status  New    Target Date  02/21/19      PT LONG TERM GOAL #2   Title  Patient will complete five times sit to stand test in 13 seconds or less indicating an increased LE strength and improved balance.    Baseline  scored 16 seconds without use of UEs on 01/08/2019    Time  8    Period  Weeks    Status  New    Target Date  02/21/19      PT LONG TERM GOAL #3   Title  Patient will report 50% improvements in his finger dexterity as evidenced by improved hand writing ability and improved ability to work on his model trains    Time  8    Period  Weeks    Status  New    Target Date  02/21/19            Plan - 01/17/19 1202    Clinical Impression Statement  Patient gradually improving with technique wtih LSVT BIG daily exercises but continues to required CGA secondary to mildly unsteady at times during standing exercises. In addition, patient continues to require modeling and cuing for technique at times during the LSVT exercises. Patient does well with maintaining BIG movements during ambulation but his pace did slow while doing naming tasks this date. Patient would benefit from continued PT services to further reinforce LSVT BIG exercises so that patient will be able to perform correctly and independently at home.    Personal Factors and Comorbidities  Comorbidity 3+;Age    Comorbidities  HTN, DM, OA, glaucoma, Parkinson's Disease, patient reports significan visual impairments due to macular degeneration    Examination-Participation Restrictions  Other   Hobby train building   Stability/Clinical Decision Making  Evolving/Moderate complexity    Rehab Potential  Good    PT Frequency  4x / week    PT Duration  4 weeks    PT Treatment/Interventions   ADLs/Self Care Home  Management;Stair training;Gait training;Neuromuscular re-education;Therapeutic activities;Therapeutic exercise;Balance training;Patient/family education    Consulted and Agree with Plan of Care  Patient       Patient will benefit from skilled therapeutic intervention in order to improve the following deficits and impairments:  Decreased balance, Decreased endurance, Decreased strength, Decreased mobility, Difficulty walking, Impaired vision/preception  Visit Diagnosis: Difficulty in walking, not elsewhere classified  Muscle weakness (generalized)     Problem List Patient Active Problem List   Diagnosis Date Noted  . Loss of weight 04/20/2013  . Greater trochanteric bursitis of left hip 03/07/2013  . BPH (benign prostatic hypertrophy)   . HTN (hypertension)   . Diabetes type 2, uncontrolled ([facility])   . Osteoarthritis   . Glaucoma   . HLD (hyperlipidemia)    Mardelle Matteorriea Rosalio Catterton PT, DPT [phone] Mardelle MatteMurphy,Pammy Vesey 01/17/2019, 12:16 PM  [place] [facility] [address] [place], [place], [address] Phone: [phone]   Fax:  [phone]  Name: Bradley [name] MRN: [id number] Date of Birth: [birth year]

## 2019-01-17 ENCOUNTER — Encounter: Payer: Self-pay | Admitting: Physical Therapy

## 2019-01-17 ENCOUNTER — Other Ambulatory Visit: Payer: Self-pay

## 2019-01-17 ENCOUNTER — Encounter: Payer: Self-pay | Admitting: Speech Pathology

## 2019-01-17 ENCOUNTER — Ambulatory Visit: Payer: Medicare Other | Admitting: Speech Pathology

## 2019-01-17 ENCOUNTER — Ambulatory Visit: Payer: Medicare Other | Admitting: Physical Therapy

## 2019-01-17 DIAGNOSIS — R490 Dysphonia: Secondary | ICD-10-CM

## 2019-01-17 DIAGNOSIS — M6281 Muscle weakness (generalized): Secondary | ICD-10-CM

## 2019-01-17 DIAGNOSIS — R262 Difficulty in walking, not elsewhere classified: Secondary | ICD-10-CM

## 2019-01-17 NOTE — Therapy (Signed)
[employer] MAIN [facility] SERVICES [address] [place], [place], [address] Phone: [phone]   Fax:  [phone]  Speech Language Pathology Treatment  Patient Details  Name: Bradley [name] MRN: [id number] Date of Birth: [birth year] Referring Provider (SLP): Dr. Manuella Ghazi   Encounter Date: 01/17/2019  End of Session - 01/17/19 1639    Visit Number  4    Number of Visits  17    Date for SLP Re-Evaluation  02/12/19    Authorization Type  Medicare    Authorization Time Period  Start 01/08/2019    Authorization - Visit Number  4    Authorization - Number of Visits  10    SLP Start Time  1500    SLP Stop Time   1550    SLP Time Calculation (min)  50 min    Activity Tolerance  Patient tolerated treatment well       Past Medical History:  Diagnosis Date  . Anemia   . BPH (benign prostatic hypertrophy)   . Diabetes type 2, controlled ([place])   . GERD (gastroesophageal reflux disease)    RARE  . Glaucoma    [place]  . Hearing loss    bilateral hearing aids occasionally  . History of kidney stones   . HLD (hyperlipidemia)   . HTN (hypertension)   . Macular degeneration    Right ([place])  . Osteoarthritis    thumbs, hips, knees (s/p R TKR)  . PUD (peptic ulcer disease) 03/2013   2 antral gastric ulcers and 1 duodenal ulcer, NSAID related, admitted to [facility] with melena/anemia s/p EGD  . Sleep apnea    CPAP  . Tremor    intention    Past Surgical History:  Procedure Laterality Date  . [place]  2012  . carotid US  12/2011   no significant stenosis  . CATARACT EXTRACTION Bilateral 2011, 2012  . CHOLECYSTECTOMY  1997  . COLONOSCOPY  2009   per patient, told no longer needed  . CYSTOSCOPY W/ URETERAL STENT PLACEMENT Left 11/01/2016   Procedure: CYSTOSCOPY WITH STENT REPLACEMENT;  Surgeon: Hollice Espy, MD;  Location: [facility];  Service: Urology;  Laterality: Left;  . CYSTOSCOPY WITH STENT PLACEMENT Left 10/26/2016   Procedure: CYSTOSCOPY WITH STENT PLACEMENT;  Surgeon: Hollice Espy, MD;  Location: [facility];  Service: Urology;  Laterality: Left;  . ESOPHAGOGASTRODUODENOSCOPY  04/2013   hospitalization @ [facility], gastritis, gastric ulcers and duodenal ulcer with clean base ([place])  . HOLEP-LASER ENUCLEATION OF THE PROSTATE WITH MORCELLATION N/A 11/01/2016   Procedure: HOLEP-LASER ENUCLEATION OF THE PROSTATE WITH MORCELLATION;  Surgeon: Hollice Espy, MD;  Location: [facility];  Service: Urology;  Laterality: N/A;  . KIDNEY STONE SURGERY  83, 87, 90  . TOTAL KNEE ARTHROPLASTY Right 2006  . URETEROSCOPY Left 10/26/2016   Procedure: URETEROSCOPY;  Surgeon: Hollice Espy, MD;  Location: [facility];  Service: Urology;  Laterality: Left;  . URETEROSCOPY WITH HOLMIUM LASER LITHOTRIPSY Left 11/01/2016   Procedure: URETEROSCOPY WITH HOLMIUM LASER LITHOTRIPSY;  Surgeon: Hollice Espy, MD;  Location: [facility];  Service: Urology;  Laterality: Left;  . US ECHOCARDIOGRAPHY  12/2011   nl LV fxn, EF 60%, nl valves, dilated aortic root (3mm)    There were no vitals filed for this visit.  Subjective Assessment - 01/17/19 1638    Subjective  "Scaring the dog" while doing his vocal exercises            ADULT SLP TREATMENT - 01/17/19 0001  General Information   Behavior/Cognition  Alert;Cooperative;Pleasant mood    HPI   83 year old man with Parkinson's disease       Treatment Provided   Treatment provided  Cognitive-Linquistic      Pain Assessment   Pain Assessment  No/denies pain      Cognitive-Linquistic Treatment   Treatment focused on  Voice    Skilled Treatment  Daily Task #1 (Maximum sustained "ah"): Average 13 seconds, 80 dB. Daily Task 2 (Maximum fundamental frequency range): Highs: 15 high pitched "ah" given max cues (260 Hz). Lows: 15 low pitched "ah" given max cues (105 Hz). Daily task #3 (Maximum speech loudness drill of functional phrases): Average 72 dB.  Hierarchal speech loudness drill:   Conversation, 68 dB.  Homework: assignments completed.  Off the cuff remarks: average 72 dB.        Assessment / Recommendations / Plan   Plan  Continue with current plan of care      Progression Toward Goals   Progression toward goals  Progressing toward goals       SLP Education - 01/17/19 1639    Education Details  LSVT-LOUD    Person(s) Educated  Patient    Methods  Explanation    Comprehension  Verbalized understanding         SLP Long Term Goals - 01/08/19 1329      SLP LONG TERM GOAL #1   Title  The patient will complete Daily Tasks (Maximum duration "ah", High/Lows, and Functional Phrases) at average loudness of 80 dB and with loud, good quality voice.    Time  4    Period  Weeks    Status  New    Target Date  02/12/19      SLP LONG TERM GOAL #2   Title  The patient will complete Hierarchal Speech Loudness reading drills (words/phrases, sentences, and paragraph) at average 75 dB and with loud, good quality voice.    Time  4    Period  Weeks    Status  New    Target Date  02/12/19      SLP LONG TERM GOAL #3   Title  The patient will participate in conversation, maintaining average loudness of 75 dB and loud, good quality voice.    Time  4    Period  Weeks    Status  New    Target Date  02/12/19      SLP LONG TERM GOAL #4   Title  The patient will complete homework daily.    Time  4    Period  Weeks    Status  New    Target Date  02/12/19       Plan - 01/17/19 1640    Clinical Impression Statement  The patient is completing daily tasks and hierarchal speech drill tasks with loud, good quality voice given minimal cues.  He is less resistant to be louder in conversation.    Speech Therapy Frequency  4x / week    Duration  4 weeks    Treatment/Interventions  SLP instruction and feedback;Patient/family education;Other (comment)    Potential to Achieve Goals  Good    Potential Considerations  Ability to learn/carryover information;Previous level of  function;Co-morbidities;Severity of impairments;Cooperation/participation level;Medical prognosis;Family/community support    SLP Home Exercise Plan  LSVT-LOUD daily homework    Consulted and Agree with Plan of Care  Patient       Patient will benefit from skilled therapeutic intervention in order to  improve the following deficits and impairments:   Dysphonia    Problem List Patient Active Problem List   Diagnosis Date Noted  . Loss of weight 04/20/2013  . Greater trochanteric bursitis of left hip 03/07/2013  . BPH (benign prostatic hypertrophy)   . HTN (hypertension)   . Diabetes type 2, uncontrolled ([facility])   . Osteoarthritis   . Glaucoma   . HLD (hyperlipidemia)    Dollene PrimroseSusan G Adir Schicker, MS/CCC- SLP  Leandrew KoyanagiAbernathy, Susie 01/17/2019, 4:40 PM  [facility] [address] [place], [place], [address] Phone: [phone]   Fax:  [phone]   Name: Bradley [name] MRN: [id number] Date of Birth: [birth year]

## 2019-01-17 NOTE — Therapy (Signed)
[facility] MAIN [facility] SERVICES [address] [place], [place], [address] Phone: [phone]   Fax:  [phone]  Physical Therapy Treatment  Patient Details  Name: Bradley [name] MRN: [id number] Date of Birth: Jul 04, 1935 Referring Provider (PT): Dr. Sherryll Burger   Encounter Date: 01/17/2019  PT End of Session - 01/17/19 1356    Visit Number  4    Number of Visits  17    Date for PT Re-Evaluation  02/21/19    PT Start Time  1354    PT Stop Time  1455    PT Time Calculation (min)  61 min    Equipment Utilized During Treatment  Gait belt    Activity Tolerance  Patient tolerated treatment well    Behavior During Therapy  [facility] for tasks assessed/performed       Past Medical History:  Diagnosis Date  . Anemia   . BPH (benign prostatic hypertrophy)   . Diabetes type 2, controlled ([facility])   . GERD (gastroesophageal reflux disease)    RARE  . Glaucoma    [place]  . Hearing loss    bilateral hearing aids occasionally  . History of kidney stones   . HLD (hyperlipidemia)   . HTN (hypertension)   . Macular degeneration    Right ([place])  . Osteoarthritis    thumbs, hips, knees (s/p R TKR)  . PUD (peptic ulcer disease) 03/2013   2 antral gastric ulcers and 1 duodenal ulcer, NSAID related, admitted to [facility] with melena/anemia s/p EGD  . Sleep apnea    CPAP  . Tremor    intention    Past Surgical History:  Procedure Laterality Date  . BELPHAROPTOSIS REPAIR  2012  . carotid US  12/2011   no significant stenosis  . CATARACT EXTRACTION Bilateral 2011, 2012  . CHOLECYSTECTOMY  1997  . COLONOSCOPY  2009   per patient, told no longer needed  . CYSTOSCOPY W/ URETERAL STENT PLACEMENT Left 11/01/2016   Procedure: CYSTOSCOPY WITH STENT REPLACEMENT;  Surgeon: [name], MD;  Location: [facility];  Service: Urology;  Laterality: Left;  . CYSTOSCOPY WITH STENT PLACEMENT Left 10/26/2016   Procedure: CYSTOSCOPY WITH STENT PLACEMENT;  Surgeon:  [name], MD;  Location: [facility];  Service: Urology;  Laterality: Left;  . ESOPHAGOGASTRODUODENOSCOPY  04/2013   hospitalization @ [facility], gastritis, gastric ulcers and duodenal ulcer with clean base ([place])  . HOLEP-LASER ENUCLEATION OF THE PROSTATE WITH MORCELLATION N/A 11/01/2016   Procedure: HOLEP-LASER ENUCLEATION OF THE PROSTATE WITH MORCELLATION;  Surgeon: [name], MD;  Location: [facility];  Service: Urology;  Laterality: N/A;  . KIDNEY STONE SURGERY  83, 87, 90  . TOTAL KNEE ARTHROPLASTY Right 2006  . URETEROSCOPY Left 10/26/2016   Procedure: URETEROSCOPY;  Surgeon: [name], MD;  Location: [facility];  Service: Urology;  Laterality: Left;  . URETEROSCOPY WITH HOLMIUM LASER LITHOTRIPSY Left 11/01/2016   Procedure: URETEROSCOPY WITH HOLMIUM LASER LITHOTRIPSY;  Surgeon: [name], MD;  Location: [facility];  Service: Urology;  Laterality: Left;  . US ECHOCARDIOGRAPHY  12/2011   nl LV fxn, EF 60%, nl valves, dilated aortic root (21mm)    There were no vitals filed for this visit.  Subjective Assessment - 01/17/19 1353    Subjective  Patient states he was not as stiff/sore when he woke up this morning and states he got up at his normal time of 7am. Patient reports he did pill sorting activity.    Pertinent History  Patient reports he  was diagnosed with Parkinson's Disease December 23, 2016. Patient reports he has not noticed any weakness on one side of his body compared to the other. Since the COVID epidemic, patient reports that he has become more physically active and has lost weight. Patient is gradually built up to now being able to walk 1 mile a day. Patient reports he was walking with a cane prior to COVID and now is ambulating without assistive device. Patient states he feels good. Patient states he lives in a retirement community, [facility], and was using the gym at first until COVID. Then, he started walking 3-4 days a week in April and towards the end of the month now  he walks daily. Patient is on weight watchers program and has lost about 20 pounds. Patient reports that before he lost weight and got in better shape his Parkinson's medication was not working as well and he was told that they might have to increase his dosage, but patient states he saw his neurologist recently and they did not need to adjust his medications. Patient usually uses [facility] for community mobility at times. Patient reports he has the most difficulty with dexterity in his fingertips and with stamina/endurance. 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 Patient wears glasses. Patient reports he only has 50-60% vision due to macular degeneration. 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 Patient gets tremors in the evening and when fatigued left hand tremors more than right.  Patient reports his hand writing has declined since his diagnosis. Patient reports that the medication helps his symptoms and that fatigue worsens his symptoms of Parkinson's. Patient reports that he has difficult with bending and walking. Patient would like to work on improving his balance, increase his walking endurance, be better able to work on model trains and with his speech. Patient is also enrolled in the LSVT LOUD program.          LSVT: Patient seen for LSVT Daily Session Maximal Daily Exercises for facilitation/coordination of movement Maximum Sustained Movements are designed to rescale the amplitude of movement output for generalization to daily functional activities. Performed as follows for 1 set of 10 repetitions each multi-directional sustained movements: 1) Floor to ceiling  2) Side to side multidirectional- Cue to extend leg able to supine with modeling cue only this date  Repetitive movements performed in standing and are designed to provide retraining effort needed for sustained muscle activation in  tasks. Performed as follows for 1 set of 10 repetitions each of multi-directional repetitive movements: 3) Step and reach forward step- 4) Step and reach sideways step- Cue to lift both arms as initially he was only lifting the arm on the side he was stepping towards 5) Step and reach backwards step- cues and modeling for technique and then needed to perform at a little slower pace 6) Rock and reach forward/backward- better able to shift weight forward/backward cuing to increase arm flexion 7) Rock and reach sideways- better able to rotate head and trunk this date.  Functional Component Task: 1.Sit to stand BIG functional component task with supervision 5 reps  2. Practiced BIG handwriting in cursive font of patient's name 5 reps 3. Practiced small hollow beads picking up one at a time as called out by therapist and placing them on the small green, plastic board 4. Practiced walking on red foam mat  5. Practiced stepping onto and off red foam mat 5 reps  Pt had one time when his left foot caught the edge of the mat and was able to self-correct lose of balance with CGA.    Patient given daily carryover task to complete. Patient to practice doing pill sorting but to try to practice selecting a pill and then trying to pick up the selected pill from the pile and then putting that pill into the bottle one at a time.   BIG ambulation:  Patient ambulated 1000' while doing ABC naming task while trying to maintain BIG movement patterns. Patient able to maintain BIG movements but gait speed was slower while trying to do naming tasks and patient remarked that this was challenging for him.   Patient doing better with requiring fewer verbal cues and able to rely more on modeling cues for technique with exercises. Patient did well with bead sorting activity and was able to pick out a specific bead and transfer to the peg board this date. Patient mildly imbalanced with some of the standing exercises but able to  self-correct loses of balance the majority of the time. Patient would benefit from continued PT services to further drive larger movement patterns and to reinforce and refine LSVT BIG exercises.    PT Education - 01/17/19 1542    Education Details  talked about the frequency he should be performing his home exercise program; reinforced LSVT BIG exercises    Person(s) Educated  Patient    Methods  Explanation;Verbal cues;Demonstration    Comprehension  Verbalized understanding;Returned demonstration       PT Short Term Goals - 01/10/19 1401      PT SHORT TERM GOAL #1   Title  Patient will be able to perform home program independently for self-management.    Time  4    Period  Weeks    Status  New    Target Date  02/05/19        PT Long Term Goals - 01/10/19 1402      PT LONG TERM GOAL #1   Title  Patient will reduce falls risk as indicated by Merrilee Jansky Balance Scale Score of > 50/56 to indicate decreased falls risk.    Baseline  scored 48/56 on 01/08/19    Time  8    Period  Weeks    Status  New    Target Date  02/21/19      PT LONG TERM GOAL #2   Title  Patient will complete five times sit to stand test in 13 seconds or less indicating an increased LE strength and improved balance.    Baseline  scored 16 seconds without use of UEs on 01/08/2019    Time  8    Period  Weeks    Status  New    Target Date  02/21/19      PT LONG TERM GOAL #3   Title  Patient will report 50% improvements in his finger dexterity as evidenced by improved hand writing ability and improved ability to work on his model trains    Time  [place]    Target Date  02/21/19            Plan - 01/18/19 1140    Clinical Impression Statement  Patient doing better with requiring fewer verbal cues and able to rely more on modeling cues for technique with exercises. Patient did well  with bead sorting activity and was able to pick out a specific bead and transfer to the peg board this  date. Patient mildly imbalanced with some of the standing exercises but able to self-correct loses of balance the majority of the time. Patient would benefit from continued PT services to further drive larger movement patterns and to reinforce and refine LSVT BIG exercises.    Personal Factors and Comorbidities  Comorbidity 3+;Age    Comorbidities  HTN, DM, OA, glaucoma, Parkinson's Disease, patient reports significan visual impairments due to macular degeneration    Examination-Participation Restrictions  Other   Hobby train building   Stability/Clinical Decision Making  Evolving/Moderate complexity    Rehab Potential  Good    PT Frequency  4x / week    PT Duration  4 weeks    PT Treatment/Interventions  ADLs/Self Care Home Management;Stair training;Gait training;Neuromuscular re-education;Therapeutic activities;Therapeutic exercise;Balance training;Patient/family education    Consulted and Agree with Plan of Care  Patient       Patient will benefit from skilled therapeutic intervention in order to improve the following deficits and impairments:  Decreased balance, Decreased endurance, Decreased strength, Decreased mobility, Difficulty walking, Impaired vision/preception  Visit Diagnosis: Difficulty in walking, not elsewhere classified  Muscle weakness (generalized)     Problem List Patient Active Problem List   Diagnosis Date Noted  . Loss of weight 04/20/2013  . Greater trochanteric bursitis of left hip 03/07/2013  . BPH (benign prostatic hypertrophy)   . HTN (hypertension)   . Diabetes type 2, uncontrolled ([facility])   . Osteoarthritis   . Glaucoma   . HLD (hyperlipidemia)    Mardelle Matteorriea Deasha Clendenin PT, DPT [phone] Mardelle MatteMurphy,Nattie Lazenby 01/18/2019, 11:40 AM  [facility] [address] [place], [place], [address] Phone: [phone]   Fax:  [phone]  Name: Bradley [name] MRN: [id number] Date of Birth: December 20, 1935

## 2019-01-18 ENCOUNTER — Ambulatory Visit: Payer: Medicare Other | Admitting: Physical Therapy

## 2019-01-18 ENCOUNTER — Encounter: Payer: Self-pay | Admitting: Physical Therapy

## 2019-01-18 ENCOUNTER — Encounter: Payer: Self-pay | Admitting: Speech Pathology

## 2019-01-18 ENCOUNTER — Ambulatory Visit: Payer: Medicare Other | Admitting: Speech Pathology

## 2019-01-18 ENCOUNTER — Other Ambulatory Visit: Payer: Self-pay

## 2019-01-18 DIAGNOSIS — R262 Difficulty in walking, not elsewhere classified: Secondary | ICD-10-CM

## 2019-01-18 DIAGNOSIS — M6281 Muscle weakness (generalized): Secondary | ICD-10-CM

## 2019-01-18 DIAGNOSIS — R490 Dysphonia: Secondary | ICD-10-CM

## 2019-01-18 NOTE — Therapy (Signed)
[place] [facility] MAIN [facility] [address] [place], [place], [address] Phone: [phone]   Fax:  [phone]  Physical Therapy Treatment  Patient Details  Name: Bradley GullyRonald [name] MRN: [id number] Date of Birth: 04/29/1936 Referring Provider (PT): Dr. Sherryll BurgerShah   Encounter Date: 01/18/2019  PT End of Session - 01/18/19 1357    Visit Number  5    Number of Visits  17    Date for PT Re-Evaluation  02/21/19    PT Start Time  1354    Equipment Utilized During Treatment  Gait belt    Activity Tolerance  Patient tolerated treatment well    Behavior During Therapy  [facility] for tasks assessed/performed       Past Medical History:  Diagnosis Date  . Anemia   . BPH (benign prostatic hypertrophy)   . Diabetes type 2, controlled (HCC)   . GERD (gastroesophageal reflux disease)    RARE  . Glaucoma    Syndor  . Hearing loss    bilateral hearing aids occasionally  . History of kidney stones   . HLD (hyperlipidemia)   . HTN (hypertension)   . Macular degeneration    Right (Appenseler)  . Osteoarthritis    thumbs, hips, knees (s/p R TKR)  . PUD (peptic ulcer disease) 03/2013   2 antral gastric ulcers and 1 duodenal ulcer, NSAID related, [facility] with melena/anemia s/p EGD  . Sleep apnea    CPAP  . Tremor    intention    Past Surgical History:  Procedure Laterality Date  . BELPHAROPTOSIS REPAIR  2012  . carotid US  12/2011   no significant stenosis  . CATARACT EXTRACTION Bilateral 2011, 2012  . CHOLECYSTECTOMY  1997  . COLONOSCOPY  2009   per patient, told no longer needed  . CYSTOSCOPY W/ URETERAL STENT PLACEMENT Left 11/01/2016   Procedure: CYSTOSCOPY WITH STENT REPLACEMENT;  Surgeon: Vanna ScotlandBrandon, Ashley, MD;  Location: [facility] ORS;  Service: Urology;  Laterality: Left;  . CYSTOSCOPY WITH STENT PLACEMENT Left 10/26/2016   Procedure: CYSTOSCOPY WITH STENT PLACEMENT;  Surgeon: Vanna ScotlandBrandon, Ashley, MD;  Location: [facility] ORS;  Service: Urology;   Laterality: Left;  . ESOPHAGOGASTRODUODENOSCOPY  04/2013   hospitalization @ [facility], gastritis, gastric ulcers and duodenal ulcer with clean base (Rein)  . HOLEP-LASER ENUCLEATION OF THE PROSTATE WITH MORCELLATION N/A 11/01/2016   Procedure: HOLEP-LASER ENUCLEATION OF THE PROSTATE WITH MORCELLATION;  Surgeon: Vanna ScotlandBrandon, Ashley, MD;  Location: [facility] ORS;  Service: Urology;  Laterality: N/A;  . KIDNEY STONE SURGERY  83, 87, 90  . TOTAL KNEE ARTHROPLASTY Right 2006  . URETEROSCOPY Left 10/26/2016   Procedure: URETEROSCOPY;  Surgeon: Vanna ScotlandBrandon, Ashley, MD;  Location: [facility] ORS;  Service: Urology;  Laterality: Left;  . URETEROSCOPY WITH HOLMIUM LASER LITHOTRIPSY Left 11/01/2016   Procedure: URETEROSCOPY WITH HOLMIUM LASER LITHOTRIPSY;  Surgeon: Vanna ScotlandBrandon, Ashley, MD;  Location: [facility] ORS;  Service: Urology;  Laterality: Left;  . US ECHOCARDIOGRAPHY  12/2011   nl LV fxn, EF 60%, nl valves, dilated aortic root (40mm)    There were no vitals filed for this visit.  Subjective Assessment - 01/18/19 1356    Subjective  Patient states that    Pertinent History  Patient reports he was diagnosed with Parkinson's Disease December 23, 2016. Patient reports he has not noticed any weakness on one side of his body compared to the other. Since the COVID epidemic, patient reports that he has become more physically active and has lost weight. Patient is gradually  built up to now being able to walk 1 mile a day. Patient reports he was walking with a cane prior to COVID and now is ambulating without assistive device. Patient states he feels good. Patient states he lives in a retirement community, [facility], and was using the gym at first until COVID. Then, he started walking 3-4 days a week in April and towards the end of the month now he walks daily. Patient is on weight watchers program and has lost about 20 pounds. Patient reports that before he lost weight and got in better shape his Parkinson's medication was not working as well and  he was told that they might have to increase his dosage, but patient states he saw his neurologist recently and they did not need to adjust his medications. Patient usually uses [facility] for community mobility at times. Patient reports he has the most difficulty with dexterity in his fingertips and with stamina/endurance. 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 Patient wears glasses. Patient reports he only has 50-60% vision due to macular degeneration. 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 Patient gets tremors in the evening and when fatigued left hand tremors more than right.  Patient reports his hand writing has declined since his diagnosis. Patient reports that the medication helps his symptoms and that fatigue worsens his symptoms of Parkinson's. Patient reports that he has difficult with bending and walking. Patient would like to work on improving his balance, increase his walking endurance, be better able to work on model trains and with his speech. Patient is also enrolled in the LSVT LOUD program.       LSVT: Patient seen for LSVT Daily Session Maximal Daily Exercises for facilitation/coordination of movement Maximum Sustained Movements are designed to rescale the amplitude of movement output for generalization to daily functional activities. Performed as follows for 1 set of 10 repetitions each multi-directional sustained movements: 1) Floor to ceiling -performed without cuing with good technique 2) Side to side multidirectional -cue for extending leg  Repetitive movements performed in standing and are designed to provide retraining effort needed for sustained muscle activation in tasks. Performed as follows for 1 set of 10 repetitions each of multi-directional repetitive movements: 3) Step and reach forward step-patient able to perform without modeling or cuing with good  weight-shifting. Patient did all of the counting for this exercise. 4) Step and reach sideways step 5) Step and reach backwards step-cuing and modeling required for technique. Patient initially trying to step forward instead of backwards.  6) Rock and reach forward/backward- cuing for shifting weight between forward and backward stance leg. 7) Rock and reach sideways- required modeling and cuing for foot placement and to raise both arms not just the lead arm  Functional Component Task: 1.Sit to stand BIG functional component task with supervision 5 reps  2. Practiced BIG handwriting in cursive font of a different word today-Model Trains 5 reps 3. Practiced small hollow beads picking up one at a time as called out by therapist and placing them on the small green, plastic board 4. Practiced stepping onto, walking across and then stepping off red foam mat 5 reps with SBA. 5. Practiced on Airex pad, semi-tandem stance with 5 reps of body turns with left leg forward and then repeated with right leg forward  Patient given daily carryover task to complete. Patient to practice  doing putting on taking off toothpaste cap using BIG techniques.  BIG ambulation:  Patient ambulated 1000" with BIG movements with increased step length and arm swing while doing alternating cone tapping at chest levelinitially but as patient began to fatigue began to drop down to waist level.     PT Short Term Goals - 01/10/19 1401      PT SHORT TERM GOAL #1   Title  Patient will be able to perform home program independently for self-management.    Time  4    Period  Weeks    Status  New    Target Date  02/05/19        PT Long Term Goals - 01/10/19 1402      PT LONG TERM GOAL #1   Title  Patient will reduce falls risk as indicated by Merrilee Jansky Balance Scale Score of > 50/56 to indicate decreased falls risk.    Baseline  scored 48/56 on 01/08/19    Time  8    Period  Weeks    Status  New    Target Date  02/21/19       PT LONG TERM GOAL #2   Title  Patient will complete five times sit to stand test in 13 seconds or less indicating an increased LE strength and improved balance.    Baseline  scored 16 seconds without use of UEs on 01/08/2019    Time  8    Period  Weeks    Status  New    Target Date  02/21/19      PT LONG TERM GOAL #3   Title  Patient will report 50% improvements in his finger dexterity as evidenced by improved hand writing ability and improved ability to work on his model trains    Time  [place]    Target Date  02/21/19            Plan - 01/18/19 1357    Clinical Impression Statement  Worked on trying to have patient look at his home exercise program and have patient demonstrate what he though each exercise looks like and then provided cuing via verbal or modeling cues as needed in order to help patient become more independent with being able to perform the LSVT BIG home exercises Friday, Saturday and Sunday which are his off days from PT. Patient continues to improve requiring decrease cuing each visit. Patient struggles the most with the step and reach backward and rock and reach sideways exercises. Patient is able to do step and reach forward without cuing this date. In addition, patient performed counting for 40-50% of the exercises this date. Encouraged patient to perform the LSVT BIG homework daily and to follow-up as indicated.    Personal Factors and Comorbidities  Comorbidity 3+;Age    Comorbidities  HTN, DM, OA, glaucoma, Parkinson's Disease, patient reports significan visual impairments due to macular degeneration    Examination-Participation Restrictions  Other   Hobby train building   Stability/Clinical Decision Making  Evolving/Moderate complexity    Rehab Potential  Good    PT Frequency  4x / week    PT Duration  4 weeks    PT Treatment/Interventions  ADLs/Self Care Home Management;Stair training;Gait training;Neuromuscular  re-education;Therapeutic activities;Therapeutic exercise;Balance training;Patient/family education    Consulted and Agree with Plan of Care  Patient       Patient will benefit from skilled therapeutic intervention in order to  improve the following deficits and impairments:  Decreased balance, Decreased endurance, Decreased strength, Decreased mobility, Difficulty walking, Impaired vision/preception  Visit Diagnosis: No diagnosis found.     Problem List Patient Active Problem List   Diagnosis Date Noted  . Loss of weight 04/20/2013  . Greater trochanteric bursitis of left hip 03/07/2013  . BPH (benign prostatic hypertrophy)   . HTN (hypertension)   . Diabetes type 2, uncontrolled (HCC)   . Osteoarthritis   . Glaucoma   . HLD (hyperlipidemia)    Mardelle Matte PT, DPT [phone] Mardelle Matte 01/18/2019, 1:57 PM  [place] [facility] MAIN [facility] SERVICES [address] [place], [place], [address] Phone: [phone]   Fax:  [phone]  Name: Lillard Vanderhoek MRN: [id number] Date of Birth: 04/22/1936

## 2019-01-18 NOTE — Therapy (Signed)
[place] [facility] MAIN [facility] SERVICES [address] [place], [place], [address] Phone: [phone]   Fax:  [phone]  Speech Language Pathology Treatment  Patient Details  Name: Bradley [name] MRN: [id number] Date of Birth: 03/06/1936 Referring Provider (SLP): Dr. Sherryll Burger   Encounter Date: 01/18/2019  End of Session - 01/18/19 1643    Visit Number  5    Number of Visits  17    Date for SLP Re-Evaluation  02/12/19    Authorization Type  Medicare    Authorization Time Period  Start 01/08/2019    Authorization - Visit Number  5    Authorization - Number of Visits  10    SLP Start Time  1500    SLP Stop Time   1550    SLP Time Calculation (min)  50 min    Activity Tolerance  Patient tolerated treatment well       Past Medical History:  Diagnosis Date  . Anemia   . BPH (benign prostatic hypertrophy)   . Diabetes type 2, controlled (HCC)   . GERD (gastroesophageal reflux disease)    RARE  . Glaucoma    Syndor  . Hearing loss    bilateral hearing aids occasionally  . History of kidney stones   . HLD (hyperlipidemia)   . HTN (hypertension)   . Macular degeneration    Right (Appenseler)  . Osteoarthritis    thumbs, hips, knees (s/p R TKR)  . PUD (peptic ulcer disease) 03/2013   2 antral gastric ulcers and 1 duodenal ulcer, NSAID related, admitted to [facility] with melena/anemia s/p EGD  . Sleep apnea    CPAP  . Tremor    intention    Past Surgical History:  Procedure Laterality Date  . BELPHAROPTOSIS REPAIR  2012  . carotid US  12/2011   no significant stenosis  . CATARACT EXTRACTION Bilateral 2011, 2012  . CHOLECYSTECTOMY  1997  . COLONOSCOPY  2009   per patient, told no longer needed  . CYSTOSCOPY W/ URETERAL STENT PLACEMENT Left 11/01/2016   Procedure: CYSTOSCOPY WITH STENT REPLACEMENT;  Surgeon: [name], MD;  Location: [facility] ORS;  Service: Urology;  Laterality: Left;  . CYSTOSCOPY WITH STENT PLACEMENT Left 10/26/2016   Procedure: CYSTOSCOPY WITH STENT PLACEMENT;  Surgeon: [name], MD;  Location: [facility] ORS;  Service: Urology;  Laterality: Left;  . ESOPHAGOGASTRODUODENOSCOPY  04/2013   hospitalization @ [facility], gastritis, gastric ulcers and duodenal ulcer with clean base (Rein)  . HOLEP-LASER ENUCLEATION OF THE PROSTATE WITH MORCELLATION N/A 11/01/2016   Procedure: HOLEP-LASER ENUCLEATION OF THE PROSTATE WITH MORCELLATION;  Surgeon: [name], MD;  Location: [facility] ORS;  Service: Urology;  Laterality: N/A;  . KIDNEY STONE SURGERY  83, 87, 90  . TOTAL KNEE ARTHROPLASTY Right 2006  . URETEROSCOPY Left 10/26/2016   Procedure: URETEROSCOPY;  Surgeon: [name], MD;  Location: [facility] ORS;  Service: Urology;  Laterality: Left;  . URETEROSCOPY WITH HOLMIUM LASER LITHOTRIPSY Left 11/01/2016   Procedure: URETEROSCOPY WITH HOLMIUM LASER LITHOTRIPSY;  Surgeon: [name], MD;  Location: [facility] ORS;  Service: Urology;  Laterality: Left;  . US ECHOCARDIOGRAPHY  12/2011   nl LV fxn, EF 60%, nl valves, dilated aortic root (19mm)    There were no vitals filed for this visit.  Subjective Assessment - 01/18/19 1642    Subjective  "Scaring the dog" while doing his vocal exercises            ADULT SLP TREATMENT - 01/18/19 0001  General Information   Behavior/Cognition  Alert;Cooperative;Pleasant mood    HPI   83 year old man with Parkinson's disease       Treatment Provided   Treatment provided  Cognitive-Linquistic      Pain Assessment   Pain Assessment  No/denies pain      Cognitive-Linquistic Treatment   Treatment focused on  Voice    Skilled Treatment  Daily Task #1 (Maximum sustained "ah"): Average 13 seconds, 80 dB. Daily Task 2 (Maximum fundamental frequency range): Highs: 15 high pitched "ah" given max cues (260 Hz). Lows: 15 low pitched "ah" given max cues (100 Hz). Daily task #3 (Maximum speech loudness drill of functional phrases): Average 72 dB.  Hierarchal speech loudness drill:   Conversation, 68 dB.  Homework: assignments completed.  Off the cuff remarks: average 69 dB.        Assessment / Recommendations / Plan   Plan  Continue with current plan of care      Progression Toward Goals   Progression toward goals  Progressing toward goals       SLP Education - 01/18/19 1643    Education Details  LSVT-LOUD    Person(s) Educated  Patient    Methods  Explanation    Comprehension  Verbalized understanding         SLP Long Term Goals - 01/08/19 1329      SLP LONG TERM GOAL #1   Title  The patient will complete Daily Tasks (Maximum duration "ah", High/Lows, and Functional Phrases) at average loudness of 80 dB and with loud, good quality voice.    Time  4    Period  Weeks    Status  New    Target Date  02/12/19      SLP LONG TERM GOAL #2   Title  The patient will complete Hierarchal Speech Loudness reading drills (words/phrases, sentences, and paragraph) at average 75 dB and with loud, good quality voice.    Time  4    Period  Weeks    Status  New    Target Date  02/12/19      SLP LONG TERM GOAL #3   Title  The patient will participate in conversation, maintaining average loudness of 75 dB and loud, good quality voice.    Time  4    Period  Weeks    Status  New    Target Date  02/12/19      SLP LONG TERM GOAL #4   Title  The patient will complete homework daily.    Time  4    Period  Weeks    Status  New    Target Date  02/12/19       Plan - 01/18/19 1643    Clinical Impression Statement  The patient is completing daily tasks and hierarchal speech drill tasks with louder, better quality voice given frequent cues.  He continues to be resistant to being louder in conversation.    Speech Therapy Frequency  4x / week    Duration  4 weeks    Treatment/Interventions  SLP instruction and feedback;Patient/family education;Other (comment)    Potential to Achieve Goals  Good    Potential Considerations  Ability to learn/carryover information;Previous  level of function;Co-morbidities;Severity of impairments;Cooperation/participation level;Medical prognosis;Family/community support    SLP Home Exercise Plan  LSVT-LOUD daily homework    Consulted and Agree with Plan of Care  Patient       Patient will benefit from skilled therapeutic intervention in order  to improve the following deficits and impairments:   Dysphonia    Problem List Patient Active Problem List   Diagnosis Date Noted  . Loss of weight 04/20/2013  . Greater trochanteric bursitis of left hip 03/07/2013  . BPH (benign prostatic hypertrophy)   . HTN (hypertension)   . Diabetes type 2, uncontrolled (HCC)   . Osteoarthritis   . Glaucoma   . HLD (hyperlipidemia)    Dollene PrimroseSusan G Okechukwu Regnier, MS/CCC- SLP  Leandrew KoyanagiAbernathy, Susie 01/18/2019, 4:44 PM  [place] [facility] [address] [place], [place], [address] Phone: [phone]   Fax:  [phone]   Name: Bradley [name] MRN: [id number] Date of Birth: 01-18-1936

## 2019-01-22 ENCOUNTER — Encounter: Payer: Self-pay | Admitting: Physical Therapy

## 2019-01-22 ENCOUNTER — Ambulatory Visit: Payer: Medicare Other | Admitting: Physical Therapy

## 2019-01-22 ENCOUNTER — Encounter: Payer: Self-pay | Admitting: Speech Pathology

## 2019-01-22 ENCOUNTER — Ambulatory Visit: Payer: Medicare Other | Admitting: Speech Pathology

## 2019-01-22 ENCOUNTER — Other Ambulatory Visit: Payer: Self-pay

## 2019-01-22 DIAGNOSIS — R262 Difficulty in walking, not elsewhere classified: Secondary | ICD-10-CM

## 2019-01-22 DIAGNOSIS — M6281 Muscle weakness (generalized): Secondary | ICD-10-CM

## 2019-01-22 DIAGNOSIS — R490 Dysphonia: Secondary | ICD-10-CM | POA: Diagnosis not present

## 2019-01-22 NOTE — Therapy (Signed)
[place] MAIN [facility] SERVICES [address] [place], [place], [address] Phone: [phone]   Fax:  [phone]  Speech Language Pathology Treatment  Patient Details  Name: Bradley [name] MRN: [id number] Date of Birth: 1935-06-14 Referring Provider (SLP): Dr. Manuella Ghazi   Encounter Date: 01/22/2019  End of Session - 01/22/19 1603    Visit Number  6    Number of Visits  17    Date for SLP Re-Evaluation  02/12/19    Authorization Type  Medicare    Authorization Time Period  Start 01/08/2019    Authorization - Visit Number  6    Authorization - Number of Visits  10    SLP Start Time  1500    SLP Stop Time   1550    SLP Time Calculation (min)  50 min    Activity Tolerance  Patient tolerated treatment well       Past Medical History:  Diagnosis Date  . Anemia   . BPH (benign prostatic hypertrophy)   . Diabetes type 2, controlled ([place])   . GERD (gastroesophageal reflux disease)    RARE  . Glaucoma    Syndor  . Hearing loss    bilateral hearing aids occasionally  . History of kidney stones   . HLD (hyperlipidemia)   . HTN (hypertension)   . Macular degeneration    Right ([place])  . Osteoarthritis    thumbs, hips, knees (s/p R TKR)  . PUD (peptic ulcer disease) 03/2013   2 antral gastric ulcers and 1 duodenal ulcer, NSAID related, admitted to [facility] with melena/anemia s/p EGD  . Sleep apnea    CPAP  . Tremor    intention    Past Surgical History:  Procedure Laterality Date  . [place]  2012  . carotid US  12/2011   no significant stenosis  . CATARACT EXTRACTION Bilateral 2011, 2012  . CHOLECYSTECTOMY  1997  . COLONOSCOPY  2009   per patient, told no longer needed  . CYSTOSCOPY W/ URETERAL STENT PLACEMENT Left 11/01/2016   Procedure: CYSTOSCOPY WITH STENT REPLACEMENT;  Surgeon: Hollice Espy, MD;  Location: [facility] OR[facility];  Service: Urology;  Laterality: Left;  . CYSTOSCOPY WITH STENT PLACEMENT Left 10/26/2016   Procedure: CYSTOSCOPY WITH STENT PLACEMENT;  Surgeon: Hollice Espy, MD;  Location: [facility];  Service: Urology;  Laterality: Left;  . ESOPHAGOGASTRODUODENOSCOPY  04/2013   hospitalization @ [place], gastritis, gastric ulcers and duodenal ulcer with clean base ([place])  . HOLEP-LASER ENUCLEATION OF THE PROSTATE WITH MORCELLATION N/A 11/01/2016   Procedure: HOLEP-LASER ENUCLEATION OF THE PROSTATE WITH MORCELLATION;  Surgeon: Hollice Espy, MD;  Location: [facility];  Service: Urology;  Laterality: N/A;  . KIDNEY STONE SURGERY  83, 87, 90  . TOTAL KNEE ARTHROPLASTY Right 2006  . URETEROSCOPY Left 10/26/2016   Procedure: URETEROSCOPY;  Surgeon: Hollice Espy, MD;  Location: [facility];  Service: Urology;  Laterality: Left;  . URETEROSCOPY WITH HOLMIUM LASER LITHOTRIPSY Left 11/01/2016   Procedure: URETEROSCOPY WITH HOLMIUM LASER LITHOTRIPSY;  Surgeon: Hollice Espy, MD;  Location: [facility];  Service: Urology;  Laterality: Left;  . US ECHOCARDIOGRAPHY  12/2011   nl LV fxn, EF 60%, nl valves, dilated aortic root (73mm)    There were no vitals filed for this visit.  Subjective Assessment - 01/22/19 1602    Subjective  Patient reports he is tired after a rough weekend, not feeling well            ADULT SLP TREATMENT -  01/22/19 0001      General Information   Behavior/Cognition  Alert;Cooperative;Pleasant mood    HPI   83 year old man with Parkinson's disease       Treatment Provided   Treatment provided  Cognitive-Linquistic      Pain Assessment   Pain Assessment  No/denies pain      Cognitive-Linquistic Treatment   Treatment focused on  Voice    Skilled Treatment  Daily Task #1 (Maximum sustained "ah"): Average 8 seconds, 75 dB. Daily Task 2 (Maximum fundamental frequency range): Highs: 15 high pitched "ah" given max cues (260 Hz). Lows: 15 low pitched "ah" given max cues (125 Hz). Daily task #3 (Maximum speech loudness drill of functional phrases): Average 72 dB.  Hierarchal speech  loudness drill:  Conversation, 68 dB.  Homework: assignments completed.  Off the cuff remarks: average 67 dB.        Assessment / Recommendations / Plan   Plan  Continue with current plan of care      Progression Toward Goals   Progression toward goals  Progressing toward goals       SLP Education - 01/22/19 1602    Education Details  LSVT-LOUD    Person(s) Educated  Patient    Methods  Explanation    Comprehension  Verbalized understanding         SLP Long Term Goals - 01/08/19 1329      SLP LONG TERM GOAL #1   Title  The patient will complete Daily Tasks (Maximum duration "ah", High/Lows, and Functional Phrases) at average loudness of 80 dB and with loud, good quality voice.    Time  4    Period  Weeks    Status  New    Target Date  02/12/19      SLP LONG TERM GOAL #2   Title  The patient will complete Hierarchal Speech Loudness reading drills (words/phrases, sentences, and paragraph) at average 75 dB and with loud, good quality voice.    Time  4    Period  Weeks    Status  New    Target Date  02/12/19      SLP LONG TERM GOAL #3   Title  The patient will participate in conversation, maintaining average loudness of 75 dB and loud, good quality voice.    Time  4    Period  Weeks    Status  New    Target Date  02/12/19      SLP LONG TERM GOAL #4   Title  The patient will complete homework daily.    Time  4    Period  Weeks    Status  New    Target Date  02/12/19       Plan - 01/22/19 1604    Clinical Impression Statement  The patient reports that he "had a rough weekend" however, he continues to complete daily tasks and hierarchal speech drill tasks with louder, better quality voice given frequent cues.    Speech Therapy Frequency  4x / week    Duration  4 weeks    Treatment/Interventions  SLP instruction and feedback;Patient/family education;Other (comment)    Potential to Achieve Goals  Good    Potential Considerations  Ability to learn/carryover  information;Previous level of function;Co-morbidities;Severity of impairments;Cooperation/participation level;Medical prognosis;Family/community support    SLP Home Exercise Plan  LSVT-LOUD daily homework    Consulted and Agree with Plan of Care  Patient       Patient will benefit  from skilled therapeutic intervention in order to improve the following deficits and impairments:   Dysphonia    Problem List Patient Active Problem List   Diagnosis Date Noted  . Loss of weight 04/20/2013  . Greater trochanteric bursitis of left hip 03/07/2013  . BPH (benign prostatic hypertrophy)   . HTN (hypertension)   . Diabetes type 2, uncontrolled (H[facility])   . Osteoarthritis   . Glaucoma   . HLD (hyperlipidemia)    Dollene PrimroseSusan G Nolah Krenzer, MS/CCC- SLP  Leandrew KoyanagiAbernathy, Susie 01/22/2019, 4:04 PM  [facility] [address] [place], [place], [address] Phone: [phone]   Fax:  [phone]   Name: Christinia GullyRonald Bigley MRN: [id number] Date of Birth: [birth year]

## 2019-01-22 NOTE — Therapy (Signed)
[place] MAIN [facility] SERVICES [address] [place], [place], [address] Phone: [phone]   Fax:  [phone]  Physical Therapy Treatment  Patient Details  Name: Bradley [name] MRN: [id number] Date of Birth: March 14, 1936 Referring Provider (PT): Dr. Manuella Ghazi   Encounter Date: 01/22/2019  PT End of Session - 01/22/19 1358    Visit Number  6    Number of Visits  17    Date for PT Re-Evaluation  02/21/19    PT Start Time  1359    PT Stop Time  1459    PT Time Calculation (min)  60 min    Equipment Utilized During Treatment  Gait belt    Activity Tolerance  Patient tolerated treatment well    Behavior During Therapy  [facility] for tasks assessed/performed       Past Medical History:  Diagnosis Date  . Anemia   . BPH (benign prostatic hypertrophy)   . Diabetes type 2, controlled ([place])   . GERD (gastroesophageal reflux disease)    RARE  . Glaucoma    Syndor  . Hearing loss    bilateral hearing aids occasionally  . History of kidney stones   . HLD (hyperlipidemia)   . HTN (hypertension)   . Macular degeneration    Right ([place])  . Osteoarthritis    thumbs, hips, knees (s/p R TKR)  . PUD (peptic ulcer disease) 03/2013   2 antral gastric ulcers and 1 duodenal ulcer, NSAID related, admitted to [facility] with melena/anemia s/p EGD  . Sleep apnea    CPAP  . Tremor    intention    Past Surgical History:  Procedure Laterality Date  . [place]  2012  . carotid US  12/2011   no significant stenosis  . CATARACT EXTRACTION Bilateral 2011, 2012  . CHOLECYSTECTOMY  1997  . COLONOSCOPY  2009   per patient, told no longer needed  . CYSTOSCOPY W/ URETERAL STENT PLACEMENT Left 11/01/2016   Procedure: CYSTOSCOPY WITH STENT REPLACEMENT;  Surgeon: Hollice Espy, MD;  Location: [facility];  Service: Urology;  Laterality: Left;  . CYSTOSCOPY WITH STENT PLACEMENT Left 10/26/2016   Procedure: CYSTOSCOPY WITH STENT PLACEMENT;  Surgeon:  Hollice Espy, MD;  Location: [facility];  Service: Urology;  Laterality: Left;  . ESOPHAGOGASTRODUODENOSCOPY  04/2013   hospitalization @ [place], gastritis, gastric ulcers and duodenal ulcer with clean base ([place])  . HOLEP-LASER ENUCLEATION OF THE PROSTATE WITH MORCELLATION N/A 11/01/2016   Procedure: HOLEP-LASER ENUCLEATION OF THE PROSTATE WITH MORCELLATION;  Surgeon: Hollice Espy, MD;  Location: [facility];  Service: Urology;  Laterality: N/A;  . KIDNEY STONE SURGERY  83, 87, 90  . TOTAL KNEE ARTHROPLASTY Right 2006  . URETEROSCOPY Left 10/26/2016   Procedure: URETEROSCOPY;  Surgeon: Hollice Espy, MD;  Location: [facility];  Service: Urology;  Laterality: Left;  . URETEROSCOPY WITH HOLMIUM LASER LITHOTRIPSY Left 11/01/2016   Procedure: URETEROSCOPY WITH HOLMIUM LASER LITHOTRIPSY;  Surgeon: Hollice Espy, MD;  Location: [facility];  Service: Urology;  Laterality: Left;  . US ECHOCARDIOGRAPHY  12/2011   nl LV fxn, EF 60%, nl valves, dilated aortic root (16mm)    There were no vitals filed for this visit.  Subjective Assessment - 01/22/19 1401    Subjective  Patient states he got a flu shot on Friday and that he was sick on Saturday with diarrhea and arm pain from getting a flu shot. Patient states he did walking in the am and his LSVT exercises in the  pm on Sunday and that he tried to do some of the exercises on Saturday. Patient reports he was only able to do about 5 reps of each both days secondary to not feeling well. Patient states that he still does not feel 100% today.    Pertinent History  Patient reports he was diagnosed with Parkinson's Disease December 23, 2016. Patient reports he has not noticed any weakness on one side of his body compared to the other. Since the COVID epidemic, patient reports that he has become more physically active and has lost weight. Patient is gradually built up to now being able to walk 1 mile a day. Patient reports he was walking with a cane prior to COVID and now is  ambulating without assistive device. Patient states he feels good. Patient states he lives in a retirement community, [facility], and was using the gym at first until COVID. Then, he started walking 3-4 days a week in April and towards the end of the month now he walks daily. Patient is on weight watchers program and has lost about 20 pounds. Patient reports that before he lost weight and got in better shape his Parkinson's medication was not working as well and he was told that they might have to increase his dosage, but patient states he saw his neurologist recently and they did not need to adjust his medications. Patient usually uses [facility] for community mobility at times. Patient reports he has the most difficulty with dexterity in his fingertips and with stamina/endurance. 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 Patient wears glasses. Patient reports he only has 50-60% vision due to macular degeneration. 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 Patient gets tremors in the evening and when fatigued left hand tremors more than right.  Patient reports his hand writing has declined since his diagnosis. Patient reports that the medication helps his symptoms and that fatigue worsens his symptoms of Parkinson's. Patient reports that he has difficult with bending and walking. Patient would like to work on improving his balance, increase his walking endurance, be better able to work on model trains and with his speech. Patient is also enrolled in the LSVT LOUD program.        LSVT: Patient seen for LSVT Daily Session Maximal Daily Exercises for facilitation/coordination of movement Maximum Sustained Movements are designed to rescale the amplitude of movement output for generalization to daily functional activities. Performed as follows for 1 set of 10 repetitions each multi-directional sustained  movements: 1) Floor to ceiling  2) Side to side multidirectional - modeling cue for trunk rotation  Repetitive movements performed in standing and are designed to provide retraining effort needed for sustained muscle activation in tasks. Performed as follows for 1 set of 10 repetitions each of multi-directional repetitive movements: 3) Step and reach forward step- performed using good technique with good size step and weight shifting over stepping foot.  4) Step and reach sideways step-  Cue to take a small step and point toe towards side reaching to as at first he was trying to pivot foot and said he had difficulty doing this on the carpet at home.  5) Step and reach backwards step-needed modeling and cuing to do the forward trunk flexion and bringing arms back components of this exercise 6) Rock and reach forward/backward- patient able to look at the exercise packet  and perform this exercise; needed cuing for bigger arm flexion movement 7) Rock and reach sideways- patient was unable to look at exercise packet and figure out how to perform this exercise on his own; required modeling and cuing for technique and then was able to perform; needs cuing to pivot his feet and turn his feet towards the direction he is twisting and modeling for pt to bring the non-leading arm up into abduction.   Functional Component Task: 1. Sit to stand BIG functional component task with supervision 5 reps  2.Practiced BIG handwriting in cursive font of a different word today-wrote Merry Xmas 5 reps 3.Practiced small hollow beads picking up one at a time as called out by therapist and placing them on the small green, plastic board; patient had increased difficulty with placing beads on the board today but did well with picking up the individual beads 4.Practiced stepping onto up and over with return on Airex pad 5 reps, practiced stepping on Airex pad up onto and over return on a 6" wooden box 5 reps; then practiced  sidestepping from Airex pad onto, over and return 6" wooden box 5 reps. 5.Practiced on Airex pad, semi-tandem stance with 5 reps of body turns with left leg forward and then repeated with right leg forward  Patient given daily carryover task to complete.  BIG ambulation:  Patient ambulated 1000' with BIG movements with increased step length and arm swing while doing ball toss from hand to hand for first 500' and then did backwards counting by 4s from 100 for remaining 500'. Patient did well with maintaining BIG movements throughout the walking this date, but patient did require sitting rest break after gait secondary to fatigue.    Consider trying hand and ankle weights to exercises next week or at end of the week depending upon progress.      PT Education - 01/22/19 1609    Education Details  reviewed LSVT BIG exercises and patient required verbal and modeling cues for technique with some of the exercises    Person(s) Educated  Patient    Methods  Explanation;Demonstration;Verbal cues    Comprehension  Verbalized understanding;Returned demonstration       PT Short Term Goals - 01/10/19 1401      PT SHORT TERM GOAL #1   Title  Patient will be able to perform home program independently for self-management.    Time  4    Period  Weeks    Status  New    Target Date  02/05/19        PT Long Term Goals - 01/10/19 1402      PT LONG TERM GOAL #1   Title  Patient will reduce falls risk as indicated by Sharlene MottsBerg Balance Scale Score of > 50/56 to indicate decreased falls risk.    Baseline  scored 48/56 on 01/08/19    Time  8    Period  Weeks    Status  New    Target Date  02/21/19      PT LONG TERM GOAL #2   Title  Patient will complete five times sit to stand test in 13 seconds or less indicating an increased LE strength and improved balance.    Baseline  scored 16 seconds without use of UEs on 01/08/2019    Time  8    Period  Weeks    Status  New    Target Date  02/21/19       PT LONG TERM  GOAL #3   Title  Patient will report 50% improvements in his finger dexterity as evidenced by improved hand writing ability and improved ability to work on his model trains    Time  8    Period  Weeks    Status  New    Target Date  02/21/19            Plan - 01/22/19 1402    Clinical Impression Statement  Patient reports that he did not feel well and was not able to perform full LSVT BIG home exercise program over the weekend but that he did 5 reps of each of the exercises instead of 10. Patient needed correction on the technique for some of the exercises this date but some of the exercises he was able to do well without cuing by looking at the pictures in the LSVT BIG home exercise program packet. Patient did not have any losses of balance this date while performing the exercises which is a good improvement as compared to last week. Patient continues to work on refining the technique to be able to better perfrom the exercises independently at home. Patient would benefit from further PT services to continue to address goals and refine HEP.    Personal Factors and Comorbidities  Comorbidity 3+;Age    Comorbidities  HTN, DM, OA, glaucoma, Parkinson's Disease, patient reports significan visual impairments due to macular degeneration    Examination-Participation Restrictions  Other   Hobby train building   Stability/Clinical Decision Making  Evolving/Moderate complexity    Rehab Potential  Good    PT Frequency  4x / week    PT Duration  4 weeks    PT Treatment/Interventions  ADLs/Self Care Home Management;Stair training;Gait training;Neuromuscular re-education;Therapeutic activities;Therapeutic exercise;Balance training;Patient/family education    Consulted and Agree with Plan of Care  Patient       Patient will benefit from skilled therapeutic intervention in order to improve the following deficits and impairments:  Decreased balance, Decreased endurance, Decreased strength,  Decreased mobility, Difficulty walking, Impaired vision/preception  Visit Diagnosis: Difficulty in walking, not elsewhere classified  Muscle weakness (generalized)     Problem List Patient Active Problem List   Diagnosis Date Noted  . Loss of weight 04/20/2013  . Greater trochanteric bursitis of left hip 03/07/2013  . BPH (benign prostatic hypertrophy)   . HTN (hypertension)   . Diabetes type 2, uncontrolled ([facility])   . Osteoarthritis   . Glaucoma   . HLD (hyperlipidemia)    Mardelle Matteorriea Murphy PT, DPT [phone]#[id number] Mardelle MatteMurphy,Dorriea 01/22/2019, 4:13 PM  [place] [facility] MAIN [facility] [address] [place], [place], [address] Phone: [phone]   Fax:  [phone]  Name: Bradley [name] MRN: [id number] Date of Birth: November 20, 1935

## 2019-01-23 ENCOUNTER — Encounter: Payer: Self-pay | Admitting: Speech Pathology

## 2019-01-23 ENCOUNTER — Other Ambulatory Visit: Payer: Self-pay

## 2019-01-23 ENCOUNTER — Ambulatory Visit: Payer: Medicare Other | Attending: Neurology | Admitting: Speech Pathology

## 2019-01-23 ENCOUNTER — Encounter: Payer: Self-pay | Admitting: Physical Therapy

## 2019-01-23 ENCOUNTER — Ambulatory Visit: Payer: Medicare Other | Admitting: Physical Therapy

## 2019-01-23 DIAGNOSIS — R262 Difficulty in walking, not elsewhere classified: Secondary | ICD-10-CM | POA: Insufficient documentation

## 2019-01-23 DIAGNOSIS — M6281 Muscle weakness (generalized): Secondary | ICD-10-CM

## 2019-01-23 DIAGNOSIS — R490 Dysphonia: Secondary | ICD-10-CM | POA: Diagnosis present

## 2019-01-23 NOTE — Therapy (Signed)
[place] MAIN [facility] SERVICES [address] [place], [place], [address] Phone: [phone]   Fax:  [phone]  Speech Language Pathology Treatment  Patient Details  Name: Bradley [name] MRN: [id number] Date of Birth: [birth year] Referring Provider (SLP): Dr. Manuella Ghazi   Encounter Date: 01/23/2019  End of Session - 01/23/19 1558    Visit Number  7    Number of Visits  17    Date for SLP Re-Evaluation  02/12/19    Authorization Type  Medicare    Authorization Time Period  Start 01/08/2019    Authorization - Visit Number  7    Authorization - Number of Visits  10    SLP Start Time  1500    SLP Stop Time   1550    SLP Time Calculation (min)  50 min    Activity Tolerance  Patient tolerated treatment well       Past Medical History:  Diagnosis Date  . Anemia   . BPH (benign prostatic hypertrophy)   . Diabetes type 2, controlled ([place])   . GERD (gastroesophageal reflux disease)    RARE  . Glaucoma    Syndor  . Hearing loss    bilateral hearing aids occasionally  . History of kidney stones   . HLD (hyperlipidemia)   . HTN (hypertension)   . Macular degeneration    Right ([place])  . Osteoarthritis    thumbs, hips, knees (s/p R TKR)  . PUD (peptic ulcer disease) 03/2013   2 antral gastric ulcers and 1 duodenal ulcer, NSAID related, admitted to [facility] with melena/anemia s/p EGD  . Sleep apnea    CPAP  . Tremor    intention    Past Surgical History:  Procedure Laterality Date  . [place]  2012  . carotid US  12/2011   no significant stenosis  . CATARACT EXTRACTION Bilateral 2011, 2012  . CHOLECYSTECTOMY  1997  . COLONOSCOPY  2009   per patient, told no longer needed  . CYSTOSCOPY W/ URETERAL STENT PLACEMENT Left 11/01/2016   Procedure: CYSTOSCOPY WITH STENT REPLACEMENT;  Surgeon: Hollice Espy, MD;  Location: [facility] ORS;  Service: Urology;  Laterality: Left;  . CYSTOSCOPY WITH STENT PLACEMENT Left 10/26/2016   Procedure: CYSTOSCOPY WITH STENT PLACEMENT;  Surgeon: Hollice Espy, MD;  Location: [facility] OR[facility];  Service: Urology;  Laterality: Left;  . ESOPHAGOGASTRODUODENOSCOPY  04/2013   hospitalization @ [place], gastritis, gastric ulcers and duodenal ulcer with clean base ([place])  . HOLEP-LASER ENUCLEATION OF THE PROSTATE WITH MORCELLATION N/A 11/01/2016   Procedure: HOLEP-LASER ENUCLEATION OF THE PROSTATE WITH MORCELLATION;  Surgeon: Hollice Espy, MD;  Location: [facility] OR[facility];  Service: Urology;  Laterality: N/A;  . KIDNEY STONE SURGERY  83, 87, 90  . TOTAL KNEE ARTHROPLASTY Right 2006  . URETEROSCOPY Left 10/26/2016   Procedure: URETEROSCOPY;  Surgeon: Hollice Espy, MD;  Location: [facility] ORS;  Service: Urology;  Laterality: Left;  . URETEROSCOPY WITH HOLMIUM LASER LITHOTRIPSY Left 11/01/2016   Procedure: URETEROSCOPY WITH HOLMIUM LASER LITHOTRIPSY;  Surgeon: Hollice Espy, MD;  Location: [facility] ORS;  Service: Urology;  Laterality: Left;  . US ECHOCARDIOGRAPHY  12/2011   nl LV fxn, EF 60%, nl valves, dilated aortic root (57mm)    There were no vitals filed for this visit.  Subjective Assessment - 01/23/19 1557    Subjective  Patient reports that he feels much better            ADULT SLP TREATMENT - 01/23/19 0001  General Information   Behavior/Cognition  Alert;Cooperative;Pleasant mood    HPI   83 year old man with Parkinson's disease       Treatment Provided   Treatment provided  Cognitive-Linquistic      Pain Assessment   Pain Assessment  No/denies pain      Cognitive-Linquistic Treatment   Treatment focused on  Voice    Skilled Treatment  Daily Task #1 (Maximum sustained "ah"): Average 16 seconds, 82 dB. Daily Task 2 (Maximum fundamental frequency range): Highs: 15 high pitched "ah" given max cues (250 Hz). Lows: 15 low pitched "ah" given max cues (125 Hz). Daily task #3 (Maximum speech loudness drill of functional phrases): Average 72 dB.  Hierarchal speech loudness drill:   Conversation, 70 dB.  Homework: assignments completed.  Off the cuff remarks: average 70 dB.        Assessment / Recommendations / Plan   Plan  Continue with current plan of care      Progression Toward Goals   Progression toward goals  Progressing toward goals       SLP Education - 01/23/19 1557    Education Details  LSVT-LOUD    Person(s) Educated  Patient    Methods  Explanation    Comprehension  Verbalized understanding         SLP Long Term Goals - 01/08/19 1329      SLP LONG TERM GOAL #1   Title  The patient will complete Daily Tasks (Maximum duration "ah", High/Lows, and Functional Phrases) at average loudness of 80 dB and with loud, good quality voice.    Time  4    Period  Weeks    Status  New    Target Date  02/12/19      SLP LONG TERM GOAL #2   Title  The patient will complete Hierarchal Speech Loudness reading drills (words/phrases, sentences, and paragraph) at average 75 dB and with loud, good quality voice.    Time  4    Period  Weeks    Status  New    Target Date  02/12/19      SLP LONG TERM GOAL #3   Title  The patient will participate in conversation, maintaining average loudness of 75 dB and loud, good quality voice.    Time  4    Period  Weeks    Status  New    Target Date  02/12/19      SLP LONG TERM GOAL #4   Title  The patient will complete homework daily.    Time  4    Period  Weeks    Status  New    Target Date  02/12/19       Plan - 01/23/19 1558    Clinical Impression Statement  The patient is completing daily tasks and hierarchal speech drill tasks with louder, better quality voice given cues.  He appears to be more comfortable with loud voice.    Speech Therapy Frequency  4x / week    Duration  4 weeks    Treatment/Interventions  SLP instruction and feedback;Patient/family education;Other (comment)    Potential to Achieve Goals  Good    Potential Considerations  Ability to learn/carryover information;Previous level of  function;Co-morbidities;Severity of impairments;Cooperation/participation level;Medical prognosis;Family/community support    SLP Home Exercise Plan  LSVT-LOUD daily homework    Consulted and Agree with Plan of Care  Patient       Patient will benefit from skilled therapeutic intervention in order to improve  the following deficits and impairments:   Dysphonia    Problem List Patient Active Problem List   Diagnosis Date Noted  . Loss of weight 04/20/2013  . Greater trochanteric bursitis of left hip 03/07/2013  . BPH (benign prostatic hypertrophy)   . HTN (hypertension)   . Diabetes type 2, uncontrolled ([facility])   . Osteoarthritis   . Glaucoma   . HLD (hyperlipidemia)    Dollene PrimroseSusan G Verenice Westrich, MS/CCC- SLP  Leandrew KoyanagiAbernathy, Susie 01/23/2019, 3:59 PM  [place] [facility] [address] [place], [place], [address] Phone: [phone]   Fax:  [phone]   Name: Bradley [name] MRN: [id number] Date of Birth: [birth year]

## 2019-01-23 NOTE — Therapy (Signed)
[place] [facility] [address] [place], [place], [address] Phone: [phone]   Fax:  [phone]  Physical Therapy Treatment  Patient Details  Name: Bradley GullyRonald [name] MRN: [id number] Date of Birth: December 25, 1935 Referring Provider (PT): Dr. Sherryll BurgerShah   Encounter Date: 01/23/2019  PT End of Session - 01/23/19 1612    Visit Number  7    Number of Visits  17    Date for PT Re-Evaluation  02/21/19    PT Start Time  1352    PT Stop Time  1500    PT Time Calculation (min)  68 min    Activity Tolerance  Patient tolerated treatment well    Behavior During Therapy  [facility] for tasks assessed/performed       Past Medical History:  Diagnosis Date  . Anemia   . BPH (benign prostatic hypertrophy)   . Diabetes type 2, controlled ([facility])   . GERD (gastroesophageal reflux disease)    RARE  . Glaucoma    Syndor  . Hearing loss    bilateral hearing aids occasionally  . History of kidney stones   . HLD (hyperlipidemia)   . HTN (hypertension)   . Macular degeneration    Right ([place])  . Osteoarthritis    thumbs, hips, knees (s/p R TKR)  . PUD (peptic ulcer disease) 03/2013   2 antral gastric ulcers and 1 duodenal ulcer, NSAID related, [facility] mel[facility]/anemia s/p EGD  . Sleep apnea    CPAP  . Tremor    intention    Past Surgical History:  Procedure Laterality Date  . BELPHAROPTOSIS REPAIR  2012  . carotid US  12/2011   no significant stenosis  . CATARACT EXTRACTION Bilateral 2011, 2012  . CHOLECYSTECTOMY  1997  . COLONOSCOPY  2009   per patient, told no longer needed  . CYSTOSCOPY W/ URETERAL STENT PLACEMENT Left 11/01/2016   Procedure: CYSTOSCOPY WITH STENT REPLACEMENT;  Surgeon: Vanna ScotlandBrandon, Ashley, MD;  Location: [facility];  Service: Urology;  Laterality: Left;  . CYSTOSCOPY WITH STENT PLACEMENT Left 10/26/2016   Procedure: CYSTOSCOPY WITH STENT PLACEMENT;  Surgeon: Vanna ScotlandBrandon, Ashley, MD;  Location: [facility];  Service:  Urology;  Laterality: Left;  . ESOPHAGOGASTRODUODENOSCOPY  04/2013   hospitalization @ [facility], gastritis, gastric ulcers and duodenal ulcer with clean base ([place])  . HOLEP-LASER ENUCLEATION OF THE PROSTATE WITH MORCELLATION N/A 11/01/2016   Procedure: HOLEP-LASER ENUCLEATION OF THE PROSTATE WITH MORCELLATION;  Surgeon: Vanna ScotlandBrandon, Ashley, MD;  Location: [facility];  Service: Urology;  Laterality: N/A;  . KIDNEY STONE SURGERY  83, 87, 90  . TOTAL KNEE ARTHROPLASTY Right 2006  . URETEROSCOPY Left 10/26/2016   Procedure: URETEROSCOPY;  Surgeon: Vanna ScotlandBrandon, Ashley, MD;  Location: [facility];  Service: Urology;  Laterality: Left;  . URETEROSCOPY WITH HOLMIUM LASER LITHOTRIPSY Left 11/01/2016   Procedure: URETEROSCOPY WITH HOLMIUM LASER LITHOTRIPSY;  Surgeon: Vanna ScotlandBrandon, Ashley, MD;  Location: [facility];  Service: Urology;  Laterality: Left;  . US ECHOCARDIOGRAPHY  12/2011   nl LV fxn, EF 60%, nl valves, dilated aortic root (40mm)    There were no vitals filed for this visit.  Subjective Assessment - 01/23/19 1353    Subjective  Patient reports he did not walk this morning because of the rain. Patient states he is feeling better today.    Patient Stated Goals  to learn the BIG exercises          LSVT: Patient seen for LSVT Daily Session Maximal Daily Exercises  for facilitation/coordination of movement Maximum Sustained Movements are designed to rescale the amplitude of movement output for generalization to daily functional activities. Performed as follows for 1 set of 10 repetitions each multi-directional sustained movements: 1) Floor to ceiling cue to hold for a count of 10 seconds and then able to perform without furthercuing 2) Side to side multidirectional cue to bring arm across body and then able to perform well only one cue to splay fingers on left hand.   Repetitive movements performed in standing and are designed to provide retraining effort needed for sustained muscle activation in tasks. Performed as  follows for 1 set of 10 repetitions each of multi-directional repetitive movements: 3) Step and reach forward step-able to perform without cuing or modeling this date; taking good step and weight shift 4) Step and reach sideways step- Needed to look at the paper and need cue to turn head towards the hand that reaching. 5) Step and reach backwards step-cuing and modeling required to perform this exercise correctly. A little unsteady  6) Rock and reach forward/backward-cuing needed for arm movement and to reach up tall; one episode of unsteadiness that patient able to self-correct 7) Rock and reach sideways-cuing and modeling needed to perform this correctly.   Functional Component Task: 1. Sit to stand BIG functional component task with supervision 5 reps  2.Practiced BIG handwriting in printed fonta different word today 5 reps 3.Practiced small hollow beads picking up one at a time as called out by therapist and placing them on the small green, plastic board 4.Practicedstepping onto up and over with return on Airex pad and 6" wooden box set up side by side 5 reps; then practiced sidestepping from Airex pad onto, over and return 6" wooden box 5 reps. 5.Practicedon Airex balance pad, sidestepping left/right 5 reps  Patient given daily carryover task to complete. Homework thumb to tip of each finger and back each hand and then repeat thumb to base of each finger and back each hand.   BIG ambulation: Patient ambulated 1000' with BIG movements with increased step length and arm swing with 2# ankle weights each leg. Patient did well with maintaining BIG movements throughout the walking this date and required sitting rest break at end of ambulation.       PT Short Term Goals - 01/10/19 1401      PT SHORT TERM GOAL #1   Title  Patient will be able to perform home program independently for self-management.    Time  4    Period  Weeks    Status  New    Target Date  02/05/19         PT Long Term Goals - 01/10/19 1402      PT LONG TERM GOAL #1   Title  Patient will reduce falls risk as indicated by Sharlene Motts Balance Scale Score of > 50/56 to indicate decreased falls risk.    Baseline  scored 48/56 on 01/08/19    Time  8    Period  Weeks    Status  New    Target Date  02/21/19      PT LONG TERM GOAL #2   Title  Patient will complete five times sit to stand test in 13 seconds or less indicating an increased LE strength and improved balance.    Baseline  scored 16 seconds without use of UEs on 01/08/2019    Time  8    Period  Weeks    Status  New  Target Date  02/21/19      PT LONG TERM GOAL #3   Title  Patient will report 50% improvements in his finger dexterity as evidenced by improved hand writing ability and improved ability to work on his model trains    Time  [place]    Target Date  02/21/19            Plan - 01/23/19 1612    Clinical Impression Statement  Patient able to perform over 30% of the LSVT BIG exercises using the booklet without external cuing or modeling this date. Patient continues to need cuing for rock and reach sideways, rock and reach forward/backward and step and reach backwards and sideways exercises. Patient only had a few losses of balance this date that he was able to self-correct. Patient able to work on progressions of balance exercises and added #2 ankle weights for the LSVT BIG walking this session. Patient would benefit from continued PT services to continue to work towards independence with HEP LSVT BIG program.    Personal Factors and Comorbidities  Comorbidity 3+;Age    Comorbidities  HTN, DM, OA, glaucoma, Parkinson's Disease, patient reports significan visual impairments due to macular degeneration    Examination-Participation Restrictions  Other   Hobby train building   Stability/Clinical Decision Making  Evolving/Moderate complexity    Rehab Potential  Good    PT Frequency  4x / week    PT  Duration  4 weeks    PT Treatment/Interventions  ADLs/Self Care Home Management;Stair training;Gait training;Neuromuscular re-education;Therapeutic activities;Therapeutic exercise;Balance training;Patient/family education    Consulted and Agree with Plan of Care  Patient       Patient will benefit from skilled therapeutic intervention in order to improve the following deficits and impairments:  Decreased balance, Decreased endurance, Decreased strength, Decreased mobility, Difficulty walking, Impaired vision/preception  Visit Diagnosis: Difficulty in walking, not elsewhere classified  Muscle weakness (generalized)     Problem List Patient Active Problem List   Diagnosis Date Noted  . Loss of weight 04/20/2013  . Greater trochanteric bursitis of left hip 03/07/2013  . BPH (benign prostatic hypertrophy)   . HTN (hypertension)   . Diabetes type 2, uncontrolled ([place])   . Osteoarthritis   . Glaucoma   . HLD (hyperlipidemia)    Lady Deutscher PT, DPT [phone] Lady Deutscher 01/24/2019, 10:42 AM  [place] MAIN [facility] SERVICES [address] [place], [place], [address] Phone: [phone]   Fax:  [phone]  Name: Hansen Carino MRN: [id number] Date of Birth: [birth year]

## 2019-01-24 ENCOUNTER — Encounter: Payer: Self-pay | Admitting: Physical Therapy

## 2019-01-24 ENCOUNTER — Ambulatory Visit: Payer: Medicare Other | Admitting: Physical Therapy

## 2019-01-24 ENCOUNTER — Ambulatory Visit: Payer: Medicare Other | Admitting: Speech Pathology

## 2019-01-24 ENCOUNTER — Other Ambulatory Visit: Payer: Self-pay

## 2019-01-24 ENCOUNTER — Encounter: Payer: Self-pay | Admitting: Speech Pathology

## 2019-01-24 DIAGNOSIS — R262 Difficulty in walking, not elsewhere classified: Secondary | ICD-10-CM

## 2019-01-24 DIAGNOSIS — R490 Dysphonia: Secondary | ICD-10-CM | POA: Diagnosis not present

## 2019-01-24 DIAGNOSIS — M6281 Muscle weakness (generalized): Secondary | ICD-10-CM

## 2019-01-24 NOTE — Therapy (Signed)
[facility] [facility] MAIN [facility] SERVICES [address] [place], [place], [address] Phone: [phone]   Fax:  [phone]  Speech Language Pathology Treatment  Patient Details  Name: Bradley [name] MRN: [id number] Date of Birth: [birth year] Referring Provider (SLP): Dr. Sherryll Burger   Encounter Date: 01/24/2019  End of Session - 01/24/19 1600    Visit Number  8    Number of Visits  17    Date for SLP Re-Evaluation  02/12/19    Authorization Type  Medicare    Authorization Time Period  Start 01/08/2019    Authorization - Visit Number  8    Authorization - Number of Visits  10    SLP Start Time  1500    SLP Stop Time   1550    SLP Time Calculation (min)  50 min    Activity Tolerance  Patient tolerated treatment well       Past Medical History:  Diagnosis Date  . Anemia   . BPH (benign prostatic hypertrophy)   . Diabetes type 2, controlled ([facility])   . GERD (gastroesophageal reflux disease)    RARE  . Glaucoma    Syndor  . Hearing loss    bilateral hearing aids occasionally  . History of kidney stones   . HLD (hyperlipidemia)   . HTN (hypertension)   . Macular degeneration    Right ([place])  . Osteoarthritis    thumbs, hips, knees (s/p R TKR)  . PUD (peptic ulcer disease) 03/2013   2 antral gastric ulcers and 1 duodenal ulcer, NSAID related, admitted to [facility] with melena/anemia s/p EGD  . Sleep apnea    CPAP  . Tremor    intention    Past Surgical History:  Procedure Laterality Date  . BELPHAROPTOSIS REPAIR  2012  . carotid US  12/2011   no significant stenosis  . CATARACT EXTRACTION Bilateral 2011, 2012  . CHOLECYSTECTOMY  1997  . COLONOSCOPY  2009   per patient, told no longer needed  . CYSTOSCOPY W/ URETERAL STENT PLACEMENT Left 11/01/2016   Procedure: CYSTOSCOPY WITH STENT REPLACEMENT;  Surgeon: [name], MD;  Location: [facility];  Service: Urology;  Laterality: Left;  . CYSTOSCOPY WITH STENT PLACEMENT Left 10/26/2016   Procedure: CYSTOSCOPY WITH STENT PLACEMENT;  Surgeon: [name], MD;  Location: [facility];  Service: Urology;  Laterality: Left;  . ESOPHAGOGASTRODUODENOSCOPY  04/2013   hospitalization @ [facility], gastritis, gastric ulcers and duodenal ulcer with clean base ([place])  . HOLEP-LASER ENUCLEATION OF THE PROSTATE WITH MORCELLATION N/A 11/01/2016   Procedure: HOLEP-LASER ENUCLEATION OF THE PROSTATE WITH MORCELLATION;  Surgeon: [name], MD;  Location: [facility];  Service: Urology;  Laterality: N/A;  . KIDNEY STONE SURGERY  83, 87, 90  . TOTAL KNEE ARTHROPLASTY Right 2006  . URETEROSCOPY Left 10/26/2016   Procedure: URETEROSCOPY;  Surgeon: [name], MD;  Location: [facility];  Service: Urology;  Laterality: Left;  . URETEROSCOPY WITH HOLMIUM LASER LITHOTRIPSY Left 11/01/2016   Procedure: URETEROSCOPY WITH HOLMIUM LASER LITHOTRIPSY;  Surgeon: [name], MD;  Location: [facility];  Service: Urology;  Laterality: Left;  . US ECHOCARDIOGRAPHY  12/2011   nl LV fxn, EF 60%, nl valves, dilated aortic root (51mm)    There were no vitals filed for this visit.  Subjective Assessment - 01/24/19 1559    Subjective  Patient reports that he feels much better            ADULT SLP TREATMENT - 01/24/19 0001  General Information   Behavior/Cognition  Alert;Cooperative;Pleasant mood    HPI   83 year old man with Parkinson's disease       Treatment Provided   Treatment provided  Cognitive-Linquistic      Pain Assessment   Pain Assessment  No/denies pain      Cognitive-Linquistic Treatment   Treatment focused on  Voice    Skilled Treatment  Daily Task #1 (Maximum sustained "ah"): Average 16 seconds, 82 dB. Daily Task 2 (Maximum fundamental frequency range): Highs: 15 high pitched "ah" given max cues (230 Hz). Lows: 15 low pitched "ah" given max cues (105 Hz). Daily task #3 (Maximum speech loudness drill of functional phrases): Average 72 dB.  Hierarchal speech loudness drill:   Conversation, 70 dB.  Homework: assignments completed.  Off the cuff remarks: average 70 dB.        Assessment / Recommendations / Plan   Plan  Continue with current plan of care      Progression Toward Goals   Progression toward goals  Progressing toward goals       SLP Education - 01/24/19 1559    Education Details  LSVT-LOUD, beat your best score    Person(s) Educated  Patient    Methods  Explanation    Comprehension  Verbalized understanding         SLP Long Term Goals - 01/08/19 1329      SLP LONG TERM GOAL #1   Title  The patient will complete Daily Tasks (Maximum duration "ah", High/Lows, and Functional Phrases) at average loudness of 80 dB and with loud, good quality voice.    Time  4    Period  Weeks    Status  New    Target Date  02/12/19      SLP LONG TERM GOAL #2   Title  The patient will complete Hierarchal Speech Loudness reading drills (words/phrases, sentences, and paragraph) at average 75 dB and with loud, good quality voice.    Time  4    Period  Weeks    Status  New    Target Date  02/12/19      SLP LONG TERM GOAL #3   Title  The patient will participate in conversation, maintaining average loudness of 75 dB and loud, good quality voice.    Time  4    Period  Weeks    Status  New    Target Date  02/12/19      SLP LONG TERM GOAL #4   Title  The patient will complete homework daily.    Time  4    Period  Weeks    Status  New    Target Date  02/12/19       Plan - 01/24/19 1600    Clinical Impression Statement  The patient is completing daily tasks and hierarchal speech drill tasks with louder, better quality voice given cues.  He appears to be more comfortable with loud voice.    Speech Therapy Frequency  4x / week    Duration  4 weeks    Treatment/Interventions  SLP instruction and feedback;Patient/family education;Other (comment)    Potential to Achieve Goals  Good    Potential Considerations  Ability to learn/carryover information;Previous  level of function;Co-morbidities;Severity of impairments;Cooperation/participation level;Medical prognosis;Family/community support    SLP Home Exercise Plan  LSVT-LOUD daily homework    Consulted and Agree with Plan of Care  Patient       Patient will benefit from skilled therapeutic intervention  in order to improve the following deficits and impairments:   Dysphonia    Problem List Patient Active Problem List   Diagnosis Date Noted  . Loss of weight 04/20/2013  . Greater trochanteric bursitis of left hip 03/07/2013  . BPH (benign prostatic hypertrophy)   . HTN (hypertension)   . Diabetes type 2, uncontrolled ([place])   . Osteoarthritis   . Glaucoma   . HLD (hyperlipidemia)     Lou Miner 01/24/2019, 4:01 PM Leroy Sea, MS/CCC- [place] MAIN [facility] [address] [place], [place], [address] Phone: [phone]   Fax:  [phone]   Name: Bradley [name] MRN: [id number] Date of Birth: Aug 08, 1935

## 2019-01-24 NOTE — Therapy (Signed)
[facility] [address] [place], [place], [address] Phone: [phone]   Fax:  [phone]  Physical Therapy Treatment  Patient Details  Name: [name] MRN: [id number] Date of Birth: 09-13-1935 Referring Provider (PT): Dr. Sherryll BurgerShah   Encounter Date: 01/24/2019  PT End of Session - 01/24/19 1353    Visit Number  8    Number of Visits  17    Date for PT Re-Evaluation  02/21/19    Authorization Type  8/10 progress note    PT Start Time  1353    PT Stop Time  1458    PT Time Calculation (min)  65 min    Activity Tolerance  Patient tolerated treatment well    Behavior During Therapy  [facility] for tasks assessed/performed       Past Medical History:  Diagnosis Date  . Anemia   . BPH (benign prostatic hypertrophy)   . Diabetes type 2, controlled (HCC)   . GERD (gastroesophageal reflux disease)    RARE  . Glaucoma    Syndor  . Hearing loss    bilateral hearing aids occasionally  . History of kidney stones   . HLD (hyperlipidemia)   . HTN (hypertension)   . Macular degeneration    Right ([place])  . Osteoarthritis    thumbs, hips, knees (s/p R TKR)  . PUD (peptic ulcer disease) 03/2013   2 antral gastric ulcers and 1 duodenal ulcer, NSAID related, [facility]/anemia s/p EGD  . Sleep apnea    CPAP  . Tremor    intention    Past Surgical History:  Procedure Laterality Date  . BELPHAROPTOSIS REPAIR  2012  . carotid US  12/2011   no significant stenosis  . CATARACT EXTRACTION Bilateral 2011, 2012  . CHOLECYSTECTOMY  1997  . COLONOSCOPY  2009   per patient, told no longer needed  . CYSTOSCOPY W/ URETERAL STENT PLACEMENT Left 11/01/2016   Procedure: CYSTOSCOPY WITH STENT REPLACEMENT;  Surgeon: Vanna ScotlandBrandon, Ashley, MD;  Location: [facility] ORS;  Service: Urology;  Laterality: Left;  . CYSTOSCOPY WITH STENT PLACEMENT Left 10/26/2016   Procedure: CYSTOSCOPY WITH STENT PLACEMENT;  Surgeon: Vanna ScotlandBrandon, Ashley,  MD;  Location: [facility] ORS;  Service: Urology;  Laterality: Left;  . ESOPHAGOGASTRODUODENOSCOPY  04/2013   hospitalization @ [facility], gastritis, gastric ulcers and duodenal ulcer with clean base ([place])  . HOLEP-LASER ENUCLEATION OF THE PROSTATE WITH MORCELLATION N/A 11/01/2016   Procedure: HOLEP-LASER ENUCLEATION OF THE PROSTATE WITH MORCELLATION;  Surgeon: Vanna ScotlandBrandon, Ashley, MD;  Location: [facility] ORS;  Service: Urology;  Laterality: N/A;  . KIDNEY STONE SURGERY  83, 87, 90  . TOTAL KNEE ARTHROPLASTY Right 2006  . URETEROSCOPY Left 10/26/2016   Procedure: URETEROSCOPY;  Surgeon: Vanna ScotlandBrandon, Ashley, MD;  Location: [facility] ORS;  Service: Urology;  Laterality: Left;  . URETEROSCOPY WITH HOLMIUM LASER LITHOTRIPSY Left 11/01/2016   Procedure: URETEROSCOPY WITH HOLMIUM LASER LITHOTRIPSY;  Surgeon: Vanna ScotlandBrandon, Ashley, MD;  Location: [facility] ORS;  Service: Urology;  Laterality: Left;  . US ECHOCARDIOGRAPHY  12/2011   nl LV fxn, EF 60%, nl valves, dilated aortic root (40mm)    There were no vitals filed for this visit.  Subjective Assessment - 01/24/19 1352    Subjective  Patient reports    Patient Stated Goals  to learn the BIG exercises       LSVT: Patient seen for LSVT Daily Session Maximal Daily Exercises for facilitation/coordination of movement Maximum Sustained Movements are designed to rescale  the amplitude of movement output for generalization to daily functional activities. Performed as follows for 1 set of 10 repetitions each multi-directional sustained movements: 1) Floor to ceiling  2) Side to side multidirectional- cue to hold for 10 seconds and to extend leg   Repetitive movements performed in standing and are designed to provide retraining effort needed for sustained muscle activation in tasks. Performed as follows for 1 set of 10 repetitions each of multi-directional repetitive movements: 3) Step and reach forward step- able to perform without cuing with BIG arm abduction movement, good weight shift and  step 4) Step and reach sideways step-  Able to perform without cuing after looking at his LSVT BIG exercise packet 5) Step and reach backwards step- needed one cue to step backwards instead of forward and then he was able to do without further cuing 6) Rock and reach forward/backward- needed cuing and modeling; patient initially took too big a step with left foot and had lose of balance needing CGA to correct.  7) Rock and reach sideways- cue to pivot his feet and needed to look at [employer]  Functional Component Task: 1. Sit to stand BIG functional component task with supervision 5 reps  2.Practiced BIG handwriting in printed fonta different word today 5 reps 3.Practiced small hollow beads picking up one at a time as called out by therapist and placing them on the small green, plastic board 4.Practicedon Airex balance pad, sidestepping left/right 5 reps 5.Practicedon Airex balance pad, performed alternating step taps to 6" wooden step Cone tapping: In standing on Airex pad, patient performed foot tapping to cones in series of one and two cones as called out by therapist with CGA.  Patient had more difficulty doing single stance with left leg being support leg than right leg. Patient had difficult time with doing series of one and two cones tapping with right foot and had one loss of balance where he had to take a step off the Airex pad to regain his balance. Patient would benefit from continued practice with this activity.  Slow Marching: Patient performed on firm surface, slow marching with 3-5 second holds. Patient required use of few fingers or one hand for balance to be able to hold when doing single leg stance on left leg.   Patient given daily carryover task to complete. To perform slow marching 10 reps each leg with one hand assist for balance as needed standing at kitchen counter.   BIG ambulation: Patient ambulated 1000'with BIG movements with increased step length and arm  swing with 1# weights each arm. Patient did well with maintaining BIG movements throughout the walking this date and requirecd one cue after 750' to maintain arm swing magnitude on the left as it started to diminish as patient fatigued.    PT Education - 01/24/19 1352    Education Details  exercise technique    Person(s) Educated  Patient    Methods  Explanation    Comprehension  Verbalized understanding       PT Short Term Goals - 01/10/19 1401      PT SHORT TERM GOAL #1   Title  Patient will be able to perform home program independently for self-management.    Time  4    Period  Weeks    Status  New    Target Date  02/05/19        PT Long Term Goals - 01/10/19 1402      PT LONG TERM GOAL #1  Title  Patient will reduce falls risk as indicated by Merrilee Jansky Balance Scale Score of > 50/56 to indicate decreased falls risk.    Baseline  scored 48/56 on 01/08/19    Time  8    Period  Weeks    Status  New    Target Date  02/21/19      PT LONG TERM GOAL #2   Title  Patient will complete five times sit to stand test in 13 seconds or less indicating an increased LE strength and improved balance.    Baseline  scored 16 seconds without use of UEs on 01/08/2019    Time  8    Period  Weeks    Status  New    Target Date  02/21/19      PT LONG TERM GOAL #3   Title  Patient will report 50% improvements in his finger dexterity as evidenced by improved hand writing ability and improved ability to work on his model trains    Time  [place]    Target Date  02/21/19            Plan - 01/24/19 1353    Clinical Impression Statement  Patient continues to work towards independence with home exercise program. Patient was able to look at his home exercise program packet with notes and perform several of the exercises but did require cuing and modeling for some of the more difficult standing LSVT BIG exercises. Patient working on high level balance exercises and noted  that patient has difficulty with uneven surfaces and with single leg stance. Will continue to try to work on hip strengthening and balance exercise to help reduce patient's fall risk. Encouraged patient to follow-up as indicated.    Personal Factors and Comorbidities  Comorbidity 3+;Age    Comorbidities  HTN, DM, OA, glaucoma, Parkinson's Disease, patient reports significan visual impairments due to macular degeneration    Examination-Participation Restrictions  Other   Hobby train building   Stability/Clinical Decision Making  Evolving/Moderate complexity    Rehab Potential  Good    PT Frequency  4x / week    PT Duration  4 weeks    PT Treatment/Interventions  ADLs/Self Care Home Management;Stair training;Gait training;Neuromuscular re-education;Therapeutic activities;Therapeutic exercise;Balance training;Patient/family education    Consulted and Agree with Plan of Care  Patient       Patient will benefit from skilled therapeutic intervention in order to improve the following deficits and impairments:  Decreased balance, Decreased endurance, Decreased strength, Decreased mobility, Difficulty walking, Impaired vision/preception  Visit Diagnosis: Difficulty in walking, not elsewhere classified  Muscle weakness (generalized)     Problem List Patient Active Problem List   Diagnosis Date Noted  . Loss of weight 04/20/2013  . Greater trochanteric bursitis of left hip 03/07/2013  . BPH (benign prostatic hypertrophy)   . HTN (hypertension)   . Diabetes type 2, uncontrolled ([place])   . Osteoarthritis   . Glaucoma   . HLD (hyperlipidemia)    Lady Deutscher PT, DPT [phone] Lady Deutscher 01/24/2019, 3:21 PM  [place] MAIN [facility] SERVICES [address] [place], [place], [address] Phone: [phone]   Fax:  [phone]  Name: [facility] [name] MRN: [id number] Date of Birth: Dec 19, 1935

## 2019-01-25 ENCOUNTER — Ambulatory Visit: Payer: Medicare Other | Admitting: Physical Therapy

## 2019-01-25 ENCOUNTER — Encounter: Payer: Self-pay | Admitting: Speech Pathology

## 2019-01-25 ENCOUNTER — Encounter: Payer: Self-pay | Admitting: Physical Therapy

## 2019-01-25 ENCOUNTER — Ambulatory Visit: Payer: Medicare Other | Admitting: Speech Pathology

## 2019-01-25 ENCOUNTER — Other Ambulatory Visit: Payer: Self-pay

## 2019-01-25 DIAGNOSIS — R490 Dysphonia: Secondary | ICD-10-CM

## 2019-01-25 DIAGNOSIS — M6281 Muscle weakness (generalized): Secondary | ICD-10-CM

## 2019-01-25 DIAGNOSIS — R262 Difficulty in walking, not elsewhere classified: Secondary | ICD-10-CM

## 2019-01-25 NOTE — Therapy (Signed)
[facility] [address] [place], [place], [address] Phone: [phone]   Fax:  [phone]  Physical Therapy Treatment  Patient Details  Name: [facility] [name] MRN: [id number] Date of Birth: December 13, 1935 Referring Provider (PT): Dr. Sherryll [name]   Encounter Date: 01/25/2019  PT End of Session - 01/25/19 1402    Visit Number  9    Number of Visits  17    Date for PT Re-Evaluation  02/21/19    Authorization Type  9/10 progress note    PT Start Time  1356    PT Stop Time  1503    PT Time Calculation (min)  67 min    Activity Tolerance  Patient tolerated treatment well    Behavior During Therapy  [facility] for tasks assessed/performed       Past Medical History:  Diagnosis Date  . Anemia   . BPH (benign prostatic hypertrophy)   . Diabetes type 2, controlled (HCC)   . GERD (gastroesophageal reflux disease)    RARE  . Glaucoma    Syndor  . Hearing loss    bilateral hearing aids occasionally  . History of kidney stones   . HLD (hyperlipidemia)   . HTN (hypertension)   . Macular degeneration    Right ([place])  . Osteoarthritis    thumbs, hips, knees (s/p R TKR)  . PUD (peptic ulcer disease) 03/2013   2 antral gastric ulcers and 1 duodenal ulcer, NSAID related, [facility] with melena/anemia s/p EGD  . Sleep apnea    CPAP  . Tremor    intention    Past Surgical History:  Procedure Laterality Date  . BELPHAROPTOSIS REPAIR  2012  . carotid US  12/2011   no significant stenosis  . CATARACT EXTRACTION Bilateral 2011, 2012  . CHOLECYSTECTOMY  1997  . COLONOSCOPY  2009   per patient, told no longer needed  . CYSTOSCOPY W/ URETERAL STENT PLACEMENT Left 11/01/2016   Procedure: CYSTOSCOPY WITH STENT REPLACEMENT;  Surgeon: [facility] ScotlandBrandon, Ashley, MD;  Location: [name];  Service: Urology;  Laterality: Left;  . CYSTOSCOPY WITH STENT PLACEMENT Left 10/26/2016   Procedure: CYSTOSCOPY WITH STENT PLACEMENT;  Surgeon: [facility] ScotlandBrandon, Ashley,  MD;  Location: [name];  Service: Urology;  Laterality: Left;  . ESOPHAGOGASTRODUODENOSCOPY  04/2013   hospitalization @ [facility], gastritis, gastric ulcers and duodenal ulcer with clean base ([name])  . HOLEP-LASER ENUCLEATION OF THE PROSTATE WITH MORCELLATION N/A 11/01/2016   Procedure: HOLEP-LASER ENUCLEATION OF THE PROSTATE WITH MORCELLATION;  Surgeon: [facility] ScotlandBrandon, Ashley, MD;  Location: [name];  Service: Urology;  Laterality: N/A;  . KIDNEY STONE SURGERY  83, 87, 90  . TOTAL KNEE ARTHROPLASTY Right 2006  . URETEROSCOPY Left 10/26/2016   Procedure: URETEROSCOPY;  Surgeon: [facility] ScotlandBrandon, Ashley, MD;  Location: [name];  Service: Urology;  Laterality: Left;  . URETEROSCOPY WITH HOLMIUM LASER LITHOTRIPSY Left 11/01/2016   Procedure: URETEROSCOPY WITH HOLMIUM LASER LITHOTRIPSY;  Surgeon: [facility] ScotlandBrandon, Ashley, MD;  Location: [name];  Service: Urology;  Laterality: Left;  . US ECHOCARDIOGRAPHY  12/2011   nl LV fxn, EF 60%, nl valves, dilated aortic root (40mm)    There were no vitals filed for this visit.  Subjective Assessment - 01/25/19 1401    Subjective  Patient states he did his homework last night and states that it is very challenging to try to stand on one foot.    Pertinent History  Patient reports he was diagnosed with Parkinson's Disease December 23, 2016. Patient reports  he has not noticed any weakness on one side of his body compared to the other. Since the COVID epidemic, patient reports that he has become more physically active and has lost weight. Patient is gradually built up to now being able to walk 1 mile a day. Patient reports he was walking with a cane prior to COVID and now is ambulating without assistive device. Patient states he feels good. Patient states he lives in a retirement community, [facility], and was using the gym at first until COVID. Then, he started walking 3-4 days a week in April and towards the end of the month now he walks daily. Patient is on weight watchers program and has lost  about 20 pounds. Patient reports that before he lost weight and got in better shape his Parkinson's medication was not working as well and he was told that they might have to increase his dosage, but patient states he saw his neurologist recently and they did not need to adjust his medications. Patient usually uses [facility] for community mobility at times. Patient reports he has the most difficulty with dexterity in his fingertips and with stamina/endurance. 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 Patient wears glasses. Patient reports he only has 50-60% vision due to macular degeneration. 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 Patient gets tremors in the evening and when fatigued left hand tremors more than right.  Patient reports his hand writing has declined since his diagnosis. Patient reports that the medication helps his symptoms and that fatigue worsens his symptoms of Parkinson's. Patient reports that he has difficult with bending and walking. Patient would like to work on improving his balance, increase his walking endurance, be better able to work on model trains and with his speech. Patient is also enrolled in the LSVT LOUD program.    Patient Stated Goals  to learn the BIG exercises          LSVT: Patient seen for LSVT Daily Session Maximal Daily Exercises for facilitation/coordination of movement Maximum Sustained Movements are designed to rescale the amplitude of movement output for generalization to daily functional activities. Performed as follows for 1 set of 10 repetitions each multi-directional sustained movements: 1) Floor to ceiling 1 cue to hold for a count of 10 and then able to perform independently 2) Side to side multidirectional- independent  Repetitive movements performed in standing and are designed to provide retraining effort needed for sustained muscle activation in  tasks. Performed as follows for 1 set of 10 repetitions each of multi-directional repetitive movements: 3) Step and reach forward step- Independent with #1 arm weights 4) Step and reach sideways step- Patient needed modeling initially to perform this exercise and then was able to do subsequent repetitions; performed with #1 arm weights 5) Step and reach backwards step-one lose of balance able to self-correct and needed to look at HEP to read the cues on how to perform; performed with #1 arm weights 6) Rock and reach forward/backward- need cuing for technique and wrote the cue on the HEP packet for carryover at home; cue to not take as big a step with right foot; performed with #1 arm weights 7) Rock and reach sideways- cue to pivot feet and to keep arms at same level of abduction throughout the exercise; performed with #1 arm weights  Functional Component Task: 1. Sit to stand  BIG functional component task with supervision 5 reps  2. Practiced small hollow beads picking up one at a time as called out by therapist and placing them on the small green, plastic board 3. Performed 15 reps mini squats without UEs support 4. Performed alternating step taps to soccer ball 15 reps each foot with faded UEs support progressing to no UEs support 5. Performed slow marching with 3 second holds with one hand support, then progressing to few finger support of one hand and then intermittent support of a few fingers.   Patient given daily carryover task to complete.To perform slow marching 10 reps each leg with one hand assist for balance standing at kitchen counter for safety.   BIG ambulation: Patient ambulated 500'with BIG movements with increased step length and arm swing. Patient had lose of balance catching left toe during swing phase and was assisted to sitting.     PT Education - 01/25/19 1401    Education Details  reviewed LSVT BIG exercises    Person(s) Educated  Patient    Methods   Explanation    Comprehension  Verbalized understanding       PT Short Term Goals - 01/10/19 1401      PT SHORT TERM GOAL #1   Title  Patient will be able to perform home program independently for self-management.    Time  4    Period  Weeks    Status  New    Target Date  02/05/19        PT Long Term Goals - 01/10/19 1402      PT LONG TERM GOAL #1   Title  Patient will reduce falls risk as indicated by Merrilee Jansky Balance Scale Score of > 50/56 to indicate decreased falls risk.    Baseline  scored 48/56 on 01/08/19    Time  8    Period  Weeks    Status  New    Target Date  02/21/19      PT LONG TERM GOAL #2   Title  Patient will complete five times sit to stand test in 13 seconds or less indicating an increased LE strength and improved balance.    Baseline  scored 16 seconds without use of UEs on 01/08/2019    Time  8    Period  Weeks    Status  New    Target Date  02/21/19      PT LONG TERM GOAL #3   Title  Patient will report 50% improvements in his finger dexterity as evidenced by improved hand writing ability and improved ability to work on his model trains    Time  [place]    Target Date  02/21/19            Plan - 01/25/19 1403    Clinical Impression Statement  Patient continues to make progress towards independent with the LSVT BIG home exercises. Patient needed a efw modeling and a few verbal cues for technique. Patient did better with being able to perform slow marching with 3 second holds iwht faded UEs support and alternating ball circles each foot. Patient would benefit from continued PT services to further address goals as set on plan of care.    Personal Factors and Comorbidities  Comorbidity 3+;Age    Comorbidities  HTN, DM, OA, glaucoma, Parkinson's Disease, patient reports significan visual impairments due to macular degeneration    Examination-Participation Restrictions  Other   Hobby train building   Stability/Clinical Decision  Making  Evolving/Moderate complexity    Rehab Potential  Good    PT Frequency  4x / week    PT Duration  4 weeks    PT Treatment/Interventions  ADLs/Self Care Home Management;Stair training;Gait training;Neuromuscular re-education;Therapeutic activities;Therapeutic exercise;Balance training;Patient/family education    Consulted and Agree with Plan of Care  Patient       Patient will benefit from skilled therapeutic intervention in order to improve the following deficits and impairments:  Decreased balance, Decreased endurance, Decreased strength, Decreased mobility, Difficulty walking, Impaired vision/preception  Visit Diagnosis: Difficulty in walking, not elsewhere classified  Muscle weakness (generalized)     Problem List Patient Active Problem List   Diagnosis Date Noted  . Loss of weight 04/20/2013  . Greater trochanteric bursitis of left hip 03/07/2013  . BPH (benign prostatic hypertrophy)   . HTN (hypertension)   . Diabetes type 2, uncontrolled (HCC)   . Osteoarthritis   . Glaucoma   . HLD (hyperlipidemia)     Aleka Twitty 01/25/2019, 3:43 PM  [place] [facility] [address] [place], [place], [address] Phone: [phone]   Fax:  [phone]  Name: [name] MRN: [id number] Date of Birth: [birth year]

## 2019-01-25 NOTE — Therapy (Signed)
[facility]IN [facility] [address] [place], [place], [address] Phone: [phone]   Fax:  [phone]  Speech Language Pathology Treatment  Patient Details  Name: Bradley GullyRonald [name] MRN: [id number] Date of Birth: 09/26/1935 Referring Provider (SLP): Dr. Sherryll BurgerShah   Encounter Date: 01/25/2019  End of Session - 01/25/19 1602    Visit Number  9    Number of Visits  17    Date for SLP Re-Evaluation  02/12/19    Authorization Type  Medicare    Authorization Time Period  Start 01/08/2019    Authorization - Visit Number  9    Authorization - Number of Visits  10    SLP Start Time  1500    SLP Stop Time   1550    SLP Time Calculation (min)  50 min    Activity Tolerance  Patient tolerated treatment well       Past Medical History:  Diagnosis Date  . Anemia   . BPH (benign prostatic hypertrophy)   . Diabetes type 2, controlled (HCC)   . GERD (gastroesophageal reflux disease)    RARE  . Glaucoma    [place]  . Hearing loss    bilateral hearing aids occasionally  . History of kidney stones   . HLD (hyperlipidemia)   . HTN (hypertension)   . Macular degeneration    Right ([place])  . Osteoarthritis    thumbs, hips, knees (s/p R TKR)  . PUD (peptic ulcer disease) 03/2013   2 antral gastric ulcers and 1 duodenal ulcer, NSAID related, [facility] with melena/anemia s/p EGD  . Sleep apnea    CPAP  . Tremor    intention    Past Surgical History:  Procedure Laterality Date  . BELPHAROPTOSIS REPAIR  2012  . carotid US  12/2011   no significant stenosis  . CATARACT EXTRACTION Bilateral 2011, 2012  . CHOLECYSTECTOMY  1997  . COLONOSCOPY  2009   per patient, told no longer needed  . CYSTOSCOPY W/ URETERAL STENT PLACEMENT Left 11/01/2016   Procedure: CYSTOSCOPY WITH STENT REPLACEMENT;  Surgeon: Vanna ScotlandBrandon, Ashley, MD;  Location: [facility] ORS;  Service: Urology;  Laterality: Left;  . CYSTOSCOPY WITH STENT PLACEMENT Left 10/26/2016   Procedure: CYSTOSCOPY WITH STENT PLACEMENT;  Surgeon: Vanna ScotlandBrandon, Ashley, MD;  Location: [facility] ORS;  Service: Urology;  Laterality: Left;  . ESOPHAGOGASTRODUODENOSCOPY  04/2013   hospitalization @ [facility], gastritis, gastric ulcers and duodenal ulcer with clean base ([place])  . HOLEP-LASER ENUCLEATION OF THE PROSTATE WITH MORCELLATION N/A 11/01/2016   Procedure: HOLEP-LASER ENUCLEATION OF THE PROSTATE WITH MORCELLATION;  Surgeon: Vanna ScotlandBrandon, Ashley, MD;  Location: [facility] ORS;  Service: Urology;  Laterality: N/A;  . KIDNEY STONE SURGERY  83, 87, 90  . TOTAL KNEE ARTHROPLASTY Right 2006  . URETEROSCOPY Left 10/26/2016   Procedure: URETEROSCOPY;  Surgeon: Vanna ScotlandBrandon, Ashley, MD;  Location: [facility] ORS;  Service: Urology;  Laterality: Left;  . URETEROSCOPY WITH HOLMIUM LASER LITHOTRIPSY Left 11/01/2016   Procedure: URETEROSCOPY WITH HOLMIUM LASER LITHOTRIPSY;  Surgeon: Vanna ScotlandBrandon, Ashley, MD;  Location: [facility] ORS;  Service: Urology;  Laterality: Left;  . US ECHOCARDIOGRAPHY  12/2011   nl LV fxn, EF 60%, nl valves, dilated aortic root (40mm)    There were no vitals filed for this visit.  Subjective Assessment - 01/25/19 1600    Subjective  Patient feel just prior to LSVT-LOUD session            ADULT SLP TREATMENT - 01/25/19 0001  General Information   Behavior/Cognition  Alert;Cooperative;Pleasant mood    HPI   83 year old man with Parkinson's disease       Treatment Provided   Treatment provided  Cognitive-Linquistic      Pain Assessment   Pain Assessment  No/denies pain      Cognitive-Linquistic Treatment   Treatment focused on  Voice    Skilled Treatment  Daily Task #1 (Maximum sustained "ah"): Average 9 seconds, 78 dB. Daily Task 2 (Maximum fundamental frequency range): Highs: 15 high pitched "ah" given max cues (275 Hz). Lows: 15 low pitched "ah" given max cues (105 Hz). Daily task #3 (Maximum speech loudness drill of functional phrases): Average 72 dB.  Hierarchal speech loudness drill:   Conversation, 70 dB.  Homework: assignments completed.  Off the cuff remarks: average 70 dB.        Assessment / Recommendations / Plan   Plan  Continue with current plan of care      Progression Toward Goals   Progression toward goals  Progressing toward goals       SLP Education - 01/25/19 1601    Education Details  LSVT-LOUD, compete with yourself    Person(s) Educated  Patient    Methods  Explanation    Comprehension  Verbalized understanding         SLP Long Term Goals - 01/08/19 1329      SLP LONG TERM GOAL #1   Title  The patient will complete Daily Tasks (Maximum duration "ah", High/Lows, and Functional Phrases) at average loudness of 80 dB and with loud, good quality voice.    Time  4    Period  Weeks    Status  New    Target Date  02/12/19      SLP LONG TERM GOAL #2   Title  The patient will complete Hierarchal Speech Loudness reading drills (words/phrases, sentences, and paragraph) at average 75 dB and with loud, good quality voice.    Time  4    Period  Weeks    Status  New    Target Date  02/12/19      SLP LONG TERM GOAL #3   Title  The patient will participate in conversation, maintaining average loudness of 75 dB and loud, good quality voice.    Time  4    Period  Weeks    Status  New    Target Date  02/12/19      SLP LONG TERM GOAL #4   Title  The patient will complete homework daily.    Time  4    Period  Weeks    Status  New    Target Date  02/12/19       Plan - 01/25/19 1603    Clinical Impression Statement  The patient is completing daily tasks and hierarchal speech drill tasks with louder, better quality voice given cues.  He appears to be more comfortable with loud voice.    Speech Therapy Frequency  4x / week    Duration  4 weeks    Treatment/Interventions  SLP instruction and feedback;Patient/family education;Other (comment)    Potential to Achieve Goals  Good    Potential Considerations  Ability to learn/carryover information;Previous  level of function;Co-morbidities;Severity of impairments;Cooperation/participation level;Medical prognosis;Family/community support    SLP Home Exercise Plan  LSVT-LOUD daily homework    Consulted and Agree with Plan of Care  Patient       Patient will benefit from skilled therapeutic intervention in  order to improve the following deficits and impairments:   Dysphonia    Problem List Patient Active Problem List   Diagnosis Date Noted  . Loss of weight 04/20/2013  . Greater trochanteric bursitis of left hip 03/07/2013  . BPH (benign prostatic hypertrophy)   . HTN (hypertension)   . Diabetes type 2, uncontrolled (HCC)   . Osteoarthritis   . Glaucoma   . HLD (hyperlipidemia)   Bradley Primrose, MS/CCC- SLP   Bradley [name] 01/25/2019, 4:04 PM  [place] MAIN [facility] SERVICES [address] [place], [place], [address] Phone: [phone]   Fax:  [phone]   Name: Bradley [name] MRN: [id number] Date of Birth: [birth year]

## 2019-01-30 ENCOUNTER — Other Ambulatory Visit: Payer: Self-pay

## 2019-01-30 ENCOUNTER — Encounter: Payer: Self-pay | Admitting: Physical Therapy

## 2019-01-30 ENCOUNTER — Encounter: Payer: Self-pay | Admitting: Speech Pathology

## 2019-01-30 ENCOUNTER — Ambulatory Visit: Payer: Medicare Other | Admitting: Physical Therapy

## 2019-01-30 ENCOUNTER — Ambulatory Visit: Payer: Medicare Other | Admitting: Speech Pathology

## 2019-01-30 DIAGNOSIS — R262 Difficulty in walking, not elsewhere classified: Secondary | ICD-10-CM

## 2019-01-30 DIAGNOSIS — R490 Dysphonia: Secondary | ICD-10-CM

## 2019-01-30 DIAGNOSIS — M6281 Muscle weakness (generalized): Secondary | ICD-10-CM

## 2019-01-30 NOTE — Therapy (Addendum)
[place] MAIN [facility] SERVICES [address] [place], [place], [address] Phone: [phone]   Fax:  [phone]  Physical Therapy Treatment/Progress Note Dates of reporting period  01/08/2019   to   02/12/2019 Number of treatment visits: 10  Patient Details  Name: Bradley [name] MRN: [id number] Date of Birth: 1935/05/30 Referring Provider (PT): Dr. Manuella Ghazi   Encounter Date: 01/30/2019  PT End of Session - 01/30/19 1412    Visit Number  10    Number of Visits  17    Date for PT Re-Evaluation  02/21/19    Authorization Type  10/10 progress note    PT Start Time  1408    PT Stop Time  1504    PT Time Calculation (min)  56 min    Equipment Utilized During Treatment  Gait belt    Activity Tolerance  Patient tolerated treatment well    Behavior During Therapy  WFL for tasks assessed/performed       Past Medical History:  Diagnosis Date  . Anemia   . BPH (benign prostatic hypertrophy)   . Diabetes type 2, controlled ([place])   . GERD (gastroesophageal reflux disease)    RARE  . Glaucoma    Syndor  . Hearing loss    bilateral hearing aids occasionally  . History of kidney stones   . HLD (hyperlipidemia)   . HTN (hypertension)   . Macular degeneration    Right ([place])  . Osteoarthritis    thumbs, hips, knees (s/p R TKR)  . PUD (peptic ulcer disease) 03/2013   2 antral gastric ulcers and 1 duodenal ulcer, NSAID related, admitted to [facility] with melena/anemia s/p EGD  . Sleep apnea    CPAP  . Tremor    intention    Past Surgical History:  Procedure Laterality Date  . [place]  2012  . carotid US  12/2011   no significant stenosis  . CATARACT EXTRACTION Bilateral 2011, 2012  . CHOLECYSTECTOMY  1997  . COLONOSCOPY  2009   per patient, told no longer needed  . CYSTOSCOPY W/ URETERAL STENT PLACEMENT Left 11/01/2016   Procedure: CYSTOSCOPY WITH STENT REPLACEMENT;  Surgeon: Hollice Espy, MD;  Location: [facility] ORS;   Service: Urology;  Laterality: Left;  . CYSTOSCOPY WITH STENT PLACEMENT Left 10/26/2016   Procedure: CYSTOSCOPY WITH STENT PLACEMENT;  Surgeon: Hollice Espy, MD;  Location: [facility] ORS;  Service: Urology;  Laterality: Left;  . ESOPHAGOGASTRODUODENOSCOPY  04/2013   hospitalization @ [place], gastritis, gastric ulcers and duodenal ulcer with clean base ([place])  . HOLEP-LASER ENUCLEATION OF THE PROSTATE WITH MORCELLATION N/A 11/01/2016   Procedure: HOLEP-LASER ENUCLEATION OF THE PROSTATE WITH MORCELLATION;  Surgeon: Hollice Espy, MD;  Location: [facility] ORS;  Service: Urology;  Laterality: N/A;  . KIDNEY STONE SURGERY  83, 87, 90  . TOTAL KNEE ARTHROPLASTY Right 2006  . URETEROSCOPY Left 10/26/2016   Procedure: URETEROSCOPY;  Surgeon: Hollice Espy, MD;  Location: [facility] ORS;  Service: Urology;  Laterality: Left;  . URETEROSCOPY WITH HOLMIUM LASER LITHOTRIPSY Left 11/01/2016   Procedure: URETEROSCOPY WITH HOLMIUM LASER LITHOTRIPSY;  Surgeon: Hollice Espy, MD;  Location: [facility] ORS;  Service: Urology;  Laterality: Left;  . US ECHOCARDIOGRAPHY  12/2011   nl LV fxn, EF 60%, nl valves, dilated aortic root (62m)    There were no vitals filed for this visit.  Subjective Assessment - 01/30/19 1410    Subjective  Patient states his left hip is sore from last week. Patient reports he  tried to walk everyday but reports he was not able to do his LSVT BIG exercises each day over the weekend because his hip was sore. Patient reports his hip discomfort goes away when he is reclined with his feet up and that it returns when he first stands up. Patient states that the more he walks and moves, the discomfort improves and lessens.    Pertinent History  Patient reports he was diagnosed with Parkinson's Disease December 23, 2016. Patient reports he has not noticed any weakness on one side of his body compared to the other. Since the COVID epidemic, patient reports that he has become more physically active and has lost weight.  Patient is gradually built up to now being able to walk 1 mile a day. Patient reports he was walking with a cane prior to [place] and now is ambulating without assistive device. Patient states he feels good. Patient states he lives in a retirement community, [name], and was using the gym at first until [place]. Then, he started walking 3-4 days a week in April and towards the end of the month now he walks daily. Patient is on weight watchers program and has lost about 20 pounds. Patient reports that before he lost weight and got in better shape his Parkinson's medication was not working as well and he was told that they might have to increase his dosage, but patient states he saw his neurologist recently and they did not need to adjust his medications. Patient usually uses [facility] for community mobility at times. Patient reports he has the most difficulty with dexterity in his fingertips and with stamina/endurance. 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 Patient wears glasses. Patient reports he only has 50-60% vision due to macular degeneration. 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 Patient gets tremors in the evening and when fatigued left hand tremors more than right.  Patient reports his hand writing has declined since his diagnosis. Patient reports that the medication helps his symptoms and that fatigue worsens his symptoms of Parkinson's. Patient reports that he has difficult with bending and walking. Patient would like to work on improving his balance, increase his walking endurance, be better able to work on model trains and with his speech. Patient is also enrolled in the LSVT LOUD program.    Patient Stated Goals  to learn the BIG exercises    Currently in Pain?  Yes    Pain Score  6     Pain Location  Hip    Pain Orientation  Left    Pain Descriptors / Indicators  Sharp    Pain Type  Acute  pain    Pain Onset  In the past 7 days       Patient reports his hip discomfort goes away when he is reclined with his feet up and that it returns when he first stands up. Patient states that the more he walks and moves, the discomfort improves and lessens.  Patient reports he usually takes two Tylenols per day, one in the am and one in the pm and reports he added one Aleve. Patient reports he was been having difficulty with constipation in past few weeks.    LSVT: Patient seen for LSVT Daily Session Maximal Daily Exercises for facilitation/coordination of movement Maximum Sustained Movements are designed to rescale the amplitude of movement output for  generalization to daily functional activities. Performed as follows for 1 set of 10 repetitions each multi-directional sustained movements: 1) Floor to ceiling  2) Side to side multidirectional   Repetitive movements performed in standing and are designed to provide retraining effort needed for sustained muscle activation in tasks. Performed as follows for 1 set of 10 repetitions each of multi-directional repetitive movements: 3) Step and reach forward step- patient did well with this exercise demonstrating good technique 4) Step and reach sideways step- patient well being able to perform this exercise after looking at his exercise packet notes.  5) Step and reach backwards step- patient needed modeling and cuing for technique with this exercise; pt initially trying to take a step forward instead of backward and cuing to shift weight to un-weight forward leg to allow for raising the toes/forefoot on that side.  6) Rock and reach forward/backward- cue to decrease the size of step and to stand erect without bending forward at the waist, but patient did well with shifting weight and alternating reaching. 7) Rock and reach sideways-  Felt stretching/ pulling sensation in left side with sideways twist to the left that eased when he sat. Need cuing to  turn foot out on the side patient was stepping towards and modeling for the twisting component and to turn head towards the side patient stepped towards.   Functional Component Task: 1. Sit to stand BIG functional component task with supervision 5 reps  2. Practiced BIG handwriting alternating betweenprintedand cursive fontthe word exercises- 5 reps 3. Practiced threading small beads onto opened paper clip initially threading with right hand and then threading with left hand.  4. Performed 10 reps heel raises 5. Performed 20 reps toe raises  Patient given daily carryover task to complete. Patient to practice doing 10 reps toe raises and heel raises holding onto kitchen counter.  BIG ambulation:  Patient ambulated 500' with BIG arm and leg movements while able to hold a conversation and maintain BIG movements. Patient did well with transitions between tile flooring and carpeting without slowing his pace or step length and patient had no episodes of toe drag in swing phase.    PT Education - 01/30/19 1520    Education Details  reviewed LSVT BIG exercises and reviewed technique; toe raises and heel raises holding kitchen counter for support    Person(s) Educated  Patient    Methods  Explanation;Demonstration;Verbal cues    Comprehension  Verbalized understanding;Returned demonstration       PT Short Term Goals - 01/30/19 1522      PT SHORT TERM GOAL #1   Title  Patient will be able to perform home program independently for self-management.    Time  4    Period  Weeks    Status  Partially Met    Target Date  02/05/19        PT Long Term Goals - 01/30/19 1522      PT LONG TERM GOAL #1   Title  Patient will reduce falls risk as indicated by Merrilee Jansky Balance Scale Score of > 50/56 to indicate decreased falls risk.    Baseline  scored 48/56 on 01/08/19    Time  8    Period  Weeks    Status  On-going      PT LONG TERM GOAL #2   Title  Patient will complete five times sit to stand  test in 13 seconds or less indicating an increased LE strength and improved balance.  Baseline  scored 16 seconds without use of UEs on 01/08/2019    Time  8    Period  Weeks    Status  On-going      PT LONG TERM GOAL #3   Title  Patient will report 50% improvements in his finger dexterity as evidenced by improved hand writing ability and improved ability to work on his model trains    Time  [place] - 01/30/19 1413    Clinical Impression Statement  Patient has paritally met short term goal of independence with home exercise program as he is still requiring modeling or verbal cuing at times for inital technique with a few of the standing LSVT BIG exercises. Patient's progress towards the 3 long term goals is on-going. Patient would benefit from continued PT services to further gain independence with performing LSVT BIG exercises with good form. Patient did well with heel raises but was challenged by toe raises this date. Will continue to work on progressions of activities to work towards achieving goals as set on plan of care.    Personal Factors and Comorbidities  Comorbidity 3+;Age    Comorbidities  HTN, DM, OA, glaucoma, Parkinson's Disease, patient reports significan visual impairments due to macular degeneration    Examination-Participation Restrictions  Other   Hobby train building   Stability/Clinical Decision Making  Evolving/Moderate complexity    Rehab Potential  Good    PT Frequency  4x / week    PT Duration  4 weeks    PT Treatment/Interventions  ADLs/Self Care Home Management;Stair training;Gait training;Neuromuscular re-education;Therapeutic activities;Therapeutic exercise;Balance training;Patient/family education    Consulted and Agree with Plan of Care  Patient       Patient will benefit from skilled therapeutic intervention in order to improve the following deficits and impairments:  Decreased balance, Decreased  endurance, Decreased strength, Decreased mobility, Difficulty walking, Impaired vision/preception  Visit Diagnosis: Difficulty in walking, not elsewhere classified  Muscle weakness (generalized)     Problem List Patient Active Problem List   Diagnosis Date Noted  . Loss of weight 04/20/2013  . Greater trochanteric bursitis of left hip 03/07/2013  . BPH (benign prostatic hypertrophy)   . HTN (hypertension)   . Diabetes type 2, uncontrolled ([place])   . Osteoarthritis   . Glaucoma   . HLD (hyperlipidemia)    Lady Deutscher PT, DPT [id number] Lady Deutscher 01/30/2019, 3:44 PM  [place] MAIN [facility] SERVICES [address] [place], [place], [address] Phone: [phone]   Fax:  [phone]  Name: Bradley [name] MRN: [id number] Date of Birth: [birth year]

## 2019-01-30 NOTE — Therapy (Signed)
[place] MAIN [facility] SERVICES [address] [place], [place], [address] Phone: [phone]   Fax:  [phone]  Speech Language Pathology Treatment  Speech Therapy Progress Note   Dates of reporting period  01/08/2019   to   02/12/2019   Patient Details  Name: Bradley [name] MRN: [id number] Date of Birth: Nov 17, 1935 Referring Provider (SLP): Dr. Manuella Ghazi   Encounter Date: 01/30/2019  End of Session - 01/30/19 1554    Visit Number  10    Number of Visits  17    Date for SLP Re-Evaluation  02/12/19    Authorization Type  Medicare    Authorization Time Period  Start 01/08/2019    Authorization - Visit Number  10    Authorization - Number of Visits  10    SLP Start Time  1500    SLP Stop Time   1550    SLP Time Calculation (min)  50 min    Activity Tolerance  Patient tolerated treatment well       Past Medical History:  Diagnosis Date  . Anemia   . BPH (benign prostatic hypertrophy)   . Diabetes type 2, controlled ([place])   . GERD (gastroesophageal reflux disease)    RARE  . Glaucoma    [place]  . Hearing loss    bilateral hearing aids occasionally  . History of kidney stones   . HLD (hyperlipidemia)   . HTN (hypertension)   . Macular degeneration    Right ([place])  . Osteoarthritis    thumbs, hips, knees (s/p R TKR)  . PUD (peptic ulcer disease) 03/2013   2 antral gastric ulcers and 1 duodenal ulcer, NSAID related, admitted to [facility] with melena/anemia s/p EGD  . Sleep apnea    CPAP  . Tremor    intention    Past Surgical History:  Procedure Laterality Date  . [place]  2012  . carotid US  12/2011   no significant stenosis  . CATARACT EXTRACTION Bilateral 2011, 2012  . CHOLECYSTECTOMY  1997  . COLONOSCOPY  2009   per patient, told no longer needed  . CYSTOSCOPY W/ URETERAL STENT PLACEMENT Left 11/01/2016   Procedure: CYSTOSCOPY WITH STENT REPLACEMENT;  Surgeon: Hollice Espy, MD;  Location: [facility];   Service: Urology;  Laterality: Left;  . CYSTOSCOPY WITH STENT PLACEMENT Left 10/26/2016   Procedure: CYSTOSCOPY WITH STENT PLACEMENT;  Surgeon: Hollice Espy, MD;  Location: [facility];  Service: Urology;  Laterality: Left;  . ESOPHAGOGASTRODUODENOSCOPY  04/2013   hospitalization @ [facility], gastritis, gastric ulcers and duodenal ulcer with clean base ([place])  . HOLEP-LASER ENUCLEATION OF THE PROSTATE WITH MORCELLATION N/A 11/01/2016   Procedure: HOLEP-LASER ENUCLEATION OF THE PROSTATE WITH MORCELLATION;  Surgeon: Hollice Espy, MD;  Location: [facility];  Service: Urology;  Laterality: N/A;  . KIDNEY STONE SURGERY  83, 87, 90  . TOTAL KNEE ARTHROPLASTY Right 2006  . URETEROSCOPY Left 10/26/2016   Procedure: URETEROSCOPY;  Surgeon: Hollice Espy, MD;  Location: [facility];  Service: Urology;  Laterality: Left;  . URETEROSCOPY WITH HOLMIUM LASER LITHOTRIPSY Left 11/01/2016   Procedure: URETEROSCOPY WITH HOLMIUM LASER LITHOTRIPSY;  Surgeon: Hollice Espy, MD;  Location: [facility];  Service: Urology;  Laterality: Left;  . US ECHOCARDIOGRAPHY  12/2011   nl LV fxn, EF 60%, nl valves, dilated aortic root (51m)    There were no vitals filed for this visit.  Subjective Assessment - 01/30/19 1553    Subjective  Patient states he feels better  from the fall last week            ADULT SLP TREATMENT - 01/30/19 0001      General Information   Behavior/Cognition  Alert;Cooperative;Pleasant mood    HPI   83 year old man with Parkinson's disease       Treatment Provided   Treatment provided  Cognitive-Linquistic      Pain Assessment   Pain Assessment  No/denies pain      Cognitive-Linquistic Treatment   Treatment focused on  Voice    Skilled Treatment  Daily Task #1 (Maximum sustained "ah"): Average 11 seconds, 85 dB. Daily Task 2 (Maximum fundamental frequency range): Highs: 15 high pitched "ah" given max cues (275 Hz). Lows: 15 low pitched "ah" given max cues (105 Hz). Daily task #3 (Maximum speech  loudness drill of functional phrases): Average 72 dB.  Hierarchal speech loudness drill:  Conversation, 70 dB.  Homework: assignments completed.  Off the cuff remarks: average 73 dB.        Assessment / Recommendations / Plan   Plan  Continue with current plan of care      Progression Toward Goals   Progression toward goals  Progressing toward goals       SLP Education - 01/30/19 1554    Education Details  LAVT-LOUD, compete with yourself    Person(s) Educated  Patient    Methods  Explanation    Comprehension  Verbalized understanding         SLP Long Term Goals - 01/30/19 1555      SLP LONG TERM GOAL #1   Title  The patient will complete Daily Tasks (Maximum duration "ah", High/Lows, and Functional Phrases) at average loudness of 80 dB and with loud, good quality voice.    Status  Partially Met    Target Date  02/12/19      SLP LONG TERM GOAL #2   Title  The patient will complete Hierarchal Speech Loudness reading drills (words/phrases, sentences, and paragraph) at average 75 dB and with loud, good quality voice.    Status  Partially Met    Target Date  02/12/19      SLP LONG TERM GOAL #3   Title  The patient will participate in conversation, maintaining average loudness of 75 dB and loud, good quality voice.    Status  Partially Met    Target Date  02/12/19      SLP LONG TERM GOAL #4   Title  The patient will complete homework daily.    Status  On-going    Target Date  02/12/19       Plan - 01/30/19 1555    Clinical Impression Statement  The patient is completing daily tasks and hierarchal speech drill tasks with louder, better quality voice given cues.  He appears to be more comfortable with loud voice.    Speech Therapy Frequency  4x / week    Duration  4 weeks    Treatment/Interventions  SLP instruction and feedback;Patient/family education;Other (comment)    Potential Considerations  Ability to learn/carryover information;Previous level of  function;Co-morbidities;Severity of impairments;Cooperation/participation level;Medical prognosis;Family/community support    SLP Home Exercise Plan  LSVT-LOUD daily homework    Consulted and Agree with Plan of Care  Patient       Patient will benefit from skilled therapeutic intervention in order to improve the following deficits and impairments:   Dysphonia    Problem List Patient Active Problem List   Diagnosis Date Noted  .  Loss of weight 04/20/2013  . Greater trochanteric bursitis of left hip 03/07/2013  . BPH (benign prostatic hypertrophy)   . HTN (hypertension)   . Diabetes type 2, uncontrolled ([place])   . Osteoarthritis   . Glaucoma   . HLD (hyperlipidemia)    Leroy Sea, MS/CCC- SLP  Lou Miner 01/30/2019, 3:57 PM  [place] MAIN [facility] SERVICES [address] [place], [place], [address] Phone: [phone]   Fax:  [phone]   Name: Edilson Vital MRN: [id number] Date of Birth: February 10, 1936

## 2019-01-31 ENCOUNTER — Encounter: Payer: Self-pay | Admitting: Physical Therapy

## 2019-01-31 ENCOUNTER — Ambulatory Visit: Payer: Medicare Other | Admitting: Physical Therapy

## 2019-01-31 ENCOUNTER — Other Ambulatory Visit: Payer: Self-pay

## 2019-01-31 ENCOUNTER — Encounter: Payer: Self-pay | Admitting: Speech Pathology

## 2019-01-31 ENCOUNTER — Ambulatory Visit: Payer: Medicare Other | Admitting: Speech Pathology

## 2019-01-31 DIAGNOSIS — R490 Dysphonia: Secondary | ICD-10-CM

## 2019-01-31 DIAGNOSIS — R262 Difficulty in walking, not elsewhere classified: Secondary | ICD-10-CM

## 2019-01-31 DIAGNOSIS — M6281 Muscle weakness (generalized): Secondary | ICD-10-CM

## 2019-01-31 NOTE — Therapy (Signed)
[place] MAIN [facility] SERVICES [address] [place], [place], [address] Phone: [phone]   Fax:  [phone]  Speech Language Pathology Treatment  Patient Details  Name: Bradley [name] MRN: [id number] Date of Birth: [birth year] Referring Provider (SLP): Dr. Manuella Ghazi   Encounter Date: 01/31/2019  End of Session - 01/31/19 1616    Visit Number  11    Number of Visits  17    Date for SLP Re-Evaluation  02/12/19    Authorization Type  Medicare    Authorization Time Period  Start 01/31/2019    Authorization - Visit Number  1    Authorization - Number of Visits  10    SLP Start Time  1500    SLP Stop Time   1550    SLP Time Calculation (min)  50 min    Activity Tolerance  Patient tolerated treatment well       Past Medical History:  Diagnosis Date  . Anemia   . BPH (benign prostatic hypertrophy)   . Diabetes type 2, controlled ([place])   . GERD (gastroesophageal reflux disease)    RARE  . Glaucoma    [place]  . Hearing loss    bilateral hearing aids occasionally  . History of kidney stones   . HLD (hyperlipidemia)   . HTN (hypertension)   . Macular degeneration    Right ([place])  . Osteoarthritis    thumbs, hips, knees (s/p R TKR)  . PUD (peptic ulcer disease) 03/2013   2 antral gastric ulcers and 1 duodenal ulcer, NSAID related, admitted to [facility] with melena/anemia s/p EGD  . Sleep apnea    CPAP  . Tremor    intention    Past Surgical History:  Procedure Laterality Date  . [place]  2012  . carotid US  12/2011   no significant stenosis  . CATARACT EXTRACTION Bilateral 2011, 2012  . CHOLECYSTECTOMY  1997  . COLONOSCOPY  2009   per patient, told no longer needed  . CYSTOSCOPY W/ URETERAL STENT PLACEMENT Left 11/01/2016   Procedure: CYSTOSCOPY WITH STENT REPLACEMENT;  Surgeon: Hollice Espy, MD;  Location: [facility];  Service: Urology;  Laterality: Left;  . CYSTOSCOPY WITH STENT PLACEMENT Left 10/26/2016   Procedure: CYSTOSCOPY WITH STENT PLACEMENT;  Surgeon: Hollice Espy, MD;  Location: [facility];  Service: Urology;  Laterality: Left;  . ESOPHAGOGASTRODUODENOSCOPY  04/2013   hospitalization @ [place], gastritis, gastric ulcers and duodenal ulcer with clean base ([place])  . HOLEP-LASER ENUCLEATION OF THE PROSTATE WITH MORCELLATION N/A 11/01/2016   Procedure: HOLEP-LASER ENUCLEATION OF THE PROSTATE WITH MORCELLATION;  Surgeon: Hollice Espy, MD;  Location: [facility];  Service: Urology;  Laterality: N/A;  . KIDNEY STONE SURGERY  83, 87, 90  . TOTAL KNEE ARTHROPLASTY Right 2006  . URETEROSCOPY Left 10/26/2016   Procedure: URETEROSCOPY;  Surgeon: Hollice Espy, MD;  Location: [facility];  Service: Urology;  Laterality: Left;  . URETEROSCOPY WITH HOLMIUM LASER LITHOTRIPSY Left 11/01/2016   Procedure: URETEROSCOPY WITH HOLMIUM LASER LITHOTRIPSY;  Surgeon: Hollice Espy, MD;  Location: [facility];  Service: Urology;  Laterality: Left;  . US ECHOCARDIOGRAPHY  12/2011   nl LV fxn, EF 60%, nl valves, dilated aortic root (44m)    There were no vitals filed for this visit.  Subjective Assessment - 01/31/19 1613    Subjective  Patient feels he can more accurately perceive his loudness when he turns down his hearing aid  ADULT SLP TREATMENT - 01/31/19 0001      General Information   Behavior/Cognition  Alert;Cooperative;Pleasant mood    HPI   83 year old man with Parkinson's disease       Treatment Provided   Treatment provided  Cognitive-Linquistic      Pain Assessment   Pain Assessment  No/denies pain      Cognitive-Linquistic Treatment   Treatment focused on  Voice    Skilled Treatment  Daily Task #1 (Maximum sustained "ah"): Average 11 seconds, 85 dB. Daily Task 2 (Maximum fundamental frequency range): Highs: 15 high pitched "ah" given max cues (330 Hz). Lows: 15 low pitched "ah" given max cues (110 Hz). Daily task #3 (Maximum speech loudness drill of functional phrases): Average 75  dB.  Hierarchal speech loudness drill:  Conversation, 70 dB.  Homework: assignments completed.  Off the cuff remarks: average 73 dB.        Assessment / Recommendations / Plan   Plan  Continue with current plan of care      Progression Toward Goals   Progression toward goals  Progressing toward goals       SLP Education - 01/31/19 1616    Education Details  LSVT-LOUD    Person(s) Educated  Patient    Methods  Explanation    Comprehension  Verbalized understanding         SLP Long Term Goals - 01/30/19 1555      SLP LONG TERM GOAL #1   Title  The patient will complete Daily Tasks (Maximum duration "ah", High/Lows, and Functional Phrases) at average loudness of 80 dB and with loud, good quality voice.    Status  Partially Met    Target Date  02/12/19      SLP LONG TERM GOAL #2   Title  The patient will complete Hierarchal Speech Loudness reading drills (words/phrases, sentences, and paragraph) at average 75 dB and with loud, good quality voice.    Status  Partially Met    Target Date  02/12/19      SLP LONG TERM GOAL #3   Title  The patient will participate in conversation, maintaining average loudness of 75 dB and loud, good quality voice.    Status  Partially Met    Target Date  02/12/19      SLP LONG TERM GOAL #4   Title  The patient will complete homework daily.    Status  On-going    Target Date  02/12/19       Plan - 01/31/19 1619    Clinical Impression Statement  The patient is completing daily tasks and hierarchal speech drill tasks with louder, better quality voice given cues.  He appears to be more comfortable with loud voice.    Speech Therapy Frequency  4x / week    Duration  4 weeks    Treatment/Interventions  SLP instruction and feedback;Patient/family education;Other (comment)    Potential to Achieve Goals  Good    Potential Considerations  Ability to learn/carryover information;Previous level of function;Co-morbidities;Severity of  impairments;Cooperation/participation level;Medical prognosis;Family/community support    SLP Home Exercise Plan  LSVT-LOUD daily homework    Consulted and Agree with Plan of Care  Patient       Patient will benefit from skilled therapeutic intervention in order to improve the following deficits and impairments:   Dysphonia    Problem List Patient Active Problem List   Diagnosis Date Noted  . Loss of weight 04/20/2013  . Greater trochanteric bursitis of  left hip 03/07/2013  . BPH (benign prostatic hypertrophy)   . HTN (hypertension)   . Diabetes type 2, uncontrolled ([place])   . Osteoarthritis   . Glaucoma   . HLD (hyperlipidemia)    Leroy Sea, MS/CCC- SLP  Lou Miner 01/31/2019, 4:20 PM  [place] MAIN [facility] SERVICES [address] [place], [place], [address] Phone: [phone]   Fax:  [phone]   Name: Bradley [name] MRN: [id number] Date of Birth: 1936/01/01

## 2019-01-31 NOTE — Therapy (Signed)
[place] MAIN [facility] SERVICES [address] [place], [place], [address] Phone: [phone]   Fax:  [phone]  Physical Therapy Treatment  Patient Details  Name: Bradley [name] MRN: [id number] Date of Birth: 1935-08-08 Referring Provider (PT): Dr. Manuella Ghazi   Encounter Date: 01/31/2019  PT End of Session - 01/31/19 1355    Visit Number  11    Number of Visits  17    Date for PT Re-Evaluation  02/21/19    Authorization Type  11/10 progress note    PT Start Time  1352    PT Stop Time  1454    PT Time Calculation (min)  62 min    Equipment Utilized During Treatment  Gait belt    Activity Tolerance  Patient tolerated treatment well    Behavior During Therapy  [facility] for tasks assessed/performed       Past Medical History:  Diagnosis Date  . Anemia   . BPH (benign prostatic hypertrophy)   . Diabetes type 2, controlled ([place])   . GERD (gastroesophageal reflux disease)    RARE  . Glaucoma    Syndor  . Hearing loss    bilateral hearing aids occasionally  . History of kidney stones   . HLD (hyperlipidemia)   . HTN (hypertension)   . Macular degeneration    Right ([place])  . Osteoarthritis    thumbs, hips, knees (s/p R TKR)  . PUD (peptic ulcer disease) 03/2013   2 antral gastric ulcers and 1 duodenal ulcer, NSAID related, admitted to [facility] with melena/anemia s/p EGD  . Sleep apnea    CPAP  . Tremor    intention    Past Surgical History:  Procedure Laterality Date  . [place]  2012  . carotid US  12/2011   no significant stenosis  . CATARACT EXTRACTION Bilateral 2011, 2012  . CHOLECYSTECTOMY  1997  . COLONOSCOPY  2009   per patient, told no longer needed  . CYSTOSCOPY W/ URETERAL STENT PLACEMENT Left 11/01/2016   Procedure: CYSTOSCOPY WITH STENT REPLACEMENT;  Surgeon: Hollice Espy, MD;  Location: [facility] ORS;  Service: Urology;  Laterality: Left;  . CYSTOSCOPY WITH STENT PLACEMENT Left 10/26/2016   Procedure:  CYSTOSCOPY WITH STENT PLACEMENT;  Surgeon: Hollice Espy, MD;  Location: [facility] ORS;  Service: Urology;  Laterality: Left;  . ESOPHAGOGASTRODUODENOSCOPY  04/2013   hospitalization @ [place], gastritis, gastric ulcers and duodenal ulcer with clean base ([place])  . HOLEP-LASER ENUCLEATION OF THE PROSTATE WITH MORCELLATION N/A 11/01/2016   Procedure: HOLEP-LASER ENUCLEATION OF THE PROSTATE WITH MORCELLATION;  Surgeon: Hollice Espy, MD;  Location: [facility] ORS;  Service: Urology;  Laterality: N/A;  . KIDNEY STONE SURGERY  83, 87, 90  . TOTAL KNEE ARTHROPLASTY Right 2006  . URETEROSCOPY Left 10/26/2016   Procedure: URETEROSCOPY;  Surgeon: Hollice Espy, MD;  Location: [facility] ORS;  Service: Urology;  Laterality: Left;  . URETEROSCOPY WITH HOLMIUM LASER LITHOTRIPSY Left 11/01/2016   Procedure: URETEROSCOPY WITH HOLMIUM LASER LITHOTRIPSY;  Surgeon: Hollice Espy, MD;  Location: [facility] ORS;  Service: Urology;  Laterality: Left;  . US ECHOCARDIOGRAPHY  12/2011   nl LV fxn, EF 60%, nl valves, dilated aortic root (79m)    There were no vitals filed for this visit.  Subjective Assessment - 01/31/19 1353    Subjective  Patient states he did not walk this morning because it was looking like it was going to rain. Patient states that he did his homework of doing calf raises and toe  raises standing at his [employer].    Pertinent History  Patient reports he was diagnosed with Parkinson's Disease December 23, 2016. Patient reports he has not noticed any weakness on one side of his body compared to the other. Since the COVID epidemic, patient reports that he has become more physically active and has lost weight. Patient is gradually built up to now being able to walk 1 mile a day. Patient reports he was walking with a cane prior to [place] and now is ambulating without assistive device. Patient states he feels good. Patient states he lives in a retirement community, [name], and was using the gym at first until [place]. Then,  he started walking 3-4 days a week in April and towards the end of the month now he walks daily. Patient is on weight watchers program and has lost about 20 pounds. Patient reports that before he lost weight and got in better shape his Parkinson's medication was not working as well and he was told that they might have to increase his dosage, but patient states he saw his neurologist recently and they did not need to adjust his medications. Patient usually uses [facility] for community mobility at times. Patient reports he has the most difficulty with dexterity in his fingertips and with stamina/endurance. 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 Patient wears glasses. Patient reports he only has 50-60% vision due to macular degeneration. 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 Patient gets tremors in the evening and when fatigued left hand tremors more than right.  Patient reports his hand writing has declined since his diagnosis. Patient reports that the medication helps his symptoms and that fatigue worsens his symptoms of Parkinson's. Patient reports that he has difficult with bending and walking. Patient would like to work on improving his balance, increase his walking endurance, be better able to work on model trains and with his speech. Patient is also enrolled in the LSVT LOUD program.    Patient Stated Goals  to learn the BIG exercises    Currently in Pain?  No/denies    Pain Onset  In the past 7 days        Patient reports that he continues to have an issue with being off his feet and then when he first goes to stand, the first step or two he is getting a sharp pain in his left hip. Patient states when he walks the pain goes away.    LSVT: Patient seen for LSVT Daily Session Maximal Daily Exercises for facilitation/coordination of movement Maximum Sustained Movements are designed to rescale the  amplitude of movement output for generalization to daily functional activities. Performed as follows for 1 set of 10 repetitions each multi-directional sustained movements: 1) Floor to ceiling- patient able to perform this exercise independently without cuing 2) Side to side multidirectional- patient able to perform this exercise independently without cuing  Repetitive movements performed in standing and are designed to provide retraining effort needed for sustained muscle activation in tasks. Performed as follows for 1 set of 10 repetitions each of multi-directional repetitive movements: 3) Step and reach forward step- required 1 cue to bring his hands back/ arms in extension and then patient was able to perform all subsequent reps with good form 4) Step and reach sideways step- modeling cue to take a step to the side as initially  patient was not stepping and just pivoting one foot 5) Step and reach backwards step- cue to look at the exercise in patient's HEP LSVT BIG and read the cue that was written to step back and bend forward and then patient able to perform without any further cuing or modeling 6) Rock and reach forward/backward- cue to not take too big of a step and to keep trunk upright as patient tends to lean forward. Patient was able to self-correct when he switched to the other side. 7) Rock and reach sideways- patient did better with this exercise this date as patient was able to look at his LSVT HEP packet and look at the written cue and then he was able to complete the exercise with good form  Functional Component Task: 1.Sit to stand BIG functional component task with supervision 5 reps  2. Practiced BIG handwriting alternating betweenprintedand cursive fontthe word exercises- 6 reps 3. Practiced picking up small beads as pointed out by therapist and then placing them on a sheet of paper with targets for placement. Performed first using right UE and then did L UE  4. Performed 2  sets of 10 reps heel raises  5. Performed 2 sets of 10 reps toe raises with UEs support  Worked on toe walking and heel walking in the // bars multiple reps. Worked on [employer] balance beam, sidestepping L/R multiple reps and then performed static sideways stance while doing body turns in // bars with CGA.  Patient given daily carryover task to complete. Patient to practice doing 10 reps toe raises and heel raises holding onto kitchen counter.  BIG ambulation:  Patient ambulated 1000' with BIG arm and leg movements while able to hold a conversation and then did ABC naming task and maintain BIG movements. Patient had no episodes of toe drag in swing phase.     PT Education - 01/31/19 1714    Education Details  LSVT BIG exercises reviewed    Person(s) Educated  Patient    Methods  Explanation    Comprehension  Returned demonstration       PT Short Term Goals - 01/30/19 1522      PT SHORT TERM GOAL #1   Title  Patient will be able to perform home program independently for self-management.    Time  4    Period  Weeks    Status  Partially Met    Target Date  02/05/19        PT Long Term Goals - 01/30/19 1522      PT LONG TERM GOAL #1   Title  Patient will reduce falls risk as indicated by Merrilee Jansky Balance Scale Score of > 50/56 to indicate decreased falls risk.    Baseline  scored 48/56 on 01/08/19    Time  8    Period  Weeks    Status  On-going      PT LONG TERM GOAL #2   Title  Patient will complete five times sit to stand test in 13 seconds or less indicating an increased LE strength and improved balance.    Baseline  scored 16 seconds without use of UEs on 01/08/2019    Time  8    Period  Weeks    Status  On-going      PT LONG TERM GOAL #3   Title  Patient will report 50% improvements in his finger dexterity as evidenced by improved hand writing ability and improved ability to work on his model  trains    Time  8    Period  Weeks    Status  On-going            Plan  - 01/31/19 1716    Clinical Impression Statement  Patient did better this date and was able to fully perform each exercise. Patient had several exercises that he was able to perform independently and with the remaining exercises, patient was able to perform correctly after a single cue or by reading looking at the patient's LSVT BIG HEP. Patient did well this date with activities on [employer] balance beam. Patient challenged by toe and heel walking but was able to perform without UEs support. Patient would benefit from continued PT services to further work on balance progressions and complete independence with HEP.    Personal Factors and Comorbidities  Comorbidity 3+;Age    Comorbidities  HTN, DM, OA, glaucoma, Parkinson's Disease, patient reports significan visual impairments due to macular degeneration    Examination-Participation Restrictions  Other   Hobby train building   Stability/Clinical Decision Making  Evolving/Moderate complexity    Rehab Potential  Good    PT Frequency  4x / week    PT Duration  4 weeks    PT Treatment/Interventions  ADLs/Self Care Home Management;Stair training;Gait training;Neuromuscular re-education;Therapeutic activities;Therapeutic exercise;Balance training;Patient/family education    Consulted and Agree with Plan of Care  Patient       Patient will benefit from skilled therapeutic intervention in order to improve the following deficits and impairments:  Decreased balance, Decreased endurance, Decreased strength, Decreased mobility, Difficulty walking, Impaired vision/preception  Visit Diagnosis: Difficulty in walking, not elsewhere classified  Muscle weakness (generalized)     Problem List Patient Active Problem List   Diagnosis Date Noted  . Loss of weight 04/20/2013  . Greater trochanteric bursitis of left hip 03/07/2013  . BPH (benign prostatic hypertrophy)   . HTN (hypertension)   . Diabetes type 2, uncontrolled ([place])   . Osteoarthritis   .  Glaucoma   . HLD (hyperlipidemia)     Arlette Schaad 01/31/2019, 5:31 PM  [place] MAIN [facility] SERVICES [address] [place], [place], [address] Phone: [phone]   Fax:  [phone]  Name: Cambell Stanek MRN: [id number] Date of Birth: 25-Dec-1935

## 2019-02-01 ENCOUNTER — Encounter: Payer: Self-pay | Admitting: Speech Pathology

## 2019-02-01 ENCOUNTER — Other Ambulatory Visit: Payer: Self-pay

## 2019-02-01 ENCOUNTER — Ambulatory Visit: Payer: Medicare Other | Admitting: Physical Therapy

## 2019-02-01 ENCOUNTER — Encounter: Payer: Self-pay | Admitting: Physical Therapy

## 2019-02-01 ENCOUNTER — Ambulatory Visit: Payer: Medicare Other | Admitting: Speech Pathology

## 2019-02-01 DIAGNOSIS — R490 Dysphonia: Secondary | ICD-10-CM | POA: Diagnosis not present

## 2019-02-01 DIAGNOSIS — R262 Difficulty in walking, not elsewhere classified: Secondary | ICD-10-CM

## 2019-02-01 DIAGNOSIS — M6281 Muscle weakness (generalized): Secondary | ICD-10-CM

## 2019-02-01 NOTE — Therapy (Signed)
[place] MAIN [facility] SERVICES [address] [place], [place], [address] Phone: [phone]   Fax:  [phone]  Speech Language Pathology Treatment  Patient Details  Name: Bradley [name] MRN: [id number] Date of Birth: 07-29-1935 Referring Provider (SLP): Dr. Manuella Ghazi   Encounter Date: 02/01/2019  End of Session - 02/01/19 1630    Visit Number  12    Number of Visits  17    Date for SLP Re-Evaluation  02/12/19    Authorization Type  Medicare    Authorization Time Period  Start 01/31/2019    Authorization - Visit Number  2    Authorization - Number of Visits  10    SLP Start Time  1500    SLP Stop Time   1550    SLP Time Calculation (min)  50 min    Activity Tolerance  Patient tolerated treatment well       Past Medical History:  Diagnosis Date  . Anemia   . BPH (benign prostatic hypertrophy)   . Diabetes type 2, controlled ([place])   . GERD (gastroesophageal reflux disease)    RARE  . Glaucoma    Syndor  . Hearing loss    bilateral hearing aids occasionally  . History of kidney stones   . HLD (hyperlipidemia)   . HTN (hypertension)   . Macular degeneration    Right ([place])  . Osteoarthritis    thumbs, hips, knees (s/p R TKR)  . PUD (peptic ulcer disease) 03/2013   2 antral gastric ulcers and 1 duodenal ulcer, NSAID related, admitted to [facility] with melena/anemia s/p EGD  . Sleep apnea    CPAP  . Tremor    intention    Past Surgical History:  Procedure Laterality Date  . [place]  2012  . carotid US  12/2011   no significant stenosis  . CATARACT EXTRACTION Bilateral 2011, 2012  . CHOLECYSTECTOMY  1997  . COLONOSCOPY  2009   per patient, told no longer needed  . CYSTOSCOPY W/ URETERAL STENT PLACEMENT Left 11/01/2016   Procedure: CYSTOSCOPY WITH STENT REPLACEMENT;  Surgeon: Hollice Espy, MD;  Location: [facility] ORS;  Service: Urology;  Laterality: Left;  . CYSTOSCOPY WITH STENT PLACEMENT Left 10/26/2016   Procedure: CYSTOSCOPY WITH STENT PLACEMENT;  Surgeon: Hollice Espy, MD;  Location: [facility] ORS;  Service: Urology;  Laterality: Left;  . ESOPHAGOGASTRODUODENOSCOPY  04/2013   hospitalization @ [place], gastritis, gastric ulcers and duodenal ulcer with clean base ([place])  . HOLEP-LASER ENUCLEATION OF THE PROSTATE WITH MORCELLATION N/A 11/01/2016   Procedure: HOLEP-LASER ENUCLEATION OF THE PROSTATE WITH MORCELLATION;  Surgeon: Hollice Espy, MD;  Location: [facility] ORS;  Service: Urology;  Laterality: N/A;  . KIDNEY STONE SURGERY  83, 87, 90  . TOTAL KNEE ARTHROPLASTY Right 2006  . URETEROSCOPY Left 10/26/2016   Procedure: URETEROSCOPY;  Surgeon: Hollice Espy, MD;  Location: [facility] ORS;  Service: Urology;  Laterality: Left;  . URETEROSCOPY WITH HOLMIUM LASER LITHOTRIPSY Left 11/01/2016   Procedure: URETEROSCOPY WITH HOLMIUM LASER LITHOTRIPSY;  Surgeon: Hollice Espy, MD;  Location: [facility] ORS;  Service: Urology;  Laterality: Left;  . US ECHOCARDIOGRAPHY  12/2011   nl LV fxn, EF 60%, nl valves, dilated aortic root (35m)    There were no vitals filed for this visit.  Subjective Assessment - 02/01/19 1629    Subjective  Patient feels he can more accurately perceive his loudness when he turns down his hearing aid  ADULT SLP TREATMENT - 02/01/19 0001      General Information   Behavior/Cognition  Alert;Cooperative;Pleasant mood    HPI   83 year old man with Parkinson's disease       Treatment Provided   Treatment provided  Cognitive-Linquistic      Pain Assessment   Pain Assessment  No/denies pain      Cognitive-Linquistic Treatment   Treatment focused on  Voice    Skilled Treatment  Daily Task #1 (Maximum sustained "ah"): Average 11 seconds, 82 dB. Daily Task 2 (Maximum fundamental frequency range): Highs: 15 high pitched "ah" given max cues (280 Hz). Lows: 15 low pitched "ah" given max cues (115 Hz). Daily task #3 (Maximum speech loudness drill of functional phrases): Average 75  dB.  Hierarchal speech loudness drill:  Conversation, 70 dB.  Homework: assignments completed.  Off the cuff remarks: average 73 dB.        Assessment / Recommendations / Plan   Plan  Continue with current plan of care      Progression Toward Goals   Progression toward goals  Progressing toward goals       SLP Education - 02/01/19 1629    Education Details  LSVT-LOUD    Person(s) Educated  Patient    Methods  Explanation    Comprehension  Verbalized understanding         SLP Long Term Goals - 01/30/19 1555      SLP LONG TERM GOAL #1   Title  The patient will complete Daily Tasks (Maximum duration "ah", High/Lows, and Functional Phrases) at average loudness of 80 dB and with loud, good quality voice.    Status  Partially Met    Target Date  02/12/19      SLP LONG TERM GOAL #2   Title  The patient will complete Hierarchal Speech Loudness reading drills (words/phrases, sentences, and paragraph) at average 75 dB and with loud, good quality voice.    Status  Partially Met    Target Date  02/12/19      SLP LONG TERM GOAL #3   Title  The patient will participate in conversation, maintaining average loudness of 75 dB and loud, good quality voice.    Status  Partially Met    Target Date  02/12/19      SLP LONG TERM GOAL #4   Title  The patient will complete homework daily.    Status  On-going    Target Date  02/12/19       Plan - 02/01/19 1630    Clinical Impression Statement  The patient is completing daily tasks and hierarchal speech drill tasks with louder, better quality voice given cues.  He appears to be more comfortable with loud voice.    Speech Therapy Frequency  4x / week    Duration  4 weeks    Treatment/Interventions  SLP instruction and feedback;Patient/family education;Other (comment)    Potential to Achieve Goals  Good    Potential Considerations  Ability to learn/carryover information;Previous level of function;Co-morbidities;Severity of  impairments;Cooperation/participation level;Medical prognosis;Family/community support    SLP Home Exercise Plan  LSVT-LOUD daily homework    Consulted and Agree with Plan of Care  Patient       Patient will benefit from skilled therapeutic intervention in order to improve the following deficits and impairments:   Dysphonia    Problem List Patient Active Problem List   Diagnosis Date Noted  . Loss of weight 04/20/2013  . Greater trochanteric bursitis of  left hip 03/07/2013  . BPH (benign prostatic hypertrophy)   . HTN (hypertension)   . Diabetes type 2, uncontrolled ([place])   . Osteoarthritis   . Glaucoma   . HLD (hyperlipidemia)    Leroy Sea, MS/CCC- SLP  Lou Miner 02/01/2019, 4:31 PM  [place] MAIN [facility] SERVICES [address] [place], [place], [address] Phone: [phone]   Fax:  [phone]   Name: Bradley [name] MRN: [id number] Date of Birth: 1935-06-21

## 2019-02-01 NOTE — Therapy (Signed)
[place] MAIN [facility] SERVICES [address] [place], [place], [address] Phone: [phone]   Fax:  [phone]  Physical Therapy Treatment  Patient Details  Name: Bradley [name] MRN: [id number] Date of Birth: 1935/07/24 Referring Provider (PT): Dr. Manuella Ghazi   Encounter Date: 02/01/2019  PT End of Session - 02/01/19 1546    Visit Number  12    Number of Visits  17    Date for PT Re-Evaluation  02/21/19    Authorization Type  11/10 progress note    PT Start Time  1402    PT Stop Time  1458    PT Time Calculation (min)  56 min    Equipment Utilized During Treatment  Gait belt    Activity Tolerance  Patient tolerated treatment well    Behavior During Therapy  [facility] for tasks assessed/performed       Past Medical History:  Diagnosis Date  . Anemia   . BPH (benign prostatic hypertrophy)   . Diabetes type 2, controlled ([place])   . GERD (gastroesophageal reflux disease)    RARE  . Glaucoma    Syndor  . Hearing loss    bilateral hearing aids occasionally  . History of kidney stones   . HLD (hyperlipidemia)   . HTN (hypertension)   . Macular degeneration    Right ([place])  . Osteoarthritis    thumbs, hips, knees (s/p R TKR)  . PUD (peptic ulcer disease) 03/2013   2 antral gastric ulcers and 1 duodenal ulcer, NSAID related, admitted to [facility] with melena/anemia s/p EGD  . Sleep apnea    CPAP  . Tremor    intention    Past Surgical History:  Procedure Laterality Date  . [place]  2012  . carotid US  12/2011   no significant stenosis  . CATARACT EXTRACTION Bilateral 2011, 2012  . CHOLECYSTECTOMY  1997  . COLONOSCOPY  2009   per patient, told no longer needed  . CYSTOSCOPY W/ URETERAL STENT PLACEMENT Left 11/01/2016   Procedure: CYSTOSCOPY WITH STENT REPLACEMENT;  Surgeon: Hollice Espy, MD;  Location: [facility] OR[facility];  Service: Urology;  Laterality: Left;  . CYSTOSCOPY WITH STENT PLACEMENT Left 10/26/2016   Procedure:  CYSTOSCOPY WITH STENT PLACEMENT;  Surgeon: Hollice Espy, MD;  Location: [facility] OR[facility];  Service: Urology;  Laterality: Left;  . ESOPHAGOGASTRODUODENOSCOPY  04/2013   hospitalization @ [facility], gastritis, gastric ulcers and duodenal ulcer with clean base ([place])  . HOLEP-LASER ENUCLEATION OF THE PROSTATE WITH MORCELLATION N/A 11/01/2016   Procedure: HOLEP-LASER ENUCLEATION OF THE PROSTATE WITH MORCELLATION;  Surgeon: Hollice Espy, MD;  Location: [facility] ORS;  Service: Urology;  Laterality: N/A;  . KIDNEY STONE SURGERY  83, 87, 90  . TOTAL KNEE ARTHROPLASTY Right 2006  . URETEROSCOPY Left 10/26/2016   Procedure: URETEROSCOPY;  Surgeon: Hollice Espy, MD;  Location: [facility] ORS;  Service: Urology;  Laterality: Left;  . URETEROSCOPY WITH HOLMIUM LASER LITHOTRIPSY Left 11/01/2016   Procedure: URETEROSCOPY WITH HOLMIUM LASER LITHOTRIPSY;  Surgeon: Hollice Espy, MD;  Location: [facility] OR[facility];  Service: Urology;  Laterality: Left;  . US ECHOCARDIOGRAPHY  12/2011   nl LV fxn, EF 60%, nl valves, dilated aortic root (86m)    There were no vitals filed for this visit.  Subjective Assessment - 02/01/19 1542    Subjective  Patient states that he walked this morning and did well. Patient states he is feeling better and states that his hip did not hurt when he walked. Patient states his hip  hurt for about 5-10 minutes when he first got out of bed and that it has not hurt since then today.    Pertinent History  Patient reports he was diagnosed with Parkinson's Disease December 23, 2016. Patient reports he has not noticed any weakness on one side of his body compared to the other. Since the COVID epidemic, patient reports that he has become more physically active and has lost weight. Patient is gradually built up to now being able to walk 1 mile a day. Patient reports he was walking with a cane prior to [place] and now is ambulating without assistive device. Patient states he feels good. Patient states he lives in a retirement  community, [name], and was using the gym at first until [place]. Then, he started walking 3-4 days a week in April and towards the end of the month now he walks daily. Patient is on weight watchers program and has lost about 20 pounds. Patient reports that before he lost weight and got in better shape his Parkinson's medication was not working as well and he was told that they might have to increase his dosage, but patient states he saw his neurologist recently and they did not need to adjust his medications. Patient usually uses [facility] for community mobility at times. Patient reports he has the most difficulty with dexterity in his fingertips and with stamina/endurance. 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 Patient wears glasses. Patient reports he only has 50-60% vision due to macular degeneration. 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 Patient gets tremors in the evening and when fatigued left hand tremors more than right.  Patient reports his hand writing has declined since his diagnosis. Patient reports that the medication helps his symptoms and that fatigue worsens his symptoms of Parkinson's. Patient reports that he has difficult with bending and walking. Patient would like to work on improving his balance, increase his walking endurance, be better able to work on model trains and with his speech. Patient is also enrolled in the LSVT LOUD program.    Patient Stated Goals  to learn the BIG exercises    Currently in Pain?  No/denies    Pain Onset  In the past 7 days        Neuromuscular Re-education:   LSVT: Patient seen for LSVT Daily Session Maximal Daily Exercises for facilitation/coordination of movement Maximum Sustained Movements are designed to rescale the amplitude of movement output for generalization to daily functional activities. Performed as follows for 1 set of 10  repetitions each multi-directional sustained movements: 1) Floor to ceiling- patient able to perform independently 2) Side to side multidirectional-patient required a modeling cue for forearm supination but he was able to self-correct without verbal cue  Repetitive movements performed in standing and are designed to provide retraining effort needed for sustained muscle activation in tasks. Performed as follows for 1 set of 10 repetitions each of multi-directional repetitive movements: 3) Step and reach forward step- patient able to perform independently 4) Step and reach sideways step-  Required one cue for technique 5) Step and reach backwards step- required only one cue for technique this date and then was able to perform with good technique 6) Rock and reach forward/backward- Required one cue for foot placement 7) Rock and reach sideways- patient able to self-correct step size this date and remembered to shift  his weight Patient was able to look at the LSVT BIG packet and the instructions and written cues for the majority of exercises this date and then perform the exercises needing only a few cues for technique as indicated above.   Functional Component Task: 1.Sit to stand BIG functional component task with supervision 5 reps  2.Practiced BIG handwritingalternating betweenprintedand cursivefontthe word exercises-6 reps 3.Practiced picking up small beads and then threading them on an open paper clip. Performed first using right UE and then did L UE. 4.Performed 2 sets of 15 reps heel raises  5. Performed 2 sets of 15  reps toe raises with UEs support  Worked on [employer] balance beam, sidestepping L/R multiple reps and then tandem walking multiple reps of each type. Patient improved with practice tandem walking and required CGA.   BIG ambulation: Patient ambulated 1000' with BIG arm and leg movements while scanning for objects in the hallway. Patient had no episodes of toe drag in  swing phase.    PT Education - 02/01/19 1544    Education Details  Reviewed to perform the LSVT BIG exercises HEP packet Fri, Sat and Sun at home and reviewed the exercises    Person(s) Educated  Patient    Methods  Explanation;Verbal cues    Comprehension  Returned demonstration       PT Short Term Goals - 01/30/19 1522      PT SHORT TERM GOAL #1   Title  Patient will be able to perform home program independently for self-management.    Time  4    Period  Weeks    Status  Partially Met    Target Date  02/05/19        PT Long Term Goals - 01/30/19 1522      PT LONG TERM GOAL #1   Title  Patient will reduce falls risk as indicated by Merrilee Jansky Balance Scale Score of > 50/56 to indicate decreased falls risk.    Baseline  scored 48/56 on 01/08/19    Time  8    Period  Weeks    Status  On-going      PT LONG TERM GOAL #2   Title  Patient will complete five times sit to stand test in 13 seconds or less indicating an increased LE strength and improved balance.    Baseline  scored 16 seconds without use of UEs on 01/08/2019    Time  8    Period  Weeks    Status  On-going      PT LONG TERM GOAL #3   Title  Patient will report 50% improvements in his finger dexterity as evidenced by improved hand writing ability and improved ability to work on his model trains    Time  8    Period  Weeks    Status  On-going            Plan - 02/01/19 1546    Clinical Impression Statement  Patient continues to gain independence wtih the LSVT BIG exercises and required significantly less cues this date. Patient improved with practice sidestepping and tandem walking on [employer] balance beam this date. Patient would benefit from continued PT services to address goals and functional deficits.    Personal Factors and Comorbidities  Comorbidity 3+;Age    Comorbidities  HTN, DM, OA, glaucoma, Parkinson's Disease, patient reports significan visual impairments due to macular degeneration     Examination-Participation Restrictions  Other   Hobby train building   Stability/Clinical  Decision Making  Evolving/Moderate complexity    Rehab Potential  Good    PT Frequency  4x / week    PT Duration  4 weeks    PT Treatment/Interventions  ADLs/Self Care Home Management;Stair training;Gait training;Neuromuscular re-education;Therapeutic activities;Therapeutic exercise;Balance training;Patient/family education    Consulted and Agree with Plan of Care  Patient       Patient will benefit from skilled therapeutic intervention in order to improve the following deficits and impairments:  Decreased balance, Decreased endurance, Decreased strength, Decreased mobility, Difficulty walking, Impaired vision/preception  Visit Diagnosis: Difficulty in walking, not elsewhere classified  Muscle weakness (generalized)     Problem List Patient Active Problem List   Diagnosis Date Noted  . Loss of weight 04/20/2013  . Greater trochanteric bursitis of left hip 03/07/2013  . BPH (benign prostatic hypertrophy)   . HTN (hypertension)   . Diabetes type 2, uncontrolled ([place])   . Osteoarthritis   . Glaucoma   . HLD (hyperlipidemia)    Lady Deutscher PT, DPT [phone] Lady Deutscher 02/01/2019, 3:57 PM  [place] MAIN [facility] SERVICES [address] [place], [place], [address] Phone: [phone]   Fax:  [phone]  Name: Bradley [name] MRN: [id number] Date of Birth: Oct 20, 1935

## 2019-02-05 ENCOUNTER — Other Ambulatory Visit: Payer: Self-pay

## 2019-02-05 ENCOUNTER — Encounter: Payer: Self-pay | Admitting: Physical Therapy

## 2019-02-05 ENCOUNTER — Ambulatory Visit: Payer: Medicare Other | Admitting: Physical Therapy

## 2019-02-05 ENCOUNTER — Ambulatory Visit: Payer: Medicare Other | Admitting: Speech Pathology

## 2019-02-05 DIAGNOSIS — R262 Difficulty in walking, not elsewhere classified: Secondary | ICD-10-CM

## 2019-02-05 DIAGNOSIS — R490 Dysphonia: Secondary | ICD-10-CM

## 2019-02-05 DIAGNOSIS — M6281 Muscle weakness (generalized): Secondary | ICD-10-CM

## 2019-02-05 NOTE — Therapy (Signed)
[place] MAIN [facility] SERVICES [address] [place], [place], [address] Phone: [phone]   Fax:  [phone]  Physical Therapy Treatment  Patient Details  Name: Bradley [name] MRN: [id number] Date of Birth: 02-27-1936 Referring Provider (PT): Dr. Manuella Ghazi   Encounter Date: 02/05/2019  PT End of Session - 02/05/19 1502    Visit Number  13    Number of Visits  17    Date for PT Re-Evaluation  02/21/19    Authorization Type  13/10 progress note    PT Start Time  0240    PT Stop Time  1453    PT Time Calculation (min)  54 min    Equipment Utilized During Treatment  Gait belt    Activity Tolerance  Patient tolerated treatment well    Behavior During Therapy  [facility] for tasks assessed/performed       Past Medical History:  Diagnosis Date  . Anemia   . BPH (benign prostatic hypertrophy)   . Diabetes type 2, controlled ([place])   . GERD (gastroesophageal reflux disease)    RARE  . Glaucoma    Syn[place]  . Hearing loss    bilateral hearing aids occasionally  . History of kidney stones   . HLD (hyperlipidemia)   . HTN (hypertension)   . Macular degeneration    Right ([place])  . Osteoarthritis    thumbs, hips, knees (s/p R TKR)  . PUD (peptic ulcer disease) 03/2013   2 antral gastric ulcers and 1 duodenal ulcer, NSAID related, admitted to [facility] with melena/anemia s/p EGD  . Sleep apnea    CPAP  . Tremor    intention    Past Surgical History:  Procedure Laterality Date  . [place]  2012  . carotid US  12/2011   no significant stenosis  . CATARACT EXTRACTION Bilateral 2011, 2012  . CHOLECYSTECTOMY  1997  . COLONOSCOPY  2009   per patient, told no longer needed  . CYSTOSCOPY W/ URETERAL STENT PLACEMENT Left 11/01/2016   Procedure: CYSTOSCOPY WITH STENT REPLACEMENT;  Surgeon: Hollice Espy, MD;  Location: [facility] OR[facility];  Service: Urology;  Laterality: Left;  . CYSTOSCOPY WITH STENT PLACEMENT Left 10/26/2016   Procedure:  CYSTOSCOPY WITH STENT PLACEMENT;  Surgeon: Hollice Espy, MD;  Location: [facility] OR[facility];  Service: Urology;  Laterality: Left;  . ESOPHAGOGASTRODUODENOSCOPY  04/2013   hospitalization @ [place], gastritis, gastric ulcers and duodenal ulcer with clean base ([place])  . HOLEP-LASER ENUCLEATION OF THE PROSTATE WITH MORCELLATION N/A 11/01/2016   Procedure: HOLEP-LASER ENUCLEATION OF THE PROSTATE WITH MORCELLATION;  Surgeon: Hollice Espy, MD;  Location: [facility] ORS;  Service: Urology;  Laterality: N/A;  . KIDNEY STONE SURGERY  83, 87, 90  . TOTAL KNEE ARTHROPLASTY Right 2006  . URETEROSCOPY Left 10/26/2016   Procedure: URETEROSCOPY;  Surgeon: Hollice Espy, MD;  Location: [facility] ORS;  Service: Urology;  Laterality: Left;  . URETEROSCOPY WITH HOLMIUM LASER LITHOTRIPSY Left 11/01/2016   Procedure: URETEROSCOPY WITH HOLMIUM LASER LITHOTRIPSY;  Surgeon: Hollice Espy, MD;  Location: [facility] ORS;  Service: Urology;  Laterality: Left;  . US ECHOCARDIOGRAPHY  12/2011   nl LV fxn, EF 60%, nl valves, dilated aortic root (30m)    There were no vitals filed for this visit.  Subjective Assessment - 02/05/19 1356    Subjective  Patient reports he walked every day except Saturday because of the rain. Patient states he did part of his exercises over the weekend and that he did the heel and toe  raises both days.    Pertinent History  Patient reports he was diagnosed with Parkinson's Disease December 23, 2016. Patient reports he has not noticed any weakness on one side of his body compared to the other. Since the COVID epidemic, patient reports that he has become more physically active and has lost weight. Patient is gradually built up to now being able to walk 1 mile a day. Patient reports he was walking with a cane prior to [place] and now is ambulating without assistive device. Patient states he feels good. Patient states he lives in a retirement community, [name], and was using the gym at first until [place]. Then, he started  walking 3-4 days a week in April and towards the end of the month now he walks daily. Patient is on weight watchers program and has lost about 20 pounds. Patient reports that before he lost weight and got in better shape his Parkinson's medication was not working as well and he was told that they might have to increase his dosage, but patient states he saw his neurologist recently and they did not need to adjust his medications. Patient usually uses [facility] for community mobility at times. Patient reports he has the most difficulty with dexterity in his fingertips and with stamina/endurance. 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 Patient wears glasses. Patient reports he only has 50-60% vision due to macular degeneration. 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 Patient gets tremors in the evening and when fatigued left hand tremors more than right.  Patient reports his hand writing has declined since his diagnosis. Patient reports that the medication helps his symptoms and that fatigue worsens his symptoms of Parkinson's. Patient reports that he has difficult with bending and walking. Patient would like to work on improving his balance, increase his walking endurance, be better able to work on model trains and with his speech. Patient is also enrolled in the LSVT LOUD program.    Patient Stated Goals  to learn the BIG exercises    Pain Onset  In the past 7 days          LSVT: Patient seen for LSVT Daily Session Maximal Daily Exercises for facilitation/coordination of movement Maximum Sustained Movements are designed to rescale the amplitude of movement output for generalization to daily functional activities. Performed as follows for 1 set of 10 repetitions each multi-directional sustained movements: 1) Floor to ceiling  2) Side to side multidirectional   Repetitive movements performed in  standing and are designed to provide retraining effort needed for sustained muscle activation in tasks. Performed as follows for 1 set of 10 repetitions each of multi-directional repetitive movements: 3) Step and reach forward step- 4) Step and reach sideways step-  Cue to step sideways not forward and then patient able to perform without further cuing 5) Step and reach backwards step- required modeling cue to extend arms and flex trunk 6) Rock and reach forward/backward- cue to keep feet planted once patient has taken a step and to shift the hips instead and then patient was able to perform without further cues. 7) Rock and reach sideways- one cue for technique  Utilized 1# wrist weights for the above exercises 1-7 this date. Patient was able to maintain the correct arm position for the exercises even with having the weights on and with the sustained movement exercises.  Functional Component Task: 1.Sit to stand BIG functional component task with supervision 5 reps 2.Practiced BIG handwritingalternating betweenprintedand cursivefontthe word exercises-6reps 3.Practicedpicking upsmall beads and then threading them on an open paper clip.Performed first using right UE and then did L UE. 4.Performed6 reps of left/right sidestepping on 2" x 4" board with CGA 5. Performed6 reps of 15-20 second holds of tandem stance foot placement with alternating lead leg while statically standing on 2" X 4" board with CGA. Patient challenged by this activity and reached to touch the // bar a few times but was largely able to self-correct using ankle strategies.   BIG ambulation: Patient ambulated1000' with BIG arm and leg movements while holding a conversation. Patient had no episodes of toe drag in swing phase.     PT Short Term Goals - 01/30/19 1522      PT SHORT TERM GOAL #1   Title  Patient will be able to perform home program independently for self-management.    Time  4    Period   Weeks    Status  Partially Met    Target Date  02/05/19        PT Long Term Goals - 01/30/19 1522      PT LONG TERM GOAL #1   Title  Patient will reduce falls risk as indicated by Merrilee Jansky Balance Scale Score of > 50/56 to indicate decreased falls risk.    Baseline  scored 48/56 on 01/08/19    Time  8    Period  Weeks    Status  On-going      PT LONG TERM GOAL #2   Title  Patient will complete five times sit to stand test in 13 seconds or less indicating an increased LE strength and improved balance.    Baseline  scored 16 seconds without use of UEs on 01/08/2019    Time  8    Period  Weeks    Status  On-going      PT LONG TERM GOAL #3   Title  Patient will report 50% improvements in his finger dexterity as evidenced by improved hand writing ability and improved ability to work on his model trains    Time  8    Period  Weeks    Status  On-going            Plan - 02/05/19 1503    Clinical Impression Statement  Patient demonstrating improvements in increased trunk rotation and arm flexion with the LSVT exercises. Patient is consistently supinating his forearms and doing finger ABD during the exercises that these movements are applicable. Patient required only a few cues for some of the higher level exercises this date. Patient was able to do the LSVT BIG exercises with a 1 pound wrist weight this date even for the sustained movement exercises and he was able to maintain his arm position correctly throughout. Patient would benefit from continued PT services to further address goals.    Personal Factors and Comorbidities  Comorbidity 3+;Age    Comorbidities  HTN, DM, OA, glaucoma, Parkinson's Disease, patient reports significan visual impairments due to macular degeneration    Examination-Participation Restrictions  Other   Hobby train building   Stability/Clinical Decision Making  Evolving/Moderate complexity    Rehab Potential  Good    PT Frequency  4x / week    PT Duration  4  weeks    PT Treatment/Interventions  ADLs/Self Care Home Management;Stair training;Gait training;Neuromuscular re-education;Therapeutic activities;Therapeutic exercise;Balance training;Patient/family education  Consulted and Agree with Plan of Care  Patient       Patient will benefit from skilled therapeutic intervention in order to improve the following deficits and impairments:  Decreased balance, Decreased endurance, Decreased strength, Decreased mobility, Difficulty walking, Impaired vision/preception  Visit Diagnosis: Difficulty in walking, not elsewhere classified  Muscle weakness (generalized)     Problem List Patient Active Problem List   Diagnosis Date Noted  . Loss of weight 04/20/2013  . Greater trochanteric bursitis of left hip 03/07/2013  . BPH (benign prostatic hypertrophy)   . HTN (hypertension)   . Diabetes type 2, uncontrolled ([place])   . Osteoarthritis   . Glaucoma   . HLD (hyperlipidemia)    Lady Deutscher PT, DPT [phone] Lady Deutscher 02/05/2019, 3:28 PM  [place] MAIN [facility] SERVICES [address] [place], [place], [address] Phone: [phone]   Fax:  [phone]  Name: Shepard Keltz MRN: [id number] Date of Birth: [birth year]

## 2019-02-06 ENCOUNTER — Encounter: Payer: Self-pay | Admitting: Speech Pathology

## 2019-02-06 ENCOUNTER — Ambulatory Visit: Payer: Medicare Other | Admitting: Physical Therapy

## 2019-02-06 ENCOUNTER — Ambulatory Visit: Payer: Medicare Other | Admitting: Speech Pathology

## 2019-02-06 ENCOUNTER — Other Ambulatory Visit: Payer: Self-pay

## 2019-02-06 ENCOUNTER — Encounter: Payer: Self-pay | Admitting: Physical Therapy

## 2019-02-06 DIAGNOSIS — R490 Dysphonia: Secondary | ICD-10-CM | POA: Diagnosis not present

## 2019-02-06 DIAGNOSIS — M6281 Muscle weakness (generalized): Secondary | ICD-10-CM

## 2019-02-06 DIAGNOSIS — R262 Difficulty in walking, not elsewhere classified: Secondary | ICD-10-CM

## 2019-02-06 NOTE — Therapy (Signed)
[place] MAIN [facility] SERVICES [address] [place], [place], [address] Phone: [phone]   Fax:  [phone]  Physical Therapy Treatment  Patient Details  Name: Bradley [name] MRN: [id number] Date of Birth: 1936/02/17 Referring Provider (PT): Dr. Manuella Ghazi   Encounter Date: 02/06/2019  PT End of Session - 02/06/19 1401    Visit Number  14    Number of Visits  17    Date for PT Re-Evaluation  02/21/19    Authorization Type  14/10 progress note    PT Start Time  1357    PT Stop Time  1458    PT Time Calculation (min)  61 min    Equipment Utilized During Treatment  Gait belt    Activity Tolerance  Patient tolerated treatment well    Behavior During Therapy  [facility] for tasks assessed/performed       Past Medical History:  Diagnosis Date  . Anemia   . BPH (benign prostatic hypertrophy)   . Diabetes type 2, controlled ([place])   . GERD (gastroesophageal reflux disease)    RARE  . Glaucoma    Syndor  . Hearing loss    bilateral hearing aids occasionally  . History of kidney stones   . HLD (hyperlipidemia)   . HTN (hypertension)   . Macular degeneration    Right ([place])  . Osteoarthritis    thumbs, hips, knees (s/p R TKR)  . PUD (peptic ulcer disease) 03/2013   2 antral gastric ulcers and 1 duodenal ulcer, NSAID related, admitted to [facility] with melena/anemia s/p EGD  . Sleep apnea    CPAP  . Tremor    intention    Past Surgical History:  Procedure Laterality Date  . [place]  2012  . carotid US  12/2011   no significant stenosis  . CATARACT EXTRACTION Bilateral 2011, 2012  . CHOLECYSTECTOMY  1997  . COLONOSCOPY  2009   per patient, told no longer needed  . CYSTOSCOPY W/ URETERAL STENT PLACEMENT Left 11/01/2016   Procedure: CYSTOSCOPY WITH STENT REPLACEMENT;  Surgeon: Hollice Espy, MD;  Location: [facility];  Service: Urology;  Laterality: Left;  . CYSTOSCOPY WITH STENT PLACEMENT Left 10/26/2016   Procedure:  CYSTOSCOPY WITH STENT PLACEMENT;  Surgeon: Hollice Espy, MD;  Location: [facility];  Service: Urology;  Laterality: Left;  . ESOPHAGOGASTRODUODENOSCOPY  04/2013   hospitalization @ [place], gastritis, gastric ulcers and duodenal ulcer with clean base ([place])  . HOLEP-LASER ENUCLEATION OF THE PROSTATE WITH MORCELLATION N/A 11/01/2016   Procedure: HOLEP-LASER ENUCLEATION OF THE PROSTATE WITH MORCELLATION;  Surgeon: Hollice Espy, MD;  Location: [facility];  Service: Urology;  Laterality: N/A;  . KIDNEY STONE SURGERY  83, 87, 90  . TOTAL KNEE ARTHROPLASTY Right 2006  . URETEROSCOPY Left 10/26/2016   Procedure: URETEROSCOPY;  Surgeon: Hollice Espy, MD;  Location: [facility];  Service: Urology;  Laterality: Left;  . URETEROSCOPY WITH HOLMIUM LASER LITHOTRIPSY Left 11/01/2016   Procedure: URETEROSCOPY WITH HOLMIUM LASER LITHOTRIPSY;  Surgeon: Hollice Espy, MD;  Location: [facility];  Service: Urology;  Laterality: Left;  . US ECHOCARDIOGRAPHY  12/2011   nl LV fxn, EF 60%, nl valves, dilated aortic root (30m)    There were no vitals filed for this visit.  Subjective Assessment - 02/06/19 1401    Subjective  Patient reports that when he first started the LSVT program that he does not think that he could have attempted heel toe walking. Patient feels he has made improvements in his  balance.    Pertinent History  Patient reports he was diagnosed with Parkinson's Disease December 23, 2016. Patient reports he has not noticed any weakness on one side of his body compared to the other. Since the COVID epidemic, patient reports that he has become more physically active and has lost weight. Patient is gradually built up to now being able to walk 1 mile a day. Patient reports he was walking with a cane prior to [place] and now is ambulating without assistive device. Patient states he feels good. Patient states he lives in a retirement community, [name], and was using the gym at first until [place]. Then, he started  walking 3-4 days a week in April and towards the end of the month now he walks daily. Patient is on weight watchers program and has lost about 20 pounds. Patient reports that before he lost weight and got in better shape his Parkinson's medication was not working as well and he was told that they might have to increase his dosage, but patient states he saw his neurologist recently and they did not need to adjust his medications. Patient usually uses [facility] for community mobility at times. Patient reports he has the most difficulty with dexterity in his fingertips and with stamina/endurance. 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 Patient wears glasses. Patient reports he only has 50-60% vision due to macular degeneration. 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 Patient gets tremors in the evening and when fatigued left hand tremors more than right.  Patient reports his hand writing has declined since his diagnosis. Patient reports that the medication helps his symptoms and that fatigue worsens his symptoms of Parkinson's. Patient reports that he has difficult with bending and walking. Patient would like to work on improving his balance, increase his walking endurance, be better able to work on model trains and with his speech. Patient is also enrolled in the LSVT LOUD program.    Patient Stated Goals  to learn the BIG exercises    Pain Onset  In the past 7 days         LSVT: Patient seen for LSVT Daily Session Maximal Daily Exercises for facilitation/coordination of movement Maximum Sustained Movements are designed to rescale the amplitude of movement output for generalization to daily functional activities. Performed as follows for 1 set of 10 repetitions each multi-directional sustained movements: 1) Floor to ceiling- patient performed independently using home exercise packet pictures/comments 2)  Side to side multidirectional-patient performed independently using home exercise packet pictures/comments   Repetitive movements performed in standing and are designed to provide retraining effort needed for sustained muscle activation in tasks. Performed as follows for 1 set of 10 repetitions each of multi-directional repetitive movements: 3) Step and reach forward step-patient performed independently using home exercise packet pictures/comments 4) Step and reach sideways step- patient required cue to not twist at the waist as initially patient was trying to perform the rock and reach sideways for this exercise and one modeling cue to bring both arms up not just one.  5) Step and reach backwards step-one cue to step backward not forwards and then patient able to perform without further cuing 6) Rock and reach forward/backward- patient able to perform this independently this date using home exercise packet pictures/comments 7) Rock and reach sideways- patient did well performing rock and reach sideways to the  left but need a cue for the right to do trunk rotation.  Functional Component Task: 1.Sit to stand BIG functional component task with supervision 5 reps 2.Practiced BIG handwritingalternating betweenprintedand cursivefontthe word exercises-6reps 3.Practicedpicking upsmall beads and thenthreading them on an open paper clip.Performed first using right UE and then did L UE. 4. Performed on Airex pad 2 sets of 15 reps heel raises and on firm surface 2 sets of 15 reps toe raises.  5. Performedtandem walking on firm surface with CGA. Patient had several losses of balance that patient was able to use hip and ankle or stepping strategies to self-correct with CGA.  Performedstanding on Airex pad and then doing alternating single step forward onto green oval foam disc.  1/2 Foam Roll:  Worked on 1/2 foam roll flat side down static holds with and without upper arm reaching. Worked  on 1/2 foam roll flat side down with side-stepping L/R multiple reps.   BIG ambulation: Patient ambulated1000' with BIG arm and leg movements whileholding a conversation. Patient had no episodes of toe drag in swing phase.     PT Education - 02/06/19 1751    Education Details  Reviewed LSVT BIG exercises    Person(s) Educated  Patient    Methods  Explanation    Comprehension  Verbalized understanding       PT Short Term Goals - 01/30/19 1522      PT SHORT TERM GOAL #1   Title  Patient will be able to perform home program independently for self-management.    Time  4    Period  Weeks    Status  Partially Met    Target Date  02/05/19        PT Long Term Goals - 01/30/19 1522      PT LONG TERM GOAL #1   Title  Patient will reduce falls risk as indicated by Merrilee Jansky Balance Scale Score of > 50/56 to indicate decreased falls risk.    Baseline  scored 48/56 on 01/08/19    Time  8    Period  Weeks    Status  On-going      PT LONG TERM GOAL #2   Title  Patient will complete five times sit to stand test in 13 seconds or less indicating an increased LE strength and improved balance.    Baseline  scored 16 seconds without use of UEs on 01/08/2019    Time  8    Period  Weeks    Status  On-going      PT LONG TERM GOAL #3   Title  Patient will report 50% improvements in his finger dexterity as evidenced by improved hand writing ability and improved ability to work on his model trains    Time  [place] - 02/06/19 1753    Clinical Impression Statement  Patient continues to improve with being better able to perform the corret technique with the LSVT BIG exercises and to perform the exercises with decreased cuing. Patient only required 3 cues this date during the standard exercises. Patient continues to progress with standing high level balance exercises.    Personal Factors and Comorbidities  Comorbidity 3+;Age    Comorbidities   HTN, DM, OA, glaucoma, Parkinson's Disease, patient reports significan visual impairments due to macular degeneration    Examination-Participation Restrictions  Other   Hobby train building  Stability/Clinical Decision Making  Evolving/Moderate complexity    Rehab Potential  Good    PT Frequency  4x / week    PT Duration  4 weeks    PT Treatment/Interventions  ADLs/Self Care Home Management;Stair training;Gait training;Neuromuscular re-education;Therapeutic activities;Therapeutic exercise;Balance training;Patient/family education    Consulted and Agree with Plan of Care  Patient       Patient will benefit from skilled therapeutic intervention in order to improve the following deficits and impairments:  Decreased balance, Decreased endurance, Decreased strength, Decreased mobility, Difficulty walking, Impaired vision/preception  Visit Diagnosis: Difficulty in walking, not elsewhere classified  Muscle weakness (generalized)     Problem List Patient Active Problem List   Diagnosis Date Noted  . Loss of weight 04/20/2013  . Greater trochanteric bursitis of left hip 03/07/2013  . BPH (benign prostatic hypertrophy)   . HTN (hypertension)   . Diabetes type 2, uncontrolled ([place])   . Osteoarthritis   . Glaucoma   . HLD (hyperlipidemia)    Lady Deutscher PT, DPT [phone] Lady Deutscher 02/06/2019, 6:18 PM  [place] MAIN [facility] SERVICES [address] [place], [place], [address] Phone: [phone]   Fax:  [phone]  Name: Bradley [name] MRN: [id number] Date of Birth: [birth year]

## 2019-02-06 NOTE — Therapy (Signed)
[place] MAIN [facility] SERVICES [address] [place], [place], [address] Phone: [phone]   Fax:  [phone]  Speech Language Pathology Treatment  Patient Details  Name: Bradley [name] MRN: [id number] Date of Birth: 1935-12-23 Referring Provider (SLP): Dr. Manuella Ghazi   Encounter Date: 02/06/2019  End of Session - 02/06/19 1606    Number of Visits  17    Date for SLP Re-Evaluation  02/12/19    Authorization Type  Medicare    Authorization Time Period  Start 01/31/2019    Authorization - Number of Visits  10    SLP Start Time  1500    SLP Stop Time   1550    SLP Time Calculation (min)  50 min    Activity Tolerance  Patient tolerated treatment well       Past Medical History:  Diagnosis Date  . Anemia   . BPH (benign prostatic hypertrophy)   . Diabetes type 2, controlled ([place])   . GERD (gastroesophageal reflux disease)    RARE  . Glaucoma    Syndor  . Hearing loss    bilateral hearing aids occasionally  . History of kidney stones   . HLD (hyperlipidemia)   . HTN (hypertension)   . Macular degeneration    Right ([place])  . Osteoarthritis    thumbs, hips, knees (s/p R TKR)  . PUD (peptic ulcer disease) 03/2013   2 antral gastric ulcers and 1 duodenal ulcer, NSAID related, admitted to [facility] with melena/anemia s/p EGD  . Sleep apnea    CPAP  . Tremor    intention    Past Surgical History:  Procedure Laterality Date  . [place]  2012  . carotid US  12/2011   no significant stenosis  . CATARACT EXTRACTION Bilateral 2011, 2012  . CHOLECYSTECTOMY  1997  . COLONOSCOPY  2009   per patient, told no longer needed  . CYSTOSCOPY W/ URETERAL STENT PLACEMENT Left 11/01/2016   Procedure: CYSTOSCOPY WITH STENT REPLACEMENT;  Surgeon: Hollice Espy, MD;  Location: [facility];  Service: Urology;  Laterality: Left;  . CYSTOSCOPY WITH STENT PLACEMENT Left 10/26/2016   Procedure: CYSTOSCOPY WITH STENT PLACEMENT;  Surgeon: Hollice Espy, MD;  Location: [facility];  Service: Urology;  Laterality: Left;  . ESOPHAGOGASTRODUODENOSCOPY  04/2013   hospitalization @ [place], gastritis, gastric ulcers and duodenal ulcer with clean base ([place])  . HOLEP-LASER ENUCLEATION OF THE PROSTATE WITH MORCELLATION N/A 11/01/2016   Procedure: HOLEP-LASER ENUCLEATION OF THE PROSTATE WITH MORCELLATION;  Surgeon: Hollice Espy, MD;  Location: [facility];  Service: Urology;  Laterality: N/A;  . KIDNEY STONE SURGERY  83, 87, 90  . TOTAL KNEE ARTHROPLASTY Right 2006  . URETEROSCOPY Left 10/26/2016   Procedure: URETEROSCOPY;  Surgeon: Hollice Espy, MD;  Location: [facility];  Service: Urology;  Laterality: Left;  . URETEROSCOPY WITH HOLMIUM LASER LITHOTRIPSY Left 11/01/2016   Procedure: URETEROSCOPY WITH HOLMIUM LASER LITHOTRIPSY;  Surgeon: Hollice Espy, MD;  Location: [facility];  Service: Urology;  Laterality: Left;  . US ECHOCARDIOGRAPHY  12/2011   nl LV fxn, EF 60%, nl valves, dilated aortic root (58m)    There were no vitals filed for this visit.  Subjective Assessment - 02/06/19 1606    Subjective  Patient feels that he is making good progress in LOUD and BIG            ADULT SLP TREATMENT - 02/06/19 1605      General Information   Behavior/Cognition  Alert;Cooperative;Pleasant  mood    HPI   83 year old man with Parkinson's disease       Treatment Provided   Treatment provided  Cognitive-Linquistic      Pain Assessment   Pain Assessment  No/denies pain      Cognitive-Linquistic Treatment   Treatment focused on  Voice    Skilled Treatment  Daily Task #1 (Maximum sustained "ah"): Average 13 seconds, 84 dB. Daily Task 2 (Maximum fundamental frequency range): Highs: 15 high pitched "ah" given max cues (305 Hz). Lows: 15 low pitched "ah" given max cues (100 Hz). Daily task #3 (Maximum speech loudness drill of functional phrases): Average 75 dB.  Hierarchal speech loudness drill:  Conversation, 70 dB.  Homework: assignments completed.   Off the cuff remarks: average 72 dB.        Assessment / Recommendations / Plan   Plan  Continue with current plan of care      Progression Toward Goals   Progression toward goals  Progressing toward goals       SLP Education - 02/06/19 1606    Education Details  LSVT-LOUD    Person(s) Educated  Patient    Methods  Explanation    Comprehension  Verbalized understanding         SLP Long Term Goals - 01/30/19 1555      SLP LONG TERM GOAL #1   Title  The patient will complete Daily Tasks (Maximum duration "ah", High/Lows, and Functional Phrases) at average loudness of 80 dB and with loud, good quality voice.    Status  Partially Met    Target Date  02/12/19      SLP LONG TERM GOAL #2   Title  The patient will complete Hierarchal Speech Loudness reading drills (words/phrases, sentences, and paragraph) at average 75 dB and with loud, good quality voice.    Status  Partially Met    Target Date  02/12/19      SLP LONG TERM GOAL #3   Title  The patient will participate in conversation, maintaining average loudness of 75 dB and loud, good quality voice.    Status  Partially Met    Target Date  02/12/19      SLP LONG TERM GOAL #4   Title  The patient will complete homework daily.    Status  On-going    Target Date  02/12/19       Plan - 02/06/19 1607    Clinical Impression Statement  The patient is completing daily tasks and hierarchal speech drill tasks with louder, better quality voice given cues.  He appears to be more comfortable with loud voice.    Speech Therapy Frequency  4x / week    Duration  4 weeks    Treatment/Interventions  SLP instruction and feedback;Patient/family education;Other (comment)    Potential to Achieve Goals  Good    SLP Home Exercise Plan  LSVT-LOUD daily homework    Consulted and Agree with Plan of Care  Patient       Patient will benefit from skilled therapeutic intervention in order to improve the following deficits and impairments:    Dysphonia    Problem List Patient Active Problem List   Diagnosis Date Noted  . Loss of weight 04/20/2013  . Greater trochanteric bursitis of left hip 03/07/2013  . BPH (benign prostatic hypertrophy)   . HTN (hypertension)   . Diabetes type 2, uncontrolled ([place])   . Osteoarthritis   . Glaucoma   . HLD (hyperlipidemia)  Leroy Sea, MS/CCC- SLP  Lou Miner 02/06/2019, 4:08 PM  [place] MAIN [facility] SERVICES [address] [place], [place], [address] Phone: [phone]   Fax:  [phone]   Name: Drevion Offord MRN: [id number] Date of Birth: Sep 22, 1935

## 2019-02-06 NOTE — Therapy (Signed)
[place] MAIN [facility] SERVICES [address] [place], [place], [address] Phone: [phone]   Fax:  [phone]  Speech Language Pathology Treatment  Patient Details  Name: Bradley [name] MRN: [id number] Date of Birth: May 17, 1936 Referring Provider (SLP): Dr. Manuella Ghazi   Encounter Date: 02/05/2019  End of Session - 02/06/19 0914    Visit Number  13    Number of Visits  17    Date for SLP Re-Evaluation  02/12/19    Authorization Type  Medicare    Authorization Time Period  Start 01/31/2019    Authorization - Visit Number  3    Authorization - Number of Visits  10    SLP Start Time  1500    SLP Stop Time   1550    SLP Time Calculation (min)  50 min    Activity Tolerance  Patient tolerated treatment well       Past Medical History:  Diagnosis Date  . Anemia   . BPH (benign prostatic hypertrophy)   . Diabetes type 2, controlled ([place])   . GERD (gastroesophageal reflux disease)    RARE  . Glaucoma    Syndor  . Hearing loss    bilateral hearing aids occasionally  . History of kidney stones   . HLD (hyperlipidemia)   . HTN (hypertension)   . Macular degeneration    Right ([place])  . Osteoarthritis    thumbs, hips, knees (s/p R TKR)  . PUD (peptic ulcer disease) 03/2013   2 antral gastric ulcers and 1 duodenal ulcer, NSAID related, admitted to [facility] with melena/anemia s/p EGD  . Sleep apnea    CPAP  . Tremor    intention    Past Surgical History:  Procedure Laterality Date  . [place]  2012  . carotid US  12/2011   no significant stenosis  . CATARACT EXTRACTION Bilateral 2011, 2012  . CHOLECYSTECTOMY  1997  . COLONOSCOPY  2009   per patient, told no longer needed  . CYSTOSCOPY W/ URETERAL STENT PLACEMENT Left 11/01/2016   Procedure: CYSTOSCOPY WITH STENT REPLACEMENT;  Surgeon: Hollice Espy, MD;  Location: [facility];  Service: Urology;  Laterality: Left;  . CYSTOSCOPY WITH STENT PLACEMENT Left 10/26/2016   Procedure: CYSTOSCOPY WITH STENT PLACEMENT;  Surgeon: Hollice Espy, MD;  Location: [facility];  Service: Urology;  Laterality: Left;  . ESOPHAGOGASTRODUODENOSCOPY  04/2013   hospitalization @ [place], gastritis, gastric ulcers and duodenal ulcer with clean base ([place])  . HOLEP-LASER ENUCLEATION OF THE PROSTATE WITH MORCELLATION N/A 11/01/2016   Procedure: HOLEP-LASER ENUCLEATION OF THE PROSTATE WITH MORCELLATION;  Surgeon: Hollice Espy, MD;  Location: [facility];  Service: Urology;  Laterality: N/A;  . KIDNEY STONE SURGERY  83, 87, 90  . TOTAL KNEE ARTHROPLASTY Right 2006  . URETEROSCOPY Left 10/26/2016   Procedure: URETEROSCOPY;  Surgeon: Hollice Espy, MD;  Location: [facility];  Service: Urology;  Laterality: Left;  . URETEROSCOPY WITH HOLMIUM LASER LITHOTRIPSY Left 11/01/2016   Procedure: URETEROSCOPY WITH HOLMIUM LASER LITHOTRIPSY;  Surgeon: Hollice Espy, MD;  Location: [facility];  Service: Urology;  Laterality: Left;  . US ECHOCARDIOGRAPHY  12/2011   nl LV fxn, EF 60%, nl valves, dilated aortic root (78m)    There were no vitals filed for this visit.  Subjective Assessment - 02/06/19 0913    Subjective  Patient reports inconsistency in completing home exercises            ADULT SLP TREATMENT - 02/06/19 0001  General Information   Behavior/Cognition  Alert;Cooperative;Pleasant mood    HPI   83 year old man with Parkinson's disease       Treatment Provided   Treatment provided  Cognitive-Linquistic      Pain Assessment   Pain Assessment  No/denies pain      Cognitive-Linquistic Treatment   Treatment focused on  Voice    Skilled Treatment  Daily Task #1 (Maximum sustained "ah"): Average 11 seconds, 82 dB. Daily Task 2 (Maximum fundamental frequency range): Highs: 15 high pitched "ah" given max cues (280 Hz). Lows: 15 low pitched "ah" given max cues (115 Hz). Daily task #3 (Maximum speech loudness drill of functional phrases): Average 75 dB.  Hierarchal speech loudness  drill:  Conversation, 70 dB.  Homework: assignments completed.  Off the cuff remarks: average 73 dB.        Assessment / Recommendations / Plan   Plan  Continue with current plan of care      Progression Toward Goals   Progression toward goals  Progressing toward goals       SLP Education - 02/06/19 0914    Education Details  LSVT-LOUD    Person(s) Educated  Patient    Methods  Explanation    Comprehension  Verbalized understanding         SLP Long Term Goals - 01/30/19 1555      SLP LONG TERM GOAL #1   Title  The patient will complete Daily Tasks (Maximum duration "ah", High/Lows, and Functional Phrases) at average loudness of 80 dB and with loud, good quality voice.    Status  Partially Met    Target Date  02/12/19      SLP LONG TERM GOAL #2   Title  The patient will complete Hierarchal Speech Loudness reading drills (words/phrases, sentences, and paragraph) at average 75 dB and with loud, good quality voice.    Status  Partially Met    Target Date  02/12/19      SLP LONG TERM GOAL #3   Title  The patient will participate in conversation, maintaining average loudness of 75 dB and loud, good quality voice.    Status  Partially Met    Target Date  02/12/19      SLP LONG TERM GOAL #4   Title  The patient will complete homework daily.    Status  On-going    Target Date  02/12/19         Patient will benefit from skilled therapeutic intervention in order to improve the following deficits and impairments:   Dysphonia    Problem List Patient Active Problem List   Diagnosis Date Noted  . Loss of weight 04/20/2013  . Greater trochanteric bursitis of left hip 03/07/2013  . BPH (benign prostatic hypertrophy)   . HTN (hypertension)   . Diabetes type 2, uncontrolled ([place])   . Osteoarthritis   . Glaucoma   . HLD (hyperlipidemia)    Leroy Sea, MS/CCC- SLP  Lou Miner 02/06/2019, 9:15 AM  [place] MAIN [facility]  SERVICES [address] [place], [place], [address] Phone: [phone]   Fax:  [phone]   Name: Bradley [name] MRN: [id number] Date of Birth: [birth year]

## 2019-02-07 ENCOUNTER — Other Ambulatory Visit: Payer: Self-pay

## 2019-02-07 ENCOUNTER — Encounter: Payer: Self-pay | Admitting: Physical Therapy

## 2019-02-07 ENCOUNTER — Ambulatory Visit: Payer: Medicare Other | Admitting: Physical Therapy

## 2019-02-07 ENCOUNTER — Ambulatory Visit: Payer: Medicare Other | Admitting: Speech Pathology

## 2019-02-07 DIAGNOSIS — R490 Dysphonia: Secondary | ICD-10-CM | POA: Diagnosis not present

## 2019-02-07 DIAGNOSIS — R262 Difficulty in walking, not elsewhere classified: Secondary | ICD-10-CM

## 2019-02-07 DIAGNOSIS — M6281 Muscle weakness (generalized): Secondary | ICD-10-CM

## 2019-02-07 NOTE — Therapy (Signed)
[place] MAIN [facility] SERVICES [address] [place], [place], [address] Phone: [phone]   Fax:  [phone]  Physical Therapy Treatment  Patient Details  Name: Bradley [name] MRN: [id number] Date of Birth: 12/10/1935 Referring Provider (PT): Dr. Manuella Ghazi   Encounter Date: 02/07/2019  PT End of Session - 02/07/19 1356    Visit Number  15    Number of Visits  17    Date for PT Re-Evaluation  02/21/19    Authorization Type  1510 progress note    PT Start Time  1357    PT Stop Time  1454    PT Time Calculation (min)  57 min    Equipment Utilized During Treatment  Gait belt    Activity Tolerance  Patient tolerated treatment well    Behavior During Therapy  [facility] for tasks assessed/performed       Past Medical History:  Diagnosis Date  . Anemia   . BPH (benign prostatic hypertrophy)   . Diabetes type 2, controlled ([place])   . GERD (gastroesophageal reflux disease)    RARE  . Glaucoma    Syndor  . Hearing loss    bilateral hearing aids occasionally  . History of kidney stones   . HLD (hyperlipidemia)   . HTN (hypertension)   . Macular degeneration    Right ([place])  . Osteoarthritis    thumbs, hips, knees (s/p R TKR)  . PUD (peptic ulcer disease) 03/2013   2 antral gastric ulcers and 1 duodenal ulcer, NSAID related, admitted to [facility] with melena/anemia s/p EGD  . Sleep apnea    CPAP  . Tremor    intention    Past Surgical History:  Procedure Laterality Date  . [place]  2012  . carotid US  12/2011   no significant stenosis  . CATARACT EXTRACTION Bilateral 2011, 2012  . CHOLECYSTECTOMY  1997  . COLONOSCOPY  2009   per patient, told no longer needed  . CYSTOSCOPY W/ URETERAL STENT PLACEMENT Left 11/01/2016   Procedure: CYSTOSCOPY WITH STENT REPLACEMENT;  Surgeon: Hollice Espy, MD;  Location: [facility] ORS;  Service: Urology;  Laterality: Left;  . CYSTOSCOPY WITH STENT PLACEMENT Left 10/26/2016   Procedure:  CYSTOSCOPY WITH STENT PLACEMENT;  Surgeon: Hollice Espy, MD;  Location: [facility] ORS;  Service: Urology;  Laterality: Left;  . ESOPHAGOGASTRODUODENOSCOPY  04/2013   hospitalization @ [place], gastritis, gastric ulcers and duodenal ulcer with clean base ([place])  . HOLEP-LASER ENUCLEATION OF THE PROSTATE WITH MORCELLATION N/A 11/01/2016   Procedure: HOLEP-LASER ENUCLEATION OF THE PROSTATE WITH MORCELLATION;  Surgeon: Hollice Espy, MD;  Location: [facility] ORS;  Service: Urology;  Laterality: N/A;  . KIDNEY STONE SURGERY  83, 87, 90  . TOTAL KNEE ARTHROPLASTY Right 2006  . URETEROSCOPY Left 10/26/2016   Procedure: URETEROSCOPY;  Surgeon: Hollice Espy, MD;  Location: [facility] ORS;  Service: Urology;  Laterality: Left;  . URETEROSCOPY WITH HOLMIUM LASER LITHOTRIPSY Left 11/01/2016   Procedure: URETEROSCOPY WITH HOLMIUM LASER LITHOTRIPSY;  Surgeon: Hollice Espy, MD;  Location: [facility] ORS;  Service: Urology;  Laterality: Left;  . US ECHOCARDIOGRAPHY  12/2011   nl LV fxn, EF 60%, nl valves, dilated aortic root (23m)    There were no vitals filed for this visit.  Subjective Assessment - 02/07/19 1356    Subjective  Patient states that he did not sleep well last night because his legs (both) hurt from his knees to hips. Patient states it was "just shy" of leg cramps.  Pertinent History  Patient reports he was diagnosed with Parkinson's Disease December 23, 2016. Patient reports he has not noticed any weakness on one side of his body compared to the other. Since the COVID epidemic, patient reports that he has become more physically active and has lost weight. Patient is gradually built up to now being able to walk 1 mile a day. Patient reports he was walking with a cane prior to [place] and now is ambulating without assistive device. Patient states he feels good. Patient states he lives in a retirement community, [name], and was using the gym at first until [place]. Then, he started walking 3-4 days a week in April and  towards the end of the month now he walks daily. Patient is on weight watchers program and has lost about 20 pounds. Patient reports that before he lost weight and got in better shape his Parkinson's medication was not working as well and he was told that they might have to increase his dosage, but patient states he saw his neurologist recently and they did not need to adjust his medications. Patient usually uses [facility] for community mobility at times. Patient reports he has the most difficulty with dexterity in his fingertips and with stamina/endurance. 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 Patient wears glasses. Patient reports he only has 50-60% vision due to macular degeneration. 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 Patient gets tremors in the evening and when fatigued left hand tremors more than right.  Patient reports his hand writing has declined since his diagnosis. Patient reports that the medication helps his symptoms and that fatigue worsens his symptoms of Parkinson's. Patient reports that he has difficult with bending and walking. Patient would like to work on improving his balance, increase his walking endurance, be better able to work on model trains and with his speech. Patient is also enrolled in the LSVT LOUD program.    Patient Stated Goals  to learn the BIG exercises    Pain Onset  In the past 7 days          LSVT: Patient seen for LSVT Daily Session Maximal Daily Exercises for facilitation/coordination of movement Maximum Sustained Movements are designed to rescale the amplitude of movement output for generalization to daily functional activities. Performed as follows for 1 set of 10 repetitions each multi-directional sustained movements: 1) Floor to ceiling- patient able to preform independently referencing his home exercise packet to look at the instructions and  pictures 2) Side to side multidirectional- patient able to preform independently referencing his home exercise packet to look at the instructions and pictures  Repetitive movements performed in standing and are designed to provide retraining effort needed for sustained muscle activation in tasks. Performed as follows for 1 set of 10 repetitions each of multi-directional repetitive movements: 3) Step and reach forward step-patient able to preform independently referencing his home exercise packet to look at the instructions and pictures 4) Step and reach sideways step- patient able to preform independently referencing his home exercise packet to look at the instructions and pictures 5) Step and reach backwards step- cue to step back feet together to finish the movement and to dorsiflex forward foot 6) Rock and reach forward/backward- cue to step forward and hold feet in that position while shifting hips forward/ backwards as swing arms 7) Rock and reach sideways- modeling  and verbal cuing for this exercise today  Functional Component Task: 1.Sit to stand BIG functional component task with supervision 5 reps 2.Practiced BIG handwritingalternating betweenprintedand cursivefontthe word exercises-6reps 3.Practicedpicking upsmall beads and thenthreading them on an open paper clip.Performed first using right UE and then did L UE. 4. Performed on 2" x 4" board sidestepping left/right with and without horizontal head turns with CGA.-multiple reps 5. Performed on 2" x 4" board alternating between taking a sidestep and then performing a mini-squat with CGA-multiple reps. Patient had several small losses of balance that he touched // bar for support and several where patient was able to utilize hip and ankle strategies and ABD arms to regain balance.   Performedtandem walking on firm surface with CGA. Patient had several losses of balance that patient was able to use hip and ankle or stepping  strategies to self-correct with CGA.  BIG ambulation: Patient ambulated1000' with BIG arm and leg movements whileholding a conversation. Patient had no episodes of toe drag in swing phase.     PT Education - 02/07/19 1356    Education Details  Reviewed LSVT BIG exercises    Person(s) Educated  Patient    Methods  Explanation    Comprehension  Verbalized understanding       PT Short Term Goals - 01/30/19 1522      PT SHORT TERM GOAL #1   Title  Patient will be able to perform home program independently for self-management.    Time  4    Period  Weeks    Status  Partially Met    Target Date  02/05/19        PT Long Term Goals - 01/30/19 1522      PT LONG TERM GOAL #1   Title  Patient will reduce falls risk as indicated by Merrilee Jansky Balance Scale Score of > 50/56 to indicate decreased falls risk.    Baseline  scored 48/56 on 01/08/19    Time  8    Period  Weeks    Status  On-going      PT LONG TERM GOAL #2   Title  Patient will complete five times sit to stand test in 13 seconds or less indicating an increased LE strength and improved balance.    Baseline  scored 16 seconds without use of UEs on 01/08/2019    Time  8    Period  Weeks    Status  On-going      PT LONG TERM GOAL #3   Title  Patient will report 50% improvements in his finger dexterity as evidenced by improved hand writing ability and improved ability to work on his model trains    Time  8    Period  Weeks    Status  On-going            Plan - 02/07/19 1357    Clinical Impression Statement  Patient did very well with performing the SLVT BIG exercises this date. Patient required only a few cues for the more challenging exercises. Patient does well with the threading beads activity when using both his left and right hand. Patient able to work on higher level balance progressions this date as well. Patient would benefit from continued participation in the [place] program to work towards goals.     Personal Factors and Comorbidities  Comorbidity 3+;Age    Comorbidities  HTN, DM, OA, glaucoma, Parkinson's Disease, patient reports significan visual impairments due to macular degeneration  Examination-Participation Restrictions  Other   Management consultant  Evolving/Moderate complexity    Rehab Potential  Good    PT Frequency  4x / week    PT Duration  4 weeks    PT Treatment/Interventions  ADLs/Self Care Home Management;Stair training;Gait training;Neuromuscular re-education;Therapeutic activities;Therapeutic exercise;Balance training;Patient/family education    Consulted and Agree with Plan of Care  Patient       Patient will benefit from skilled therapeutic intervention in order to improve the following deficits and impairments:  Decreased balance, Decreased endurance, Decreased strength, Decreased mobility, Difficulty walking, Impaired vision/preception  Visit Diagnosis: Difficulty in walking, not elsewhere classified  Muscle weakness (generalized)     Problem List Patient Active Problem List   Diagnosis Date Noted  . Loss of weight 04/20/2013  . Greater trochanteric bursitis of left hip 03/07/2013  . BPH (benign prostatic hypertrophy)   . HTN (hypertension)   . Diabetes type 2, uncontrolled ([place])   . Osteoarthritis   . Glaucoma   . HLD (hyperlipidemia)    Lady Deutscher PT, DPT [phone] Lady Deutscher 02/07/2019, 4:58 PM  [place] MAIN [facility] SERVICES [address] [place], [place], [address] Phone: [phone]   Fax:  [phone]  Name: Bradley [name] MRN: [id number] Date of Birth: January 27, 1936

## 2019-02-08 ENCOUNTER — Ambulatory Visit: Payer: Medicare Other | Admitting: Physical Therapy

## 2019-02-08 ENCOUNTER — Encounter: Payer: Self-pay | Admitting: Physical Therapy

## 2019-02-08 ENCOUNTER — Encounter: Payer: Self-pay | Admitting: Speech Pathology

## 2019-02-08 ENCOUNTER — Other Ambulatory Visit: Payer: Self-pay

## 2019-02-08 ENCOUNTER — Ambulatory Visit: Payer: Medicare Other | Admitting: Speech Pathology

## 2019-02-08 DIAGNOSIS — R262 Difficulty in walking, not elsewhere classified: Secondary | ICD-10-CM

## 2019-02-08 DIAGNOSIS — M6281 Muscle weakness (generalized): Secondary | ICD-10-CM

## 2019-02-08 DIAGNOSIS — R490 Dysphonia: Secondary | ICD-10-CM | POA: Diagnosis not present

## 2019-02-08 NOTE — Therapy (Signed)
[place] MAIN [facility] SERVICES [address] [place], [place], [address] Phone: [phone]   Fax:  [phone]  Speech Language Pathology Treatment  Patient Details  Name: Bradley [name] MRN: [id number] Date of Birth: [birth year] Referring Provider (SLP): Dr. Manuella Ghazi   Encounter Date: 02/07/2019  End of Session - 02/08/19 1707    Visit Number  14    Number of Visits  17    Date for SLP Re-Evaluation  02/12/19    Authorization Type  Medicare    Authorization Time Period  Start 01/31/2019    Authorization - Visit Number  4    Authorization - Number of Visits  10    SLP Start Time  1500    SLP Stop Time   1550    SLP Time Calculation (min)  50 min    Activity Tolerance  Patient tolerated treatment well       Past Medical History:  Diagnosis Date  . Anemia   . BPH (benign prostatic hypertrophy)   . Diabetes type 2, controlled ([place])   . GERD (gastroesophageal reflux disease)    RARE  . Glaucoma    [place]  . Hearing loss    bilateral hearing aids occasionally  . History of kidney stones   . HLD (hyperlipidemia)   . HTN (hypertension)   . Macular degeneration    Right ([place])  . Osteoarthritis    thumbs, hips, knees (s/p R TKR)  . PUD (peptic ulcer disease) 03/2013   2 antral gastric ulcers and 1 duodenal ulcer, NSAID related, admitted to [facility] with melena/anemia s/p EGD  . Sleep apnea    CPAP  . Tremor    intention    Past Surgical History:  Procedure Laterality Date  . [place]  2012  . carotid US  12/2011   no significant stenosis  . CATARACT EXTRACTION Bilateral 2011, 2012  . CHOLECYSTECTOMY  1997  . COLONOSCOPY  2009   per patient, told no longer needed  . CYSTOSCOPY W/ URETERAL STENT PLACEMENT Left 11/01/2016   Procedure: CYSTOSCOPY WITH STENT REPLACEMENT;  Surgeon: Hollice Espy, MD;  Location: [facility] ORS;  Service: Urology;  Laterality: Left;  . CYSTOSCOPY WITH STENT PLACEMENT Left 10/26/2016   Procedure: CYSTOSCOPY WITH STENT PLACEMENT;  Surgeon: Hollice Espy, MD;  Location: [facility] ORS;  Service: Urology;  Laterality: Left;  . ESOPHAGOGASTRODUODENOSCOPY  04/2013   hospitalization @ [place], gastritis, gastric ulcers and duodenal ulcer with clean base ([place])  . HOLEP-LASER ENUCLEATION OF THE PROSTATE WITH MORCELLATION N/A 11/01/2016   Procedure: HOLEP-LASER ENUCLEATION OF THE PROSTATE WITH MORCELLATION;  Surgeon: Hollice Espy, MD;  Location: [facility] ORS;  Service: Urology;  Laterality: N/A;  . KIDNEY STONE SURGERY  83, 87, 90  . TOTAL KNEE ARTHROPLASTY Right 2006  . URETEROSCOPY Left 10/26/2016   Procedure: URETEROSCOPY;  Surgeon: Hollice Espy, MD;  Location: [facility] ORS;  Service: Urology;  Laterality: Left;  . URETEROSCOPY WITH HOLMIUM LASER LITHOTRIPSY Left 11/01/2016   Procedure: URETEROSCOPY WITH HOLMIUM LASER LITHOTRIPSY;  Surgeon: Hollice Espy, MD;  Location: [facility] ORS;  Service: Urology;  Laterality: Left;  . US ECHOCARDIOGRAPHY  12/2011   nl LV fxn, EF 60%, nl valves, dilated aortic root (69m)    There were no vitals filed for this visit.  Subjective Assessment - 02/08/19 1707    Subjective  Patient reports that he did not sleep well and is tired today            ADULT SLP TREATMENT -  02/08/19 0001      General Information   Behavior/Cognition  Alert;Cooperative;Pleasant mood    HPI   83 year old man with Parkinson's disease       Treatment Provided   Treatment provided  Cognitive-Linquistic      Pain Assessment   Pain Assessment  No/denies pain      Cognitive-Linquistic Treatment   Treatment focused on  Voice    Skilled Treatment  Daily Task #1 (Maximum sustained "ah"): Average 11 seconds, 80 dB. Daily Task 2 (Maximum fundamental frequency range): Highs: 15 high pitched "ah" given max cues (315 Hz). Lows: 15 low pitched "ah" given max cues (105 Hz). Daily task #3 (Maximum speech loudness drill of functional phrases): Average 75 dB.  Hierarchal speech loudness  drill:  Generate sentences, 72 dB. Conversation, 70 dB.  Homework: assignments completed.  Off the cuff remarks: average 72 dB.        Assessment / Recommendations / Plan   Plan  Continue with current plan of care      Progression Toward Goals   Progression toward goals  Progressing toward goals       SLP Education - 02/08/19 1707    Education Details  LSVT-LOUD    Person(s) Educated  Patient    Methods  Explanation    Comprehension  Verbalized understanding         SLP Long Term Goals - 01/30/19 1555      SLP LONG TERM GOAL #1   Title  The patient will complete Daily Tasks (Maximum duration "ah", High/Lows, and Functional Phrases) at average loudness of 80 dB and with loud, good quality voice.    Status  Partially Met    Target Date  02/12/19      SLP LONG TERM GOAL #2   Title  The patient will complete Hierarchal Speech Loudness reading drills (words/phrases, sentences, and paragraph) at average 75 dB and with loud, good quality voice.    Status  Partially Met    Target Date  02/12/19      SLP LONG TERM GOAL #3   Title  The patient will participate in conversation, maintaining average loudness of 75 dB and loud, good quality voice.    Status  Partially Met    Target Date  02/12/19      SLP LONG TERM GOAL #4   Title  The patient will complete homework daily.    Status  On-going    Target Date  02/12/19       Plan - 02/08/19 1708    Clinical Impression Statement  The patient is completing daily tasks and hierarchal speech drill tasks with louder, better quality voice given cues.  He appears to be more comfortable with loud voice.    Speech Therapy Frequency  4x / week    Duration  4 weeks    Treatment/Interventions  SLP instruction and feedback;Patient/family education;Other (comment)    Potential to Achieve Goals  Good    Potential Considerations  Ability to learn/carryover information;Previous level of function;Co-morbidities;Severity of  impairments;Cooperation/participation level;Medical prognosis;Family/community support    SLP Home Exercise Plan  LSVT-LOUD daily homework    Consulted and Agree with Plan of Care  Patient       Patient will benefit from skilled therapeutic intervention in order to improve the following deficits and impairments:   Dysphonia    Problem List Patient Active Problem List   Diagnosis Date Noted  . Loss of weight 04/20/2013  . Greater trochanteric bursitis of  left hip 03/07/2013  . BPH (benign prostatic hypertrophy)   . HTN (hypertension)   . Diabetes type 2, uncontrolled ([place])   . Osteoarthritis   . Glaucoma   . HLD (hyperlipidemia)    Leroy Sea, MS/CCC- SLP  Lou Miner 02/08/2019, 5:08 PM  [place] MAIN [facility] SERVICES [address] [place], [place], [address] Phone: [phone]   Fax:  [phone]   Name: Jevon Shells MRN: [id number] Date of Birth: [birth year]

## 2019-02-08 NOTE — Therapy (Signed)
[place] MAIN [facility] SERVICES [address] [place], [place], [address] Phone: [phone]   Fax:  [phone]  Physical Therapy Treatment  Patient Details  Name: Bradley [name] MRN: [id number] Date of Birth: 25-Apr-1936 Referring Provider (PT): Dr. Manuella Ghazi   Encounter Date: 02/08/2019  PT End of Session - 02/08/19 1401    Visit Number  16    Number of Visits  17    Date for PT Re-Evaluation  02/21/19    Authorization Type  1610 progress note    PT Start Time  1359    PT Stop Time  1500   Equipment Utilized During Treatment  Gait belt    Activity Tolerance  Patient tolerated treatment well    Behavior During Therapy  [facility] for tasks assessed/performed       Past Medical History:  Diagnosis Date  . Anemia   . BPH (benign prostatic hypertrophy)   . Diabetes type 2, controlled ([place])   . GERD (gastroesophageal reflux disease)    RARE  . Glaucoma    Syndor  . Hearing loss    bilateral hearing aids occasionally  . History of kidney stones   . HLD (hyperlipidemia)   . HTN (hypertension)   . Macular degeneration    Right ([place])  . Osteoarthritis    thumbs, hips, knees (s/p R TKR)  . PUD (peptic ulcer disease) 03/2013   2 antral gastric ulcers and 1 duodenal ulcer, NSAID related, admitted to [facility] with melena/anemia s/p EGD  . Sleep apnea    CPAP  . Tremor    intention    Past Surgical History:  Procedure Laterality Date  . [place]  2012  . carotid US  12/2011   no significant stenosis  . CATARACT EXTRACTION Bilateral 2011, 2012  . CHOLECYSTECTOMY  1997  . COLONOSCOPY  2009   per patient, told no longer needed  . CYSTOSCOPY W/ URETERAL STENT PLACEMENT Left 11/01/2016   Procedure: CYSTOSCOPY WITH STENT REPLACEMENT;  Surgeon: Hollice Espy, MD;  Location: [facility] ORS;  Service: Urology;  Laterality: Left;  . CYSTOSCOPY WITH STENT PLACEMENT Left 10/26/2016   Procedure: CYSTOSCOPY WITH STENT PLACEMENT;  Surgeon:  Hollice Espy, MD;  Location: [facility] ORS;  Service: Urology;  Laterality: Left;  . ESOPHAGOGASTRODUODENOSCOPY  04/2013   hospitalization @ [place], gastritis, gastric ulcers and duodenal ulcer with clean base ([place])  . HOLEP-LASER ENUCLEATION OF THE PROSTATE WITH MORCELLATION N/A 11/01/2016   Procedure: HOLEP-LASER ENUCLEATION OF THE PROSTATE WITH MORCELLATION;  Surgeon: Hollice Espy, MD;  Location: [facility] OR[facility];  Service: Urology;  Laterality: N/A;  . KIDNEY STONE SURGERY  83, 87, 90  . TOTAL KNEE ARTHROPLASTY Right 2006  . URETEROSCOPY Left 10/26/2016   Procedure: URETEROSCOPY;  Surgeon: Hollice Espy, MD;  Location: [facility] ORS;  Service: Urology;  Laterality: Left;  . URETEROSCOPY WITH HOLMIUM LASER LITHOTRIPSY Left 11/01/2016   Procedure: URETEROSCOPY WITH HOLMIUM LASER LITHOTRIPSY;  Surgeon: Hollice Espy, MD;  Location: [facility] ORS;  Service: Urology;  Laterality: Left;  . US ECHOCARDIOGRAPHY  12/2011   nl LV fxn, EF 60%, nl valves, dilated aortic root (65m)    There were no vitals filed for this visit.  Subjective Assessment - 02/08/19 1400    Subjective  Patient reports that he was not able to walk this morning because of the rain. Patient states he will try to do his exercises tomorrow especially since he probably will not be able to walk tomorrow due to weather.  Pertinent History  Patient reports he was diagnosed with Parkinson's Disease December 23, 2016. Patient reports he has not noticed any weakness on one side of his body compared to the other. Since the COVID epidemic, patient reports that he has become more physically active and has lost weight. Patient is gradually built up to now being able to walk 1 mile a day. Patient reports he was walking with a cane prior to [place] and now is ambulating without assistive device. Patient states he feels good. Patient states he lives in a retirement community, [name], and was using the gym at first until [place]. Then, he started walking 3-4 days a  week in April and towards the end of the month now he walks daily. Patient is on weight watchers program and has lost about 20 pounds. Patient reports that before he lost weight and got in better shape his Parkinson's medication was not working as well and he was told that they might have to increase his dosage, but patient states he saw his neurologist recently and they did not need to adjust his medications. Patient usually uses [facility] for community mobility at times. Patient reports he has the most difficulty with dexterity in his fingertips and with stamina/endurance. 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 Patient wears glasses. Patient reports he only has 50-60% vision due to macular degeneration. 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 Patient gets tremors in the evening and when fatigued left hand tremors more than right.  Patient reports his hand writing has declined since his diagnosis. Patient reports that the medication helps his symptoms and that fatigue worsens his symptoms of Parkinson's. Patient reports that he has difficult with bending and walking. Patient would like to work on improving his balance, increase his walking endurance, be better able to work on model trains and with his speech. Patient is also enrolled in the LSVT LOUD program.    Patient Stated Goals  to learn the BIG exercises    Pain Onset  In the past 7 days         LSVT: Patient seen for LSVT Daily Session Maximal Daily Exercises for facilitation/coordination of movement Maximum Sustained Movements are designed to rescale the amplitude of movement output for generalization to daily functional activities. Performed as follows for 1 set of 10 repetitions each multi-directional sustained movements: 1) Floor to ceiling- patient able to perform without modeling or verbal cuing this date 2) Side to side  multidirectional- patient able to perform without modeling or verbal cuing this date  Repetitive movements performed in standing and are designed to provide retraining effort needed for sustained muscle activation in tasks. Performed as follows for 1 set of 10 repetitions each of multi-directional repetitive movements: 3) Step and reach forward step- patient able to perform without modeling or verbal cuing this date 4) Step and reach sideways step-  Patient performed independently with HEP and written cues on the HEP sheet 5) Step and reach backwards step- Patient forgot to switch feet between sets but was able to correct after a cue 6) Rock and reach forward/backward- Patient initially was stepping back to feet together between rocking but able to correct after a cue to keep feet still after taking the inital step 7) Rock and reach sideways- patient required modeling cues for this exercise  Functional Component Task: 1.Sit to stand BIG  functional component task with supervision 5 reps  2.Practiced BIG handwritingalternating betweenprintedand cursivefontthe word exercises-6reps 3.Practicedpicking upsmall beads and thenthreading them on an open paper clip.Performed first using right UE and then did L UE. 4.Performed on 2" x 4" board sidestepping left/right with and without horizontal head turns with CGA-multiple reps 5. Performed on 2" x 4" board alternating between taking a sidestep and then performing a mini-squat with CGA-multiple reps. Patient had several small losses of balance that he touched // bar for support and several where patient was able to utilize hip and ankle strategies and ABD arms to regain balance.   BIG ambulation: Patient ambulated1500' with BIG arm and leg movements whileholding a conversation. Patient had no episodes of toe drag in swing phase.   PT Education - 02/08/19 1401    Education Educated as to [employer] program and discussed plan of care;  discussed trying to do exercises Friday, Saturday and Sunday at home using the LSVT BIG exercise packet   Person(s) Educated  Patient    Methods  Explanation    Comprehension  Verbalized understanding       PT Short Term Goals - 01/30/19 1522      PT SHORT TERM GOAL #1   Title  Patient will be able to perform home program independently for self-management.    Time  4    Period  Weeks    Status  Partially Met    Target Date  02/05/19        PT Long Term Goals - 01/30/19 1522      PT LONG TERM GOAL #1   Title  Patient will reduce falls risk as indicated by Merrilee Jansky Balance Scale Score of > 50/56 to indicate decreased falls risk.    Baseline  scored 48/56 on 01/08/19    Time  8    Period  Weeks    Status  On-going      PT LONG TERM GOAL #2   Title  Patient will complete five times sit to stand test in 13 seconds or less indicating an increased LE strength and improved balance.    Baseline  scored 16 seconds without use of UEs on 01/08/2019    Time  8    Period  Weeks    Status  On-going      PT LONG TERM GOAL #3   Title  Patient will report 50% improvements in his finger dexterity as evidenced by improved hand writing ability and improved ability to work on his model trains    Time  [place] - 02/08/19 1401    Clinical Impression Patient continues to progress towards independence with home exercise program. Patient requiring decreased verbal and modeling cues. Patient able to work on high level balance activities and progressions of balance exercises. Patient challenged by activities on 2" X 4" board. Plan on repeating functional outcome measures next session and discussing discharge plans.    Personal Factors and Comorbidities  Comorbidity 3+;Age    Comorbidities  HTN, DM, OA, glaucoma, Parkinson's Disease, patient reports significan visual impairments due to macular degeneration    Examination-Participation Restrictions  Other    Hobby train building   Stability/Clinical Decision Making  Evolving/Moderate complexity    Rehab Potential  Good    PT Frequency  4x / week    PT Duration  4 weeks  PT Treatment/Interventions  ADLs/Self Care Home Management;Stair training;Gait training;Neuromuscular re-education;Therapeutic activities;Therapeutic exercise;Balance training;Patient/family education    Consulted and Agree with Plan of Care  Patient       Patient will benefit from skilled therapeutic intervention in order to improve the following deficits and impairments:  Decreased balance, Decreased endurance, Decreased strength, Decreased mobility, Difficulty walking, Impaired vision/preception  Visit Diagnosis: Difficulty in walking, not elsewhere classified  Muscle weakness (generalized)     Problem List Patient Active Problem List   Diagnosis Date Noted  . Loss of weight 04/20/2013  . Greater trochanteric bursitis of left hip 03/07/2013  . BPH (benign prostatic hypertrophy)   . HTN (hypertension)   . Diabetes type 2, uncontrolled ([place])   . Osteoarthritis   . Glaucoma   . HLD (hyperlipidemia)    Lady Deutscher PT, DPT [phone] Lady Deutscher 02/08/2019, 2:02 PM  [place] MAIN [facility] SERVICES [address] [place], [place], [address] Phone: [phone]   Fax:  [phone]  Name: Fernandez Kenley MRN: [id number] Date of Birth: 11-Jan-1936

## 2019-02-08 NOTE — Therapy (Signed)
[place] MAIN [facility] SERVICES [address] [place], [place], [address] Phone: [phone]   Fax:  [phone]  Speech Language Pathology Treatment  Patient Details  Name: Bradley [name] MRN: [id number] Date of Birth: [birth year] Referring Provider (SLP): Dr. Manuella Ghazi   Encounter Date: 02/08/2019  End of Session - 02/08/19 1712    Visit Number  15    Number of Visits  17    Date for SLP Re-Evaluation  02/12/19    Authorization Type  Medicare    Authorization Time Period  Start 01/31/2019    Authorization - Visit Number  5    Authorization - Number of Visits  10    SLP Start Time  1500    SLP Stop Time   1550    SLP Time Calculation (min)  50 min    Activity Tolerance  Patient tolerated treatment well       Past Medical History:  Diagnosis Date  . Anemia   . BPH (benign prostatic hypertrophy)   . Diabetes type 2, controlled ([place])   . GERD (gastroesophageal reflux disease)    RARE  . Glaucoma    Syndor  . Hearing loss    bilateral hearing aids occasionally  . History of kidney stones   . HLD (hyperlipidemia)   . HTN (hypertension)   . Macular degeneration    Right ([place])  . Osteoarthritis    thumbs, hips, knees (s/p R TKR)  . PUD (peptic ulcer disease) 03/2013   2 antral gastric ulcers and 1 duodenal ulcer, NSAID related, admitted to [facility] with melena/anemia s/p EGD  . Sleep apnea    CPAP  . Tremor    intention    Past Surgical History:  Procedure Laterality Date  . [place]  2012  . carotid US  12/2011   no significant stenosis  . CATARACT EXTRACTION Bilateral 2011, 2012  . CHOLECYSTECTOMY  1997  . COLONOSCOPY  2009   per patient, told no longer needed  . CYSTOSCOPY W/ URETERAL STENT PLACEMENT Left 11/01/2016   Procedure: CYSTOSCOPY WITH STENT REPLACEMENT;  Surgeon: Hollice Espy, MD;  Location: [facility];  Service: Urology;  Laterality: Left;  . CYSTOSCOPY WITH STENT PLACEMENT Left 10/26/2016   Procedure: CYSTOSCOPY WITH STENT PLACEMENT;  Surgeon: Hollice Espy, MD;  Location: [facility];  Service: Urology;  Laterality: Left;  . ESOPHAGOGASTRODUODENOSCOPY  04/2013   hospitalization @ [place], gastritis, gastric ulcers and duodenal ulcer with clean base ([place])  . HOLEP-LASER ENUCLEATION OF THE PROSTATE WITH MORCELLATION N/A 11/01/2016   Procedure: HOLEP-LASER ENUCLEATION OF THE PROSTATE WITH MORCELLATION;  Surgeon: Hollice Espy, MD;  Location: [facility];  Service: Urology;  Laterality: N/A;  . KIDNEY STONE SURGERY  83, 87, 90  . TOTAL KNEE ARTHROPLASTY Right 2006  . URETEROSCOPY Left 10/26/2016   Procedure: URETEROSCOPY;  Surgeon: Hollice Espy, MD;  Location: [facility];  Service: Urology;  Laterality: Left;  . URETEROSCOPY WITH HOLMIUM LASER LITHOTRIPSY Left 11/01/2016   Procedure: URETEROSCOPY WITH HOLMIUM LASER LITHOTRIPSY;  Surgeon: Hollice Espy, MD;  Location: [facility];  Service: Urology;  Laterality: Left;  . US ECHOCARDIOGRAPHY  12/2011   nl LV fxn, EF 60%, nl valves, dilated aortic root (72m)    There were no vitals filed for this visit.  Subjective Assessment - 02/08/19 1711    Subjective  Patient disappointed he could not get his usual walk due to rain            ADULT SLP TREATMENT -  02/08/19 1710      General Information   Behavior/Cognition  Alert;Cooperative;Pleasant mood    HPI   83 year old man with Parkinson's disease       Treatment Provided   Treatment provided  Cognitive-Linquistic      Pain Assessment   Pain Assessment  No/denies pain      Cognitive-Linquistic Treatment   Treatment focused on  Voice    Skilled Treatment  Daily Task #1 (Maximum sustained "ah"): Average 11 seconds, 80 dB. Daily Task 2 (Maximum fundamental frequency range): Highs: 15 high pitched "ah" given max cues (315 Hz). Lows: 15 low pitched "ah" given max cues (105 Hz). Daily task #3 (Maximum speech loudness drill of functional phrases): Average 75 dB.  Hierarchal speech  loudness drill:  Generate sentences, 72 dB. Conversation, 70 dB.  Homework: assignments completed.  Off the cuff remarks: average 72 dB.        Assessment / Recommendations / Plan   Plan  Continue with current plan of care      Progression Toward Goals   Progression toward goals  Progressing toward goals       SLP Education - 02/08/19 1711    Education Details  LSVT-LOUD    Person(s) Educated  Patient    Methods  Explanation    Comprehension  Verbalized understanding         SLP Long Term Goals - 01/30/19 1555      SLP LONG TERM GOAL #1   Title  The patient will complete Daily Tasks (Maximum duration "ah", High/Lows, and Functional Phrases) at average loudness of 80 dB and with loud, good quality voice.    Status  Partially Met    Target Date  02/12/19      SLP LONG TERM GOAL #2   Title  The patient will complete Hierarchal Speech Loudness reading drills (words/phrases, sentences, and paragraph) at average 75 dB and with loud, good quality voice.    Status  Partially Met    Target Date  02/12/19      SLP LONG TERM GOAL #3   Title  The patient will participate in conversation, maintaining average loudness of 75 dB and loud, good quality voice.    Status  Partially Met    Target Date  02/12/19      SLP LONG TERM GOAL #4   Title  The patient will complete homework daily.    Status  On-going    Target Date  02/12/19       Plan - 02/08/19 1712    Clinical Impression Statement  The patient is completing daily tasks and hierarchal speech drill tasks with louder, better quality voice given cues.  He appears to be more comfortable with loud voice.    Speech Therapy Frequency  4x / week    Duration  4 weeks    Treatment/Interventions  SLP instruction and feedback;Patient/family education;Other (comment)    Potential to Achieve Goals  Good    Potential Considerations  Ability to learn/carryover information;Previous level of function;Co-morbidities;Severity of  impairments;Cooperation/participation level;Medical prognosis;Family/community support    SLP Home Exercise Plan  LSVT-LOUD daily homework    Consulted and Agree with Plan of Care  Patient       Patient will benefit from skilled therapeutic intervention in order to improve the following deficits and impairments:   Dysphonia    Problem List Patient Active Problem List   Diagnosis Date Noted  . Loss of weight 04/20/2013  . Greater trochanteric bursitis of  left hip 03/07/2013  . BPH (benign prostatic hypertrophy)   . HTN (hypertension)   . Diabetes type 2, uncontrolled ([place])   . Osteoarthritis   . Glaucoma   . HLD (hyperlipidemia)    Leroy Sea, MS/CCC- SLP  Lou Miner 02/08/2019, 5:13 PM  [place] MAIN [facility] SERVICES [address] [place], [place], [address] Phone: [phone]   Fax:  [phone]   Name: Bradley [name] MRN: [id number] Date of Birth: 08/31/1935

## 2019-02-12 ENCOUNTER — Ambulatory Visit: Payer: Medicare Other | Admitting: Physical Therapy

## 2019-02-12 ENCOUNTER — Ambulatory Visit: Payer: Medicare Other | Admitting: Speech Pathology

## 2019-02-12 ENCOUNTER — Encounter: Payer: Self-pay | Admitting: Speech Pathology

## 2019-02-12 ENCOUNTER — Encounter: Payer: Self-pay | Admitting: Physical Therapy

## 2019-02-12 ENCOUNTER — Other Ambulatory Visit: Payer: Self-pay

## 2019-02-12 VITALS — BP 123/69 | HR 68

## 2019-02-12 DIAGNOSIS — R490 Dysphonia: Secondary | ICD-10-CM

## 2019-02-12 DIAGNOSIS — R262 Difficulty in walking, not elsewhere classified: Secondary | ICD-10-CM

## 2019-02-12 DIAGNOSIS — M6281 Muscle weakness (generalized): Secondary | ICD-10-CM

## 2019-02-12 NOTE — Therapy (Signed)
[place] MAIN [facility] SERVICES [address] [place], [place], [address] Phone: [phone]   Fax:  [phone]  Speech Language Pathology Treatment/Discharge Summary  Patient Details  Name: Bradley [name] MRN: [id number] Date of Birth: 1935/08/21 Referring Provider (SLP): Dr. Manuella Ghazi   Encounter Date: 02/12/2019  End of Session - 02/12/19 1601    Visit Number  16    Number of Visits  17    Date for SLP Re-Evaluation  02/12/19    Authorization Type  Medicare    Authorization Time Period  Start 01/31/2019    Authorization - Visit Number  6    Authorization - Number of Visits  10    SLP Start Time  1500    SLP Stop Time   1550    SLP Time Calculation (min)  50 min    Activity Tolerance  Patient tolerated treatment well       Past Medical History:  Diagnosis Date  . Anemia   . BPH (benign prostatic hypertrophy)   . Diabetes type 2, controlled ([place])   . GERD (gastroesophageal reflux disease)    RARE  . Glaucoma    Syndor  . Hearing loss    bilateral hearing aids occasionally  . History of kidney stones   . HLD (hyperlipidemia)   . HTN (hypertension)   . Macular degeneration    Right ([place])  . Osteoarthritis    thumbs, hips, knees (s/p R TKR)  . PUD (peptic ulcer disease) 03/2013   2 antral gastric ulcers and 1 duodenal ulcer, NSAID related, admitted to [facility] with melena/anemia s/p EGD  . Sleep apnea    CPAP  . Tremor    intention    Past Surgical History:  Procedure Laterality Date  . [place]  2012  . carotid US  12/2011   no significant stenosis  . CATARACT EXTRACTION Bilateral 2011, 2012  . CHOLECYSTECTOMY  1997  . COLONOSCOPY  2009   per patient, told no longer needed  . CYSTOSCOPY W/ URETERAL STENT PLACEMENT Left 11/01/2016   Procedure: CYSTOSCOPY WITH STENT REPLACEMENT;  Surgeon: Hollice Espy, MD;  Location: [facility];  Service: Urology;  Laterality: Left;  . CYSTOSCOPY WITH STENT PLACEMENT Left  10/26/2016   Procedure: CYSTOSCOPY WITH STENT PLACEMENT;  Surgeon: Hollice Espy, MD;  Location: [facility];  Service: Urology;  Laterality: Left;  . ESOPHAGOGASTRODUODENOSCOPY  04/2013   hospitalization @ [place], gastritis, gastric ulcers and duodenal ulcer with clean base ([place])  . HOLEP-LASER ENUCLEATION OF THE PROSTATE WITH MORCELLATION N/A 11/01/2016   Procedure: HOLEP-LASER ENUCLEATION OF THE PROSTATE WITH MORCELLATION;  Surgeon: Hollice Espy, MD;  Location: [facility];  Service: Urology;  Laterality: N/A;  . KIDNEY STONE SURGERY  83, 87, 90  . TOTAL KNEE ARTHROPLASTY Right 2006  . URETEROSCOPY Left 10/26/2016   Procedure: URETEROSCOPY;  Surgeon: Hollice Espy, MD;  Location: [facility];  Service: Urology;  Laterality: Left;  . URETEROSCOPY WITH HOLMIUM LASER LITHOTRIPSY Left 11/01/2016   Procedure: URETEROSCOPY WITH HOLMIUM LASER LITHOTRIPSY;  Surgeon: Hollice Espy, MD;  Location: [facility];  Service: Urology;  Laterality: Left;  . US ECHOCARDIOGRAPHY  12/2011   nl LV fxn, EF 60%, nl valves, dilated aortic root (65m)    There were no vitals filed for this visit.  Subjective Assessment - 02/12/19 1559    Subjective  The patient feels confident that he will be able to speak loudly if someone lets him know that he is not loud enough.  SLP Education - 02/12/19 1600    Education Details  LSVT-LOUD forever homework    Person(s) Educated  Patient    Methods  Explanation    Comprehension  Verbalized understanding         SLP Long Term Goals - 02/12/19 1602      SLP LONG TERM GOAL #1   Title  The patient will complete Daily Tasks (Maximum duration "ah", High/Lows, and Functional Phrases) at average loudness of 80 dB and with loud, good quality voice.    Status  Achieved      SLP LONG TERM GOAL #2   Title  The patient will complete Hierarchal Speech Loudness reading drills (words/phrases, sentences, and paragraph) at average 75 dB and with loud, good quality voice.     Status  Achieved      SLP LONG TERM GOAL #3   Title  The patient will participate in conversation, maintaining average loudness of 75 dB and loud, good quality voice.    Status  Achieved      SLP LONG TERM GOAL #4   Title  The patient will complete homework daily.    Status  Achieved       Plan - 02/12/19 1602    Clinical Impression Statement  The patient has completed the LSVT-LOUD program and has met his goals.  He is completing daily tasks and hierarchal speech drill tasks with louder, better quality voice given occasional cues.  He appears to be more comfortable with loud voice.    Speech Therapy Frequency  4x / week    Duration  4 weeks    Treatment/Interventions  SLP instruction and feedback;Patient/family education;Other (comment)    Potential to Achieve Goals  Good    Potential Considerations  Ability to learn/carryover information;Previous level of function;Co-morbidities;Severity of impairments;Cooperation/participation level;Medical prognosis;Family/community support    SLP Home Exercise Plan  LSVT-LOUD "Forever" homework    Consulted and Agree with Plan of Care  Patient       Patient will benefit from skilled therapeutic intervention in order to improve the following deficits and impairments:   Dysphonia    Problem List Patient Active Problem List   Diagnosis Date Noted  . Loss of weight 04/20/2013  . Greater trochanteric bursitis of left hip 03/07/2013  . BPH (benign prostatic hypertrophy)   . HTN (hypertension)   . Diabetes type 2, uncontrolled ([place])   . Osteoarthritis   . Glaucoma   . HLD (hyperlipidemia)     Leroy Sea, MS/CCC- SLP  Lou Miner 02/12/2019, 4:04 PM  [place] MAIN [facility] SERVICES [address] [place], [place], [address] Phone: [phone]   Fax:  [phone]   Name: Kirke Breach MRN: [id number] Date of Birth: [birth year]

## 2019-02-12 NOTE — Therapy (Signed)
[place] MAIN [facility] SERVICES [address] [place], [place], [address] Phone: [phone]   Fax:  [phone]  Physical Therapy Treatment/Discharge Summary Dates of services: 01/08/19-92120 Total number of visits: 17  Patient Details  Name: Bradley [name] MRN: [id number] Date of Birth: May 06, 1936 Referring Provider (PT): Dr. Manuella Ghazi   Encounter Date: 02/12/2019  PT End of Session - 02/12/19 1400    Visit Number  17    Number of Visits  17    Date for PT Re-Evaluation  02/21/19    Authorization Type  17/10 progress note    PT Start Time  1400    PT Stop Time  1500    PT Time Calculation (min)  60 min    Equipment Utilized During Treatment  Gait belt    Activity Tolerance  Patient tolerated treatment well    Behavior During Therapy  [facility] for tasks assessed/performed       Past Medical History:  Diagnosis Date  . Anemia   . BPH (benign prostatic hypertrophy)   . Diabetes type 2, controlled ([place])   . GERD (gastroesophageal reflux disease)    RARE  . Glaucoma    [place]  . Hearing loss    bilateral hearing aids occasionally  . History of kidney stones   . HLD (hyperlipidemia)   . HTN (hypertension)   . Macular degeneration    Right ([place])  . Osteoarthritis    thumbs, hips, knees (s/p R TKR)  . PUD (peptic ulcer disease) 03/2013   2 antral gastric ulcers and 1 duodenal ulcer, NSAID related, admitted to [facility] with melena/anemia s/p EGD  . Sleep apnea    CPAP  . Tremor    intention    Past Surgical History:  Procedure Laterality Date  . [place]  2012  . carotid US  12/2011   no significant stenosis  . CATARACT EXTRACTION Bilateral 2011, 2012  . CHOLECYSTECTOMY  1997  . COLONOSCOPY  2009   per patient, told no longer needed  . CYSTOSCOPY W/ URETERAL STENT PLACEMENT Left 11/01/2016   Procedure: CYSTOSCOPY WITH STENT REPLACEMENT;  Surgeon: Hollice Espy, MD;  Location: [facility] ORS;  Service: Urology;   Laterality: Left;  . CYSTOSCOPY WITH STENT PLACEMENT Left 10/26/2016   Procedure: CYSTOSCOPY WITH STENT PLACEMENT;  Surgeon: Hollice Espy, MD;  Location: [facility] ORS;  Service: Urology;  Laterality: Left;  . ESOPHAGOGASTRODUODENOSCOPY  04/2013   hospitalization @ [place], gastritis, gastric ulcers and duodenal ulcer with clean base ([place])  . HOLEP-LASER ENUCLEATION OF THE PROSTATE WITH MORCELLATION N/A 11/01/2016   Procedure: HOLEP-LASER ENUCLEATION OF THE PROSTATE WITH MORCELLATION;  Surgeon: Hollice Espy, MD;  Location: [facility] OR[facility];  Service: Urology;  Laterality: N/A;  . KIDNEY STONE SURGERY  83, 87, 90  . TOTAL KNEE ARTHROPLASTY Right 2006  . URETEROSCOPY Left 10/26/2016   Procedure: URETEROSCOPY;  Surgeon: Hollice Espy, MD;  Location: [facility] OR[facility];  Service: Urology;  Laterality: Left;  . URETEROSCOPY WITH HOLMIUM LASER LITHOTRIPSY Left 11/01/2016   Procedure: URETEROSCOPY WITH HOLMIUM LASER LITHOTRIPSY;  Surgeon: Hollice Espy, MD;  Location: [facility] OR[facility];  Service: Urology;  Laterality: Left;  . US ECHOCARDIOGRAPHY  12/2011   nl LV fxn, EF 60%, nl valves, dilated aortic root (46m)    Vitals:   02/12/19 1600  BP: 123/69  Pulse: 68  SpO2: 99%    Subjective Assessment - 02/12/19 1359    Subjective  Patient reports he can tell a difference with being able to do things  at home and states he really likes the results he sees. Patient reports that his wife has noticed an improvement in his movements.    Pertinent History  Patient reports he was diagnosed with Parkinson's Disease December 23, 2016. Patient reports he has not noticed any weakness on one side of his body compared to the other. Since the COVID epidemic, patient reports that he has become more physically active and has lost weight. Patient is gradually built up to now being able to walk 1 mile a day. Patient reports he was walking with a cane prior to [place] and now is ambulating without assistive device. Patient states he feels good. Patient  states he lives in a retirement community, [name], and was using the gym at first until [place]. Then, he started walking 3-4 days a week in April and towards the end of the month now he walks daily. Patient is on weight watchers program and has lost about 20 pounds. Patient reports that before he lost weight and got in better shape his Parkinson's medication was not working as well and he was told that they might have to increase his dosage, but patient states he saw his neurologist recently and they did not need to adjust his medications. Patient usually uses [facility] for community mobility at times. Patient reports he has the most difficulty with dexterity in his fingertips and with stamina/endurance. 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 Patient wears glasses. Patient reports he only has 50-60% vision due to macular degeneration. 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 Patient gets tremors in the evening and when fatigued left hand tremors more than right.  Patient reports his hand writing has declined since his diagnosis. Patient reports that the medication helps his symptoms and that fatigue worsens his symptoms of Parkinson's. Patient reports that he has difficult with bending and walking. Patient would like to work on improving his balance, increase his walking endurance, be better able to work on model trains and with his speech. Patient is also enrolled in the LSVT LOUD program.    Patient Stated Goals  to learn the BIG exercises    Currently in Pain?  No/denies    Pain Onset  In the past 7 days         [facility] PT Assessment - 02/12/19 1440      Berg Balance Test   Sit to Stand  Able to stand without using hands and stabilize independently    Standing Unsupported  Able to stand safely 2 minutes    Sitting with Back Unsupported but Feet Supported on Floor or Stool  Able to sit safely and  securely 2 minutes    Stand to Sit  Sits safely with minimal use of hands    Transfers  Able to transfer safely, minor use of hands    Standing Unsupported with Eyes Closed  Able to stand 10 seconds safely    Standing Unsupported with Feet Together  Able to place feet together independently and stand 1 minute safely    From Standing, Reach Forward with Outstretched Arm  Can reach confidently >25 cm (10")    From Standing Position, Pick up Object from Floor  Able to pick up shoe safely and easily    From Standing Position, Turn to Look Behind Over each Shoulder  Looks behind from both sides and weight  shifts well    Turn 360 Degrees  Able to turn 360 degrees safely in 4 seconds or less    Standing Unsupported, Alternately Place Feet on Step/Stool  Able to stand independently and safely and complete 8 steps in 20 seconds    Standing Unsupported, One Foot in Front  Able to plae foot ahead of the other independently and hold 30 seconds    Standing on One Leg  Able to lift leg independently and hold equal to or more than 3 seconds    Total Score  53      Timed Up and Go Test   Normal TUG (seconds)  12    Manual TUG (seconds)  14    Cognitive TUG (seconds)  12      Patient reports that his wife has noticed an improvement in his movements.     LSVT: Patient seen for LSVT Daily Session Maximal Daily Exercises for facilitation/coordination of movement Maximum Sustained Movements are designed to rescale the amplitude of movement output for generalization to daily functional activities. Performed as follows for 1 set of 10 repetitions each multi-directional sustained movements: 1) Floor to ceiling-patient performed independently utilizing his home exercise program packet and notes  2) Side to side multidirectional-patient performed independently utilizing his home exercise program packet and notes   Repetitive movements performed in standing and are designed to provide retraining effort needed for  sustained muscle activation in tasks. Performed as follows for 1 set of 10 repetitions each of multi-directional repetitive movements: 3) Step and reach forward step-patient performed independently utilizing his home exercise program packet and notes   4) Step and reach sideways step-  One cue to turn toe out more on the side stepping towards and then patient was able to perform the exercise 5) Step and reach backwards step- patient performed independently utilizing his home exercise program packet and notes; patient did well with dorsiflexing foot and bending forward as well as remembering to step backwards instead of forwards. 6) Rock and reach forward/backward- one cue to not take such a big step and then patient able to perform without further cuing. 7) Rock and reach sideways- Patient reported dizziness with first rep of this exercise. Denies vertigo. Patient has never reported dizziness before with any of the exercises. Patients HR 68 bpm and oxygen sats 99%. BP was 123/69 mmHg. Patient required a modeling cue for this exercise.  Functional Component Task: 1.Sit to stand BIG functional component task with supervision 5 reps 2.Practiced BIG handwritingalternating betweenprintedand cursivefontthe word exercises-6reps  FUNCTIONAL OUTCOME MEASURES:  Results Comments  Berg balance scale 53/56 Mild fall risk; <45/56 indicates falls risk  Five times sit to stand 15 seconds without use of UEs >16 sec indicates falls risk for patients with Parkinson's Disease  TUG 12 sec normal; 14 sec manual/carry and 12 sec cognitive >14 seconds indicates falls risk    BIG ambulation:  Patient ambulated 500' with BIG movement patterns without loss of balance with reciprocal arm swing and step through gait pattern.     PT Education - 02/12/19 1359    Education Details  discussed discharge plans and to continue to perform LSVT BIG exercises daily    Person(s) Educated  Patient    Methods   Explanation    Comprehension  Verbalized understanding       PT Short Term Goals - 02/12/19 1457      PT SHORT TERM GOAL #1   Title  Patient will be able to perform  home program independently for self-management.    Time  4    Period  Weeks    Status  Achieved    Target Date  02/05/19      PT SHORT TERM GOAL #2   Status  Achieved        PT Long Term Goals - 02/12/19 1455      PT LONG TERM GOAL #1   Title  Patient will reduce falls risk as indicated by Merrilee Jansky Balance Scale Score of > 50/56 to indicate decreased falls risk.    Baseline  scored 48/56 on 01/08/19    Time  8    Period  Weeks    Status  Achieved      PT LONG TERM GOAL #2   Title  Patient will complete five times sit to stand test in 13 seconds or less indicating an increased LE strength and improved balance.    Baseline  scored 16 seconds without use of UEs on 01/08/2019    Time  8    Period  Weeks    Status  Partially Met      PT LONG TERM GOAL #3   Title  Patient will report 50% improvements in his finger dexterity as evidenced by improved hand writing ability and improved ability to work on his model trains    Baseline  patient reports 50% at least in his fingers since starting therapy    Time  8    Period  Weeks    Status  Achieved            Plan - 02/12/19 1400    Clinical Impression Statement  Patient has demonstrated good progress with therapy and he has met 1/1 short term goal of independence with HEP and 3/4 long term goals. Patient reports 50% improvement in his finger dexterity and movement since starting therapy. Patient improved from a 48/56 to a 53/56 on the Berg balance test indicating decreased falls risk. Patient scored 15 seconds on the five times sit to stand test. Patient reports that he plans to continue to perform HEP upon discharge. Will plan on discharging patient from PT services at this time.    Personal Factors and Comorbidities  Comorbidity 3+;Age    Comorbidities  HTN, DM, OA,  glaucoma, Parkinson's Disease, patient reports significan visual impairments due to macular degeneration    Examination-Participation Restrictions  Other   Hobby train building   Stability/Clinical Decision Making  Evolving/Moderate complexity    Rehab Potential  Good    PT Frequency  4x / week    PT Duration  4 weeks    PT Treatment/Interventions  ADLs/Self Care Home Management;Stair training;Gait training;Neuromuscular re-education;Therapeutic activities;Therapeutic exercise;Balance training;Patient/family education    Consulted and Agree with Plan of Care  Patient       Patient will benefit from skilled therapeutic intervention in order to improve the following deficits and impairments:  Decreased balance, Decreased endurance, Decreased strength, Decreased mobility, Difficulty walking, Impaired vision/preception  Visit Diagnosis: Difficulty in walking, not elsewhere classified  Muscle weakness (generalized)     Problem List Patient Active Problem List   Diagnosis Date Noted  . Loss of weight 04/20/2013  . Greater trochanteric bursitis of left hip 03/07/2013  . BPH (benign prostatic hypertrophy)   . HTN (hypertension)   . Diabetes type 2, uncontrolled ([place])   . Osteoarthritis   . Glaucoma   . HLD (hyperlipidemia)    Lady Deutscher PT, DPT [phone] Murphy,Dorriea 02/12/2019, 4:05 PM  [place] MAIN [facility] SERVICES [address] [place], [place], [address] Phone: [phone]   Fax:  [phone]  Name: Bradley [name] MRN: [id number] Date of Birth: [birth year]

## 2019-06-19 ENCOUNTER — Encounter: Payer: Self-pay | Admitting: Physical Therapy

## 2019-06-26 ENCOUNTER — Ambulatory Visit: Payer: Medicare Other | Attending: Neurology | Admitting: Physical Therapy

## 2019-06-26 ENCOUNTER — Other Ambulatory Visit: Payer: Self-pay

## 2019-06-26 ENCOUNTER — Encounter: Payer: Self-pay | Admitting: Physical Therapy

## 2019-06-26 DIAGNOSIS — R42 Dizziness and giddiness: Secondary | ICD-10-CM | POA: Insufficient documentation

## 2019-06-26 NOTE — Therapy (Addendum)
[place] MAIN [facility] SERVICES [address] [place], [place], [address] Phone: [phone]   Fax:  [phone]  Physical Therapy Evaluation  Patient Details  Name: Bradley [name] MRN: [id number] Date of Birth: 08/28/1935 Referring Provider (PT): Dr. Manuella Ghazi   Encounter Date: 06/26/2019  PT End of Session - 06/26/19 1708    Visit Number  1    Number of Visits  9    Date for PT Re-Evaluation  08/22/19    PT Start Time  0947    PT Stop Time  1025    PT Time Calculation (min)  38 min    Equipment Utilized During Treatment  Gait belt    Activity Tolerance  Patient tolerated treatment well    Behavior During Therapy  [facility] for tasks assessed/performed       Past Medical History:  Diagnosis Date  . Anemia   . BPH (benign prostatic hypertrophy)   . Diabetes type 2, controlled ([place])   . GERD (gastroesophageal reflux disease)    RARE  . Glaucoma    [place]  . Hearing loss    bilateral hearing aids occasionally  . History of kidney stones   . HLD (hyperlipidemia)   . HTN (hypertension)   . Macular degeneration    Right ([place])  . Osteoarthritis    thumbs, hips, knees (s/p R TKR)  . PUD (peptic ulcer disease) 03/2013   2 antral gastric ulcers and 1 duodenal ulcer, NSAID related, admitted to [facility] with melena/anemia s/p EGD  . Sleep apnea    CPAP  . Tremor    intention    Past Surgical History:  Procedure Laterality Date  . [place]  2012  . carotid US  12/2011   no significant stenosis  . CATARACT EXTRACTION Bilateral 2011, 2012  . CHOLECYSTECTOMY  1997  . COLONOSCOPY  2009   per patient, told no longer needed  . CYSTOSCOPY W/ URETERAL STENT PLACEMENT Left 11/01/2016   Procedure: CYSTOSCOPY WITH STENT REPLACEMENT;  Surgeon: Hollice Espy, MD;  Location: [facility] ORS;  Service: Urology;  Laterality: Left;  . CYSTOSCOPY WITH STENT PLACEMENT Left 10/26/2016   Procedure: CYSTOSCOPY WITH STENT PLACEMENT;  Surgeon: Hollice Espy, MD;  Location: [facility] ORS;  Service: Urology;  Laterality: Left;  . ESOPHAGOGASTRODUODENOSCOPY  04/2013   hospitalization @ [place], gastritis, gastric ulcers and duodenal ulcer with clean base ([place])  . HOLEP-LASER ENUCLEATION OF THE PROSTATE WITH MORCELLATION N/A 11/01/2016   Procedure: HOLEP-LASER ENUCLEATION OF THE PROSTATE WITH MORCELLATION;  Surgeon: Hollice Espy, MD;  Location: [facility] ORS;  Service: Urology;  Laterality: N/A;  . KIDNEY STONE SURGERY  83, 87, 90  . TOTAL KNEE ARTHROPLASTY Right 2006  . URETEROSCOPY Left 10/26/2016   Procedure: URETEROSCOPY;  Surgeon: Hollice Espy, MD;  Location: [facility] ORS;  Service: Urology;  Laterality: Left;  . URETEROSCOPY WITH HOLMIUM LASER LITHOTRIPSY Left 11/01/2016   Procedure: URETEROSCOPY WITH HOLMIUM LASER LITHOTRIPSY;  Surgeon: Hollice Espy, MD;  Location: [facility] ORS;  Service: Urology;  Laterality: Left;  . US ECHOCARDIOGRAPHY  12/2011   nl LV fxn, EF 60%, nl valves, dilated aortic root (20mm)    There were no vitals filed for this visit.   Subjective Assessment - 06/27/19 1344    Subjective  Patient reports that he saw Dr. Manuella Ghazi on January 22, 21 and that his dizziness symptoms have completely resolved a few days after his medications were adjusted. Patient states he has been doing well and that he has been  able to do his daily walking. Patient states he has not had any further episodes of dizziness or vertigo.    Pertinent History  History obtained from medical record, patient and patient's wife's report. Patient reports that he bean to have episodes of dizziness the week of June 04, 2019. Patient reports he had been having dizziness off and on for about 10 days in January. Patient reports that when he was having the dizziness, it would last hours to all day. Patient describes his dizziness as vertigo, unsteadiness and lightheadedness. Patient's dizziness symptoms were intermittent and motion provoked. Patient reports that the dizziness  was provoked by quick movements, lifting and moving. Patient reports only the medication change after he went to see Dr. Sherryll Burger improved his symptoms. Patient reports that when he was experiencing the dizziness symptoms in January that he was also having difficulty with sleeping. Patient states he was waking up at 5 am and that he had a difficult time going back to sleep. Patient reports that he saw Dr. Sherryll Burger, neurologist, on 06/15/19. Patient reports that Dr. Sherryll Burger increased his Sinemet and Melatonin medications on 1/22 and that a few days after these medication changes, he reports that his dizziness symptoms completely resolved. Patient reports that since the medication changes, he has also noticed a decrease in his tremors and that now he has been able to sleep through the night. Patient does report that he had a prior episode of dizziness in 2015 after a sinus infection and reports that the dizziness symptoms resolved on its own at that time.    Patient Stated Goals  to continue to have no further episodes of vertigo or dizziness         [facility] PT Assessment - 06/27/19 1329      Assessment   Medical Diagnosis  atypical Parkinsonism    Referring Provider (PT)  Dr. Sherryll Burger    Onset Date/Surgical Date  --   week of Jun 04, 2019   Prior Therapy  no prior vestibular therapy      Precautions   Precautions  Fall      Restrictions   Weight Bearing Restrictions  No      Balance Screen   Has the patient fallen in the past 6 months  No    Has the patient had a decrease in activity level because of a fear of falling?   No    Is the patient reluctant to leave their home because of a fear of falling?   No      Home Environment   Living Environment  Private residence    Available Help at Discharge  Friend(s);Family    Type of Home  House   at [facility]       VESTIBULAR AND BALANCE EVALUATION   HISTORY:  Subjective history of current problem: History obtained from  medical record, patient and patient's wife's report. Patient reports that he bean to have episodes of dizziness the week of June 04, 2019. Patient reports he had been having dizziness off and on for about 10 days in January. Patient reports that when he was having the dizziness, it would last hours to all day. Patient describes his dizziness as vertigo, unsteadiness and lightheadedness. Patient's dizziness symptoms were intermittent and motion provoked. Patient reports that the dizziness was provoked by quick movements, lifting and moving. Patient reports only the medication change after he went to see Dr. Sherryll Burger improved his symptoms. Patient reports that when he was  experiencing the dizziness symptoms in January that he was also having difficulty with sleeping. Patient states he was waking up at 5 am and that he had a difficult time going back to sleep. Patient reports that he saw Dr. Sherryll Burger, neurologist, on 06/15/19. Patient reports that Dr. Sherryll Burger increased his Sinemet and Melatonin medications on 1/22 and that a few days after these medication changes, he reports that his dizziness symptoms completely resolved. Patient reports that since the medication changes, he has also noticed a decrease in his tremors and that now he has been able to sleep through the night. Patient does report that he had a prior episode of dizziness in 2015 after a sinus infection and reports that the dizziness symptoms resolved on its own at that time.   Patient was using the [facility] in public prior to starting the [employer] program in 2020. Patient states that he does not need the [facility] now. Patient known to this Clinical research associate as patient completed the [employer] program September 2020. Patient reports that has not been doing his daily LSV[employer] home exercise program, but reports he has been doing daily walking. Patient noted to have some minor gait changes including decreased left arm swing, left leg step length and height. Reeducated patient about  importance of doing the daily [employer]SV[employer] BIG exercises and patient states that he will not do the exercises daily but states he will try to do the HEP 2-3 times a week. Discussed doing therapy to address some of the balance/gait deficits however patient declined at this time.   Progression of symptoms: better History of similar episodes: yes  Falls (yes/no): no Number of falls in past 6 months: no  Auditory complaints (tinnitus, pain, drainage): denies Vision (last eye exam, diplopia, recent changes): patient wears glasses; denies recent vision changes states he has visual impairment chronically in the left eye  Current Symptoms: (dysarthria, dysphagia, drop attacks, bowel and bladder changes, recent weight loss/gain)    Review of systems negative for red flags.     EXAMINATION  MUSCULOSKELETAL SCREEN: Cervical Spine ROM: WNL with cervical AROM flexion, extension, left and right rotation   Functional Mobility: independent with transfers  Gait: Patient arrives ambulating without AD. Patient ambulates with decreased step length with mildly decreased left arm swing and left step length and height. Scanning of visual environment with gait is: fair   Balance: Patient able to perform retro ambulation with head turns without imbalance or veering noted.   OCULOMOTOR / VESTIBULAR TESTING:  Oculomotor Exam- Room Light  Normal Abnormal Comments  Ocular Alignment N    Ocular ROM N    Spontaneous Nystagmus N    Gaze evoked Nystagmus N    Smooth Pursuit N    Saccades  Abn Multiple saccadic movements noted especially with left field vision and noted hypometric saccades  VOR   deferred  VOR Cancellation N  Staying in focus; no dizziness  Left Head Impulse N    Right Head Impulse N      BPPV TESTS:  Symptoms Duration Intensity Nystagmus  Left Dix-Hallpike None  N/A None observed  Right Dix-Hallpike None  N/A None observed  Left Head Roll None  N/A None observed  Right Head Roll None  N/A  None observed    Ambulation with head turns:  Patient performed 5' trials of forwards and retro ambulation with horizontal and vertical head turns with CGA.   Patient denies dizziness and vertigo and noted no veering.     PT  Education - 06/26/19 1706    Education Details  discussed plan of care; patient reports that he has not had any further episodes of dizziness since the change in his medication and patient had negative Dix-Hallpike testing and reported no dizziness during evaluation; discussed discharge from therapy in a few weeks if patient's dizziness does not return. Patient and his wife are in agreement.    Person(s) Educated  Patient;Spouse    Methods  Explanation    Comprehension  Verbalized understanding          PT Long Term Goals - 06/27/19 1324      PT LONG TERM GOAL #1   Title  Patient will report that he has had no further significant episodes of dizziness or vertigo in order for patient to be able to perform all of his normal daily activities safely.    Time  4    Period  Weeks    Status  New    Target Date  07/24/19             Plan - 06/27/19 1322    Clinical Impression Statement  Patient reports that he had episodes of dizziness and vertigo on and off for about 10 days in January 2021. Patient states that after he saw Dr. Sherryll BurgerShah and his medications were adjusted on 06/15/19, that his dizziness sympotms have completely resolved after the increase in Melatonin and Sinemet. Patient reports he has not had any further episodes of dizziness and vertigo and unable to bring on symptoms during the vestibular evaluation. Patient with negative Dix-Hallpike and horizontal canal testing bilaterally. Patient did demosntrate abnormal saccades which could be indicative of a central sign with vestibular testing however patient denied dizziness with testing. Patient able to perform forward and retro ambulation with horizontal and vertical head turns without dizziness or veering  noted. Patient familiar to this Clinical research associatewriter as patient completed the [employer] program in September 2020 and have noted minor changes with patient's gait skills. Patient reports that he has not been doing the [employer] BIG home exercise program, but states he has been doing dialy walking. Encouraged patient to resume LSVT BIG daily exercises and patient reports that he will try to do HEP 2-3 times a week. Patient declined further follow-up therapy for LSVT Parkinsonism deficits at this time. Discussed with patient and his wife plan of care and both are in agreement that since patient has not had any further episodes of dizziness or vertigo that will plan on contacting clinic in about 4 weeks and that if patient has continued to be symptom free from dizziness that will plan on discharging from therapy at that time.    Personal Factors and Comorbidities  Comorbidity 3+    Comorbidities  HTN, DM, atypical Parkinsonism    Stability/Clinical Decision Making  Stable/Uncomplicated    Clinical Decision Making  Low    Rehab Potential  Good    PT Frequency  1x / week    PT Duration  8 weeks    PT Treatment/Interventions  Canalith Repostioning;Gait training;Stair training;Therapeutic activities;Therapeutic exercise;Balance training;Neuromuscular re-education;Patient/family education;Vestibular    Consulted and Agree with Plan of Care  Patient;Family member/caregiver    Family Member Consulted  Patient's wife       Patient will benefit from skilled therapeutic intervention in order to improve the following deficits and impairments:  Decreased balance, Dizziness, Difficulty walking  Visit Diagnosis: Dizziness and giddiness     Problem List Patient Active Problem List   Diagnosis Date  Noted  . Loss of weight 04/20/2013  . Greater trochanteric bursitis of left hip 03/07/2013  . BPH (benign prostatic hypertrophy)   . HTN (hypertension)   . Diabetes type 2, uncontrolled ([facility])   . Osteoarthritis   . Glaucoma   .  HLD (hyperlipidemia)    Mardelle Matte PT, DPT [phone] Mardelle Matte 06/27/2019, 1:58 PM  [place] [facility] MAIN [facility] SERVICES [address] [place], [place], [address] Phone: [phone]   Fax:  [phone]  Name: Bradley [name] MRN: [id number] Date of Birth: April 16, 1936

## 2019-07-17 ENCOUNTER — Encounter: Payer: PRIVATE HEALTH INSURANCE | Admitting: Physical Therapy

## 2019-07-24 ENCOUNTER — Encounter: Payer: PRIVATE HEALTH INSURANCE | Admitting: Physical Therapy

## 2019-07-31 ENCOUNTER — Encounter: Payer: PRIVATE HEALTH INSURANCE | Admitting: Physical Therapy

## 2019-08-07 ENCOUNTER — Encounter: Payer: PRIVATE HEALTH INSURANCE | Admitting: Physical Therapy

## 2019-09-12 IMAGING — CR DG ABDOMEN 1V
1 series · 2 of 2 positions shown · non-contrast
Comparison: 10/19/2016.  CT 09/09/2016.

CLINICAL DATA: Follow up left-sided kidney stones.

EXAM:
ABDOMEN - 1 VIEW

[Series 1: dg abd 1 view · 0.14mm/px · 2 of 2 slices shown]
[im 1/2]
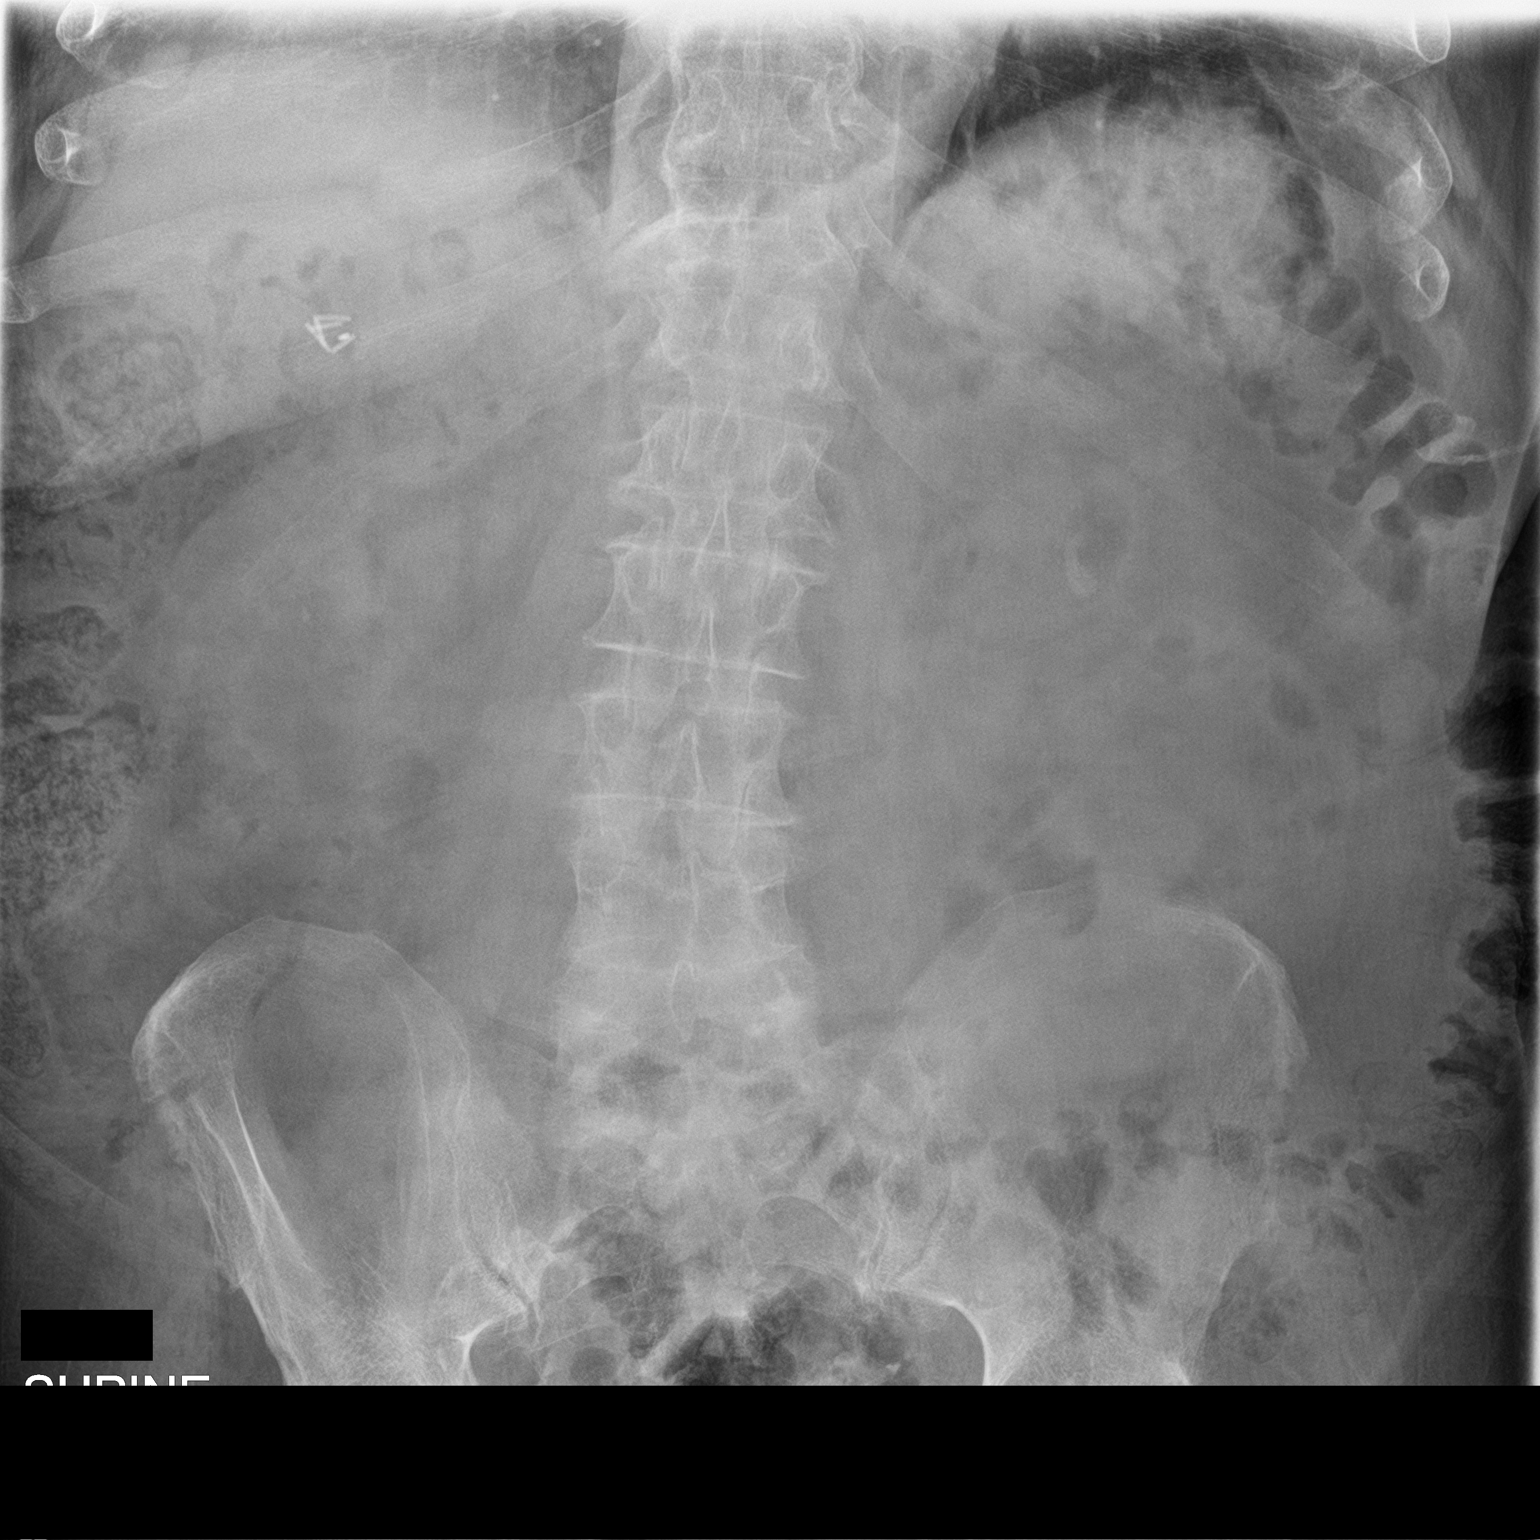
[im 2/2]
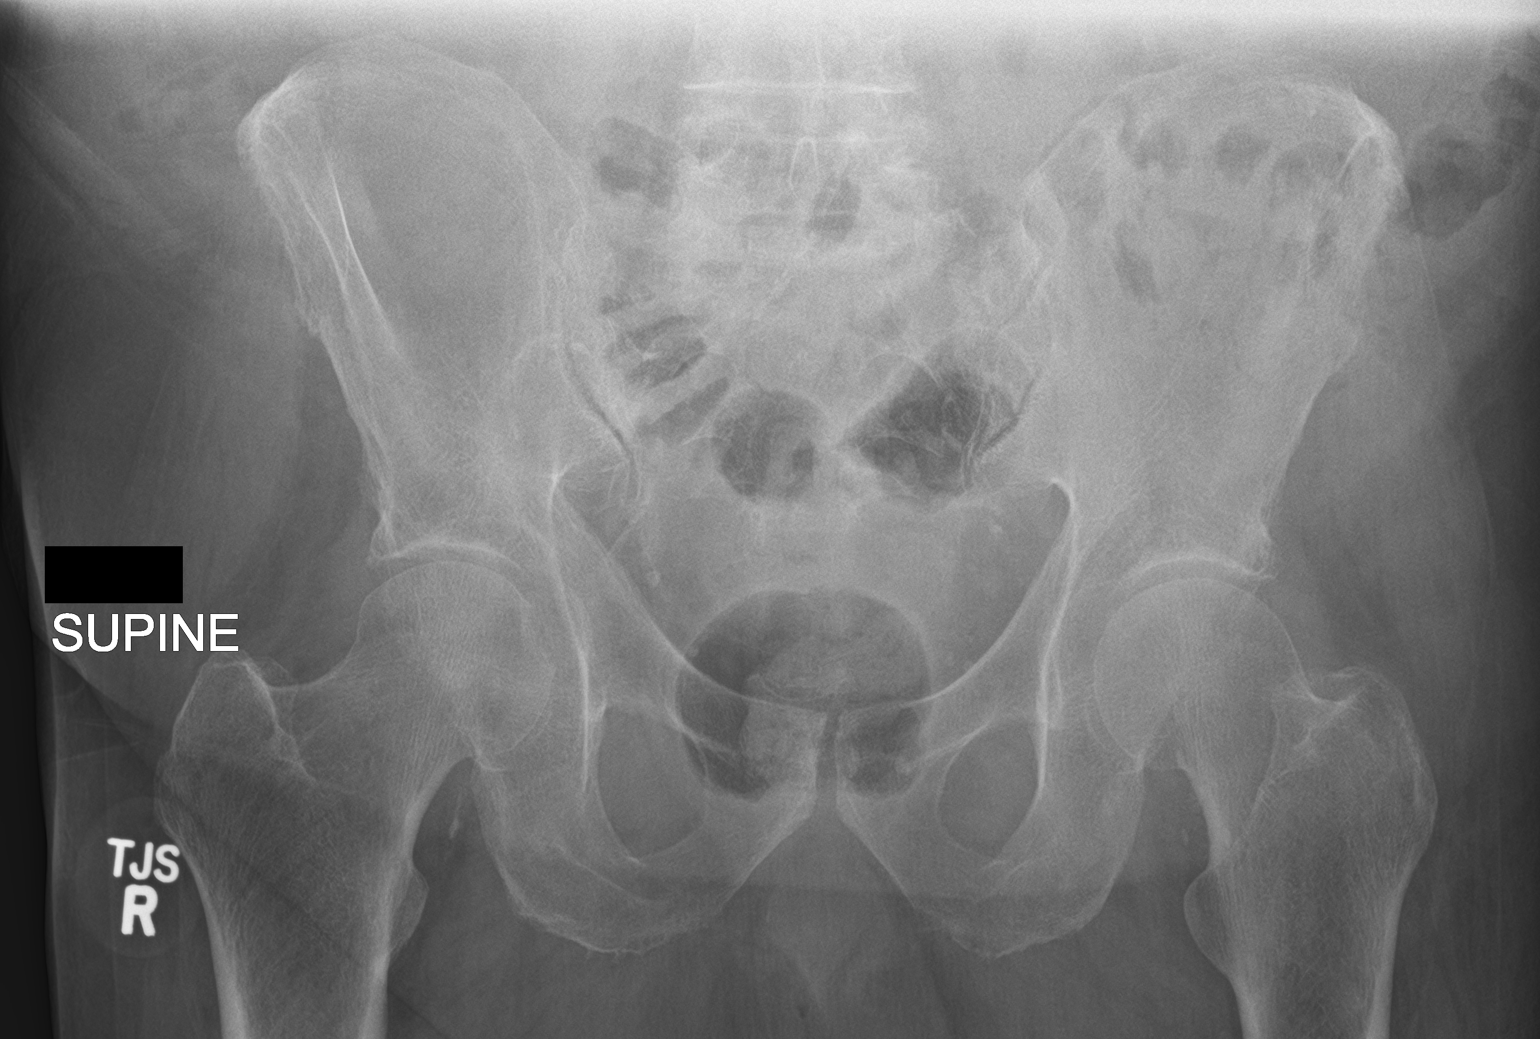

[2 of 2 positions shown; findings below may reference images not displayed]

FINDINGS: Previously identified approximately 10 mm proximal left ureteral
stone appears to be within the left kidney on today's exam.
Previously identified distal left ureteral stone is not identified
on today's exam. Stable tiny calcific density of the right kidney is
again noted consistent with small right renal stone. Stable pelvic
calcifications are again noted consistent phleboliths. Peripheral
vascular calcification. No bowel distention. Stool noted throughout
the colon. Lumbar spine scoliosis and degenerative change.
IMPRESSION: 1. Previously identified proximal left ureteral proximally 10 mm
stones noted projected over the left kidney on today's exam.
Previously identified left mid ureteral stone no longer identified.

2.  Stable tiny right renal stone.

## 2020-01-07 NOTE — Telephone Encounter (Signed)
Per Dr. Apolinar Junes last note  This is unchanged and almost 2 years, as such, will follow clinically at this point Continue to encourage fluids

## 2020-01-14 NOTE — Progress Notes (Signed)
01/15/2020 10:48 AM   Bradley [name] 04-Jul-1935 [id number]  Referring provider: Derinda Late, MD [phone] [address] [facility] [place],  [place] [address]  Chief Complaint  Patient presents with  . Other    HPI: 84 year old male who with BPH with LU TS and nephrolithiasis who presents today for yearly follow up.    BPH WITH LUTS  (prostate and/or bladder) IPSS score: 6/2   Previous PVR: 17 mL  Major complaint(s): None.  Denies any dysuria, hematuria or suprapubic pain.   Currently taking: finasteride 5 mg daily   His had had HoLEP 2018   Denies any recent fevers, chills, nausea or vomiting.   IPSS    Row Name 01/15/20 1000         International Prostate Symptom Score   How often have you had the sensation of not emptying your bladder? Less than 1 in 5     How often have you had to urinate less than every two hours? Less than 1 in 5 times     How often have you found you stopped and started again several times when you urinated? Not at All     How often have you found it difficult to postpone urination? Less than 1 in 5 times     How often have you had a weak urinary stream? Less than 1 in 5 times     How often have you had to strain to start urination? Not at All     How many times did you typically get up at night to urinate? 2 Times     Total IPSS Score 6       Quality of Life due to urinary symptoms   If you were to spend the rest of your life with your urinary condition just the way it is now how would you feel about that? Mostly Satisfied            Score:  1-7 Mild 8-19 Moderate 20-35 Severe  Nephrolithiasis He has a personal history of obstructing left ureteral calculi x2 and underwent staged ureteroscopy in 10/2016.  He has a 1 cm left nonobstructing stone.  He has not had any flank pain or passage of fragments.   PMH: Past Medical History:  Diagnosis Date  . Anemia   . BPH (benign prostatic hypertrophy)    . Diabetes type 2, controlled ([place])   . GERD (gastroesophageal reflux disease)    RARE  . Glaucoma    Syndor  . Hearing loss    bilateral hearing aids occasionally  . History of kidney stones   . HLD (hyperlipidemia)   . HTN (hypertension)   . Macular degeneration    Right ([place])  . Osteoarthritis    thumbs, hips, knees (s/p R TKR)  . PUD (peptic ulcer disease) 03/2013   2 antral gastric ulcers and 1 duodenal ulcer, NSAID related, admitted to [facility] with melena/anemia s/p EGD  . Sleep apnea    CPAP  . Tremor    intention    Surgical History: Past Surgical History:  Procedure Laterality Date  . [place]  2012  . carotid US  12/2011   no significant stenosis  . CATARACT EXTRACTION Bilateral 2011, 2012  . CHOLECYSTECTOMY  1997  . COLONOSCOPY  2009   per patient, told no longer needed  . CYSTOSCOPY W/ URETERAL STENT PLACEMENT Left 11/01/2016   Procedure: CYSTOSCOPY WITH STENT REPLACEMENT;  Surgeon: Hollice Espy, MD;  Location: [facility] ORS;  Service: Urology;  Laterality: Left;  . CYSTOSCOPY WITH STENT PLACEMENT Left 10/26/2016   Procedure: CYSTOSCOPY WITH STENT PLACEMENT;  Surgeon: [name], MD;  Location: [facility];  Service: Urology;  Laterality: Left;  . ESOPHAGOGASTRODUODENOSCOPY  04/2013   hospitalization @ [facility], gastritis, gastric ulcers and duodenal ulcer with clean base ([place])  . HOLEP-LASER ENUCLEATION OF THE PROSTATE WITH MORCELLATION N/A 11/01/2016   Procedure: HOLEP-LASER ENUCLEATION OF THE PROSTATE WITH MORCELLATION;  Surgeon: [name], MD;  Location: [facility] OR[facility];  Service: Urology;  Laterality: N/A;  . KIDNEY STONE SURGERY  85, 87, 90  . TOTAL KNEE ARTHROPLASTY Right 2006  . URETEROSCOPY Left 10/26/2016   Procedure: URETEROSCOPY;  Surgeon: [name], MD;  Location: [facility] OR[facility];  Service: Urology;  Laterality: Left;  . URETEROSCOPY WITH HOLMIUM LASER LITHOTRIPSY Left 11/01/2016   Procedure: URETEROSCOPY WITH HOLMIUM LASER LITHOTRIPSY;   Surgeon: [name], MD;  Location: [facility];  Service: Urology;  Laterality: Left;  . US ECHOCARDIOGRAPHY  12/2011   nl LV fxn, EF 60%, nl valves, dilated aortic root (64mm)    Home Medications:  Allergies as of 01/15/2020      Reactions   Sulfa Antibiotics    As infant   Penicillins Rash      Medication List       Accurate as of January 15, 2020 10:48 AM. If you have any questions, ask your nurse or doctor.        acetaminophen 500 MG tablet Commonly known as: TYLENOL Take 1,000 mg by mouth 2 (two) times daily.   atorvastatin 20 MG tablet Commonly known as: LIPITOR Take 20 mg by mouth every evening.   carbidopa-levodopa 25-100 MG tablet Commonly known as: SINEMET IR   Combigan 0.2-0.5 % ophthalmic solution Generic drug: brimonidine-timolol Place 1 drop into both eyes 2 (two) times daily.   finasteride 5 MG tablet Commonly known as: PROSCAR Take 5 mg by mouth every morning.   glimepiride 4 MG tablet Commonly known as: AMARYL Take 8 mg by mouth daily before breakfast.   HAIR/SKIN/NAILS PO Take 1 tablet by mouth 2 (two) times daily.   MELATONIN PO Take by mouth daily.   metFORMIN 500 MG 24 hr tablet Commonly known as: GLUCOPHAGE-XR Take 500 mg by mouth 2 (two) times daily.   PRESERVISION AREDS 2 PO Take 1 tablet by mouth 2 (two) times daily.   sertraline 25 MG tablet Commonly known as: ZOLOFT 1 tablet in the morning for 4 weeks, Then 2 tabs in morning thereafter   valsartan-hydrochlorothiazide 160-25 MG tablet Commonly known as: DIOVAN-HCT Take by mouth every morning. Takes 1/2 tablet by mouth daily       Allergies:  Allergies  Allergen Reactions  . Sulfa Antibiotics     As infant  . Penicillins Rash    Family History: Family History  Problem Relation Age of Onset  . Cancer Sister        lung, nonsmoker  . Cancer Sister        lung, nonsmoker  . Dementia Mother   . CAD Father        "heart problems"  . CAD Brother 57       MI  .  Stroke Neg Hx   . Prostate cancer Neg Hx   . Bladder Cancer Neg Hx   . Kidney cancer Neg Hx     Social History:  reports that he quit smoking about 21 years ago. His smoking use included cigarettes and  cigars. He started smoking about 21 years ago. He has a 45.00 pack-year smoking history. He has never used smokeless tobacco. He reports current alcohol use. He reports that he does not use drugs.  ROS: For pertinent review of systems please refer to history of present illness  Physical Exam: BP 130/83   Pulse 67   Ht $R'5\' 8"'Ij$  (1.727 m)   Wt 200 lb (90.7 kg)   BMI 30.41 kg/m   Constitutional:  Well nourished. Alert and oriented, No acute distress. HEENT: [place] AT, mask in place.  Trachea midline Cardiovascular: No clubbing, cyanosis, or edema. Respiratory: Normal respiratory effort, no increased work of breathing. Neurologic: Grossly intact, no focal deficits, moving all 4 extremities. Psychiatric: Normal mood and affect.  Laboratory Data: Glucose 70 - 110 mg/dL 170High      Sodium 136 - 145 mmol/L 141      Potassium 3.6 - 5.1 mmol/L 4.3      Chloride 97 - 109 mmol/L 104      Carbon Dioxide (CO2) 22.0 - 32.0 mmol/L 32.2High      Urea Nitrogen (BUN) 7 - 25 mg/dL 20      Creatinine 0.7 - 1.3 mg/dL 0.9      Glomerular Filtration Rate (eGFR), MDRD Estimate >60 mL/min/1.73sq m 81      Calcium 8.7 - 10.3 mg/dL 9.4      AST  8 - 39 U/L 23      ALT  6 - 57 U/L 10      Alk Phos (alkaline Phosphatase) 34 - 104 U/L 53      Albumin 3.5 - 4.8 g/dL 3.9      Bilirubin, Total 0.3 - 1.2 mg/dL 0.9      Protein, Total 6.1 - 7.9 g/dL 6.4      A/G Ratio 1.0 - 5.0 gm/dL 1.6      Resulting Agency  [facility] - [facility]  Specimen Collected: 12/21/19 8:27 AM Last Resulted: 12/21/19 12:59 PM  Received From: [place]  Result Received: 01/07/20 9:09 AM    Related to PSA, Total (Screen) Component 12/21/19 06/22/19  PSA (Prostate Specific Antigen), Total  1.63 1.47    Component     Latest Ref Rng & Units 10/19/2016  Prostate Specific Ag, Serum     0.0 - 4.0 ng/mL 1.4   Lab Results  Component Value Date   PSA 3.14 02/28/2013   PSA 2.9 12/01/2011    Urinalysis    Component Value Date/Time   COLORURINE Yellow 04/22/2013 1032   APPEARANCEUR Clear 12/03/2016 1115   LABSPEC 1.020 04/22/2013 1032   PHURINE 5.0 04/22/2013 1032   GLUCOSEU Negative 12/03/2016 1115   GLUCOSEU Negative 04/22/2013 1032   HGBUR Negative 04/22/2013 1032   BILIRUBINUR Negative 12/03/2016 1115   BILIRUBINUR Negative 04/22/2013 1032   KETONESUR Negative 04/22/2013 1032   PROTEINUR 2+ (A) 12/03/2016 1115   PROTEINUR Negative 04/22/2013 1032   NITRITE Negative 12/03/2016 1115   NITRITE Negative 04/22/2013 1032   LEUKOCYTESUR 1+ (A) 12/03/2016 1115   LEUKOCYTESUR Negative 04/22/2013 1032    Lab Results  Component Value Date   LABMICR See below: 12/03/2016   WBCUA 11-30 (A) 12/03/2016   RBCUA >30R 12/03/2016   LABEPIT 0-10 12/03/2016   BACTERIA Few (A) 12/03/2016  I have reviewed the labs.  Pertinent Imaging: Results for orders placed during the hospital encounter of 01/04/19  DG Abd 1 View   Narrative CLINICAL DATA:  Kidney stones history of ureteroscopy  with stent in 2018  EXAM: ABDOMEN - 1 VIEW  COMPARISON:  01/02/2018, 07/05/2017  FINDINGS: Surgical clips in the right upper quadrant. Nonobstructive bowel gas pattern. 15 mm calcification projecting over the left kidney. This does not appear significantly changed.  IMPRESSION: 1. No significant change in 15 mm calcification projecting over left kidney. 2. Nonobstructed gas pattern   Electronically Signed   By: Donavan Foil M.D.   On: 01/04/2019 14:01      Assessment & Plan:    1. Kidney stones Asymptomatic stable left 10 mm stone This is unchanged and almost 2 years, as such, will follow clinically at this point Continue to encourage fluids  2. Benign prostatic hyperplasia  with urinary obstruction Overall doing extremely well in terms of urinary symptoms and overall health He believes that his weight loss and physical activity have improved his overall wellbeing No longer taking Flomax, continue to abstain from this medication as is not needed Would recommend continue finasteride for the time being in the setting of prostamegaly   Return in about 1 year (around 01/14/2021) for I PSS .  Zara Council, PA-C  [facility] [address] [place], [place] [address] [phone]

## 2020-01-15 ENCOUNTER — Other Ambulatory Visit: Payer: Self-pay

## 2020-01-15 ENCOUNTER — Ambulatory Visit (INDEPENDENT_AMBULATORY_CARE_PROVIDER_SITE_OTHER): Payer: Medicare Other | Admitting: Urology

## 2020-01-15 ENCOUNTER — Encounter: Payer: Self-pay | Admitting: Urology

## 2020-01-15 VITALS — BP 130/83 | HR 67 | Ht 68.0 in | Wt 200.0 lb

## 2020-01-15 DIAGNOSIS — N200 Calculus of kidney: Secondary | ICD-10-CM

## 2020-01-15 DIAGNOSIS — N401 Enlarged prostate with lower urinary tract symptoms: Secondary | ICD-10-CM | POA: Diagnosis not present

## 2020-01-15 DIAGNOSIS — N138 Other obstructive and reflux uropathy: Secondary | ICD-10-CM | POA: Diagnosis not present

## 2020-03-11 IMAGING — CR DG ABDOMEN 1V
1 series · 2 of 2 positions shown · non-contrast
Comparison: July 05, 2017

CLINICAL DATA: History of kidney stones.

EXAM:
ABDOMEN - 1 VIEW

[Series 1: dg abd 1 view · 0.14mm/px · 2 of 2 slices shown]
[im 1/2]
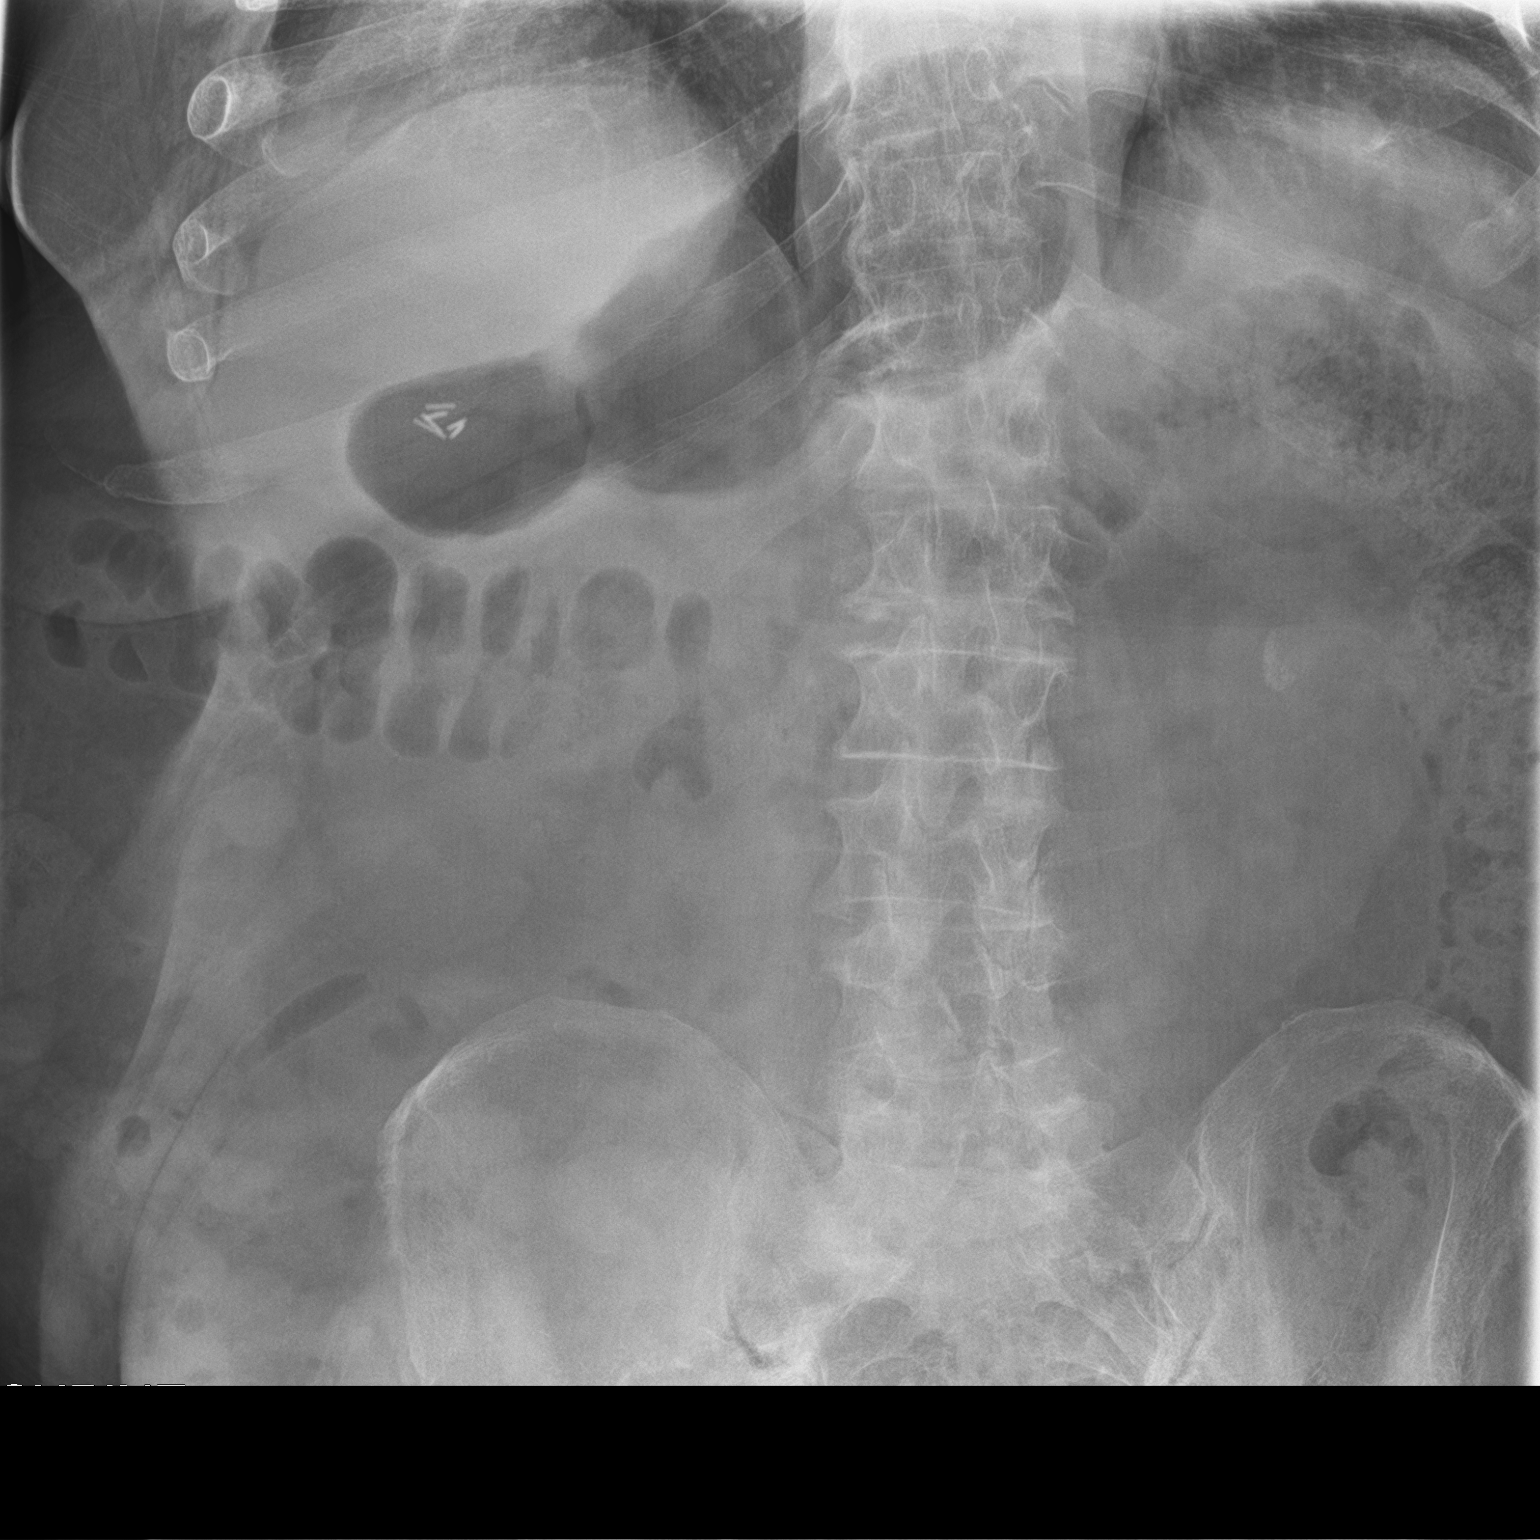
[im 2/2]
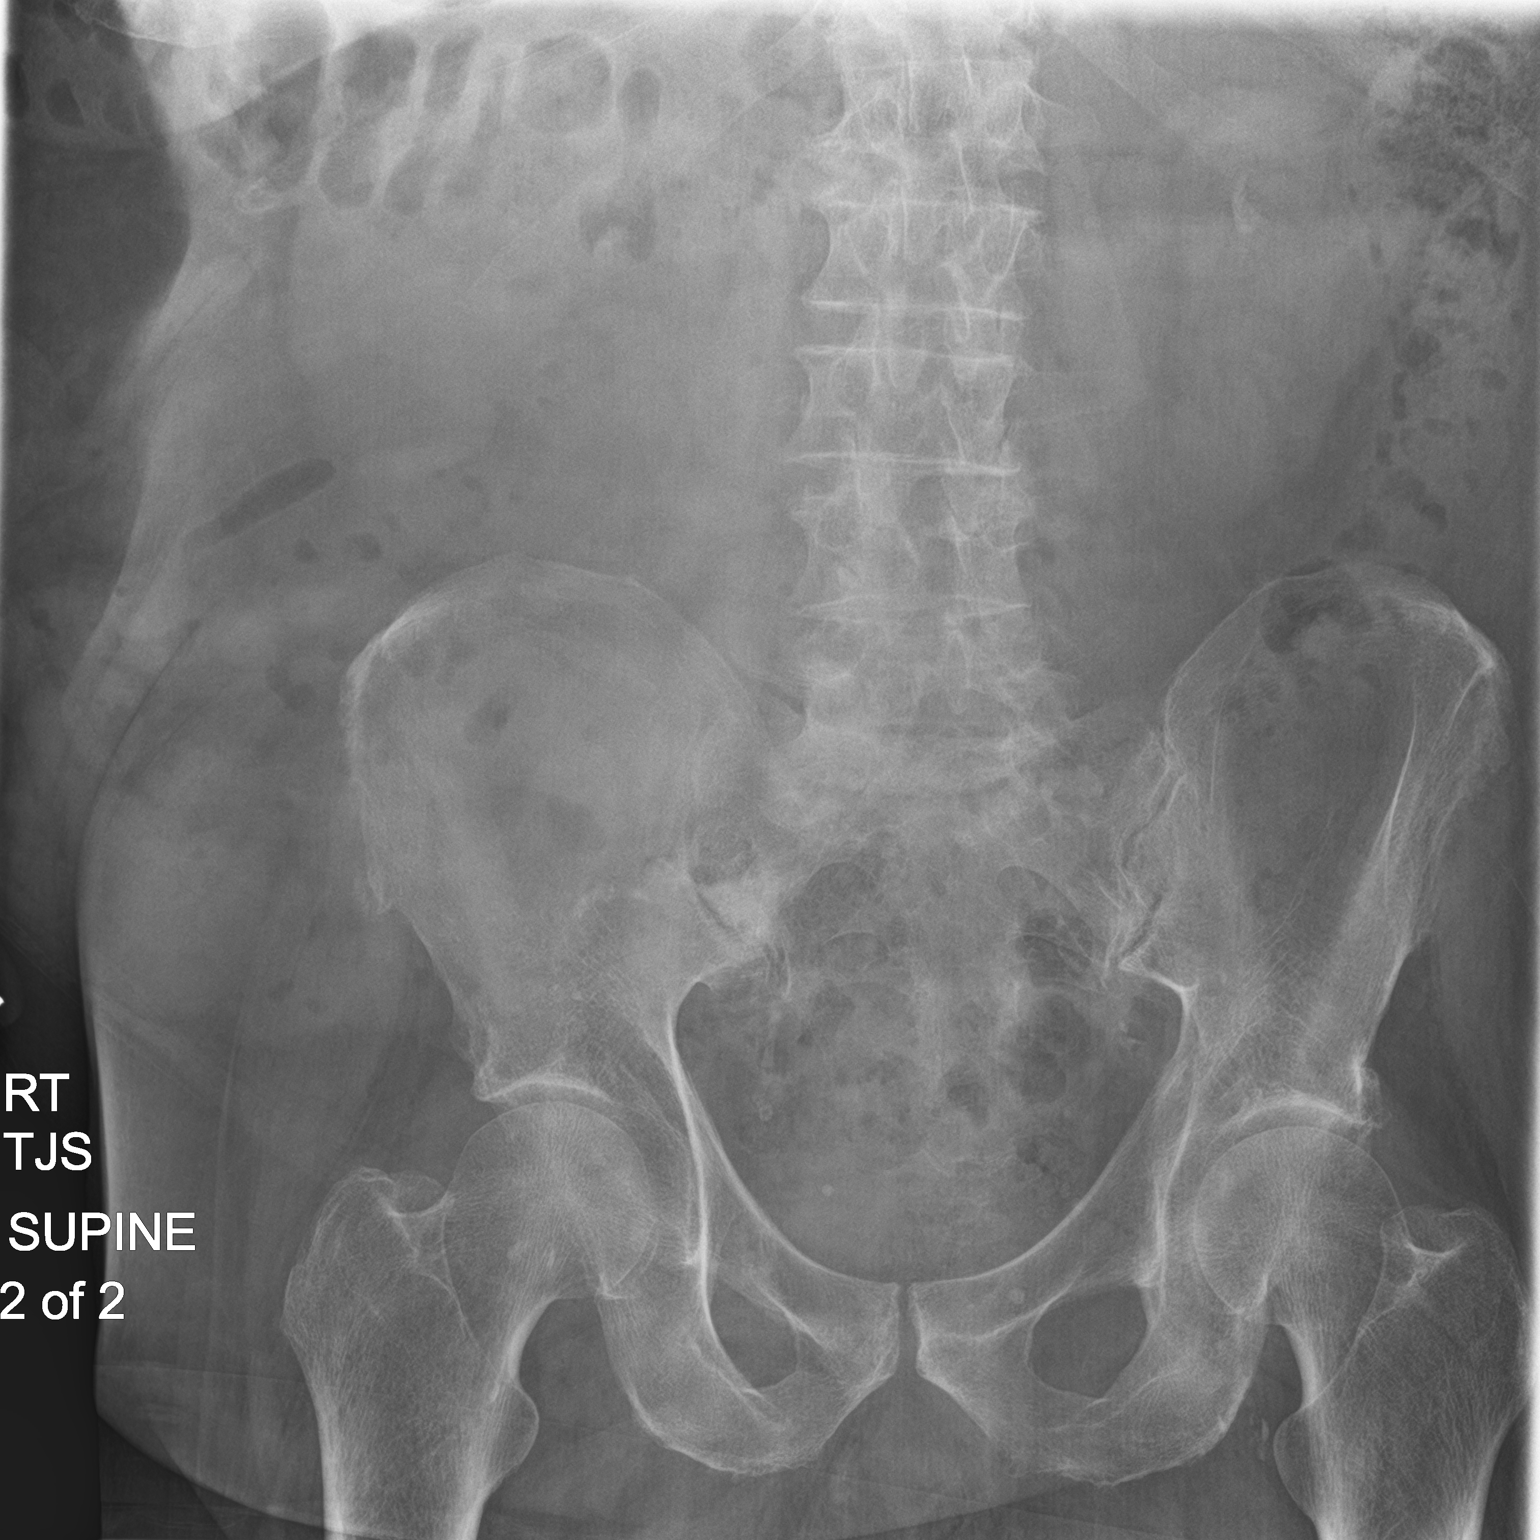

[2 of 2 positions shown; findings below may reference images not displayed]

FINDINGS: The bowel gas pattern is normal. 1 cm stone is identified in the
left kidney unchanged. The previously noted right kidney stone is
not as well seen. Patient status post prior cholecystectomy.
Moderate bowel content is identified throughout colon. Degenerative
joint changes of the spine are noted.
IMPRESSION: 1 cm stone is identified in the left kidney unchanged. Previously
noted small right kidney stone is not as well seen on today's exam.

No bowel obstruction.  Constipation.

## 2021-01-14 NOTE — Progress Notes (Signed)
01/15/2021 8:38 AM   Bradley [name] 10/04/1935 [id number]  Referring provider: Derinda Late, MD [phone] [address] [facility] [place],  [place] [address]  Urological history: 1.  Nephrolithiasis -KUB 2020-15 mm calcification projecting over left kidney -hx of ESWL and URS in the past  2. BPH with LU TS -I PSS 1/1 -PVR 0 mL -HoLEP 2018-pathology negative for malignancy -managed with finasteride (Proscar) 5 mg daily  Chief Complaint  Patient presents with   Other    HPI: Bradley [name] is a 85 y.o. male who presents today for yearly visit.  He has no urinary complaints.  Patient denies any modifying or aggravating factors.  Patient denies any gross hematuria, dysuria or suprapubic/flank pain.  Patient denies any fevers, chills, nausea or vomiting.      IPSS     Row Name 01/15/21 0800         International Prostate Symptom Score   How often have you had the sensation of not emptying your bladder? Not at All     How often have you had to urinate less than every two hours? Not at All     How often have you found you stopped and started again several times when you urinated? Not at All     How often have you found it difficult to postpone urination? Not at All     How often have you had a weak urinary stream? Not at All     How often have you had to strain to start urination? Not at All     How many times did you typically get up at night to urinate? 1 Time     Total IPSS Score 1           Quality of Life due to urinary symptoms   If you were to spend the rest of your life with your urinary condition just the way it is now how would you feel about that? Pleased               Score:  1-7 Mild 8-19 Moderate 20-35 Severe   PMH: Past Medical History:  Diagnosis Date   Anemia    BPH (benign prostatic hypertrophy)    Diabetes type 2, controlled ([facility])    GERD (gastroesophageal reflux disease)    RARE    Glaucoma    Syndor   Hearing loss    bilateral hearing aids occasionally   History of kidney stones    HLD (hyperlipidemia)    HTN (hypertension)    Macular degeneration    Right ([place])   Osteoarthritis    thumbs, hips, knees (s/p R TKR)   PUD (peptic ulcer disease) 03/2013   2 antral gastric ulcers and 1 duodenal ulcer, NSAID related, admitted to [facility] with melena/anemia s/p EGD   Sleep apnea    CPAP   Tremor    intention    Surgical History: Past Surgical History:  Procedure Laterality Date   BELPHAROPTOSIS REPAIR  2012   carotid US  12/2011   no significant stenosis   CATARACT EXTRACTION Bilateral 2011, 2012   [place]  2009   per patient, told no longer needed   CYSTOSCOPY W/ URETERAL STENT PLACEMENT Left 11/01/2016   Procedure: CYSTOSCOPY WITH STENT REPLACEMENT;  Surgeon: Hollice Espy, MD;  Location: [facility] OR[facility];  Service: Urology;  Laterality: Left;   CYSTOSCOPY WITH STENT PLACEMENT Left 10/26/2016   Procedure: CYSTOSCOPY WITH  STENT PLACEMENT;  Surgeon: Hollice Espy, MD;  Location: [facility];  Service: Urology;  Laterality: Left;   ESOPHAGOGASTRODUODENOSCOPY  04/2013   hospitalization @ [place], gastritis, gastric ulcers and duodenal ulcer with clean base ([place])   HOLEP-LASER ENUCLEATION OF THE PROSTATE WITH MORCELLATION N/A 11/01/2016   Procedure: HOLEP-LASER ENUCLEATION OF THE PROSTATE WITH MORCELLATION;  Surgeon: Hollice Espy, MD;  Location: [facility] OR[facility];  Service: Urology;  Laterality: N/A;   KIDNEY STONE SURGERY  85, 89, 89   TOTAL KNEE ARTHROPLASTY Right 2006   URETEROSCOPY Left 10/26/2016   Procedure: URETEROSCOPY;  Surgeon: Hollice Espy, MD;  Location: [facility];  Service: Urology;  Laterality: Left;   URETEROSCOPY WITH HOLMIUM LASER LITHOTRIPSY Left 11/01/2016   Procedure: URETEROSCOPY WITH HOLMIUM LASER LITHOTRIPSY;  Surgeon: Hollice Espy, MD;  Location: [facility];  Service: Urology;  Laterality: Left;   US ECHOCARDIOGRAPHY  12/2011    nl LV fxn, EF 60%, nl valves, dilated aortic root (43m)    Home Medications:  Allergies as of 01/15/2021       Reactions   Sulfa Antibiotics    As infant   Penicillins Rash        Medication List        Accurate as of January 15, 2021  8:38 AM. If you have any questions, ask your nurse or doctor.          acetaminophen 500 MG tablet Commonly known as: TYLENOL Take 1,000 mg by mouth 2 (two) times daily.   atorvastatin 20 MG tablet Commonly known as: LIPITOR Take 20 mg by mouth every evening.   brimonidine-timolol 0.2-0.5 % ophthalmic solution Commonly known as: COMBIGAN Place 1 drop into both eyes 2 (two) times daily.   carbidopa-levodopa 25-100 MG tablet Commonly known as: SINEMET IR   finasteride 5 MG tablet Commonly known as: PROSCAR Take 5 mg by mouth every morning.   glimepiride 4 MG tablet Commonly known as: AMARYL Take 8 mg by mouth daily before breakfast.   HAIR/SKIN/NAILS PO Take 1 tablet by mouth 2 (two) times daily.   MELATONIN PO Take by mouth daily.   metFORMIN 500 MG 24 hr tablet Commonly known as: GLUCOPHAGE-XR Take 500 mg by mouth 2 (two) times daily.   PRESERVISION AREDS 2 PO Take 1 tablet by mouth 2 (two) times daily.   sertraline 25 MG tablet Commonly known as: ZOLOFT 1 tablet in the morning for 4 weeks, Then 2 tabs in morning thereafter   valsartan-hydrochlorothiazide 160-25 MG tablet Commonly known as: DIOVAN-HCT Take by mouth every morning. Takes 1/2 tablet by mouth daily        Allergies:  Allergies  Allergen Reactions   Sulfa Antibiotics     As infant   Penicillins Rash    Family History: Family History  Problem Relation Age of Onset   Cancer Sister        lung, nonsmoker   Cancer Sister        lung, nonsmoker   Dementia Mother    CAD Father        "heart problems"   CAD Brother 645      MI   Stroke Neg Hx    Prostate cancer Neg Hx    Bladder Cancer Neg Hx    Kidney cancer Neg Hx     Social  History:  reports that he quit smoking about 22 years ago. His smoking use included cigarettes and cigars. He started smoking about 22 years ago. He has a 45.00 pack-year  smoking history. He has never used smokeless tobacco. He reports current alcohol use. He reports that he does not use drugs.  ROS: For pertinent review of systems please refer to history of present illness  Physical Exam: BP 130/70   Pulse 70   Ht _0  (1.676 m)   Wt 175 lb (79.4 kg)   BMI 28.25 kg/m   Constitutional:  Well nourished. Alert and oriented, No acute distress. HEENT: [place] AT, mask in place.  Trachea midline Cardiovascular: No clubbing, cyanosis, or edema. Respiratory: Normal respiratory effort, no increased work of breathing. Neurologic: Grossly intact, no focal deficits, moving all 4 extremities. Psychiatric: Normal mood and affect.   Laboratory Data: WBC (White Blood Cell Count) 4.1 - 10.2 10^3/uL 7.2   RBC (Red Blood Cell Count) 4.69 - 6.13 10^6/uL 4.39 Low    Hemoglobin 14.1 - 18.1 gm/dL 13.8 Low    Hematocrit 40.0 - 52.0 % 43.0   MCV (Mean Corpuscular Volume) 80.0 - 100.0 fl 97.9   MCH (Mean Corpuscular Hemoglobin) 27.0 - 31.2 pg 31.4 High    MCHC (Mean Corpuscular Hemoglobin Concentration) 32.0 - 36.0 gm/dL 32.1   Platelet Count 150 - 450 10^3/uL 210   RDW-CV (Red Cell Distribution Width) 11.6 - 14.8 % 12.8   MPV (Mean Platelet Volume) 9.4 - 12.4 fl 10.6   Neutrophils 1.50 - 7.80 10^3/uL 4.60   Lymphocytes 1.00 - 3.60 10^3/uL 2.00   Mixed Count 0.10 - 0.90 10^3/uL 0.60   Neutrophil % 32.0 - 70.0 % 63.5   Lymphocyte % 10.0 - 50.0 % 27.5   Mixed % 3.0 - 14.4 % 9.0   Resulting Agency  [facility] - [facility]  Specimen Collected: 11/07/20 08:04 Last Resulted: 11/07/20 10:14  Received From: [place]  Result Received: 01/14/21 08:05   Hemoglobin A1C 4.2 - 5.6 % 7.9 High    Average Blood Glucose (Calc) mg/dL 180   Resulting Agency  [facility] - LAB   Narrative Performed by [facility] - LAB Normal Range:    4.2 - 5.6%  Increased Risk:  5.7 - 6.4%  Diabetes:        >= 6.5%  Glycemic Control for adults with diabetes:  <7%   Specimen Collected: 11/07/20 08:04 Last Resulted: 11/07/20 12:01  Received From: [facility]  Result Received: 01/14/21 08:05   Glucose 70 - 110 mg/dL 194 High    Sodium 136 - 145 mmol/L 140   Potassium 3.6 - 5.1 mmol/L 4.4   Chloride 97 - 109 mmol/L 101   Carbon Dioxide (CO2) 22.0 - 32.0 mmol/L 32.7 High    Urea Nitrogen (BUN) 7 - 25 mg/dL 18   Creatinine 0.7 - 1.3 mg/dL 0.9   Glomerular Filtration Rate (eGFR), MDRD Estimate >60 mL/min/1.73sq m 81   Calcium 8.7 - 10.3 mg/dL 9.4   AST  8 - 39 U/L 34   ALT  6 - 57 U/L 14   Alk Phos (alkaline Phosphatase) 34 - 104 U/L 59   Albumin 3.5 - 4.8 g/dL 4.1   Bilirubin, Total 0.3 - 1.2 mg/dL 1.1   Protein, Total 6.1 - 7.9 g/dL 6.7   A/G Ratio 1.0 - 5.0 gm/dL 1.6   Resulting Agency  [facility] - LAB  Specimen Collected: 11/07/20 08:04 Last Resulted: 11/07/20 12:08  Received From: [place]  Result Received: 01/14/21 08:05   Color Yellow, Violet, Light Violet, Dark Violet Yellow   Clarity Clear  Clear   Specific Gravity 1.000 - 1.030 1.025   pH, Urine 5.0 - 8.0 5.0   Protein, Urinalysis Negative, Trace mg/dL Negative   Glucose, Urinalysis Negative mg/dL Negative   Ketones, Urinalysis Negative mg/dL Trace Abnormal    Blood, Urinalysis Negative Negative   Nitrite, Urinalysis Negative Negative   Leukocyte Esterase, Urinalysis Negative Negative   White Blood Cells, Urinalysis None Seen, 0-3 /hpf 0-3   Red Blood Cells, Urinalysis None Seen, 0-3 /hpf None Seen   Bacteria, Urinalysis None Seen /hpf None Seen   Squamous Epithelial Cells, Urinalysis Rare, Few, None Seen /hpf Rare   Resulting Agency  [facility] - LAB  Narrative Performed by [facility] - LAB Moderate mucus strands  Specimen  Collected: 10/01/20 14:41 Last Resulted: 10/01/20 17:41  Received From: [place]  Result Received: 01/14/21 08:05  I have reviewed the labs.  Pertinent Imaging: Component     Latest Ref Rng & Units 01/15/2021  [facility] Result      0     Assessment & Plan:    1. Kidney stones -asymptomatic   2. Benign prostatic hyperplasia with urinary obstruction -continue finasteride 5 mg daily   Return in about 1 year (around 01/15/2022) for I PSS and PVR .  Zara Council, PA-C  [facility] [address], [place], [place] [address] [phone]

## 2021-01-15 ENCOUNTER — Encounter: Payer: Self-pay | Admitting: Urology

## 2021-01-15 ENCOUNTER — Ambulatory Visit (INDEPENDENT_AMBULATORY_CARE_PROVIDER_SITE_OTHER): Payer: Medicare Other | Admitting: Urology

## 2021-01-15 ENCOUNTER — Other Ambulatory Visit: Payer: Self-pay

## 2021-01-15 VITALS — BP 130/70 | HR 70 | Ht 66.0 in | Wt 175.0 lb

## 2021-01-15 DIAGNOSIS — N200 Calculus of kidney: Secondary | ICD-10-CM

## 2021-01-15 DIAGNOSIS — N138 Other obstructive and reflux uropathy: Secondary | ICD-10-CM | POA: Diagnosis not present

## 2021-01-15 DIAGNOSIS — N401 Enlarged prostate with lower urinary tract symptoms: Secondary | ICD-10-CM | POA: Diagnosis not present

## 2021-01-15 LAB — BLADDER SCAN AMB NON-IMAGING: SCA Result: 0

## 2021-10-08 ENCOUNTER — Other Ambulatory Visit: Payer: Self-pay | Admitting: Family Medicine

## 2021-10-08 DIAGNOSIS — R911 Solitary pulmonary nodule: Secondary | ICD-10-CM

## 2021-10-16 ENCOUNTER — Ambulatory Visit
Admission: RE | Admit: 2021-10-16 | Discharge: 2021-10-16 | Disposition: A | Discharge status: Home / Self Care | Payer: Medicare Other | Source: Ambulatory Visit | Attending: Family Medicine | Admitting: Family Medicine

## 2021-10-16 DIAGNOSIS — R911 Solitary pulmonary nodule: Secondary | ICD-10-CM | POA: Insufficient documentation

## 2022-01-15 ENCOUNTER — Encounter: Payer: Self-pay | Admitting: Urology

## 2022-01-15 ENCOUNTER — Ambulatory Visit (INDEPENDENT_AMBULATORY_CARE_PROVIDER_SITE_OTHER): Payer: Medicare Other | Admitting: Urology

## 2022-01-15 VITALS — BP 136/80 | HR 68 | Ht 66.0 in | Wt 173.0 lb

## 2022-01-15 DIAGNOSIS — M899 Disorder of bone, unspecified: Secondary | ICD-10-CM

## 2022-01-15 DIAGNOSIS — N401 Enlarged prostate with lower urinary tract symptoms: Secondary | ICD-10-CM

## 2022-01-15 DIAGNOSIS — N200 Calculus of kidney: Secondary | ICD-10-CM

## 2022-01-15 DIAGNOSIS — N138 Other obstructive and reflux uropathy: Secondary | ICD-10-CM

## 2022-01-15 LAB — BLADDER SCAN AMB NON-IMAGING

## 2022-01-15 NOTE — Progress Notes (Signed)
01/15/2022 10:13 AM   Bradley [name] April 07, 1936 [id number]  Referring provider: Derinda Late, MD [phone] [facility] [place],  [place] [address]  Urological history: 1.  Nephrolithiasis -KUB 2020-15 mm calcification projecting over left kidney -hx of ESWL and URS in the past  2. BPH with LU TS -I PSS 1/1 -PVR 0 mL -HoLEP 2018-pathology negative for malignancy -managed with finasteride (Proscar) 5 mg daily  Chief Complaint  Patient presents with   Benign Prostatic Hypertrophy   Nephrolithiasis    HPI: Bradley [name] is a 86 y.o. male who presents today for yearly visit.  He has no urinary complaints at this visit.  He continues to take the finasteride 5 mg daily.  Patient denies any modifying or aggravating factors.  Patient denies any gross hematuria, dysuria or suprapubic/flank pain.  Patient denies any fevers, chills, nausea or vomiting.    He suffered a fall back in May.  In the ED, he underwent a chest CT which had incidental finding of a ovoid sclerotic lesion within the T6 vertebral body posteriorly.   Last PSA was in 2021 - 1.63    IPSS     Row Name 01/15/22 0900         International Prostate Symptom Score   How often have you had the sensation of not emptying your bladder? Less than 1 in 5     How often have you had to urinate less than every two hours? Less than 1 in 5 times     How often have you found you stopped and started again several times when you urinated? Not at All     How often have you found it difficult to postpone urination? Not at All     How often have you had a weak urinary stream? Not at All     How often have you had to strain to start urination? Not at All     How many times did you typically get up at night to urinate? 1 Time     Total IPSS Score 3       Quality of Life due to urinary symptoms   If you were to spend the rest of your life with your urinary condition just the  way it is now how would you feel about that? Pleased               Score:  1-7 Mild 8-19 Moderate 20-35 Severe   PMH: Past Medical History:  Diagnosis Date   Anemia    BPH (benign prostatic hypertrophy)    Diabetes type 2, controlled (HCC)    GERD (gastroesophageal reflux disease)    RARE   Glaucoma    Syndor   Hearing loss    bilateral hearing aids occasionally   History of kidney stones    HLD (hyperlipidemia)    HTN (hypertension)    Macular degeneration    Right ([place])   Osteoarthritis    thumbs, hips, knees (s/p R TKR)   PUD (peptic ulcer disease) 03/2013   2 antral gastric ulcers and 1 duodenal ulcer, NSAID related, admitted to [facility] with melena/anemia s/p EGD   Sleep apnea    CPAP   Tremor    intention    Surgical History: Past Surgical History:  Procedure Laterality Date   BELPHAROPTOSIS REPAIR  2012   carotid US  12/2011   no significant stenosis   CATARACT EXTRACTION Bilateral 2011, 2012  CHOLECYSTECTOMY  1997   COLONOSCOPY  2009   per patient, told no longer needed   CYSTOSCOPY W/ URETERAL STENT PLACEMENT Left 11/01/2016   Procedure: CYSTOSCOPY WITH STENT REPLACEMENT;  Surgeon: Hollice Espy, MD;  Location: [facility] OR[facility];  Service: Urology;  Laterality: Left;   CYSTOSCOPY WITH STENT PLACEMENT Left 10/26/2016   Procedure: CYSTOSCOPY WITH STENT PLACEMENT;  Surgeon: Hollice Espy, MD;  Location: [facility];  Service: Urology;  Laterality: Left;   ESOPHAGOGASTRODUODENOSCOPY  04/2013   hospitalization @ [place], gastritis, gastric ulcers and duodenal ulcer with clean base ([place])   HOLEP-LASER ENUCLEATION OF THE PROSTATE WITH MORCELLATION N/A 11/01/2016   Procedure: HOLEP-LASER ENUCLEATION OF THE PROSTATE WITH MORCELLATION;  Surgeon: Hollice Espy, MD;  Location: [facility];  Service: Urology;  Laterality: N/A;   KIDNEY STONE SURGERY  85, 89, 89   TOTAL KNEE ARTHROPLASTY Right 2006   URETEROSCOPY Left 10/26/2016   Procedure: URETEROSCOPY;  Surgeon:  Hollice Espy, MD;  Location: [facility] OR[facility];  Service: Urology;  Laterality: Left;   URETEROSCOPY WITH HOLMIUM LASER LITHOTRIPSY Left 11/01/2016   Procedure: URETEROSCOPY WITH HOLMIUM LASER LITHOTRIPSY;  Surgeon: Hollice Espy, MD;  Location: [facility];  Service: Urology;  Laterality: Left;   US ECHOCARDIOGRAPHY  12/2011   nl LV fxn, EF 60%, nl valves, dilated aortic root (42mm)    Home Medications:  Allergies as of 01/15/2022       Reactions   Sulfa Antibiotics    As infant   Penicillins Rash        Medication List        Accurate as of January 15, 2022 10:13 AM. If you have any questions, ask your nurse or doctor.          acetaminophen 500 MG tablet Commonly known as: TYLENOL Take 1,000 mg by mouth 2 (two) times daily.   atorvastatin 20 MG tablet Commonly known as: LIPITOR Take 20 mg by mouth every evening.   brimonidine-timolol 0.2-0.5 % ophthalmic solution Commonly known as: COMBIGAN Place 1 drop into both eyes 2 (two) times daily.   carbidopa-levodopa 25-100 MG tablet Commonly known as: SINEMET IR   finasteride 5 MG tablet Commonly known as: PROSCAR Take 5 mg by mouth every morning.   glimepiride 4 MG tablet Commonly known as: AMARYL Take 8 mg by mouth daily before breakfast.   HAIR/SKIN/NAILS PO Take 1 tablet by mouth 2 (two) times daily.   Melatonin 5 MG Caps Take by mouth.   MELATONIN PO Take by mouth daily.   memantine 10 MG tablet Commonly known as: NAMENDA Take 10 mg by mouth 2 (two) times daily.   metFORMIN 500 MG 24 hr tablet Commonly known as: GLUCOPHAGE-XR Take 500 mg by mouth 2 (two) times daily.   PRESERVISION AREDS 2 PO Take 1 tablet by mouth 2 (two) times daily.   rOPINIRole 0.5 MG tablet Commonly known as: REQUIP Take 1 mg by mouth 2 (two) times daily.   sertraline 25 MG tablet Commonly known as: ZOLOFT 1 tablet in the morning for 4 weeks, Then 2 tabs in morning thereafter   valsartan-hydrochlorothiazide 160-25 MG  tablet Commonly known as: DIOVAN-HCT Take by mouth every morning. Takes 1/2 tablet by mouth daily        Allergies:  Allergies  Allergen Reactions   Sulfa Antibiotics     As infant   Penicillins Rash    Family History: Family History  Problem Relation Age of Onset   Cancer Sister  lung, nonsmoker   Cancer Sister        lung, nonsmoker   Dementia Mother    CAD Father        "heart problems"   CAD Brother 71       MI   Stroke Neg Hx    Prostate cancer Neg Hx    Bladder Cancer Neg Hx    Kidney cancer Neg Hx     Social History:  reports that he quit smoking about 23 years ago. His smoking use included cigarettes and cigars. He started smoking about 23 years ago. He has a 45.00 pack-year smoking history. He has never used smokeless tobacco. He reports current alcohol use. He reports that he does not use drugs.  ROS: For pertinent review of systems please refer to history of present illness  Physical Exam: BP 136/80   Pulse 68   Ht $R'5\' 6"'FZ$  (1.676 m)   Wt 173 lb (78.5 kg)   BMI 27.92 kg/m   Constitutional:  Well nourished. Alert and oriented, No acute distress. HEENT: [place] AT, moist mucus membranes.  Trachea midline Cardiovascular: No clubbing, cyanosis, or edema. Respiratory: Normal respiratory effort, no increased work of breathing. Neurologic: Grossly intact, no focal deficits, moving all 4 extremities. Psychiatric: Normal mood and affect.   Laboratory Data: WBC (White Blood Cell Count) 4.1 - 10.2 10^3/uL 7.0   RBC (Red Blood Cell Count) 4.69 - 6.13 10^6/uL 4.21 Low    Hemoglobin 14.1 - 18.1 gm/dL 13.4 Low    Hematocrit 40.0 - 52.0 % 41.9   MCV (Mean Corpuscular Volume) 80.0 - 100.0 fl 99.5   MCH (Mean Corpuscular Hemoglobin) 27.0 - 31.2 pg 31.8 High    MCHC (Mean Corpuscular Hemoglobin Concentration) 32.0 - 36.0 gm/dL 32.0   Platelet Count 150 - 450 10^3/uL 208   RDW-CV (Red Cell Distribution Width) 11.6 - 14.8 % 13.2   MPV (Mean Platelet Volume) 9.4 -  12.4 fl 10.7   Neutrophils 1.50 - 7.80 10^3/uL 4.68   Lymphocytes 1.00 - 3.60 10^3/uL 1.62   Monocytes 0.00 - 1.50 10^3/uL 0.51   Eosinophils 0.00 - 0.55 10^3/uL 0.14   Basophils 0.00 - 0.09 10^3/uL 0.02   Neutrophil % 32.0 - 70.0 % 67.1   Lymphocyte % 10.0 - 50.0 % 23.2   Monocyte % 4.0 - 13.0 % 7.3   Eosinophil % 1.0 - 5.0 % 2.0   Basophil% 0.0 - 2.0 % 0.3   Immature Granulocyte % <=0.7 % 0.1   Immature Granulocyte Count <=0.06 10^3/L 0.01   Resulting Agency  [facility] - LAB   Specimen Collected: 09/10/21 08:04 Last Resulted: 09/10/21 12:06  Received From: [facility]  Result Received: 10/08/21 08:51   Thyroid Stimulating Hormone (TSH) 0.450-5.330 uIU/ml uIU/mL 2.055   Resulting Agency  [facility] - [facility]   Specimen Collected: 09/10/21 08:04 Last Resulted: 09/10/21 12:26  Received From: [facility]  Result Received: 10/08/21 08:51   Hemoglobin A1C 4.2 - 5.6 % 7.5 High    Average Blood Glucose (Calc) mg/dL 169   Resulting Agency  [facility] - LAB  Narrative Performed by [facility] - LAB Normal Range:    4.2 - 5.6%  Increased Risk:  5.7 - 6.4%  Diabetes:        >= 6.5%  Glycemic Control for adults with diabetes:  <7%    Specimen Collected: 09/10/21 08:04 Last Resulted: 09/10/21 14:48  Received From: [facility]  Result Received: 10/08/21 08:51   Cholesterol, Total 100 - 200 mg/dL 134   Triglyceride 35 - 199 mg/dL 142   HDL (High Density Lipoprotein) Cholesterol 29.0 - 71.0 mg/dL 45.9   LDL Calculated 0 - 130 mg/dL 60   VLDL Cholesterol mg/dL 28   Cholesterol/HDL Ratio  2.9   Resulting Agency  [facility] - [facility]   Specimen Collected: 09/10/21 08:04 Last Resulted: 09/10/21 13:45  Received From: [facility]  Result Received: 10/08/21 08:51   Glucose 70 - 110 mg/dL 139 High    Sodium 136 - 145 mmol/L 139   Potassium 3.6 - 5.1 mmol/L 4.1   Chloride 97 -  109 mmol/L 102   Carbon Dioxide (CO2) 22.0 - 32.0 mmol/L 31.0   Urea Nitrogen (BUN) 7 - 25 mg/dL 20   Creatinine 0.7 - 1.3 mg/dL 0.8   Glomerular Filtration Rate (eGFR), MDRD Estimate >60 mL/min/1.73sq m 92   Calcium 8.7 - 10.3 mg/dL 9.3   AST  8 - 39 U/L 22   ALT  6 - 57 U/L 9   Alk Phos (alkaline Phosphatase) 34 - 104 U/L 57   Albumin 3.5 - 4.8 g/dL 3.9   Bilirubin, Total 0.3 - 1.2 mg/dL 1.1   Protein, Total 6.1 - 7.9 g/dL 6.3   A/G Ratio 1.0 - 5.0 gm/dL 1.6   Resulting Agency  [facility] - LAB   Specimen Collected: 09/10/21 08:04 Last Resulted: 09/10/21 13:43  Received From: [facility]  Result Received: 10/08/21 08:51  I have reviewed the labs.  Pertinent Imaging:  01/15/22 09:49  Scan Result 80mL      Assessment & Plan:    1. Kidney stones -asymptomatic  -chest CT bilateral stones -continue to monitor  2. Benign prostatic hyperplasia with urinary obstruction -continue finasteride 5 mg daily   3. sclerotic lesion within the T6 vertebral body posteriorly -PSA pending  No follow-ups on file.  Zara Council, PA-C  [facility] [address] [place], [place] [address] [phone]

## 2022-01-16 LAB — PSA: Prostate Specific Ag, Serum: 1.7 ng/mL (ref 0.0–4.0)

## 2022-02-09 ENCOUNTER — Other Ambulatory Visit: Payer: Self-pay | Admitting: Student

## 2022-02-09 DIAGNOSIS — R140 Abdominal distension (gaseous): Secondary | ICD-10-CM

## 2022-02-09 DIAGNOSIS — G20 Parkinson's disease: Secondary | ICD-10-CM

## 2022-02-10 ENCOUNTER — Other Ambulatory Visit: Payer: Self-pay | Admitting: Family Medicine

## 2022-02-10 DIAGNOSIS — R911 Solitary pulmonary nodule: Secondary | ICD-10-CM

## 2022-02-19 ENCOUNTER — Ambulatory Visit
Admission: RE | Admit: 2022-02-19 | Discharge: 2022-02-19 | Disposition: A | Discharge status: Home / Self Care | Payer: Medicare Other | Source: Ambulatory Visit | Attending: Student | Admitting: Student

## 2022-02-19 ENCOUNTER — Ambulatory Visit
Admission: RE | Admit: 2022-02-19 | Discharge: 2022-02-19 | Disposition: A | Discharge status: Home / Self Care | Payer: Medicare Other | Source: Ambulatory Visit | Attending: Family Medicine | Admitting: Family Medicine

## 2022-02-19 DIAGNOSIS — R911 Solitary pulmonary nodule: Secondary | ICD-10-CM | POA: Insufficient documentation

## 2022-02-19 DIAGNOSIS — R140 Abdominal distension (gaseous): Secondary | ICD-10-CM | POA: Insufficient documentation

## 2022-02-19 MED ORDER — IOHEXOL 300 MG/ML  SOLN
85.0000 mL | Freq: Once | INTRAMUSCULAR | Status: AC | PRN
Start: 2022-02-19 — End: 2022-02-19
  Administered 2022-02-19: 85 mL via INTRAVENOUS

## 2022-05-03 ENCOUNTER — Ambulatory Visit: Payer: Medicare Other | Admitting: Dietician

## 2022-05-20 ENCOUNTER — Encounter: Payer: Medicare Other | Attending: Neurology | Admitting: Dietician

## 2022-05-20 ENCOUNTER — Encounter: Payer: Self-pay | Admitting: Dietician

## 2022-05-20 VITALS — Ht 66.0 in | Wt 171.4 lb

## 2022-05-20 DIAGNOSIS — R634 Abnormal weight loss: Secondary | ICD-10-CM | POA: Diagnosis not present

## 2022-05-20 DIAGNOSIS — Z713 Dietary counseling and surveillance: Secondary | ICD-10-CM | POA: Diagnosis not present

## 2022-05-20 DIAGNOSIS — I10 Essential (primary) hypertension: Secondary | ICD-10-CM | POA: Insufficient documentation

## 2022-05-20 DIAGNOSIS — E119 Type 2 diabetes mellitus without complications: Secondary | ICD-10-CM | POA: Diagnosis not present

## 2022-05-20 DIAGNOSIS — G20A1 Parkinson's disease without dyskinesia, without mention of fluctuations: Secondary | ICD-10-CM | POA: Diagnosis not present

## 2022-05-20 DIAGNOSIS — Z6827 Body mass index (BMI) 27.0-27.9, adult: Secondary | ICD-10-CM | POA: Diagnosis not present

## 2022-05-20 NOTE — Patient Instructions (Signed)
Control portions of starchy foods (rice, pasta, potato, cereal) to 1 cup (fist size) or slightly less. Make sure to include a fruit or vegetable, or both, with each meal. Eat generous portions of veggies -- handful-size to fist-size. Keep up regular exercise, great job!

## 2022-05-20 NOTE — Progress Notes (Signed)
Medical Nutrition Therapy: Visit start time: 1430  end time: 1530  Assessment:   Referral Diagnosis: abnormal weight loss Other medical history/ diagnoses: Parkinson's disease x3 yearas, macular degeneration, HLD, HTN, Type 2 Diabetes Psychosocial issues/ stress concerns: Parkinson's related dementia  Medications, supplements: reconciled list in medical record   Preferred learning method:  Hands-on   Current weight: 171.4lbs Height: 5'6" BMI: 27.66 Patient's personal weight goal: 160lbs  Progress and evaluation:  Lost 70lbs during COVID with diet (weight watchers) and exercise (walking, also Theracycle that works both legs and arms for Parkinsons)  More recently lost more weight despite regular eating; has begun supplementing with Fairlife protein drink Checks weight daily in am, currently stable at 163lbs when using scale at home Recently had some nodules Wife reports patient buys candy and chips at grocery store; patient denies; he states he does eat some chips and eats candy occasionally from wife's supply, mostly hershey minis.  Increased constipation, gas with parkinsons dz Recent labs (03/12/22) indicate: HbA1C 7.5%, total cholesterol 123, HDL 39.4, LDL 58, Triglycerides 127. Food allergies: none Special diet practices: diabetes diet Patient seeks help with improving overall nutrition, preventing excessive weight loss or weight gain    Dietary Intake:  Usual eating pattern includes 3 meals and 1-2 snacks per day. Dining out frequency: 2 meals per week. Who plans meals/ buys groceries? Self, spouse Who prepares meals? spouse  Breakfast: plain cheerios cereal with blueberries and raspberries, or banana; occasionally egg sandwich with bacon, lettuce, tomato Snack: none Lunch: 1/2 sandwich with [place], ham, or occ pastrami, with 1/2 slice cheese, occ with chips, occ grapes Snack: fairlife protein shake Supper: cooked meal-- meat + veg/ salad, sometimes with starch;  often chicken, veg, salad Snack: sometimes apple  Beverages: 1-2c coffee in am, crystal light, water -- aims for 64oz daily  Physical activity: walking, Theracycle 30 minutes 4-6 times a week (pedometer on phone) PT resistance water walking, weights 2x a week   Intervention:   Nutrition Care Education:   Basic nutrition: basic food groups; appropriate nutrient balance; appropriate meal and snack schedule; general nutrition guidelines    Weight control: identifying healthy weight; importance of low sugar and low fat choices; appropriate food portions; estimated energy needs for weight maintenance/ minimal loss at 1600-1700 kcal, provided guidance for 45% CHO, 25% pro, 30% fat Diabetes:   appropriate meal and snack schedule; appropriate carb intake and balance, making healthy carb choices most of the time while allowing for small amounts/ occasional treats; role of protein, physical activity  Other intervention notes: Patient has been making some changes to improve BG control; he is motivated to continue.  Weight loss has currently stabilized at about 163lbs on home scale per patient, he is content with maintaining this weight, but would like to lose some additional abdominal fat. Established nutrition goals with direction from patient.  No follow up scheduled at this time; patient to schedule later as needed.   Nutritional Diagnosis:  [place]-2.2 Altered nutrition-related laboratory As related to Diabetes.  As evidenced by elevated HbA1C. [place]-3.2 Unintentional weight loss As related to recent weight loss without diet changes.  As evidenced by loss of about 20lbs despite no change in eating habits.   Education Materials given:  Designer, industrial/product with food lists, sample meal pattern Visit summary with goals/ instructions   Learner/ who was taught:  Patient  Spouse/ partner left visit per patient preference (he voiced concern she would dominate the conversation)  Level of  understanding: Verbalizes/  demonstrates competency  Demonstrated degree of understanding via:   Teach back Learning barriers: None  Willingness to learn/ readiness for change: Eager, change in progress  Monitoring and Evaluation:  Dietary intake, exercise, and body weight      follow up: prn

## 2022-07-31 ENCOUNTER — Emergency Department
Admission: EM | Admit: 2022-07-31 | Discharge: 2022-08-01 | Disposition: A | Discharge status: Home / Self Care | Payer: Medicare Other | Attending: Emergency Medicine | Admitting: Emergency Medicine

## 2022-07-31 ENCOUNTER — Emergency Department: Payer: Medicare Other

## 2022-07-31 ENCOUNTER — Other Ambulatory Visit: Payer: Self-pay

## 2022-07-31 DIAGNOSIS — W01198A Fall on same level from slipping, tripping and stumbling with subsequent striking against other object, initial encounter: Secondary | ICD-10-CM | POA: Insufficient documentation

## 2022-07-31 DIAGNOSIS — S0990XA Unspecified injury of head, initial encounter: Secondary | ICD-10-CM | POA: Diagnosis present

## 2022-07-31 DIAGNOSIS — S0003XA Contusion of scalp, initial encounter: Secondary | ICD-10-CM | POA: Diagnosis not present

## 2022-07-31 DIAGNOSIS — W19XXXA Unspecified fall, initial encounter: Secondary | ICD-10-CM | POA: Diagnosis not present

## 2022-07-31 DIAGNOSIS — Y92000 Kitchen of unspecified non-institutional (private) residence as  the place of occurrence of the external cause: Secondary | ICD-10-CM | POA: Insufficient documentation

## 2022-07-31 DIAGNOSIS — S06330A Contusion and laceration of cerebrum, unspecified, without loss of consciousness, initial encounter: Secondary | ICD-10-CM

## 2022-07-31 DIAGNOSIS — E119 Type 2 diabetes mellitus without complications: Secondary | ICD-10-CM | POA: Insufficient documentation

## 2022-07-31 DIAGNOSIS — G20C Parkinsonism, unspecified: Secondary | ICD-10-CM | POA: Insufficient documentation

## 2022-07-31 DIAGNOSIS — Z87891 Personal history of nicotine dependence: Secondary | ICD-10-CM | POA: Insufficient documentation

## 2022-07-31 DIAGNOSIS — I10 Essential (primary) hypertension: Secondary | ICD-10-CM | POA: Diagnosis not present

## 2022-07-31 LAB — CBC WITH DIFFERENTIAL/PLATELET
Abs Immature Granulocytes: 0.02 10*3/uL (ref 0.00–0.07)
Basophils Absolute: 0 10*3/uL (ref 0.0–0.1)
Basophils Relative: 0 %
Eosinophils Absolute: 0.1 10*3/uL (ref 0.0–0.5)
Eosinophils Relative: 2 %
HCT: 40.9 % (ref 39.0–52.0)
Hemoglobin: 13 g/dL (ref 13.0–17.0)
Immature Granulocytes: 0 %
Lymphocytes Relative: 22 %
Lymphs Abs: 1.5 10*3/uL (ref 0.7–4.0)
MCH: 30.9 pg (ref 26.0–34.0)
MCHC: 31.8 g/dL (ref 30.0–36.0)
MCV: 97.1 fL (ref 80.0–100.0)
Monocytes Absolute: 0.5 10*3/uL (ref 0.1–1.0)
Monocytes Relative: 8 %
Neutro Abs: 4.6 10*3/uL (ref 1.7–7.7)
Neutrophils Relative %: 68 %
Platelets: 196 10*3/uL (ref 150–400)
RBC: 4.21 MIL/uL — ABNORMAL LOW (ref 4.22–5.81)
RDW: 13.1 % (ref 11.5–15.5)
WBC: 6.8 10*3/uL (ref 4.0–10.5)
nRBC: 0 % (ref 0.0–0.2)

## 2022-07-31 LAB — COMPREHENSIVE METABOLIC PANEL
ALT: 8 U/L (ref 0–44)
AST: 31 U/L (ref 15–41)
Albumin: 3.6 g/dL (ref 3.5–5.0)
Alkaline Phosphatase: 55 U/L (ref 38–126)
Anion gap: 6 (ref 5–15)
BUN: 27 mg/dL — ABNORMAL HIGH (ref 8–23)
CO2: 26 mmol/L (ref 22–32)
Calcium: 8.5 mg/dL — ABNORMAL LOW (ref 8.9–10.3)
Chloride: 99 mmol/L (ref 98–111)
Creatinine, Ser: 0.82 mg/dL (ref 0.61–1.24)
GFR, Estimated: >60 mL/min (ref >60)
Glucose, Bld: 140 mg/dL — ABNORMAL HIGH (ref 70–99)
Potassium: 4.1 mmol/L (ref 3.5–5.1)
Sodium: 131 mmol/L — ABNORMAL LOW (ref 135–145)
Total Bilirubin: 1 mg/dL (ref 0.3–1.2)
Total Protein: 6.8 g/dL (ref 6.5–8.1)

## 2022-07-31 LAB — URINALYSIS, COMPLETE (UACMP) WITH MICROSCOPIC
Bacteria, UA: NONE SEEN
Bilirubin Urine: NEGATIVE
Glucose, UA: NEGATIVE mg/dL
Hgb urine dipstick: NEGATIVE
Ketones, ur: 5 mg/dL — AB
Leukocytes,Ua: NEGATIVE
Nitrite: NEGATIVE
Protein, ur: NEGATIVE mg/dL
Specific Gravity, Urine: 1.006 (ref 1.005–1.030)
Squamous Epithelial / HPF: NONE SEEN /HPF (ref 0–5)
pH: 5 (ref 5.0–8.0)

## 2022-07-31 LAB — TROPONIN I (HIGH SENSITIVITY)
Troponin I (High Sensitivity): 35 ng/L — ABNORMAL HIGH (ref <18)
Troponin I (High Sensitivity): 35 ng/L — ABNORMAL HIGH (ref <18)

## 2022-07-31 MED ORDER — SODIUM CHLORIDE 0.9 % IV BOLUS
1000.0000 mL | Freq: Once | INTRAVENOUS | Status: AC
  Administered 2022-07-31: 1000 mL via INTRAVENOUS

## 2022-07-31 NOTE — ED Triage Notes (Signed)
First Nurse Note:  C/O falling in kitchen, hitting back of head on tile floor.  Unsure if LOC.  C?O nausea after fall.    Arrives AAOx3.  Ambulatory with cane.  States has history of parkinson's.

## 2022-07-31 NOTE — ED Triage Notes (Signed)
Patient has Parkinson's and was slightly dizzy and fell in the kitchen hitting his head on the tile; Hematoma noted to the back of patient's head; Intermittent dizziness for a few weeks; Patient states this has happened before and he was "just dehydrated"

## 2022-07-31 NOTE — ED Provider Notes (Signed)
[facility] Provider Note    Event Date/Time   First MD Initiated Contact with Patient 07/31/22 1606     (approximate)  History   Chief Complaint: Fall (Patient has Parkinson's and was slightly dizzy and fell in the kitchen hitting his head on the tile; Hematoma noted to the back of patient's head) and Dizziness (Intermittent dizziness for a few weeks; Patient states this has happened before and he was "just dehydrated")  HPI  Bradley [name] is a 87 y.o. male with a past medical history of Parkinson's, hypertension, hyperlipidemia, anemia, presents emergency department after a fall.  According to the patient and wife patient had mechanical fall falling backwards hitting the back of his head.  Wife states for the past several weeks or so patient has been intermittently dizzy at times, states previously this has happened with dehydration, but also sometimes is just due to his medications.  Here the patient has some pain to the back of the head where he has a moderate hematoma but has no other complaints has been ambulatory without issue since the fall.  No recent fever cough congestion no chest pain or shortness of breath.  Physical Exam   Triage Vital Signs: ED Triage Vitals  Enc Vitals Group     BP 07/31/22 1550 (!) 142/81     Pulse Rate 07/31/22 1550 64     Resp 07/31/22 1550 18     Temp 07/31/22 1550 97.7 F (36.5 C)     Temp Source 07/31/22 1550 Oral     SpO2 07/31/22 1550 97 %     Weight --      Height --      Head Circumference --      Peak Flow --      Pain Score 07/31/22 1554 8     Pain Loc --      Pain Edu? --      Excl. in [place]? --     Most recent vital signs: Vitals:   07/31/22 1550  BP: (!) 142/81  Pulse: 64  Resp: 18  Temp: 97.7 F (36.5 C)  SpO2: 97%    General: Awake, no distress.  Moderate hematoma to occipital scalp.  No laceration. CV:  Good peripheral perfusion.  Regular rate and rhythm  Resp:  Normal effort.  Equal  breath sounds bilaterally.  Abd:  No distention.  Soft, nontender.  No rebound or guarding.   ED Results / Procedures / Treatments   EKG  EKG viewed and interpreted by myself shows a normal sinus rhythm at 64 bpm with a slightly widened QRS, normal axis, normal intervals, nonspecific ST changes.  RADIOLOGY  I have reviewed and interpreted CT head images.  Patient appears to have a large occipital hematoma and also a small intracranial hemorrhage. Radiology is called me regarding a small subarachnoid hemorrhage.   MEDICATIONS ORDERED IN ED: Medications  sodium chloride 0.9 % bolus 1,000 mL (has no administration in time range)     IMPRESSION / MDM / ASSESSMENT AND PLAN / ED COURSE  I reviewed the triage vital signs and the nursing notes.  Patient's presentation is most consistent with acute presentation with potential threat to life or bodily function.  Patient presents emergency department after mechanical fall at home.  Wife states patient has been intermittently dizzy over the past few weeks but is on a lot of medications as well.  Overall patient appears well does have a moderate hematoma to occipital scalp, only anticoagulant  is an 81 mg aspirin daily.  We will check labs including urinalysis and a troponin.  We will obtain CT scans of the head and C-spine as precaution we will IV hydrate while awaiting results.  Patient and wife are agreeable to plan of care.  Patient is lab work shows no significant findings.  Slight troponin elevation we will repeat as a precaution.  Patient's chemistry shows no concerning findings, CBC is reassuring.  Patient has received IV fluids.  CT scan does show small subarachnoid bleed.  I spoke to Dr. Cari Caraway of neurosurgery.  We will repeat a CT scan at 10:15 PM if unchanged we will be able to discharge home with neurosurgery follow-up in the office.  If enlarging we will discuss further with neurosurgery.  Patient care signed out to oncoming  provider, repeat CT scan pending.  FINAL CLINICAL IMPRESSION(S) / ED DIAGNOSES   Fall Head injury    Note:  This document was prepared using Dragon voice recognition software and may include unintentional dictation errors.   Harvest Dark, MD 07/31/22 [phone]

## 2022-07-31 NOTE — ED Notes (Signed)
Pt to CT

## 2022-07-31 NOTE — ED Notes (Signed)
Pt ambulated to toilet and back to bed.  ?

## 2022-07-31 NOTE — ED Provider Notes (Signed)
Assumed patient care from attending Dr. Harvest Dark.  Repeat CT scan indicated interval increase in intraparenchymal hematoma.  I reached out to neurosurgeon on-call, Dr. Elayne Guerin to brief him regarding results.  He recommended a third CT head in 6 hours.  Patient report communicated to Dr. Loraine Leriche.   Bradley M[place], PA-C 07/31/22 2315    Bradley [place], MD 08/01/22 [phone]

## 2022-08-01 ENCOUNTER — Emergency Department: Payer: Medicare Other

## 2022-08-01 DIAGNOSIS — W19XXXA Unspecified fall, initial encounter: Secondary | ICD-10-CM

## 2022-08-01 DIAGNOSIS — S0990XA Unspecified injury of head, initial encounter: Secondary | ICD-10-CM | POA: Diagnosis not present

## 2022-08-01 DIAGNOSIS — S06330A Contusion and laceration of cerebrum, unspecified, without loss of consciousness, initial encounter: Secondary | ICD-10-CM

## 2022-08-01 MED ORDER — LACTATED RINGERS IV BOLUS
1000.0000 mL | Freq: Once | INTRAVENOUS | Status: AC
  Administered 2022-08-01: 1000 mL via INTRAVENOUS

## 2022-08-01 NOTE — ED Notes (Signed)
Provided water and applesauce before discharge.

## 2022-08-01 NOTE — ED Notes (Signed)
Pt and wife are awake. Pt has no noted neuro deficits. No arm drift or upper extremity ataxia noted. No visual deficits or HA. Grip strength equal. Pt asking to have bedrail down so can walk self to bathroom as needed. Informed pt this is not safe and he will need to use call bell to call this RN to come to room and help, or can use urinal. Pt given urinal and call bell is in place.

## 2022-08-01 NOTE — ED Provider Notes (Signed)
Procedures     ----------------------------------------- 8:48 AM on 08/01/2022 -----------------------------------------  Patient is cared with Dr. Cari Caraway after his evaluation.  He feels the stable and within testing variability, and finds the patient's exam reassuring.  Stable for outpatient follow-up.    Carrie Mew, MD 08/01/22 [phone]

## 2022-08-01 NOTE — ED Notes (Signed)
Pt transported to CT ?

## 2022-08-01 NOTE — Consult Note (Signed)
Consult requested by:  Dr. Jari Pigg  Consult requested for:  Intracranial contusion  Primary Physician:  Derinda Late, MD  History of Present Illness: 08/01/2022 Bradley [name] is here today with a chief complaint of intermittent dizziness over the past several weeks.  He had a fall yesterday and struck his head.  He denies loss of consciousness.  He is now feeling at his baseline status.  He does have some cognitive decline with a diagnosis of dementia and Parkinson's disease.  When I saw him this morning he has no complaints.  He lives at home with his wife.  He occasionally uses a cane.   I have utilized the care everywhere function in epic to review the outside records available from external health systems.  Review of Systems:  A 10 point review of systems is negative, except for the pertinent positives and negatives detailed in the HPI.  Past Medical History: Past Medical History:  Diagnosis Date   Anemia    BPH (benign prostatic hypertrophy)    Diabetes type 2, controlled (HCC)    GERD (gastroesophageal reflux disease)    RARE   Glaucoma    Syndor   Hearing loss    bilateral hearing aids occasionally   History of kidney stones    HLD (hyperlipidemia)    HTN (hypertension)    Macular degeneration    Right ([place])   Osteoarthritis    thumbs, hips, knees (s/p R TKR)   PUD (peptic ulcer disease) 03/2013   2 antral gastric ulcers and 1 duodenal ulcer, NSAID related, admitted to [facility] with melena/anemia s/p EGD   Sleep apnea    CPAP   Tremor    intention    Past Surgical History: Past Surgical History:  Procedure Laterality Date   BELPHAROPTOSIS REPAIR  2012   carotid US  12/2011   no significant stenosis   CATARACT EXTRACTION Bilateral 2011, 2012   [place] [facility]  2009   per patient, told no longer needed   CYSTOSCOPY W/ URETERAL STENT PLACEMENT Left 11/01/2016   Procedure: CYSTOSCOPY WITH STENT REPLACEMENT;  Surgeon:  Hollice Espy, MD;  Location: [facility] ORS;  Service: Urology;  Laterality: Left;   CYSTOSCOPY WITH STENT PLACEMENT Left 10/26/2016   Procedure: CYSTOSCOPY WITH STENT PLACEMENT;  Surgeon: Hollice Espy, MD;  Location: [facility] OR[facility];  Service: Urology;  Laterality: Left;   ESOPHAGOGASTRODUODENOSCOPY  04/2013   hospitalization @ [place], gastritis, gastric ulcers and duodenal ulcer with clean base ([facility])   HOLEP-LASER ENUCLEATION OF THE PROSTATE WITH MORCELLATION N/A 11/01/2016   Procedure: HOLEP-LASER ENUCLEATION OF THE PROSTATE WITH MORCELLATION;  Surgeon: Hollice Espy, MD;  Location: [facility] OR[facility];  Service: Urology;  Laterality: N/A;   KIDNEY STONE SURGERY  83, 87, 90   TOTAL KNEE ARTHROPLASTY Right 2006   URETEROSCOPY Left 10/26/2016   Procedure: URETEROSCOPY;  Surgeon: Hollice Espy, MD;  Location: [facility] OR[facility];  Service: Urology;  Laterality: Left;   URETEROSCOPY WITH HOLMIUM LASER LITHOTRIPSY Left 11/01/2016   Procedure: URETEROSCOPY WITH HOLMIUM LASER LITHOTRIPSY;  Surgeon: Hollice Espy, MD;  Location: [facility] OR[facility];  Service: Urology;  Laterality: Left;   US ECHOCARDIOGRAPHY  12/2011   nl LV fxn, EF 60%, nl valves, dilated aortic root (21m)    Allergies: Allergies as of 07/31/2022 - Review Complete 07/31/2022  Allergen Reaction Noted   Sulfa antibiotics  10/21/2012   Penicillins Rash 10/21/2012    Medications: No outpatient medications have been marked as taking for the 07/31/22 encounter ([facility]  Encounter).    Social History: Social History   Tobacco Use   Smoking status: Former    Packs/day: 1.00    Years: 45.00    Total pack years: 45.00    Types: Cigarettes, Cigars    Start date: 05/24/1998    Quit date: 10/23/1998    Years since quitting: 23.7   Smokeless tobacco: Never  Substance Use Topics   Alcohol use: Not Currently    Comment: Occasional, maybe 5-6 per year   Drug use: No    Family Medical History: Family History  Problem Relation Age of Onset   Cancer Sister        lung,  nonsmoker   Cancer Sister        lung, nonsmoker   Dementia Mother    CAD Father        "heart problems"   CAD Brother 3       MI   Stroke Neg Hx    Prostate cancer Neg Hx    Bladder Cancer Neg Hx    Kidney cancer Neg Hx     Physical Examination: Vitals:   08/01/22 0830 08/01/22 0857  BP: 118/62   Pulse: 70   Resp: 15   Temp:    SpO2: 93% 94%    General: Patient is well developed, well nourished, calm, collected, and in no apparent distress. Attention to examination is appropriate.  Neck:   Supple.  Full range of motion.  Respiratory: Patient is breathing without any difficulty.   NEUROLOGICAL:     Awake, alert, oriented to person, place, and time.  Speech is clear and fluent.  Cranial Nerves: Pupils equal round and reactive to light.  Facial tone is symmetric.  Facial sensation is symmetric. Shoulder shrug is symmetric. Tongue protrusion is midline.  There is no pronator drift.  Visual field examination shows no obvious deficits.  He has known diminished vision in his right eye due to macular degeneration.  He moves all extremities well with full strength.  Hoffman's is absent.   Bilateral upper and lower extremity sensation is intact to light touch.    No evidence of dysmetria noted.  Gait is untested.     Medical Decision Making  Imaging: CT Head 08/01/2022 IMPRESSION: 1. Right occipital pole probable hemorrhagic contusion is slowly enlarging, now about 18 mm diameter. However, the overall blood volume has increased from about 1 mL at presentation to only 2-3 mL now. Stable surrounding edema and no significant mass effect.   2. Stable trace adjacent subarachnoid blood. No new intracranial hemorrhage or abnormality.   3. Broad-based posterior convexity scalp hematoma. No skull fracture identified.   4. Chronic sphenoid sinus disease.     Electronically Signed   By: Genevie Ann M.D.   On: 08/01/2022 06:33  I have personally reviewed the images and  agree with the above interpretation.  Assessment and Plan: [name] is a pleasant 87 y.o. male with right-sided occipital contusion without obvious neurologic deficit at this time.  He is at his baseline.  He has a GCS score 15.  He is on a baby aspirin.  When I have reviewed his CT scans, I feel that his current and prior CT scan are relatively stable.  As such, I have cleared him for discharge.  I will see him back in clinic in 2 to 4 weeks with a repeat CT scan.  We discussed return to emergency department precautions.    I have communicated my recommendations to the  requesting physician and coordinated care to facilitate these recommendations.     Leonilda Cozby K. Izora Ribas MD, [facility] Neurosurgery

## 2022-08-01 NOTE — ED Notes (Signed)
Pt wanted to get up and walk around room. This RN stayed in room with pt while he walked using cane. Pt now in bed, lying on side. IVF infusing.

## 2022-08-01 NOTE — Discharge Instructions (Signed)
Continue taking all of your home medications and follow up with Dr. Rhea Bleacher office.

## 2022-08-01 NOTE — ED Provider Notes (Addendum)
1:01 AM Assumed care for off going team.   Blood pressure 138/74, pulse 60, temperature 98 F (36.7 C), resp. rate 16, SpO2 98 %.  See their HPI for full report but in brief pending repeat CT scan of head around 5AM.    Re-evaluated patient he denies any dizziness he reports feeling well at this time.  She states that if his repeat CT scan is stable he would like to go home.  Has been ambulatory to the bathroom.  He already gets physical therapy and denies feeling like he needs to be put into a rehab facility at this time.    Repeat CT imaging does show slightly expanding bleed.  Remains stable clinically- Discussed with Dr. Izora Ribas is going to come in and see patient and decide on final disposition         Vanessa Manning, MD 08/01/22 0131    Vanessa Milroy, MD 08/01/22 [phone]

## 2022-08-01 NOTE — ED Notes (Signed)
Pt was stating he is thirsty. Informed EDP who ordered fluid bolus.

## 2022-08-01 NOTE — ED Notes (Signed)
Dr Cari Caraway neurosurgeon at bedside assessing pt. Pt has no complaints, no visual deficits, is alert and oriented, and denies HA.

## 2022-08-01 NOTE — ED Notes (Signed)
IV infiltrated. Will start new.

## 2022-08-03 ENCOUNTER — Telehealth: Payer: Self-pay | Admitting: Neurosurgery

## 2022-08-03 DIAGNOSIS — S06330D Contusion and laceration of cerebrum, unspecified, without loss of consciousness, subsequent encounter: Secondary | ICD-10-CM

## 2022-08-03 NOTE — Telephone Encounter (Signed)
Patient seen in the ER on 3/9 due to Contusion of cerebrum without loss of consciousness, wife is stating he needs a new CT with in 2 weeks and then a followup appt. Can someone please confirm.

## 2022-08-03 NOTE — Telephone Encounter (Signed)
Yes per CKY:  Meade Maw, MD  Berdine Addison, RN Follow up CT in 2-4 weeks with likely telephone follow up   I will order the CT scan.

## 2022-08-04 NOTE — Telephone Encounter (Signed)
Patient's wife has been notified of Dr.Yarbrough's plan. Has the order been placed? She was going to call imaging to get that scheduled.

## 2022-08-04 NOTE — Telephone Encounter (Signed)
Order has been placed. They should schedule CT around 08/25/22-09/01/22.

## 2022-08-05 NOTE — Telephone Encounter (Signed)
Noted. Referral message has been sent to scheduling.

## 2022-08-09 NOTE — Telephone Encounter (Signed)
Patient's wife confirmed 09/02/2022

## 2022-08-13 ENCOUNTER — Ambulatory Visit: Payer: Medicare Other

## 2022-08-25 ENCOUNTER — Ambulatory Visit
Admission: RE | Admit: 2022-08-25 | Discharge: 2022-08-25 | Disposition: A | Discharge status: Home / Self Care | Payer: Medicare Other | Source: Ambulatory Visit | Attending: Neurosurgery | Admitting: Neurosurgery

## 2022-08-25 DIAGNOSIS — S06330D Contusion and laceration of cerebrum, unspecified, without loss of consciousness, subsequent encounter: Secondary | ICD-10-CM | POA: Insufficient documentation

## 2022-09-02 ENCOUNTER — Ambulatory Visit (INDEPENDENT_AMBULATORY_CARE_PROVIDER_SITE_OTHER): Payer: Medicare Other | Admitting: Neurosurgery

## 2022-09-02 DIAGNOSIS — I629 Nontraumatic intracranial hemorrhage, unspecified: Secondary | ICD-10-CM

## 2022-09-02 NOTE — Progress Notes (Signed)
I contacted Bradley [name] to review his CT scan.  He is well.  He has no symptoms.  He denies headache, nausea, vomiting.  His CT scan showed resolution of his hemorrhage.  He is no longer on any sort of restrictions.  He is cleared to go back to his normal activities.  I will see him back on an as-needed basis.

## 2022-11-09 ENCOUNTER — Inpatient Hospital Stay
Admission: EM | Admit: 2022-11-09 | Discharge: 2022-11-12 | DRG: 377 | Disposition: A | Discharge status: Nursing Home (Includes ICF) | Payer: Medicare Other | Source: Skilled Nursing Facility | Attending: Internal Medicine | Admitting: Internal Medicine

## 2022-11-09 ENCOUNTER — Other Ambulatory Visit: Payer: Self-pay

## 2022-11-09 DIAGNOSIS — D72829 Elevated white blood cell count, unspecified: Secondary | ICD-10-CM | POA: Diagnosis present

## 2022-11-09 DIAGNOSIS — Z8249 Family history of ischemic heart disease and other diseases of the circulatory system: Secondary | ICD-10-CM | POA: Diagnosis not present

## 2022-11-09 DIAGNOSIS — E119 Type 2 diabetes mellitus without complications: Secondary | ICD-10-CM

## 2022-11-09 DIAGNOSIS — I452 Bifascicular block: Secondary | ICD-10-CM | POA: Diagnosis present

## 2022-11-09 DIAGNOSIS — H9190 Unspecified hearing loss, unspecified ear: Secondary | ICD-10-CM | POA: Diagnosis present

## 2022-11-09 DIAGNOSIS — Z9049 Acquired absence of other specified parts of digestive tract: Secondary | ICD-10-CM

## 2022-11-09 DIAGNOSIS — N179 Acute kidney failure, unspecified: Secondary | ICD-10-CM | POA: Diagnosis present

## 2022-11-09 DIAGNOSIS — M170 Bilateral primary osteoarthritis of knee: Secondary | ICD-10-CM | POA: Diagnosis present

## 2022-11-09 DIAGNOSIS — E785 Hyperlipidemia, unspecified: Secondary | ICD-10-CM | POA: Diagnosis present

## 2022-11-09 DIAGNOSIS — M160 Bilateral primary osteoarthritis of hip: Secondary | ICD-10-CM | POA: Diagnosis present

## 2022-11-09 DIAGNOSIS — Z880 Allergy status to penicillin: Secondary | ICD-10-CM

## 2022-11-09 DIAGNOSIS — Z960 Presence of urogenital implants: Secondary | ICD-10-CM | POA: Diagnosis present

## 2022-11-09 DIAGNOSIS — Z87442 Personal history of urinary calculi: Secondary | ICD-10-CM

## 2022-11-09 DIAGNOSIS — K922 Gastrointestinal hemorrhage, unspecified: Secondary | ICD-10-CM | POA: Diagnosis not present

## 2022-11-09 DIAGNOSIS — M19041 Primary osteoarthritis, right hand: Secondary | ICD-10-CM | POA: Diagnosis present

## 2022-11-09 DIAGNOSIS — Z79899 Other long term (current) drug therapy: Secondary | ICD-10-CM | POA: Diagnosis not present

## 2022-11-09 DIAGNOSIS — H409 Unspecified glaucoma: Secondary | ICD-10-CM | POA: Diagnosis present

## 2022-11-09 DIAGNOSIS — D62 Acute posthemorrhagic anemia: Secondary | ICD-10-CM | POA: Diagnosis present

## 2022-11-09 DIAGNOSIS — E876 Hypokalemia: Secondary | ICD-10-CM | POA: Diagnosis present

## 2022-11-09 DIAGNOSIS — Z974 Presence of external hearing-aid: Secondary | ICD-10-CM

## 2022-11-09 DIAGNOSIS — I10 Essential (primary) hypertension: Secondary | ICD-10-CM | POA: Diagnosis present

## 2022-11-09 DIAGNOSIS — K267 Chronic duodenal ulcer without hemorrhage or perforation: Secondary | ICD-10-CM | POA: Diagnosis present

## 2022-11-09 DIAGNOSIS — E860 Dehydration: Secondary | ICD-10-CM | POA: Diagnosis present

## 2022-11-09 DIAGNOSIS — K254 Chronic or unspecified gastric ulcer with hemorrhage: Principal | ICD-10-CM | POA: Diagnosis present

## 2022-11-09 DIAGNOSIS — E1110 Type 2 diabetes mellitus with ketoacidosis without coma: Secondary | ICD-10-CM | POA: Diagnosis present

## 2022-11-09 DIAGNOSIS — D649 Anemia, unspecified: Secondary | ICD-10-CM

## 2022-11-09 DIAGNOSIS — Z7984 Long term (current) use of oral hypoglycemic drugs: Secondary | ICD-10-CM

## 2022-11-09 DIAGNOSIS — Z96651 Presence of right artificial knee joint: Secondary | ICD-10-CM | POA: Diagnosis present

## 2022-11-09 DIAGNOSIS — G20A1 Parkinson's disease without dyskinesia, without mention of fluctuations: Secondary | ICD-10-CM | POA: Diagnosis present

## 2022-11-09 DIAGNOSIS — G4733 Obstructive sleep apnea (adult) (pediatric): Secondary | ICD-10-CM | POA: Diagnosis present

## 2022-11-09 DIAGNOSIS — E1165 Type 2 diabetes mellitus with hyperglycemia: Secondary | ICD-10-CM

## 2022-11-09 DIAGNOSIS — N400 Enlarged prostate without lower urinary tract symptoms: Secondary | ICD-10-CM | POA: Diagnosis present

## 2022-11-09 DIAGNOSIS — Z87891 Personal history of nicotine dependence: Secondary | ICD-10-CM

## 2022-11-09 DIAGNOSIS — M19042 Primary osteoarthritis, left hand: Secondary | ICD-10-CM | POA: Diagnosis present

## 2022-11-09 DIAGNOSIS — G250 Essential tremor: Secondary | ICD-10-CM | POA: Insufficient documentation

## 2022-11-09 DIAGNOSIS — R451 Restlessness and agitation: Secondary | ICD-10-CM | POA: Diagnosis present

## 2022-11-09 DIAGNOSIS — Z882 Allergy status to sulfonamides status: Secondary | ICD-10-CM

## 2022-11-09 LAB — CBC
HCT: 16.1 % — ABNORMAL LOW (ref 39.0–52.0)
HCT: 21.3 % — ABNORMAL LOW (ref 39.0–52.0)
HCT: 18.5 % — ABNORMAL LOW (ref 39.0–52.0)
Hemoglobin: 5.2 g/dL — ABNORMAL LOW (ref 13.0–17.0)
Hemoglobin: 7.1 g/dL — ABNORMAL LOW (ref 13.0–17.0)
Hemoglobin: 6.4 g/dL — ABNORMAL LOW (ref 13.0–17.0)
MCH: 32.1 pg (ref 26.0–34.0)
MCH: 31.4 pg (ref 26.0–34.0)
MCH: 32.3 pg (ref 26.0–34.0)
MCHC: 32.3 g/dL (ref 30.0–36.0)
MCHC: 33.3 g/dL (ref 30.0–36.0)
MCHC: 34.6 g/dL (ref 30.0–36.0)
MCV: 99.4 fL (ref 80.0–100.0)
MCV: 94.2 fL (ref 80.0–100.0)
MCV: 93.4 fL (ref 80.0–100.0)
Platelets: 162 10*3/uL (ref 150–400)
Platelets: 143 10*3/uL — ABNORMAL LOW (ref 150–400)
Platelets: 131 10*3/uL — ABNORMAL LOW (ref 150–400)
RBC: 1.62 MIL/uL — ABNORMAL LOW (ref 4.22–5.81)
RBC: 2.26 MIL/uL — ABNORMAL LOW (ref 4.22–5.81)
RBC: 1.98 MIL/uL — ABNORMAL LOW (ref 4.22–5.81)
RDW: 14.6 % (ref 11.5–15.5)
RDW: 14.7 % (ref 11.5–15.5)
RDW: 15.1 % (ref 11.5–15.5)
WBC: 14.6 10*3/uL — ABNORMAL HIGH (ref 4.0–10.5)
WBC: 13.3 10*3/uL — ABNORMAL HIGH (ref 4.0–10.5)
WBC: 12.9 10*3/uL — ABNORMAL HIGH (ref 4.0–10.5)
nRBC: 0.1 % (ref 0.0–0.2)
nRBC: 0.3 % — ABNORMAL HIGH (ref 0.0–0.2)
nRBC: 0.3 % — ABNORMAL HIGH (ref 0.0–0.2)

## 2022-11-09 LAB — IRON AND TIBC
Iron: 108 ug/dL (ref 45–182)
Saturation Ratios: 39 % (ref 17.9–39.5)
TIBC: 274 ug/dL (ref 250–450)
UIBC: 166 ug/dL

## 2022-11-09 LAB — COMPREHENSIVE METABOLIC PANEL
ALT: 26 U/L (ref 0–44)
AST: 33 U/L (ref 15–41)
Albumin: 2.9 g/dL — ABNORMAL LOW (ref 3.5–5.0)
Alkaline Phosphatase: 30 U/L — ABNORMAL LOW (ref 38–126)
Anion gap: 16 — ABNORMAL HIGH (ref 5–15)
BUN: 94 mg/dL — ABNORMAL HIGH (ref 8–23)
CO2: 16 mmol/L — ABNORMAL LOW (ref 22–32)
Calcium: 8.2 mg/dL — ABNORMAL LOW (ref 8.9–10.3)
Chloride: 105 mmol/L (ref 98–111)
Creatinine, Ser: 1.54 mg/dL — ABNORMAL HIGH (ref 0.61–1.24)
GFR, Estimated: 44 mL/min — ABNORMAL LOW (ref >60)
Glucose, Bld: 422 mg/dL — ABNORMAL HIGH (ref 70–99)
Potassium: 4.6 mmol/L (ref 3.5–5.1)
Sodium: 137 mmol/L (ref 135–145)
Total Bilirubin: 0.5 mg/dL (ref 0.3–1.2)
Total Protein: 4.9 g/dL — ABNORMAL LOW (ref 6.5–8.1)

## 2022-11-09 LAB — URINALYSIS, W/ REFLEX TO CULTURE (INFECTION SUSPECTED)
Bilirubin Urine: NEGATIVE
Glucose, UA: >=500 mg/dL — AB
Hgb urine dipstick: NEGATIVE
Ketones, ur: NEGATIVE mg/dL
Leukocytes,Ua: NEGATIVE
Nitrite: NEGATIVE
Protein, ur: NEGATIVE mg/dL
Specific Gravity, Urine: 1.013 (ref 1.005–1.030)
pH: 5 (ref 5.0–8.0)

## 2022-11-09 LAB — BETA-HYDROXYBUTYRIC ACID: Beta-Hydroxybutyric Acid: 0.51 mmol/L — ABNORMAL HIGH (ref 0.05–0.27)

## 2022-11-09 LAB — TYPE AND SCREEN

## 2022-11-09 LAB — BASIC METABOLIC PANEL
Anion gap: 12 (ref 5–15)
BUN: 95 mg/dL — ABNORMAL HIGH (ref 8–23)
CO2: 18 mmol/L — ABNORMAL LOW (ref 22–32)
Calcium: 8.2 mg/dL — ABNORMAL LOW (ref 8.9–10.3)
Chloride: 110 mmol/L (ref 98–111)
Creatinine, Ser: 1.37 mg/dL — ABNORMAL HIGH (ref 0.61–1.24)
GFR, Estimated: 51 mL/min — ABNORMAL LOW (ref >60)
Glucose, Bld: 363 mg/dL — ABNORMAL HIGH (ref 70–99)
Potassium: 4.7 mmol/L (ref 3.5–5.1)
Sodium: 140 mmol/L (ref 135–145)

## 2022-11-09 LAB — LIPASE, BLOOD: Lipase: 40 U/L (ref 11–51)

## 2022-11-09 LAB — CBC WITH DIFFERENTIAL/PLATELET
Abs Immature Granulocytes: 0.16 10*3/uL — ABNORMAL HIGH (ref 0.00–0.07)
Basophils Absolute: 0 10*3/uL (ref 0.0–0.1)
Basophils Relative: 0 %
Eosinophils Absolute: 0 10*3/uL (ref 0.0–0.5)
Eosinophils Relative: 0 %
HCT: 15.7 % — ABNORMAL LOW (ref 39.0–52.0)
Hemoglobin: 5 g/dL — ABNORMAL LOW (ref 13.0–17.0)
Immature Granulocytes: 1 %
Lymphocytes Relative: 8 %
Lymphs Abs: 1.2 10*3/uL (ref 0.7–4.0)
MCH: 31.8 pg (ref 26.0–34.0)
MCHC: 31.8 g/dL (ref 30.0–36.0)
MCV: 100 fL (ref 80.0–100.0)
Monocytes Absolute: 0.5 10*3/uL (ref 0.1–1.0)
Monocytes Relative: 3 %
Neutro Abs: 12.9 10*3/uL — ABNORMAL HIGH (ref 1.7–7.7)
Neutrophils Relative %: 88 %
Platelets: 168 10*3/uL (ref 150–400)
RBC: 1.57 MIL/uL — ABNORMAL LOW (ref 4.22–5.81)
RDW: 14.6 % (ref 11.5–15.5)
WBC: 14.7 10*3/uL — ABNORMAL HIGH (ref 4.0–10.5)
nRBC: 0.1 % (ref 0.0–0.2)

## 2022-11-09 LAB — RETICULOCYTES
Immature Retic Fract: 33.9 % — ABNORMAL HIGH (ref 2.3–15.9)
RBC.: 1.53 MIL/uL — ABNORMAL LOW (ref 4.22–5.81)
Retic Count, Absolute: 65.6 10*3/uL (ref 19.0–186.0)
Retic Ct Pct: 4.3 % — ABNORMAL HIGH (ref 0.4–3.1)

## 2022-11-09 LAB — GLUCOSE, CAPILLARY
Glucose-Capillary: 216 mg/dL — ABNORMAL HIGH (ref 70–99)
Glucose-Capillary: 109 mg/dL — ABNORMAL HIGH (ref 70–99)

## 2022-11-09 LAB — PREPARE RBC (CROSSMATCH)

## 2022-11-09 LAB — APTT: aPTT: 22 seconds — ABNORMAL LOW (ref 24–36)

## 2022-11-09 LAB — PROTIME-INR
INR: 1.4 — ABNORMAL HIGH (ref 0.8–1.2)
Prothrombin Time: 17 seconds — ABNORMAL HIGH (ref 11.4–15.2)

## 2022-11-09 LAB — BPAM RBC

## 2022-11-09 LAB — FERRITIN: Ferritin: 26 ng/mL (ref 24–336)

## 2022-11-09 LAB — ABO/RH: ABO/RH(D): A POS

## 2022-11-09 LAB — CBG MONITORING, ED: Glucose-Capillary: 305 mg/dL — ABNORMAL HIGH (ref 70–99)

## 2022-11-09 LAB — VITAMIN B12: Vitamin B-12: 258 pg/mL (ref 180–914)

## 2022-11-09 LAB — FOLATE: Folate: 10.3 ng/mL (ref >5.9)

## 2022-11-09 MED ORDER — INSULIN GLARGINE-YFGN 100 UNIT/ML ~~LOC~~ SOLN
8.0000 [IU] | Freq: Every day | SUBCUTANEOUS | Status: DC
  Administered 2022-11-09: 8 [IU] via SUBCUTANEOUS
  Filled 2022-11-09 (×2): qty 0.08

## 2022-11-09 MED ORDER — CARBIDOPA-LEVODOPA 25-100 MG PO TABS
1.5000 | ORAL_TABLET | Freq: Four times a day (QID) | ORAL | Status: DC
  Administered 2022-11-09 – 2022-11-12 (×13): 1.5 via ORAL
  Filled 2022-11-09: qty 2
  Filled 2022-11-09 (×2): qty 1.5
  Filled 2022-11-09 (×6): qty 2
  Filled 2022-11-09 (×2): qty 1.5
  Filled 2022-11-09: qty 2
  Filled 2022-11-09: qty 1.5

## 2022-11-09 MED ORDER — SERTRALINE HCL 50 MG PO TABS
100.0000 mg | ORAL_TABLET | Freq: Every day | ORAL | Status: DC
  Administered 2022-11-09 – 2022-11-12 (×4): 100 mg via ORAL
  Filled 2022-11-09 (×4): qty 2

## 2022-11-09 MED ORDER — TIMOLOL MALEATE 0.5 % OP SOLN
1.0000 [drp] | Freq: Two times a day (BID) | OPHTHALMIC | Status: DC
  Administered 2022-11-09 – 2022-11-12 (×6): 1 [drp] via OPHTHALMIC
  Filled 2022-11-09: qty 5

## 2022-11-09 MED ORDER — ALBUTEROL SULFATE (2.5 MG/3ML) 0.083% IN NEBU: 3.0000 mL | INHALATION_SOLUTION | RESPIRATORY_TRACT | Status: DC | PRN

## 2022-11-09 MED ORDER — SODIUM CHLORIDE 0.9 % IV BOLUS: 500.0000 mL | Freq: Once | INTRAVENOUS | Status: AC

## 2022-11-09 MED ORDER — PANTOPRAZOLE SODIUM 40 MG IV SOLR: 40.0000 mg | Freq: Two times a day (BID) | INTRAVENOUS | Status: DC

## 2022-11-09 MED ORDER — INSULIN ASPART 100 UNIT/ML IJ SOLN: 0.0000 [IU] | Freq: Every day | INTRAMUSCULAR | Status: DC

## 2022-11-09 MED ORDER — HYDRALAZINE HCL 20 MG/ML IJ SOLN: 5.0000 mg | INTRAMUSCULAR | Status: DC | PRN

## 2022-11-09 MED ORDER — ONDANSETRON HCL 4 MG/2ML IJ SOLN
4.0000 mg | Freq: Once | INTRAMUSCULAR | Status: AC
  Administered 2022-11-09: 4 mg via INTRAVENOUS
  Filled 2022-11-09: qty 2

## 2022-11-09 MED ORDER — BUPROPION HCL ER (XL) 150 MG PO TB24
150.0000 mg | ORAL_TABLET | Freq: Every morning | ORAL | Status: DC
  Administered 2022-11-09 – 2022-11-12 (×4): 150 mg via ORAL
  Filled 2022-11-09 (×5): qty 1

## 2022-11-09 MED ORDER — IPRATROPIUM BROMIDE 0.06 % NA SOLN: 1.0000 | Freq: Two times a day (BID) | NASAL | Status: DC | PRN

## 2022-11-09 MED ORDER — FINASTERIDE 5 MG PO TABS
5.0000 mg | ORAL_TABLET | Freq: Every day | ORAL | Status: DC
  Administered 2022-11-09 – 2022-11-12 (×4): 5 mg via ORAL
  Filled 2022-11-09 (×4): qty 1

## 2022-11-09 MED ORDER — DOCUSATE SODIUM 100 MG PO CAPS: 100.0000 mg | ORAL_CAPSULE | Freq: Two times a day (BID) | ORAL | Status: DC | PRN

## 2022-11-09 MED ORDER — MELATONIN 5 MG PO TABS
10.0000 mg | ORAL_TABLET | Freq: Every day | ORAL | Status: DC
  Administered 2022-11-09 – 2022-11-10 (×2): 10 mg via ORAL
  Filled 2022-11-09 (×3): qty 2

## 2022-11-09 MED ORDER — ONDANSETRON HCL 4 MG/2ML IJ SOLN: 4.0000 mg | Freq: Three times a day (TID) | INTRAMUSCULAR | Status: DC | PRN

## 2022-11-09 MED ORDER — BRIMONIDINE TARTRATE-TIMOLOL 0.2-0.5 % OP SOLN
1.0000 [drp] | Freq: Two times a day (BID) | OPHTHALMIC | Status: DC
  Filled 2022-11-09 (×2): qty 5

## 2022-11-09 MED ORDER — PANTOPRAZOLE SODIUM 40 MG IV SOLR
40.0000 mg | Freq: Once | INTRAVENOUS | Status: AC
  Administered 2022-11-09: 40 mg via INTRAVENOUS
  Filled 2022-11-09: qty 10

## 2022-11-09 MED ORDER — INSULIN ASPART 100 UNIT/ML IJ SOLN
0.0000 [IU] | Freq: Three times a day (TID) | INTRAMUSCULAR | Status: DC
  Administered 2022-11-09: 7 [IU] via SUBCUTANEOUS
  Administered 2022-11-09: 3 [IU] via SUBCUTANEOUS
  Administered 2022-11-11 (×2): 2 [IU] via SUBCUTANEOUS
  Administered 2022-11-12: 1 [IU] via SUBCUTANEOUS
  Filled 2022-11-09 (×5): qty 1

## 2022-11-09 MED ORDER — ACETAMINOPHEN 325 MG PO TABS
650.0000 mg | ORAL_TABLET | Freq: Four times a day (QID) | ORAL | Status: DC | PRN
  Administered 2022-11-11: 650 mg via ORAL
  Filled 2022-11-09: qty 2

## 2022-11-09 MED ORDER — SODIUM CHLORIDE 0.9 % IV SOLN
10.0000 mL/h | Freq: Once | INTRAVENOUS | Status: AC
  Administered 2022-11-09: 10 mL/h via INTRAVENOUS

## 2022-11-09 MED ORDER — SODIUM CHLORIDE 0.9 % IV BOLUS
1000.0000 mL | Freq: Once | INTRAVENOUS | Status: AC
  Administered 2022-11-09: 1000 mL via INTRAVENOUS

## 2022-11-09 MED ORDER — ROPINIROLE HCL 1 MG PO TABS
0.5000 mg | ORAL_TABLET | Freq: Four times a day (QID) | ORAL | Status: DC
  Administered 2022-11-09 – 2022-11-12 (×13): 0.5 mg via ORAL
  Filled 2022-11-09 (×3): qty 1
  Filled 2022-11-09 (×3): qty 2
  Filled 2022-11-09 (×6): qty 1

## 2022-11-09 MED ORDER — PANTOPRAZOLE INFUSION (NEW) - SIMPLE MED
8.0000 mg/h | INTRAVENOUS | Status: DC
  Administered 2022-11-09 – 2022-11-10 (×3): 8 mg/h via INTRAVENOUS
  Filled 2022-11-09 (×4): qty 100

## 2022-11-09 MED ORDER — INSULIN ASPART 100 UNIT/ML IJ SOLN
10.0000 [IU] | Freq: Once | INTRAMUSCULAR | Status: AC
  Administered 2022-11-09: 10 [IU] via SUBCUTANEOUS
  Filled 2022-11-09: qty 1

## 2022-11-09 MED ORDER — BRIMONIDINE TARTRATE 0.2 % OP SOLN
1.0000 [drp] | Freq: Two times a day (BID) | OPHTHALMIC | Status: DC
  Administered 2022-11-09 – 2022-11-12 (×6): 1 [drp] via OPHTHALMIC
  Filled 2022-11-09: qty 5

## 2022-11-09 MED ORDER — VITAMIN D 25 MCG (1000 UNIT) PO TABS
1000.0000 [IU] | ORAL_TABLET | Freq: Two times a day (BID) | ORAL | Status: DC
  Administered 2022-11-09 – 2022-11-12 (×6): 1000 [IU] via ORAL
  Filled 2022-11-09 (×7): qty 1

## 2022-11-09 MED ORDER — SODIUM CHLORIDE 0.9 % IV SOLN: INTRAVENOUS | Status: DC

## 2022-11-09 MED ORDER — ATORVASTATIN CALCIUM 20 MG PO TABS
20.0000 mg | ORAL_TABLET | Freq: Every evening | ORAL | Status: DC
  Administered 2022-11-09 – 2022-11-11 (×3): 20 mg via ORAL
  Filled 2022-11-09 (×3): qty 1

## 2022-11-09 NOTE — ED Notes (Signed)
Pt in bed with eyes closed, resps even and unlabored, sig other at bedside, pt arouses easily, pt has no complaints.

## 2022-11-09 NOTE — H&P (Signed)
History and Physical    Bradley [name] ZOX:[id number] DOB: Nov 05, 1935 DOA: 11/09/2022  Referring MD/NP/PA:   PCP: Kandyce Rud, MD   Patient coming from:  The patient is coming from home.     Chief Complaint: Hematemesis, black and tarry stool  HPI: Bradley [name] is a 87 y.o. male with medical history significant of peptic ulcer disease, HTN, HDL, DM, BPH, kidney stone, anemia, OSA on CPAP, who presents with hematemesis, black and tarry stool.  Per his wife at the bedside, since yesterday afternoon, patient has had several episodes of hematemesis with black brown-colored material.  His stool is also black and tarry.  Initially patient did not have abdominal pain, currently has mild upper abdominal discomfort.  Patient has a fatigue, weakness and dizziness.  Denies chest pain, cough, shortness of breath.  No fever or chills.  Data reviewed independently and ED Course: pt was found to have hemoglobin 5.0 (13.0 on 07/31/2022), UA negative, negative ketone urinalysis, beta hydroxybutyric acid 0.51, WBC 14.7, INR 1.4, AKI with creatinine 1.54, BUN 94, GFR 44 (recent baseline creatinine 0.82), mild DKA (with blood sugar 422, anion gap 16, bicarbonate 16), temperature normal, blood pressure 132/97, heart rate 89, RR 17, oxygen saturation 98% on room air.  Patient is admitted to PCU as inpatient.  Dr. Mia Creek of GI is consulted.   EKG: I have personally reviewed.  Sinus rhythm, QTc 489, bifascicular block, poor R wave progression medical   Review of Systems:   General: no fevers, chills, no body weight gain, has fatigue HEENT: no blurry vision, hearing changes or sore throat Respiratory: no dyspnea, coughing, wheezing CV: no chest pain, no palpitations GI: Has hematemesis, tarry stool, upper abdominal discomfort GU: no dysuria, burning on urination, increased urinary frequency, hematuria  Ext: no leg edema Neuro: no unilateral weakness, numbness, or tingling, no vision change or  hearing loss.  Has tremor Skin: no rash, no skin tear. MSK: No muscle spasm, no deformity, no limitation of range of movement in spin Heme: No easy bruising.  Travel history: No recent long distant travel.   Allergy:  Allergies  Allergen Reactions   Sulfa Antibiotics     As infant   Penicillins Rash    Past Medical History:  Diagnosis Date   Anemia    BPH (benign prostatic hypertrophy)    Diabetes type 2, controlled (HCC)    GERD (gastroesophageal reflux disease)    RARE   Glaucoma    Syndor   Hearing loss    bilateral hearing aids occasionally   History of kidney stones    HLD (hyperlipidemia)    HTN (hypertension)    Macular degeneration    Right ([place])   Osteoarthritis    thumbs, hips, knees (s/p R TKR)   PUD (peptic ulcer disease) 03/2013   2 antral gastric ulcers and 1 duodenal ulcer, NSAID related, admitted to [facility] with melena/anemia s/p EGD   Sleep apnea    CPAP   Tremor    intention    Past Surgical History:  Procedure Laterality Date   BELPHAROPTOSIS REPAIR  2012   carotid US  12/2011   no significant stenosis   CATARACT EXTRACTION Bilateral 2011, 2012   CHOLECYSTECTOMY  1997   COLONOSCOPY  2009   per patient, told no longer needed   CYSTOSCOPY W/ URETERAL STENT PLACEMENT Left 11/01/2016   Procedure: CYSTOSCOPY WITH STENT REPLACEMENT;  Surgeon: [name], MD;  Location: [facility];  Service: Urology;  Laterality: Left;   CYSTOSCOPY WITH  STENT PLACEMENT Left 10/26/2016   Procedure: CYSTOSCOPY WITH STENT PLACEMENT;  Surgeon: [name], MD;  Location: [facility];  Service: Urology;  Laterality: Left;   ESOPHAGOGASTRODUODENOSCOPY  04/2013   hospitalization @ [facility], gastritis, gastric ulcers and duodenal ulcer with clean base ([place])   HOLEP-LASER ENUCLEATION OF THE PROSTATE WITH MORCELLATION N/A 11/01/2016   Procedure: HOLEP-LASER ENUCLEATION OF THE PROSTATE WITH MORCELLATION;  Surgeon: [name], MD;  Location: [facility] OR[facility];  Service: Urology;   Laterality: N/A;   KIDNEY STONE SURGERY  83, 87, 90   TOTAL KNEE ARTHROPLASTY Right 2006   URETEROSCOPY Left 10/26/2016   Procedure: URETEROSCOPY;  Surgeon: [name], MD;  Location: [facility] OR[facility];  Service: Urology;  Laterality: Left;   URETEROSCOPY WITH HOLMIUM LASER LITHOTRIPSY Left 11/01/2016   Procedure: URETEROSCOPY WITH HOLMIUM LASER LITHOTRIPSY;  Surgeon: [name], MD;  Location: [facility];  Service: Urology;  Laterality: Left;   US ECHOCARDIOGRAPHY  12/2011   nl LV fxn, EF 60%, nl valves, dilated aortic root (40mm)    Social History:  reports that he quit smoking about 24 years ago. His smoking use included cigarettes and cigars. He started smoking about 24 years ago. He has a 45.00 pack-year smoking history. He has never used smokeless tobacco. He reports that he does not currently use alcohol. He reports that he does not use drugs.  Family History:  Family History  Problem Relation Age of Onset   Cancer Sister        lung, nonsmoker   Cancer Sister        lung, nonsmoker   Dementia Mother    CAD Father        "heart problems"   CAD Brother 9       MI   Stroke Neg Hx    Prostate cancer Neg Hx    Bladder Cancer Neg Hx    Kidney cancer Neg Hx      Prior to Admission medications   Medication Sig Start Date End Date Taking? Authorizing Provider  acetaminophen (TYLENOL) 500 MG tablet Take 1,000 mg by mouth 2 (two) times daily.    [provider]  atorvastatin (LIPITOR) 20 MG tablet Take 20 mg by mouth every evening.     [provider]  Biotin w/ Vitamins C & E (HAIR/SKIN/NAILS PO) Take 1 tablet by mouth 2 (two) times daily.    [provider]  brimonidine-timolol (COMBIGAN) 0.2-0.5 % ophthalmic solution Place 1 drop into both eyes 2 (two) times daily.     [provider]  carbidopa-levodopa (SINEMET IR) 25-100 MG tablet Take 1 tablet by mouth at bedtime. Extended release 04/19/22   [provider]  finasteride (PROSCAR) 5 MG  tablet Take 5 mg by mouth every morning.     [provider]  glimepiride (AMARYL) 4 MG tablet Take 8 mg by mouth daily before breakfast.     [provider]  melatonin 3 MG TABS tablet TAKE TWO TABLETS/CAPSULES BY MOUTH AT BEDTIME 03/17/18   [provider]  memantine (NAMENDA) 10 MG tablet Take 10 mg by mouth 2 (two) times daily. 11/10/21   [provider]  metFORMIN (GLUCOPHAGE-XR) 500 MG 24 hr tablet Take 500 mg by mouth 2 (two) times daily.    [provider]  Multiple Vitamins-Minerals (PRESERVISION AREDS 2 PO) Take 1 tablet by mouth 2 (two) times daily.    [provider]  rOPINIRole (REQUIP) 0.5 MG tablet Take 1 mg by mouth 2 (two) times daily.  01/08/22   [provider]  sertraline (ZOLOFT) 25 MG tablet 1 tablet in the morning for 4 weeks, Then 2 tabs in morning thereafter 10/12/19   [provider]  valsartan-hydrochlorothiazide (DIOVAN-HCT) 160-25 MG per tablet Take by mouth every morning. Takes 1/2 tablet by mouth daily    [provider]    Physical Exam: Vitals:   11/09/22 1100 11/09/22 1130 11/09/22 1150 11/09/22 1217  BP: 108/66 118/61 111/65 114/65  Pulse: 90 85 83 87  Resp: (!) 23 (!) 25 (!) 23 (!) 24  Temp:    99.2 F (37.3 C)  TempSrc:    Oral  SpO2: 100% 100% 100% 100%  Weight:      Height:       General: Not in acute distress.  Pale looking HEENT: Has dried blood on his lips       Eyes: PERRL, EOMI, no jaundice       ENT: No discharge from the ears and nose, no pharynx injection, no tonsillar enlargement.        Neck: No JVD, no bruit, no mass felt. Heme: No neck lymph node enlargement. Cardiac: S1/S2, RRR, No murmurs, No gallops or rubs. Respiratory: No rales, wheezing, rhonchi or rubs. GI: Soft, nondistended, nontender, no rebound pain, no organomegaly, BS present. GU: No hematuria Ext: No pitting leg edema bilaterally. 1+DP/PT pulse bilaterally. Musculoskeletal: No joint  deformities, No joint redness or warmth, no limitation of ROM in spin. Skin: No rashes.  Neuro: Alert, oriented X3, cranial nerves II-XII grossly intact, moves all extremities.  Has resting tremor Psych: Patient is not psychotic, no suicidal or hemocidal ideation.  Labs on Admission: I have personally reviewed following labs and imaging studies  CBC: Recent Labs  Lab 11/09/22 0823 11/09/22 1027  WBC 14.7* 14.6*  NEUTROABS 12.9*  --   HGB 5.0* 5.2*  HCT 15.7* 16.1*  MCV 100.0 99.4  PLT 168 162   Basic Metabolic Panel: Recent Labs  Lab 11/09/22 0823 11/09/22 1027  NA 137 140  K 4.6 4.7  CL 105 110  CO2 16* 18*  GLUCOSE 422* 363*  BUN 94* 95*  CREATININE 1.54* 1.37*  CALCIUM 8.2* 8.2*   GFR: Estimated Creatinine Clearance: 35.6 mL/min (A) (by C-G formula based on SCr of 1.37 mg/dL (H)). Liver Function Tests: Recent Labs  Lab 11/09/22 0823  AST 33  ALT 26  ALKPHOS 30*  BILITOT 0.5  PROT 4.9*  ALBUMIN 2.9*   Recent Labs  Lab 11/09/22 0823  LIPASE 40   No results for input(s): "AMMONIA" in the last 168 hours. Coagulation Profile: Recent Labs  Lab 11/09/22 0823  INR 1.4*   Cardiac Enzymes: No results for input(s): "CKTOTAL", "CKMB", "CKMBINDEX", "TROPONINI" in the last 168 hours. BNP (last 3 results) No results for input(s): "PROBNP" in the last 8760 hours. HbA1C: No results for input(s): "HGBA1C" in the last 72 hours. CBG: Recent Labs  Lab 11/09/22 1126  GLUCAP 305*   Lipid Profile: No results for input(s): "CHOL", "HDL", "LDLCALC", "TRIG", "CHOLHDL", "LDLDIRECT" in the last 72 hours. Thyroid Function Tests: No results for input(s): "TSH", "T4TOTAL", "FREET4", "T3FREE", "THYROIDAB" in the last 72 hours. Anemia Panel: Recent Labs    11/09/22 0823  FOLATE 10.3  FERRITIN 26  TIBC 274  IRON 108  RETICCTPCT 4.3*   Urine analysis:    Component Value Date/Time   COLORURINE STRAW (A) 11/09/2022 0825   APPEARANCEUR CLEAR (A) 11/09/2022 0825    APPEARANCEUR Clear 12/03/2016 1115  LABSPEC 1.013 11/09/2022 0825   LABSPEC 1.020 04/22/2013 1032   PHURINE 5.0 11/09/2022 0825   GLUCOSEU >=500 (A) 11/09/2022 0825   GLUCOSEU Negative 04/22/2013 1032   HGBUR NEGATIVE 11/09/2022 0825   BILIRUBINUR NEGATIVE 11/09/2022 0825   BILIRUBINUR Negative 12/03/2016 1115   BILIRUBINUR Negative 04/22/2013 1032   KETONESUR NEGATIVE 11/09/2022 0825   PROTEINUR NEGATIVE 11/09/2022 0825   NITRITE NEGATIVE 11/09/2022 0825   LEUKOCYTESUR NEGATIVE 11/09/2022 0825   LEUKOCYTESUR Negative 04/22/2013 1032   Sepsis Labs: @LABRCNTIP (procalcitonin:4,lacticidven:4) )No results found for this or any previous visit (from the past 240 hour(s)).   Radiological Exams on Admission: No results found.    Assessment/Plan Principal Problem:   UGIB (upper gastrointestinal bleed) Active Problems:   Acute blood loss anemia   DKA (diabetic ketoacidosis) (HCC)   Diabetes mellitus without complication (HCC)   HTN (hypertension)   HLD (hyperlipidemia)   Parkinson disease   AKI (acute kidney injury) (HCC)   Benign prostatic hyperplasia   Leukocytosis   Assessment and Plan:  UGIB (upper gastrointestinal bleed) and acute blood loss anemia and hx of PUD: Hgb 13 --> 5.0. consulted Dr. Mia Creek of GI  - Admit to PCU as inpt - Hold ASA - transfuse 2 units of blood now - NPO for possible EGD - IVF: 1.5L NS bolus, then at 75 mL/hr - Start IV pantoprazole gtt - Check anemia panel - Zofran IV for nausea - Avoid NSAIDs and SQ heparin - Maintain IV access (2 large bore IVs if possible). - Monitor closely and follow q6h cbc, transfuse as necessary, if Hgb<7.0 - LaB: INR, PTT and type screen  DKA (diabetic ketoacidosis) (HCC): Patient seems to have mild DKA with blood sugar 422, anion gap 16 and bicarbonate 16, beta hydroxybutyric acid 0.51, blood ketones negative in urine.  Mental status is normal. We treated with patient with IV fluid with 1.5 L normal saline, and  10 units of NovoLog, then repeated BMP, his anion gap is closed at 12. Will not need insulin gtt -start glargine insulin 8 units daily -SSI  Diabetes mellitus without complication [facility]): Recent A1c 7.1, poorly controlled.  Patient is taking Amaryl and metformin at home.  - see above  HLD (hyperlipidemia) -Lipitor  Parkinson disease -Fall precaution -Sinemet  AKI (acute kidney injury) (HCC): Likely due to dehydration and continuation of Diovan -IV fluid as above -Hold Diovan  Benign prostatic hyperplasia -Proscar  Leukocytosis: WBC 14.7, no fever, no source of infection identified so far, likely reactive -Follow-up urinalysis  HTN: -Hold Diovan due to AKI. -IV hydralazine as needed.    DVT ppx: SCD  Code Status: Full code ( I discussed with patient in the presence of his wife, explained the meaning of CODE STATUS, patient wants to be full code)  Family Communication:  Yes, patient's wife at bed side.     Disposition Plan:  Anticipate discharge back to previous environment  Consults called:  Dr. Mia Creek of GI is consulted.  Admission status and Level of care: Progressive:   as inpt      Dispo: The patient is from: Home              Anticipated d/c is to: Home              Anticipated d/c date is: 2 days              Patient currently is not medically stable to d/c.    Severity of Illness:  The appropriate patient status  for this patient is INPATIENT. Inpatient status is judged to be reasonable and necessary in order to provide the required intensity of service to ensure the patient's safety. The patient's presenting symptoms, physical exam findings, and initial radiographic and laboratory data in the context of their chronic comorbidities is felt to place them at high risk for further clinical deterioration. Furthermore, it is not anticipated that the patient will be medically stable for discharge from the hospital within 2 midnights of admission.   * I certify  that at the point of admission it is my clinical judgment that the patient will require inpatient hospital care spanning beyond 2 midnights from the point of admission due to high intensity of service, high risk for further deterioration and high frequency of surveillance required.*       Date of Service 11/09/2022    Lorretta Harp [facility]   If 7PM-7AM, please contact night-coverage [email or web address] 11/09/2022, 12:31 PM

## 2022-11-09 NOTE — ED Notes (Signed)
Pt in bed, pt has no complaints at this time.  Resps even and unlabored, pt remains on monitor.

## 2022-11-09 NOTE — ED Provider Notes (Addendum)
[facility] Provider Note    Event Date/Time   First MD Initiated Contact with Patient 11/09/22 [phone]     (approximate)   History   Chief Complaint: GI Bleeding   HPI  Bradley [name] is a 87 y.o. male with a past history of hypertension, diabetes, Parkinson's disease, peptic ulcer disease who comes to the ED due to hematemesis that started last night.  Patient reports black tarry stool for the last 2 days along with dizziness with standing and severe fatigue.  Denies any pain.  Request water.  EMS gave minimal IV fluids prior to arrival in the ED.  Patient denies blood thinners.  Denies NSAIDs or steroids.     Physical Exam   Triage Vital Signs: ED Triage Vitals  Enc Vitals Group     BP      Pulse      Resp      Temp      Temp src      SpO2      Weight      Height      Head Circumference      Peak Flow      Pain Score      Pain Loc      Pain Edu?      Excl. in GC?     Most recent vital signs: Vitals:   11/09/22 0810  BP: (!) 132/97  Pulse: 89  Resp: 17  Temp: 98.1 F (36.7 C)  SpO2: 98%    General: Awake, no distress.  Pale appearance CV:  Good peripheral perfusion.  Regular rate and rhythm, heart rate 80 Resp:  Normal effort.  Clear to auscultation bilaterally Abd:  No distention.  Soft with mild epigastric and left upper quadrant tenderness.  No peritonitis.  Rectal exam reveals melanotic stool, strongly Hemoccult positive. Other:  Dried blood around the lips.  Pronounced subconjunctival pallor   ED Results / Procedures / Treatments   Labs (all labs ordered are listed, but only abnormal results are displayed) Labs Reviewed  COMPREHENSIVE METABOLIC PANEL - Abnormal; Notable for the following components:      Result Value   CO2 16 (*)    Glucose, Bld 422 (*)    BUN 94 (*)    Creatinine, Ser 1.54 (*)    Calcium 8.2 (*)    Total Protein 4.9 (*)    Albumin 2.9 (*)    Alkaline Phosphatase 30 (*)    GFR, Estimated 44  (*)    Anion gap 16 (*)    All other components within normal limits  CBC WITH DIFFERENTIAL/PLATELET - Abnormal; Notable for the following components:   WBC 14.7 (*)    RBC 1.57 (*)    Hemoglobin 5.0 (*)    HCT 15.7 (*)    Neutro Abs 12.9 (*)    Abs Immature Granulocytes 0.16 (*)    All other components within normal limits  PROTIME-INR - Abnormal; Notable for the following components:   Prothrombin Time 17.0 (*)    INR 1.4 (*)    All other components within normal limits  LIPASE, BLOOD  URINALYSIS, W/ REFLEX TO CULTURE (INFECTION SUSPECTED)  TYPE AND SCREEN  PREPARE RBC (CROSSMATCH)  ABO/RH     EKG Interpreted by me Sinus rhythm rate of 93.  Left axis, right bundle branch block.  No acute ischemic changes.   RADIOLOGY    PROCEDURES:  .Critical Care  Performed by: Sharman Cheek, MD Authorized by: Sharman Cheek,  MD   Critical care provider statement:    Critical care time (minutes):  35   Critical care was necessary to treat or prevent imminent or life-threatening deterioration of the following conditions:  Circulatory failure and shock   Critical care was time spent personally by me on the following activities:  Development of treatment plan with patient or surrogate, discussions with consultants, evaluation of patient's response to treatment, examination of patient, ordering and review of laboratory studies, ordering and review of radiographic studies, ordering and performing treatments and interventions, pulse oximetry, re-evaluation of patient's condition and review of old charts   Care discussed with: admitting provider      MEDICATIONS ORDERED IN ED: Medications  0.9 %  sodium chloride infusion (has no administration in time range)  sodium chloride 0.9 % bolus 1,000 mL (1,000 mLs Intravenous New Bag/Given 11/09/22 0839)  sodium chloride 0.9 % bolus 500 mL (500 mLs Intravenous Given by EMS 11/09/22 0832)  pantoprazole (PROTONIX) injection 40 mg (40 mg  Intravenous Given 11/09/22 0842)  ondansetron (ZOFRAN) injection 4 mg (4 mg Intravenous Given 11/09/22 0840)     IMPRESSION / MDM / ASSESSMENT AND PLAN / ED COURSE  I reviewed the triage vital signs and the nursing notes.  DDx: Upper GI bleed, symptomatic anemia, electrolyte abnormality, AKI  Patient's presentation is most consistent with acute presentation with potential threat to life or bodily function.  Patient presents with clinically apparent upper GI bleed.  Will give Zofran, Protonix, IV fluids.  Will check labs, considering transfusion.  Currently hemodynamics are stable, blood pressure 130/90 on arrival to the ED.  Doubt sepsis.  Will need to admit for further management.   ----------------------------------------- 9:10 AM on 11/09/2022 ----------------------------------------- Hemoglobin is 5.  Vitals remain stable.  Will order 2 units packed red blood cells.  Other labs show AKI.  No reversible coagulopathy.   ----------------------------------------- 9:26 AM on 11/09/2022 ----------------------------------------- Case discussed with hospitalist     FINAL CLINICAL IMPRESSION(S) / ED DIAGNOSES   Final diagnoses:  UGIB (upper gastrointestinal bleed)  AKI (acute kidney injury) (HCC)  Symptomatic anemia  Type 2 diabetes mellitus with hyperglycemia, without long-term current use of insulin (HCC)     Rx / DC Orders   ED Discharge Orders     None        Note:  This document was prepared using Dragon voice recognition software and may include unintentional dictation errors.   Sharman Cheek, MD 11/09/22 1610    Sharman Cheek, MD 11/09/22 [phone]

## 2022-11-09 NOTE — Progress Notes (Signed)
CPAP at bedtime ordered, pt stated he hasn't wore one in years since he lost weight and refused to wear one while here in hospital.

## 2022-11-09 NOTE — ED Notes (Signed)
Pt in bed, pt has no complaints at this time.  

## 2022-11-09 NOTE — Consult Note (Signed)
Consultation  Referring Provider:   Dr Clyde Lundborg   Admit date 11/09/22 Consult date        11/09/22 Reason for Consultation:     hematemesis/melena/acute anemia         HPI:   Bradley [name] is a 87 y.o. male with medical history significant of PUD, parkinsons disease, HTN, HDL, DM, BPH, kidney stone, anemia, OSA on CPAP, [facility] 3/24 who presented today with hematemesis/melena x2d- was feeling well prior to onset of symptoms. No known trigger. Denies nsaids other than low dose asa. No hx liver disease, denies etoh. Admit hemoglobin 5, BUN 94, creatnine 1.54 and GFR 44. Glucose in the 400s, negative ketonuria. Transfusion has been ordered. He has been started on pantoprazole, fluids, and antiemetics. States he is feeling better now- last stool and emesis episode yesterday. He denies any other GI concerns. Denies any family history of colorectal cancer/PUD. He is living at [facility]. At one point he was on PPI as o/p but does not seem to be taking this now.  PREVIOUS ENDOSCOPIES:           EGD 04/2013- Dr Shelle Iron (melena- likely nsaid related)-  2 antral gastric ulcers and one ulcer in the duodenal bulb. There was no h pylori, metaplasia, dysplasia, malignancy Colonoscopy 2009- states no significant findings.   Past Medical History:  Diagnosis Date   Anemia    BPH (benign prostatic hypertrophy)    Diabetes type 2, controlled (HCC)    GERD (gastroesophageal reflux disease)    RARE   Glaucoma    Syndor   Hearing loss    bilateral hearing aids occasionally   History of kidney stones    HLD (hyperlipidemia)    HTN (hypertension)    Macular degeneration    Right ([place])   Osteoarthritis    thumbs, hips, knees (s/p R TKR)   PUD (peptic ulcer disease) 03/2013   2 antral gastric ulcers and 1 duodenal ulcer, NSAID related, admitted to [facility] with melena/anemia s/p EGD   Sleep apnea    CPAP   Tremor    intention    Past Surgical History:  Procedure Laterality Date   BELPHAROPTOSIS REPAIR   2012   carotid US  12/2011   no significant stenosis   CATARACT EXTRACTION Bilateral 2011, 2012   CHOLECYSTECTOMY  1997   COLONOSCOPY  2009   per patient, told no longer needed   CYSTOSCOPY W/ URETERAL STENT PLACEMENT Left 11/01/2016   Procedure: CYSTOSCOPY WITH STENT REPLACEMENT;  Surgeon: [name], MD;  Location: [facility] ORS;  Service: Urology;  Laterality: Left;   CYSTOSCOPY WITH STENT PLACEMENT Left 10/26/2016   Procedure: CYSTOSCOPY WITH STENT PLACEMENT;  Surgeon: [name], MD;  Location: [facility] ORS;  Service: Urology;  Laterality: Left;   ESOPHAGOGASTRODUODENOSCOPY  04/2013   hospitalization @ [facility], gastritis, gastric ulcers and duodenal ulcer with clean base ([place])   HOLEP-LASER ENUCLEATION OF THE PROSTATE WITH MORCELLATION N/A 11/01/2016   Procedure: HOLEP-LASER ENUCLEATION OF THE PROSTATE WITH MORCELLATION;  Surgeon: [name], MD;  Location: [facility] ORS;  Service: Urology;  Laterality: N/A;   KIDNEY STONE SURGERY  85, 89, 89   TOTAL KNEE ARTHROPLASTY Right 2006   URETEROSCOPY Left 10/26/2016   Procedure: URETEROSCOPY;  Surgeon: [name], MD;  Location: [facility] ORS;  Service: Urology;  Laterality: Left;   URETEROSCOPY WITH HOLMIUM LASER LITHOTRIPSY Left 11/01/2016   Procedure: URETEROSCOPY WITH HOLMIUM LASER LITHOTRIPSY;  Surgeon: [name], MD;  Location: [facility] ORS;  Service: Urology;  Laterality:  Left;   US ECHOCARDIOGRAPHY  12/2011   nl LV fxn, EF 60%, nl valves, dilated aortic root (40mm)    Family History  Problem Relation Age of Onset   Cancer Sister        lung, nonsmoker   Cancer Sister        lung, nonsmoker   Dementia Mother    CAD Father        "heart problems"   CAD Brother 34       MI   Stroke Neg Hx    Prostate cancer Neg Hx    Bladder Cancer Neg Hx    Kidney cancer Neg Hx      Social History   Tobacco Use   Smoking status: Former    Packs/day: 1.00    Years: 45.00    Additional pack years: 0.00    Total pack years: 45.00    Types:  Cigarettes, Cigars    Start date: 05/24/1998    Quit date: 10/23/1998    Years since quitting: 24.0   Smokeless tobacco: Never  Substance Use Topics   Alcohol use: Not Currently    Comment: Occasional, maybe 5-6 per year   Drug use: No    Prior to Admission medications   Medication Sig Start Date End Date Taking? Authorizing Provider  acetaminophen (TYLENOL) 500 MG tablet Take 1,000 mg by mouth daily as needed for headache, fever, moderate pain or mild pain.   Yes [provider]  albuterol (VENTOLIN HFA) 108 (90 Base) MCG/ACT inhaler Inhale 1-2 puffs into the lungs every 4 (four) hours as needed for shortness of breath or wheezing.   Yes [provider]  aspirin EC (ASPIRIN 81) 81 MG tablet Take 81 mg by mouth daily.   Yes [provider]  atorvastatin (LIPITOR) 20 MG tablet Take 20 mg by mouth every evening.    Yes [provider]  Biotin w/ Vitamins C & E (HAIR/SKIN/NAILS PO) Take 1,666 mg by mouth 2 (two) times daily.   Yes [provider]  brimonidine-timolol (COMBIGAN) 0.2-0.5 % ophthalmic solution Place 1 drop into both eyes 2 (two) times daily.    Yes [provider]  buPROPion (WELLBUTRIN XL) 150 MG 24 hr tablet Take 150 mg by mouth every morning.   Yes [provider]  carbidopa-levodopa (SINEMET IR) 25-100 MG tablet Take 1.5 tablets by mouth 4 (four) times daily. Extended release Taken at 0730, [id number], 1500 and 1830 04/19/22  Yes [provider]  Cholecalciferol (VITAMIN D3) 1000 units CAPS Take 1,000 Units by mouth 2 (two) times daily.   Yes [provider]  docusate sodium (COLACE) 100 MG capsule Take 100 mg by mouth 2 (two) times daily.   Yes [provider]  finasteride (PROSCAR) 5 MG tablet Take 5 mg by mouth every morning.    Yes [provider]  ipratropium (ATROVENT) 0.06 % nasal spray Place 1 spray into both nostrils 2 (two) times daily as needed for rhinitis. 10/01/22 10/01/23 Yes  [provider]  Melatonin 10 MG TABS Take 10 mg by mouth at bedtime. 03/17/18  Yes [provider]  metFORMIN (GLUCOPHAGE-XR) 500 MG 24 hr tablet Take 500 mg by mouth 2 (two) times daily.   Yes [provider]  Multiple Vitamins-Minerals (PRESERVISION AREDS 2 PO) Take 1 tablet by mouth 2 (two) times daily.   Yes [provider]  polyethylene glycol powder (GLYCOLAX/MIRALAX) 17 GM/SCOOP powder Take 17 g by mouth daily.   Yes  [provider]  rOPINIRole (REQUIP) 0.5 MG tablet Take 0.5 mg by mouth QID. 8 am, 1130, 1500, 1830 01/08/22  Yes [provider]  sertraline (ZOLOFT) 100 MG tablet Take 100 mg by mouth daily. 10/12/19  Yes [provider]  valsartan (DIOVAN) 80 MG tablet Take 40 mg by mouth daily. 06/21/22  Yes [provider]    Current Facility-Administered Medications  Medication Dose Route Frequency Provider Last Rate Last Admin   0.9 %  sodium chloride infusion   Intravenous Continuous Lorretta Harp, MD 75 mL/hr at 11/09/22 1012 New Bag at 11/09/22 1012   acetaminophen (TYLENOL) tablet 650 mg  650 mg Oral Q6H PRN Lorretta Harp, MD       albuterol (PROVENTIL) (2.5 MG/3ML) 0.083% nebulizer solution 3 mL  3 mL Inhalation Q4H PRN Lorretta Harp, MD       atorvastatin (LIPITOR) tablet 20 mg  20 mg Oral QPM Lorretta Harp, MD       brimonidine-timolol (COMBIGAN) 0.2-0.5 % ophthalmic solution 1 drop  1 drop Both Eyes BID Lorretta Harp, MD       buPROPion (WELLBUTRIN XL) 24 hr tablet 150 mg  150 mg Oral q morning Lorretta Harp, MD   150 mg at 11/09/22 1141   carbidopa-levodopa (SINEMET IR) 25-100 MG per tablet immediate release 1.5 tablet  1.5 tablet Oral QID Lorretta Harp, MD   1.5 tablet at 11/09/22 1141   cholecalciferol (VITAMIN D3) 25 MCG (1000 UNIT) tablet 1,000 Units  1,000 Units Oral BID Lorretta Harp, MD   1,000 Units at 11/09/22 1142   docusate sodium (COLACE) capsule 100 mg  100 mg Oral BID PRN Lorretta Harp, MD       finasteride (PROSCAR)  tablet 5 mg  5 mg Oral Daily Lorretta Harp, MD   5 mg at 11/09/22 1144   hydrALAZINE (APRESOLINE) injection 5 mg  5 mg Intravenous Q2H PRN Lorretta Harp, MD       insulin aspart (novoLOG) injection 0-5 Units  0-5 Units Subcutaneous QHS Lorretta Harp, MD       insulin aspart (novoLOG) injection 0-9 Units  0-9 Units Subcutaneous TID WC Lorretta Harp, MD   7 Units at 11/09/22 1139   insulin glargine-yfgn (SEMGLEE) injection 8 Units  8 Units Subcutaneous Daily Lorretta Harp, MD   8 Units at 11/09/22 1218   ipratropium (ATROVENT) 0.06 % nasal spray 1 spray  1 spray Each Nare BID PRN Lorretta Harp, MD       melatonin tablet 10 mg  10 mg Oral QHS Lorretta Harp, MD       ondansetron [facility]) injection 4 mg  4 mg Intravenous Q8H PRN Lorretta Harp, MD       [START ON 11/12/2022] pantoprazole (PROTONIX) injection 40 mg  40 mg Intravenous Q12H Lorretta Harp, MD       pantoprozole (PROTONIX) 80 mg /NS 100 mL infusion  8 mg/hr Intravenous Continuous Lorretta Harp, MD 10 mL/hr at 11/09/22 0957 8 mg/hr at 11/09/22 0957   rOPINIRole (REQUIP) tablet 0.5 mg  0.5 mg Oral QID Lorretta Harp, MD   0.5 mg at 11/09/22 1144   sertraline (ZOLOFT) tablet 100 mg  100 mg Oral Daily Lorretta Harp, MD   100 mg at 11/09/22 1145   Current Outpatient Medications  Medication Sig Dispense Refill   acetaminophen (TYLENOL) 500 MG tablet Take 1,000 mg by mouth daily as needed for headache, fever, moderate pain or mild pain.     albuterol (VENTOLIN HFA) 108 (90 Base)  MCG/ACT inhaler Inhale 1-2 puffs into the lungs every 4 (four) hours as needed for shortness of breath or wheezing.     aspirin EC (ASPIRIN 81) 81 MG tablet Take 81 mg by mouth daily.     atorvastatin (LIPITOR) 20 MG tablet Take 20 mg by mouth every evening.      Biotin w/ Vitamins C & E (HAIR/SKIN/NAILS PO) Take 1,666 mg by mouth 2 (two) times daily.     brimonidine-timolol (COMBIGAN) 0.2-0.5 % ophthalmic solution Place 1 drop into both eyes 2 (two) times daily.      buPROPion (WELLBUTRIN XL) 150 MG 24 hr  tablet Take 150 mg by mouth every morning.     carbidopa-levodopa (SINEMET IR) 25-100 MG tablet Take 1.5 tablets by mouth 4 (four) times daily. Extended release Taken at 0730, 1130, 1500 and 1830     Cholecalciferol (VITAMIN D3) 1000 units CAPS Take 1,000 Units by mouth 2 (two) times daily.     docusate sodium (COLACE) 100 MG capsule Take 100 mg by mouth 2 (two) times daily.     finasteride (PROSCAR) 5 MG tablet Take 5 mg by mouth every morning.      ipratropium (ATROVENT) 0.06 % nasal spray Place 1 spray into both nostrils 2 (two) times daily as needed for rhinitis.     Melatonin 10 MG TABS Take 10 mg by mouth at bedtime.     metFORMIN (GLUCOPHAGE-XR) 500 MG 24 hr tablet Take 500 mg by mouth 2 (two) times daily.     Multiple Vitamins-Minerals (PRESERVISION AREDS 2 PO) Take 1 tablet by mouth 2 (two) times daily.     polyethylene glycol powder (GLYCOLAX/MIRALAX) 17 GM/SCOOP powder Take 17 g by mouth daily.     rOPINIRole (REQUIP) 0.5 MG tablet Take 0.5 mg by mouth QID. 8 am, 1130, 1500, 1830     sertraline (ZOLOFT) 100 MG tablet Take 100 mg by mouth daily.     valsartan (DIOVAN) 80 MG tablet Take 40 mg by mouth daily.      Allergies as of 11/09/2022 - Review Complete 11/09/2022  Allergen Reaction Noted   Sulfa antibiotics  10/21/2012   Penicillins Rash 10/21/2012     Review of Systems:    All systems reviewed and negative except where noted in HPI with exception of bradykinesia, intermittent dizziness, memory loss, right leg parathesias. He follows with Dr Sherryll Burger for PD/possible dementia.      Physical Exam:  Vital signs in last 24 hours: Temp:  [97.9 F (36.6 C)-99.2 F (37.3 C)] 99.2 F (37.3 C) (06/18 1217) Pulse Rate:  [83-216] 86 (06/18 1237) Resp:  [17-25] 23 (06/18 1237) BP: (107-132)/(57-97) 110/57 (06/18 1236) SpO2:  [83 %-100 %] 100 % (06/18 1237) Weight:  [73.5 kg] 73.5 kg (06/18 0817)   General:   Pleasant man in NAD Head:  Normocephalic and atraumatic. Eyes:   No  icterus.   Conjunctiva pink. Ears:  Normal auditory acuity. Mouth: Mucosa pink moist, no lesions. Neck:  Supple; no masses felt Lungs:  Respirations even and unlabored. Lungs clear to auscultation bilaterally.   No wheezes, crackles, or rhonchi.  Heart:  S1S2, RRR, no MRG. Trace generalized edema. Abdomen:   Flat, soft, nondistended, nontender. Normal bowel sounds. No appreciable masses or hepatomegaly. No rebound signs or other peritoneal signs. Rectal:  Not performed.  Msk:  MAEW x4, No clubbing or cyanosis. Strength 4/5. Symmetrical without gross deformities. Neurologic:  Alert and  oriented x4;  Cranial nerves II-XII intact. There is some  mild short term memory loss Skin:  Warm, dry, pale pink without significant lesions or rashes. Psych:  Alert and cooperative. Normal affect.  LAB RESULTS: Recent Labs    11/09/22 0823 11/09/22 1027  WBC 14.7* 14.6*  HGB 5.0* 5.2*  HCT 15.7* 16.1*  PLT 168 162   BMET Recent Labs    11/09/22 0823 11/09/22 1027  NA 137 140  K 4.6 4.7  CL 105 110  CO2 16* 18*  GLUCOSE 422* 363*  BUN 94* 95*  CREATININE 1.54* 1.37*  CALCIUM 8.2* 8.2*   LFT Recent Labs    11/09/22 0823  PROT 4.9*  ALBUMIN 2.9*  AST 33  ALT 26  ALKPHOS 30*  BILITOT 0.5   PT/INR Recent Labs    11/09/22 0823  LABPROT 17.0*  INR 1.4*    STUDIES: No results found.     Impression / Plan:   Hematemesis/Melena/anemia/history of PUD. Agree with transfusing to keep hgb >7, pantoprazole and current treatment. Have discussed EGD with him- would like his hemoglobin and metabolic derangements to be improved- he is hemodynamically stable and no active bleeding signs since later yesterday   Thank you very much for this consult. These services were provided by Vevelyn Pat, NP-C, in collaboration with Regis Bill, MD, with whom I have discussed this patient in full.   Vevelyn Pat, NP-C

## 2022-11-09 NOTE — ED Triage Notes (Signed)
Pt to er via ems, per ems pt is here for black tarry stools and vomiting blood, pt appears to have some dried blood around his mouth, pt is pale, skin is dry.  Pt awake and answering questions appropriately.  Pt oriented times three.

## 2022-11-10 ENCOUNTER — Inpatient Hospital Stay: Payer: Medicare Other | Admitting: Anesthesiology

## 2022-11-10 ENCOUNTER — Encounter
Admission: EM | Disposition: A | Discharge status: Nursing Home (Includes ICF) | Payer: Self-pay | Source: Skilled Nursing Facility | Attending: Internal Medicine

## 2022-11-10 DIAGNOSIS — K922 Gastrointestinal hemorrhage, unspecified: Secondary | ICD-10-CM | POA: Diagnosis not present

## 2022-11-10 HISTORY — PX: ESOPHAGOGASTRODUODENOSCOPY (EGD) WITH PROPOFOL: SHX5813

## 2022-11-10 HISTORY — PX: BIOPSY: SHX5522

## 2022-11-10 LAB — BASIC METABOLIC PANEL
Anion gap: 6 (ref 5–15)
BUN: 48 mg/dL — ABNORMAL HIGH (ref 8–23)
CO2: 23 mmol/L (ref 22–32)
Calcium: 7.6 mg/dL — ABNORMAL LOW (ref 8.9–10.3)
Chloride: 110 mmol/L (ref 98–111)
Creatinine, Ser: 0.91 mg/dL (ref 0.61–1.24)
GFR, Estimated: >60 mL/min (ref >60)
Glucose, Bld: 112 mg/dL — ABNORMAL HIGH (ref 70–99)
Potassium: 3.1 mmol/L — ABNORMAL LOW (ref 3.5–5.1)
Sodium: 139 mmol/L (ref 135–145)

## 2022-11-10 LAB — CBC
HCT: 19.9 % — ABNORMAL LOW (ref 39.0–52.0)
HCT: 19 % — ABNORMAL LOW (ref 39.0–52.0)
Hemoglobin: 6.5 g/dL — ABNORMAL LOW (ref 13.0–17.0)
Hemoglobin: 6.3 g/dL — ABNORMAL LOW (ref 13.0–17.0)
MCH: 31.7 pg (ref 26.0–34.0)
MCH: 31.7 pg (ref 26.0–34.0)
MCHC: 32.7 g/dL (ref 30.0–36.0)
MCHC: 33.2 g/dL (ref 30.0–36.0)
MCV: 97.1 fL (ref 80.0–100.0)
MCV: 95.5 fL (ref 80.0–100.0)
Platelets: 129 10*3/uL — ABNORMAL LOW (ref 150–400)
Platelets: 135 10*3/uL — ABNORMAL LOW (ref 150–400)
RBC: 2.05 MIL/uL — ABNORMAL LOW (ref 4.22–5.81)
RBC: 1.99 MIL/uL — ABNORMAL LOW (ref 4.22–5.81)
RDW: 15.3 % (ref 11.5–15.5)
RDW: 15.6 % — ABNORMAL HIGH (ref 11.5–15.5)
WBC: 14.8 10*3/uL — ABNORMAL HIGH (ref 4.0–10.5)
WBC: 11 10*3/uL — ABNORMAL HIGH (ref 4.0–10.5)
nRBC: 0.1 % (ref 0.0–0.2)
nRBC: 0.2 % (ref 0.0–0.2)

## 2022-11-10 LAB — HEMOGLOBIN AND HEMATOCRIT, BLOOD
HCT: 22.3 % — ABNORMAL LOW (ref 39.0–52.0)
Hemoglobin: 7.4 g/dL — ABNORMAL LOW (ref 13.0–17.0)

## 2022-11-10 LAB — GLUCOSE, CAPILLARY
Glucose-Capillary: 96 mg/dL (ref 70–99)
Glucose-Capillary: 106 mg/dL — ABNORMAL HIGH (ref 70–99)
Glucose-Capillary: 114 mg/dL — ABNORMAL HIGH (ref 70–99)
Glucose-Capillary: 171 mg/dL — ABNORMAL HIGH (ref 70–99)
Glucose-Capillary: 165 mg/dL — ABNORMAL HIGH (ref 70–99)

## 2022-11-10 LAB — TYPE AND SCREEN
Antibody Screen: NEGATIVE
Unit division: 0
Unit division: 0

## 2022-11-10 LAB — BPAM RBC
Blood Product Expiration Date: 202407162359
Blood Product Expiration Date: 202407162359
ISSUE DATE / TIME: 202406181002
ISSUE DATE / TIME: 202406191154

## 2022-11-10 LAB — PREPARE RBC (CROSSMATCH)

## 2022-11-10 SURGERY — ESOPHAGOGASTRODUODENOSCOPY (EGD) WITH PROPOFOL
Anesthesia: General

## 2022-11-10 MED ORDER — PROPOFOL 10 MG/ML IV BOLUS
INTRAVENOUS | Status: DC | PRN
  Administered 2022-11-10: 80 mg via INTRAVENOUS

## 2022-11-10 MED ORDER — PANTOPRAZOLE SODIUM 40 MG IV SOLR: 40.0000 mg | Freq: Two times a day (BID) | INTRAVENOUS | Status: DC

## 2022-11-10 MED ORDER — SODIUM CHLORIDE 0.9 % IV SOLN: INTRAVENOUS | Status: DC

## 2022-11-10 MED ORDER — PANTOPRAZOLE SODIUM 40 MG PO TBEC
40.0000 mg | DELAYED_RELEASE_TABLET | Freq: Two times a day (BID) | ORAL | Status: DC
  Administered 2022-11-10 – 2022-11-12 (×3): 40 mg via ORAL
  Filled 2022-11-10 (×4): qty 1

## 2022-11-10 MED ORDER — ORAL CARE MOUTH RINSE: 15.0000 mL | OROMUCOSAL | Status: DC | PRN

## 2022-11-10 MED ORDER — POTASSIUM CHLORIDE CRYS ER 20 MEQ PO TBCR
40.0000 meq | EXTENDED_RELEASE_TABLET | Freq: Once | ORAL | Status: AC
  Administered 2022-11-10: 40 meq via ORAL
  Filled 2022-11-10: qty 2

## 2022-11-10 MED ORDER — PROPOFOL 500 MG/50ML IV EMUL
INTRAVENOUS | Status: DC | PRN
  Administered 2022-11-10: 100 ug/kg/min via INTRAVENOUS

## 2022-11-10 MED ORDER — LIDOCAINE HCL (CARDIAC) PF 100 MG/5ML IV SOSY
PREFILLED_SYRINGE | INTRAVENOUS | Status: DC | PRN
  Administered 2022-11-10: 40 mg via INTRAVENOUS

## 2022-11-10 MED ORDER — SODIUM CHLORIDE 0.9% IV SOLUTION: Freq: Once | INTRAVENOUS | Status: AC

## 2022-11-10 NOTE — Transfer of Care (Signed)
Immediate Anesthesia Transfer of Care Note  Patient: Christinia Gully  Procedure(s) Performed: ESOPHAGOGASTRODUODENOSCOPY (EGD) WITH PROPOFOL  Patient Location: PACU and Endoscopy Unit  Anesthesia Type:General  Level of Consciousness: drowsy  Airway & Oxygen Therapy: Patient Spontanous Breathing  Post-op Assessment: Report given to RN and Post -op Vital signs reviewed and stable  Post vital signs: Reviewed and stable  Last Vitals:  Vitals Value Taken Time  BP 98/56 11/10/22 1629  Temp    Pulse 68 11/10/22 1629  Resp 17 11/10/22 1629  SpO2 95 % 11/10/22 1629  Vitals shown include unvalidated device data.  Last Pain:  Vitals:   11/10/22 1423  TempSrc: Oral  PainSc:       Patients Stated Pain Goal: 3 (11/09/22 2114)  Complications: No notable events documented.

## 2022-11-10 NOTE — Progress Notes (Addendum)
PROGRESS NOTE    Bradley [name]   OZD:[id number] DOB: [birth year]  DOA: 11/09/2022 Date of Service: 11/10/22 PCP: Kandyce Rud, MD     Brief Narrative / Hospital Course:  Bradley [name] is a 87 y.o. male with medical history significant of peptic ulcer disease, HTN, HDL, DM, BPH, kidney stone, anemia, OSA on CPAP, who presents to ED 11/09/2022 with hematemesis, black and tarry stool.  06/18: hemoglobin 5.0 (13.0 on 07/31/2022) --> 2 unit PRBC --> 7.1, AKI with creatinine 1.54, mild DKA (with blood sugar 422, anion gap 16, bicarbonate 16). Per GI - plan EGD tomorrow  06/19: Hgb 6.5 --> 1 unit PRBC --> 7.4. EGD small ulcres no intervention needed, biopsied. Resume solid diet. Resume ASA 11/13/22. Will need PPI po bid x8 weeks then daily. Assure Hgb stable tomorrow prior to discharge.   Consultants:  GI  Procedures: 11/10/22 EGD - Dr Mia Creek       ASSESSMENT & PLAN:   Principal Problem:   UGIB (upper gastrointestinal bleed) Active Problems:   Acute blood loss anemia   DKA (diabetic ketoacidosis) (HCC)   Diabetes mellitus without complication (HCC)   HTN (hypertension)   HLD (hyperlipidemia)   Parkinson disease   AKI (acute kidney injury) (HCC)   Benign prostatic hyperplasia   Leukocytosis  UGIB (upper gastrointestinal bleed) and acute blood loss anemia  Small ulcers in stomach and duodenum on EGD Hgb 13 --> 5.0 S/p 3 unit PRBC Hold ASA x3 more days Another unit PRBC this morning  IV PPI gtt --> po PPI bid x8 weeks then daily  IV fluidd --> can resume solids  AM CBC if appropriate can d/c    DKA (diabetic ketoacidosis) (HCC) POA: resolved DM2 treated with IV fluid with 1.5 L normal saline, and 10 units of NovoLog, then repeated BMP, anion gap closed at 12. Will not need insulin gtt start glargine insulin 8 units daily - holding this morning while NPO SSI   HLD (hyperlipidemia) Lipitor   Parkinson disease Fall precaution Sinemet   AKI (acute  kidney injury) (HCC): resolved  Likely due to dehydration and continuation of Diovan Hold Diovan BMP   Benign prostatic hyperplasia Proscar   Leukocytosis: improving  WBC 14.7, no fever, no source of infection identified so far, likely reactive Follow-up CBC    Essential HTN: BP here has been low  Hold Diovan due to AKI and low BP IV hydralazine as needed.   Hypokalemia Likely d/t insulin admin  Replace as needed Monitor BMP   DVT prophylaxis: SCD Pertinent IV fluids/nutrition: NPO pend EGD, meds/sips ok, NS 75 mL/h  Central lines / invasive devices: none   Code Status: FULL CODE ACP documentation reviewed: none on file   Current Admission Status: inpatient   TOC needs / Dispo plan: TBD, PT/OT eval following EGD  Barriers to discharge / significant pending items: GI bleed, anemia, pendign EGD              Subjective / Brief ROS:  Patient reports feeling thirsty but no other complaints  Denies CP/SOB.  Pain controlled.  Denies new weakness.  Reports no concerns w/ urination/defecation.   Family Communication: none at this time, will make sure they are updated following EGD     Objective Findings:  Vitals:   11/10/22 1423 11/10/22 1559 11/10/22 1629 11/10/22 1639  BP: (!) 98/53 96/62 (!) 98/56 (!) 102/57  Pulse: 69 66 70 65  Resp: 18 20 18 19   Temp: (!) 97.5 F (36.4 C) 98.3  F (36.8 C) (!) 97.1 F (36.2 C)   TempSrc: Oral  Temporal   SpO2: 100% 100% 97% 99%  Weight:      Height:        Intake/Output Summary (Last 24 hours) at 11/10/2022 1649 Last data filed at 11/10/2022 1624 Gross per 24 hour  Intake 877.17 ml  Output 1125 ml  Net -247.83 ml   Filed Weights   11/09/22 0817 11/10/22 0523  Weight: 73.5 kg 76.6 kg    Examination:  Physical Exam Constitutional:      General: He is not in acute distress.    Appearance: Normal appearance.  Cardiovascular:     Rate and Rhythm: Normal rate and regular rhythm.  Pulmonary:     Effort:  Pulmonary effort is normal.     Breath sounds: Normal breath sounds.  Abdominal:     General: Abdomen is flat. Bowel sounds are normal.     Palpations: Abdomen is soft.  Musculoskeletal:     Right lower leg: No edema.     Left lower leg: No edema.  Skin:    General: Skin is warm and dry.  Neurological:     General: No focal deficit present.     Mental Status: He is alert.  Psychiatric:        Mood and Affect: Mood normal.        Behavior: Behavior normal.          Scheduled Medications:   [MAR Hold] atorvastatin  20 mg Oral QPM   [MAR Hold] brimonidine  1 drop Both Eyes BID   And   [MAR Hold] timolol  1 drop Both Eyes BID   [MAR Hold] buPROPion  150 mg Oral q morning   [MAR Hold] carbidopa-levodopa  1.5 tablet Oral QID   [MAR Hold] cholecalciferol  1,000 Units Oral BID   [MAR Hold] finasteride  5 mg Oral Daily   [MAR Hold] insulin aspart  0-5 Units Subcutaneous QHS   [MAR Hold] insulin aspart  0-9 Units Subcutaneous TID WC   [MAR Hold] melatonin  10 mg Oral QHS   [MAR Hold] pantoprazole  40 mg Intravenous Q12H   [MAR Hold] rOPINIRole  0.5 mg Oral QID   [MAR Hold] sertraline  100 mg Oral Daily    Continuous Infusions:  sodium chloride Stopped (11/09/22 1635)   sodium chloride Stopped (11/10/22 1624)   pantoprazole 8 mg/hr (11/10/22 0654)    PRN Medications:  [MAR Hold] acetaminophen, [MAR Hold] albuterol, [MAR Hold] docusate sodium, [MAR Hold] hydrALAZINE, [MAR Hold] ipratropium, [MAR Hold] ondansetron (ZOFRAN) IV, [MAR Hold] mouth rinse  Antimicrobials from admission:  Anti-infectives (From admission, onward)    None           Data Reviewed:  I have personally reviewed the following...  CBC: Recent Labs  Lab 11/09/22 0823 11/09/22 1027 11/09/22 1647 11/09/22 2239 11/10/22 0340 11/10/22 0959 11/10/22 1432  WBC 14.7* 14.6* 13.3* 12.9* 14.8* 11.0*  --   NEUTROABS 12.9*  --   --   --   --   --   --   HGB 5.0* 5.2* 7.1* 6.4* 6.5* 6.3* 7.4*  HCT  15.7* 16.1* 21.3* 18.5* 19.9* 19.0* 22.3*  MCV 100.0 99.4 94.2 93.4 97.1 95.5  --   PLT 168 162 143* 131* 129* 135*  --    Basic Metabolic Panel: Recent Labs  Lab 11/09/22 0823 11/09/22 1027 11/10/22 0340  NA 137 140 139  K 4.6 4.7 3.1*  CL 105 110  110  CO2 16* 18* 23  GLUCOSE 422* 363* 112*  BUN 94* 95* 48*  CREATININE 1.54* 1.37* 0.91  CALCIUM 8.2* 8.2* 7.6*   GFR: Estimated Creatinine Clearance: 57.8 mL/min (by C-G formula based on SCr of 0.91 mg/dL). Liver Function Tests: Recent Labs  Lab 11/09/22 0823  AST 33  ALT 26  ALKPHOS 30*  BILITOT 0.5  PROT 4.9*  ALBUMIN 2.9*   Recent Labs  Lab 11/09/22 0823  LIPASE 40   No results for input(s): "AMMONIA" in the last 168 hours. Coagulation Profile: Recent Labs  Lab 11/09/22 0823  INR 1.4*   Cardiac Enzymes: No results for input(s): "CKTOTAL", "CKMB", "CKMBINDEX", "TROPONINI" in the last 168 hours. BNP (last 3 results) No results for input(s): "PROBNP" in the last 8760 hours. HbA1C: No results for input(s): "HGBA1C" in the last 72 hours. CBG: Recent Labs  Lab 11/09/22 1601 11/09/22 2051 11/10/22 0739 11/10/22 1102 11/10/22 1557  GLUCAP 216* 109* 96 106* 114*   Lipid Profile: No results for input(s): "CHOL", "HDL", "LDLCALC", "TRIG", "CHOLHDL", "LDLDIRECT" in the last 72 hours. Thyroid Function Tests: No results for input(s): "TSH", "T4TOTAL", "FREET4", "T3FREE", "THYROIDAB" in the last 72 hours. Anemia Panel: Recent Labs    11/09/22 0823 11/09/22 0824  VITAMINB12  --  258  FOLATE 10.3  --   FERRITIN 26  --   TIBC 274  --   IRON 108  --   RETICCTPCT 4.3*  --    Most Recent Urinalysis On File:     Component Value Date/Time   COLORURINE STRAW (A) 11/09/2022 0825   APPEARANCEUR CLEAR (A) 11/09/2022 0825   APPEARANCEUR Clear 12/03/2016 1115   LABSPEC 1.013 11/09/2022 0825   LABSPEC 1.020 04/22/2013 1032   PHURINE 5.0 11/09/2022 0825   GLUCOSEU >=500 (A) 11/09/2022 0825   GLUCOSEU Negative  04/22/2013 1032   HGBUR NEGATIVE 11/09/2022 0825   BILIRUBINUR NEGATIVE 11/09/2022 0825   BILIRUBINUR Negative 12/03/2016 1115   BILIRUBINUR Negative 04/22/2013 1032   KETONESUR NEGATIVE 11/09/2022 0825   PROTEINUR NEGATIVE 11/09/2022 0825   NITRITE NEGATIVE 11/09/2022 0825   LEUKOCYTESUR NEGATIVE 11/09/2022 0825   LEUKOCYTESUR Negative 04/22/2013 1032   Sepsis Labs: @LABRCNTIP (procalcitonin:4,lacticidven:4) Microbiology: No results found for this or any previous visit (from the past 240 hour(s)).    Radiology Studies last 3 days: No results found.           LOS: 1 day     Sunnie Nielsen, DO [facility] 11/10/2022, 4:49 PM    Dictation software may have been used to generate the above note. Typos may occur and escape review in typed/dictated notes. Please contact Dr Lyn Hollingshead directly for clarity if needed.  Staff may message me via secure chat in Epic  but this may not receive an immediate response,  please page me for urgent matters!  If 7PM-7AM, please contact night coverage [email or web address]

## 2022-11-10 NOTE — Anesthesia Procedure Notes (Signed)
Date/Time: 11/10/2022 4:21 PM  Performed by: Stormy Fabian, CRNAPre-anesthesia Checklist: Patient identified, Emergency Drugs available, Suction available and Patient being monitored Patient Re-evaluated:Patient Re-evaluated prior to induction Oxygen Delivery Method: Nasal cannula Induction Type: IV induction Dental Injury: Teeth and Oropharynx as per pre-operative assessment  Comments: Nasal cannula with etCO2 monitoring

## 2022-11-10 NOTE — Progress Notes (Signed)
Mobility Specialist - Progress Note    11/10/22 1049  Mobility  Activity Ambulated with assistance to bathroom;Stood at bedside;Dangled on edge of bed  Level of Assistance Contact guard assist, steadying assist  Assistive Device Front wheel walker  Distance Ambulated (ft) 10 ft  Range of Motion/Exercises Active  Activity Response Tolerated well  Mobility Referral Yes  $Mobility charge 1 Mobility  Mobility Specialist Start Time (ACUTE ONLY) 1036  Mobility Specialist Stop Time (ACUTE ONLY) 1051  Mobility Specialist Time Calculation (min) (ACUTE ONLY) 15 min   Author responding to bed alarm. Pt sitting EOB upone entry on RA. Pt STS MinA and ambulates to bathroom CGA with RW. Pt endorses no lightheadedness or dizziness during ambulation. Pt returned to bed and left with needs in reach; bed alarm activated. Pt given education on use of call bell to ask for assistance before trying to get up to bathroom.   Johnathan Hausen Mobility Specialist 11/10/22, 10:54 AM

## 2022-11-10 NOTE — Anesthesia Preprocedure Evaluation (Signed)
Anesthesia Evaluation  Patient identified by MRN, date of birth, ID band Patient awake    Reviewed: Allergy & Precautions, NPO status , Patient's Chart, lab work & pertinent test results  History of Anesthesia Complications Negative for: history of anesthetic complications  Airway Mallampati: III  TM Distance: <3 FB Neck ROM: full    Dental  (+) Chipped, Poor Dentition, Missing   Pulmonary neg shortness of breath, sleep apnea , former smoker   Pulmonary exam normal        Cardiovascular hypertension, (-) angina (-) Past MI Normal cardiovascular exam     Neuro/Psych negative neurological ROS  negative psych ROS   GI/Hepatic Neg liver ROS, PUD,GERD  Controlled,,  Endo/Other  negative endocrine ROSdiabetes, Type 2    Renal/GU Renal disease  negative genitourinary   Musculoskeletal   Abdominal   Peds  Hematology  (+) Blood dyscrasia, anemia   Anesthesia Other Findings Patient is NPO appropriate and reports no nausea or vomiting today.  Past Medical History: No date: Anemia No date: BPH (benign prostatic hypertrophy) No date: Diabetes type 2, controlled (HCC) No date: GERD (gastroesophageal reflux disease)     Comment:  RARE No date: Glaucoma     Comment:  Syndor No date: Hearing loss     Comment:  bilateral hearing aids occasionally No date: History of kidney stones No date: HLD (hyperlipidemia) No date: HTN (hypertension) No date: Macular degeneration     Comment:  Right ([employer]) No date: Osteoarthritis     Comment:  thumbs, hips, knees (s/p R TKR) 03/2013: PUD (peptic ulcer disease)     Comment:  2 antral gastric ulcers and 1 duodenal ulcer, NSAID               related, admitted to [facility] with melena/anemia s/p EGD No date: Sleep apnea     Comment:  CPAP No date: Tremor     Comment:  intention  Past Surgical History: 2012: BELPHAROPTOSIS REPAIR 12/2011: carotid US     Comment:  no significant  stenosis 2011, 2012: CATARACT EXTRACTION; Bilateral 1997: CHOLECYSTECTOMY 2009: COLONOSCOPY     Comment:  per patient, told no longer needed 11/01/2016: CYSTOSCOPY W/ URETERAL STENT PLACEMENT; Left     Comment:  Procedure: CYSTOSCOPY WITH STENT REPLACEMENT;  Surgeon:               [name], MD;  Location: [facility] ORS;  Service:               Urology;  Laterality: Left; 10/26/2016: CYSTOSCOPY WITH STENT PLACEMENT; Left     Comment:  Procedure: CYSTOSCOPY WITH STENT PLACEMENT;  Surgeon:               [name], MD;  Location: [facility] ORS;  Service:               Urology;  Laterality: Left; 04/2013: ESOPHAGOGASTRODUODENOSCOPY     Comment:  hospitalization @ [facility], gastritis, gastric ulcers and               duodenal ulcer with clean base (Rein) 11/01/2016: HOLEP-LASER ENUCLEATION OF THE PROSTATE WITH MORCELLATION;  N/A     Comment:  Procedure: HOLEP-LASER ENUCLEATION OF THE PROSTATE WITH               MORCELLATION;  Surgeon: [name], MD;  Location:               [facility] ORS;  Service: Urology;  Laterality: N/A; 83, 87, 90: KIDNEY STONE SURGERY  2006: TOTAL KNEE ARTHROPLASTY; Right 10/26/2016: URETEROSCOPY; Left     Comment:  Procedure: URETEROSCOPY;  Surgeon: [name], MD;               Location: [facility] ORS;  Service: Urology;  Laterality: Left; 11/01/2016: URETEROSCOPY WITH HOLMIUM LASER LITHOTRIPSY; Left     Comment:  Procedure: URETEROSCOPY WITH HOLMIUM LASER LITHOTRIPSY;               Surgeon: [name], MD;  Location: [facility] ORS;                Service: Urology;  Laterality: Left; 12/2011: US ECHOCARDIOGRAPHY     Comment:  nl LV fxn, EF 60%, nl valves, dilated aortic root (40mm)  BMI    Body Mass Index: 27.26 kg/m      Reproductive/Obstetrics negative OB ROS                             Anesthesia Physical Anesthesia Plan  ASA: 3  Anesthesia Plan: General   Post-op Pain Management:    Induction: Intravenous  PONV Risk Score and  Plan: Propofol infusion and TIVA  Airway Management Planned: Natural Airway and Nasal Cannula  Additional Equipment:   Intra-op Plan:   Post-operative Plan:   Informed Consent: I have reviewed the patients History and Physical, chart, labs and discussed the procedure including the risks, benefits and alternatives for the proposed anesthesia with the patient or authorized representative who has indicated his/her understanding and acceptance.     Dental Advisory Given  Plan Discussed with: Anesthesiologist, CRNA and Surgeon  Anesthesia Plan Comments: (Patient consented for risks of anesthesia including but not limited to:  - adverse reactions to medications - risk of airway placement if required - damage to eyes, teeth, lips or other oral mucosa - nerve damage due to positioning  - sore throat or hoarseness - Damage to heart, brain, nerves, lungs, other parts of body or loss of life  Patient voiced understanding.)       Anesthesia Quick Evaluation

## 2022-11-10 NOTE — Anesthesia Postprocedure Evaluation (Signed)
Anesthesia Post Note  Patient: Bradley [name]  Procedure(s) Performed: ESOPHAGOGASTRODUODENOSCOPY (EGD) WITH PROPOFOL  Patient location during evaluation: Endoscopy Anesthesia Type: General Level of consciousness: awake and alert Pain management: pain level controlled Vital Signs Assessment: post-procedure vital signs reviewed and stable Respiratory status: spontaneous breathing, nonlabored ventilation, respiratory function stable and patient connected to nasal cannula oxygen Cardiovascular status: blood pressure returned to baseline and stable Postop Assessment: no apparent nausea or vomiting Anesthetic complications: no   No notable events documented.   Last Vitals:  Vitals:   11/10/22 1639 11/10/22 1649  BP: (!) 102/57 99/68  Pulse: 65 63  Resp: 19 17  Temp:    SpO2: 99% 92%    Last Pain:  Vitals:   11/10/22 1649  TempSrc:   PainSc: 0-No pain                 Cleda Mccreedy Solomon Skowronek

## 2022-11-10 NOTE — Plan of Care (Signed)

## 2022-11-10 NOTE — Op Note (Signed)
[facility] Patient Name: Bradley [name] Procedure Date: 11/10/2022 3:28 PM MRN: [id number] Account #: [id number] Date of Birth: Jun 05, 1935 Admit Type: Inpatient Age: 87 Room: [facility] ENDO ROOM 2 Gender: Male Note Status: Finalized Instrument Name: Upper Endoscope [id number] Procedure:             Upper GI endoscopy Indications:           Hematemesis, Melena Providers:             Eather Colas MD, MD Referring MD:          Hassell Halim MD (Referring MD) Medicines:             Monitored Anesthesia Care Complications:         No immediate complications. Estimated blood loss:                         Minimal. Procedure:             Pre-Anesthesia Assessment:                        - Prior to the procedure, a History and Physical was                         performed, and patient medications and allergies were                         reviewed. The patient is competent. The risks and                         benefits of the procedure and the sedation options and                         risks were discussed with the patient. All questions                         were answered and informed consent was obtained.                         Patient identification and proposed procedure were                         verified by the physician, the nurse, the                         anesthesiologist, the anesthetist and the technician                         in the endoscopy suite. Mental Status Examination:                         alert and oriented. Airway Examination: normal                         oropharyngeal airway and neck mobility. Respiratory                         Examination: clear to auscultation. CV Examination:  normal. Prophylactic Antibiotics: The patient does not                         require prophylactic antibiotics. Prior                         Anticoagulants: The patient has taken no anticoagulant                          or antiplatelet agents except for aspirin. ASA Grade                         Assessment: III - A patient with severe systemic                         disease. After reviewing the risks and benefits, the                         patient was deemed in satisfactory condition to                         undergo the procedure. The anesthesia plan was to use                         monitored anesthesia care (MAC). Immediately prior to                         administration of medications, the patient was                         re-assessed for adequacy to receive sedatives. The                         heart rate, respiratory rate, oxygen saturations,                         blood pressure, adequacy of pulmonary ventilation, and                         response to care were monitored throughout the                         procedure. The physical status of the patient was                         re-assessed after the procedure.                        After obtaining informed consent, the endoscope was                         passed under direct vision. Throughout the procedure,                         the patient's blood pressure, pulse, and oxygen                         saturations were monitored continuously. The Endoscope  was introduced through the mouth, and advanced to the                         second part of duodenum. The upper GI endoscopy was                         accomplished without difficulty. The patient tolerated                         the procedure well. Findings:      The examined esophagus was normal.      Few non-bleeding superficial gastric ulcers with a clean ulcer base       (Forrest Class III) were found in the gastric antrum. The largest lesion       was 3 mm in largest dimension. Biopsies were taken with a cold forceps       for Helicobacter pylori testing. Estimated blood loss was minimal.      One non-bleeding superficial duodenal  ulcer with a clean ulcer base       (Forrest Class III) was found in the duodenal bulb. The lesion was 1 mm       in largest dimension. Estimated blood loss was minimal. Estimated blood       loss: none. Impression:            - Normal esophagus.                        - Non-bleeding gastric ulcers with a clean ulcer base                         (Forrest Class III). Biopsied.                        - Non-bleeding duodenal ulcer with a clean ulcer base                         (Forrest Class III). Recommendation:        - Return patient to hospital ward for ongoing care.                        - Resume regular diet.                        - Continue present medications.                        - Await pathology results.                        - Use Protonix (pantoprazole) 40 mg PO BID for 8 weeks                         and then daily.                        - No aspirin, ibuprofen, naproxen, or other                         non-steroidal anti-inflammatory drugs for 3 days. Procedure Code(s):     --- Professional ---  40981, Esophagogastroduodenoscopy, flexible,                         transoral; with biopsy, single or multiple Diagnosis Code(s):     --- Professional ---                        K25.9, Gastric ulcer, unspecified as acute or chronic,                         without hemorrhage or perforation                        K26.9, Duodenal ulcer, unspecified as acute or                         chronic, without hemorrhage or perforation                        K92.0, Hematemesis                        K92.1, Melena (includes Hematochezia) CPT copyright 2022 American Medical Association. All rights reserved. The codes documented in this report are preliminary and upon coder review may  be revised to meet current compliance requirements. Eather Colas MD, MD 11/10/2022 4:39:38 PM Number of Addenda: 0 Note Initiated On: 11/10/2022 3:28 PM Estimated Blood Loss:   Estimated blood loss was minimal.      [facility]

## 2022-11-10 NOTE — Hospital Course (Addendum)
Bradley [name] is a 87 y.o. male with medical history significant of peptic ulcer disease, HTN, HDL, DM, BPH, kidney stone, anemia, OSA on CPAP, who presents to ED 11/09/2022 with hematemesis, black and tarry stool.  06/18: hemoglobin 5.0 (13.0 on 07/31/2022) --> 2 unit PRBC --> 7.1, AKI with creatinine 1.54, mild DKA (with blood sugar 422, anion gap 16, bicarbonate 16). Per GI - plan EGD tomorrow  06/19: Hgb 6.5 --> 1 unit PRBC --> 7.4. EGD small ulcres no intervention needed, biopsied. Resume solid diet. Resume ASA 11/13/22. Will need PPI po bid x8 weeks then daily. Assure Hgb stable tomorrow prior to discharge.   Consultants:  GI  Procedures: 11/10/22 EGD - Dr Mia Creek       ASSESSMENT & PLAN:   Principal Problem:   UGIB (upper gastrointestinal bleed) Active Problems:   Acute blood loss anemia   DKA (diabetic ketoacidosis) (HCC)   Diabetes mellitus without complication (HCC)   HTN (hypertension)   HLD (hyperlipidemia)   Parkinson disease   AKI (acute kidney injury) (HCC)   Benign prostatic hyperplasia   Leukocytosis  UGIB (upper gastrointestinal bleed) and acute blood loss anemia  Small ulcers in stomach and duodenum on EGD Hgb 13 --> 5.0 S/p 3 unit PRBC Hold ASA x3 more days Another unit PRBC this morning  IV PPI gtt --> po PPI bid x8 weeks then daily  IV fluidd --> can resume solids  AM CBC if appropriate can d/c    DKA (diabetic ketoacidosis) (HCC) POA: resolved DM2 treated with IV fluid with 1.5 L normal saline, and 10 units of NovoLog, then repeated BMP, anion gap closed at 12. Will not need insulin gtt start glargine insulin 8 units daily - holding this morning while NPO SSI   HLD (hyperlipidemia) Lipitor   Parkinson disease Fall precaution Sinemet   AKI (acute kidney injury) (HCC): resolved  Likely due to dehydration and continuation of Diovan Hold Diovan BMP   Benign prostatic hyperplasia Proscar   Leukocytosis: improving  WBC 14.7, no fever,  no source of infection identified so far, likely reactive Follow-up CBC    Essential HTN: BP here has been low  Hold Diovan due to AKI and low BP IV hydralazine as needed.   Hypokalemia Likely d/t insulin admin  Replace as needed Monitor BMP   DVT prophylaxis: SCD Pertinent IV fluids/nutrition: NPO pend EGD, meds/sips ok, NS 75 mL/h  Central lines / invasive devices: none   Code Status: FULL CODE ACP documentation reviewed: none on file   Current Admission Status: inpatient   TOC needs / Dispo plan: TBD, PT/OT eval following EGD  Barriers to discharge / significant pending items: GI bleed, anemia, pendign EGD

## 2022-11-11 ENCOUNTER — Encounter: Payer: Self-pay | Admitting: Gastroenterology

## 2022-11-11 DIAGNOSIS — K922 Gastrointestinal hemorrhage, unspecified: Secondary | ICD-10-CM | POA: Diagnosis not present

## 2022-11-11 LAB — CBC
HCT: 22.2 % — ABNORMAL LOW (ref 39.0–52.0)
Hemoglobin: 7.2 g/dL — ABNORMAL LOW (ref 13.0–17.0)
MCH: 31.3 pg (ref 26.0–34.0)
MCHC: 32.4 g/dL (ref 30.0–36.0)
MCV: 96.5 fL (ref 80.0–100.0)
Platelets: 132 10*3/uL — ABNORMAL LOW (ref 150–400)
RBC: 2.3 MIL/uL — ABNORMAL LOW (ref 4.22–5.81)
RDW: 15.9 % — ABNORMAL HIGH (ref 11.5–15.5)
WBC: 9 10*3/uL (ref 4.0–10.5)
nRBC: 0.2 % (ref 0.0–0.2)

## 2022-11-11 LAB — BASIC METABOLIC PANEL
Anion gap: 4 — ABNORMAL LOW (ref 5–15)
BUN: 27 mg/dL — ABNORMAL HIGH (ref 8–23)
CO2: 26 mmol/L (ref 22–32)
Calcium: 7.9 mg/dL — ABNORMAL LOW (ref 8.9–10.3)
Chloride: 112 mmol/L — ABNORMAL HIGH (ref 98–111)
Creatinine, Ser: 0.84 mg/dL (ref 0.61–1.24)
GFR, Estimated: >60 mL/min (ref >60)
Glucose, Bld: 148 mg/dL — ABNORMAL HIGH (ref 70–99)
Potassium: 3.9 mmol/L (ref 3.5–5.1)
Sodium: 142 mmol/L (ref 135–145)

## 2022-11-11 LAB — HEMOGLOBIN AND HEMATOCRIT, BLOOD
HCT: 21.5 % — ABNORMAL LOW (ref 39.0–52.0)
HCT: 26.6 % — ABNORMAL LOW (ref 39.0–52.0)
Hemoglobin: 7 g/dL — ABNORMAL LOW (ref 13.0–17.0)
Hemoglobin: 8.7 g/dL — ABNORMAL LOW (ref 13.0–17.0)

## 2022-11-11 LAB — BPAM RBC
Blood Product Expiration Date: 202407162359
ISSUE DATE / TIME: 202406200955
Unit Type and Rh: 6200
Unit Type and Rh: 6200
Unit Type and Rh: 6200

## 2022-11-11 LAB — TYPE AND SCREEN

## 2022-11-11 LAB — GLUCOSE, CAPILLARY
Glucose-Capillary: 120 mg/dL — ABNORMAL HIGH (ref 70–99)
Glucose-Capillary: 133 mg/dL — ABNORMAL HIGH (ref 70–99)
Glucose-Capillary: 190 mg/dL — ABNORMAL HIGH (ref 70–99)
Glucose-Capillary: 173 mg/dL — ABNORMAL HIGH (ref 70–99)
Glucose-Capillary: 141 mg/dL — ABNORMAL HIGH (ref 70–99)

## 2022-11-11 LAB — PREPARE RBC (CROSSMATCH)

## 2022-11-11 MED ORDER — PANTOPRAZOLE SODIUM 40 MG PO TBEC: 40.0000 mg | DELAYED_RELEASE_TABLET | Freq: Two times a day (BID) | ORAL | 0 refills | Status: AC

## 2022-11-11 MED ORDER — ASPIRIN 81 MG PO TBEC: 81.0000 mg | DELAYED_RELEASE_TABLET | Freq: Every day | ORAL | 12 refills | Status: AC

## 2022-11-11 MED ORDER — HALOPERIDOL LACTATE 5 MG/ML IJ SOLN
1.0000 mg | Freq: Once | INTRAMUSCULAR | Status: AC
  Administered 2022-11-11: 1 mg via INTRAVENOUS
  Filled 2022-11-11: qty 1

## 2022-11-11 MED ORDER — POLYETHYLENE GLYCOL 3350 17 G PO PACK
17.0000 g | PACK | Freq: Every day | ORAL | Status: DC
  Administered 2022-11-11: 17 g via ORAL
  Filled 2022-11-11 (×2): qty 1

## 2022-11-11 MED ORDER — PANTOPRAZOLE SODIUM 40 MG PO TBEC: 40.0000 mg | DELAYED_RELEASE_TABLET | Freq: Every day | ORAL | 0 refills | Status: DC

## 2022-11-11 MED ORDER — SODIUM CHLORIDE 0.9% IV SOLUTION: Freq: Once | INTRAVENOUS | Status: AC

## 2022-11-11 NOTE — Progress Notes (Signed)
Transition of Care [facility]) - Inpatient Brief Assessment   Patient Details  Name: Asah Ramsier MRN: [id number] Date of Birth: Jun 11, 1935  [facility] CM/SW Contact:    Truddie Hidden, RN Phone Number: 11/11/2022, 11:05 AM   Clinical Narrative: TOC assessing for ongoing needs and discharge planning.   Transition of Care Asessment: Insurance and Status: Insurance coverage has been reviewed Patient has primary care physician: Yes Home environment has been reviewed: Return home with his wife Prior level of function:: Independent Prior/Current Home Services: No current home services Social Determinants of Health Reivew: SDOH reviewed no interventions necessary Readmission risk has been reviewed: Yes Transition of care needs: no transition of care needs at this time

## 2022-11-11 NOTE — Plan of Care (Signed)

## 2022-11-11 NOTE — Plan of Care (Signed)
Patient is participating in goals of care to meet goals for discharge.  Terrilyn Saver, RN   Problem: Education: Goal: Knowledge of General Education information will improve Description: Including pain rating scale, medication(s)/side effects and non-pharmacologic comfort measures Outcome: Progressing   Problem: Health Behavior/Discharge Planning: Goal: Ability to manage health-related needs will improve Outcome: Progressing   Problem: Clinical Measurements: Goal: Ability to maintain clinical measurements within normal limits will improve Outcome: Progressing Goal: Will remain free from infection Outcome: Progressing Goal: Diagnostic test results will improve Outcome: Progressing Goal: Respiratory complications will improve Outcome: Progressing Goal: Cardiovascular complication will be avoided Outcome: Progressing   Problem: Activity: Goal: Risk for activity intolerance will decrease Outcome: Progressing   Problem: Nutrition: Goal: Adequate nutrition will be maintained Outcome: Progressing   Problem: Coping: Goal: Level of anxiety will decrease Outcome: Progressing   Problem: Elimination: Goal: Will not experience complications related to bowel motility Outcome: Progressing Goal: Will not experience complications related to urinary retention Outcome: Progressing   Problem: Pain Managment: Goal: General experience of comfort will improve Outcome: Progressing   Problem: Safety: Goal: Ability to remain free from injury will improve Outcome: Progressing   Problem: Skin Integrity: Goal: Risk for impaired skin integrity will decrease Outcome: Progressing   Problem: Education: Goal: Ability to describe self-care measures that may prevent or decrease complications (Diabetes Survival Skills Education) will improve Outcome: Progressing Goal: Individualized Educational Video(s) Outcome: Progressing   Problem: Coping: Goal: Ability to adjust to condition or  change in health will improve Outcome: Progressing   Problem: Fluid Volume: Goal: Ability to maintain a balanced intake and output will improve Outcome: Progressing   Problem: Health Behavior/Discharge Planning: Goal: Ability to identify and utilize available resources and services will improve Outcome: Progressing Goal: Ability to manage health-related needs will improve Outcome: Progressing   Problem: Metabolic: Goal: Ability to maintain appropriate glucose levels will improve Outcome: Progressing   Problem: Nutritional: Goal: Maintenance of adequate nutrition will improve Outcome: Progressing Goal: Progress toward achieving an optimal weight will improve Outcome: Progressing   Problem: Skin Integrity: Goal: Risk for impaired skin integrity will decrease Outcome: Progressing   Problem: Tissue Perfusion: Goal: Adequacy of tissue perfusion will improve Outcome: Progressing

## 2022-11-11 NOTE — Progress Notes (Signed)
PROGRESS NOTE  Bradley [name] WUJ:[id number] DOB: [birth year] DOA: 11/09/2022 PCP: Kandyce Rud, MD  HPI/Recap of past 24 hours: Bradley [name] is a 87 y.o. male with medical history significant of peptic ulcer disease, HTN, HDL, DM, BPH, kidney stone, anemia, OSA on CPAP, who presents to ED 11/09/2022 with hematemesis, black and tarry stool.  06/18: hemoglobin 5.0 (13.0 on 07/31/2022) --> 2 unit PRBC --> 7.1, AKI with creatinine 1.54, mild DKA (with blood sugar 422, anion gap 16, bicarbonate 16). Per GI - plan EGD tomorrow  06/19: Hgb 6.5 --> 1 unit PRBC --> 7.4. EGD revealed nonbleeding gastric ulcers and nonbleeding duodenal ulcer with clean bases.  Resume aspirin on 11/14/2022.  Will need PPI for 6 weeks then PPI daily indefinitely.  11/11/2022: The patient was seen and examined at his bedside.  He had no complaint denies any abdominal pain nausea or stools today.  Returned to bedside after patient's wife requested to provide her with updates.  Patient repeated hemoglobin 7.0 post 1 unit PRBC transfusion on 11/11/2022.  Repeating H&H.  Assessment/Plan: Principal Problem:   UGIB (upper gastrointestinal bleed) Active Problems:   Acute blood loss anemia   DKA (diabetic ketoacidosis) (HCC)   Diabetes mellitus without complication (HCC)   HTN (hypertension)   HLD (hyperlipidemia)   Parkinson disease   AKI (acute kidney injury) (HCC)   Benign prostatic hyperplasia   Leukocytosis  UGIB (upper gastrointestinal bleed) and acute blood loss anemia  Nonbleeding gastric ulcers and duodenal ulcer seen on EGD Hgb 13 --> 5.0 S/p 4 unit PRBC as of 11/11/2022. IV PPI gtt --> po PPI bid x8 weeks then daily  Hold ASA x3 more days Hemoglobin 7.0 post 1 unit PRBCs on 11/11/22 No reported overt bleeding on 11/11/2022. Repeat H&H.  Acute blood loss anemia in the setting of upper GI bleed Transfuse hemoglobin less than 8.0 Monitor H&H   DKA (diabetic ketoacidosis) (HCC) POA:  resolved DM2 Resolved Continue insulin sliding scale Resume home metformin at discharge.   HLD (hyperlipidemia) Lipitor   Parkinson disease Continue fall precaution Sinemet   AKI (acute kidney injury) [name]): resolved  Resolved Avoid nephrotoxic agents and dehydration.   Benign prostatic hyperplasia Proscar   Resolved leukocytosis   Essential HTN: BP here has been low  Hold Diovan due to AKI and low BP IV hydralazine as needed. Continue to monitor vital signs.   Resolved hypokalemia    DVT prophylaxis: SCD  Central lines / invasive devices: none    Code Status: FULL CODE ACP documentation reviewed: none on file    Current Admission Status: inpatient   TOC needs / Dispo plan: TBD, PT/OT Barriers to discharge / significant pending items: GI bleed, acute blood loss anemia      Status is: Inpatient The patient requires at least 2 midnights for further evaluation and treatment of present condition.    Objective: Vitals:   11/11/22 1026 11/11/22 1133 11/11/22 1134 11/11/22 1243  BP: (!) 98/57  106/64 108/70  Pulse: 69  64 66  Resp:   20   Temp: 98.4 F (36.9 C) 98.4 F (36.9 C) 98.4 F (36.9 C) 98.3 F (36.8 C)  TempSrc: Axillary Axillary Axillary Axillary  SpO2: 96%  98% 98%  Weight:      Height:        Intake/Output Summary (Last 24 hours) at 11/11/2022 1525 Last data filed at 11/11/2022 1245 Gross per 24 hour  Intake 981.79 ml  Output 100 ml  Net 881.79 ml  Filed Weights   11/09/22 0817 11/10/22 0523 11/11/22 0314  Weight: 73.5 kg 76.6 kg 76.6 kg    Exam:  General: 87 y.o. year-old male well developed well nourished in no acute distress.  Alert and oriented x3. Cardiovascular: Regular rate and rhythm with no rubs or gallops.  No thyromegaly or JVD noted.   Respiratory: Clear to auscultation with no wheezes or rales. Good inspiratory effort. Abdomen: Soft nontender nondistended with normal bowel sounds x4 quadrants. Musculoskeletal: No  lower extremity edema. 2/4 pulses in all 4 extremities. Skin: No ulcerative lesions noted or rashes, Psychiatry: Mood is appropriate for condition and setting   Data Reviewed: CBC: Recent Labs  Lab 11/09/22 0823 11/09/22 1027 11/09/22 1647 11/09/22 2239 11/10/22 0340 11/10/22 0959 11/10/22 1432 11/11/22 0440 11/11/22 1331  WBC 14.7*   < > 13.3* 12.9* 14.8* 11.0*  --  9.0  --   NEUTROABS 12.9*  --   --   --   --   --   --   --   --   HGB 5.0*   < > 7.1* 6.4* 6.5* 6.3* 7.4* 7.2* 7.0*  HCT 15.7*   < > 21.3* 18.5* 19.9* 19.0* 22.3* 22.2* 21.5*  MCV 100.0   < > 94.2 93.4 97.1 95.5  --  96.5  --   PLT 168   < > 143* 131* 129* 135*  --  132*  --    < > = values in this interval not displayed.   Basic Metabolic Panel: Recent Labs  Lab 11/09/22 0823 11/09/22 1027 11/10/22 0340 11/11/22 0440  NA 137 140 139 142  K 4.6 4.7 3.1* 3.9  CL 105 110 110 112*  CO2 16* 18* 23 26  GLUCOSE 422* 363* 112* 148*  BUN 94* 95* 48* 27*  CREATININE 1.54* 1.37* 0.91 0.84  CALCIUM 8.2* 8.2* 7.6* 7.9*   GFR: Estimated Creatinine Clearance: 62.7 mL/min (by C-G formula based on SCr of 0.84 mg/dL). Liver Function Tests: Recent Labs  Lab 11/09/22 0823  AST 33  ALT 26  ALKPHOS 30*  BILITOT 0.5  PROT 4.9*  ALBUMIN 2.9*   Recent Labs  Lab 11/09/22 0823  LIPASE 40   No results for input(s): "AMMONIA" in the last 168 hours. Coagulation Profile: Recent Labs  Lab 11/09/22 0823  INR 1.4*   Cardiac Enzymes: No results for input(s): "CKTOTAL", "CKMB", "CKMBINDEX", "TROPONINI" in the last 168 hours. BNP (last 3 results) No results for input(s): "PROBNP" in the last 8760 hours. HbA1C: No results for input(s): "HGBA1C" in the last 72 hours. CBG: Recent Labs  Lab 11/10/22 2024 11/10/22 2058 11/11/22 0250 11/11/22 0822 11/11/22 1140  GLUCAP 171* 165* 120* 133* 190*   Lipid Profile: No results for input(s): "CHOL", "HDL", "LDLCALC", "TRIG", "CHOLHDL", "LDLDIRECT" in the last 72  hours. Thyroid Function Tests: No results for input(s): "TSH", "T4TOTAL", "FREET4", "T3FREE", "THYROIDAB" in the last 72 hours. Anemia Panel: Recent Labs    11/09/22 0823 11/09/22 0824  VITAMINB12  --  258  FOLATE 10.3  --   FERRITIN 26  --   TIBC 274  --   IRON 108  --   RETICCTPCT 4.3*  --    Urine analysis:    Component Value Date/Time   COLORURINE STRAW (A) 11/09/2022 0825   APPEARANCEUR CLEAR (A) 11/09/2022 0825   APPEARANCEUR Clear 12/03/2016 1115   LABSPEC 1.013 11/09/2022 0825   LABSPEC 1.020 04/22/2013 1032   PHURINE 5.0 11/09/2022 0825   GLUCOSEU >=500 (A) 11/09/2022  0825   GLUCOSEU Negative 04/22/2013 1032   HGBUR NEGATIVE 11/09/2022 0825   BILIRUBINUR NEGATIVE 11/09/2022 0825   BILIRUBINUR Negative 12/03/2016 1115   BILIRUBINUR Negative 04/22/2013 1032   KETONESUR NEGATIVE 11/09/2022 0825   PROTEINUR NEGATIVE 11/09/2022 0825   NITRITE NEGATIVE 11/09/2022 0825   LEUKOCYTESUR NEGATIVE 11/09/2022 0825   LEUKOCYTESUR Negative 04/22/2013 1032   Sepsis Labs: @LABRCNTIP (procalcitonin:4,lacticidven:4)  )No results found for this or any previous visit (from the past 240 hour(s)).    Studies: No results found.  Scheduled Meds:  atorvastatin  20 mg Oral QPM   brimonidine  1 drop Both Eyes BID   And   timolol  1 drop Both Eyes BID   buPROPion  150 mg Oral q morning   carbidopa-levodopa  1.5 tablet Oral QID   cholecalciferol  1,000 Units Oral BID   finasteride  5 mg Oral Daily   insulin aspart  0-5 Units Subcutaneous QHS   insulin aspart  0-9 Units Subcutaneous TID WC   melatonin  10 mg Oral QHS   pantoprazole  40 mg Oral BID   rOPINIRole  0.5 mg Oral QID   sertraline  100 mg Oral Daily    Continuous Infusions:   LOS: 2 days     Darlin Drop, MD [facility] Pager [phone]  If 7PM-7AM, please contact night-coverage [email or web address] Password [facility] 11/11/2022, 3:25 PM

## 2022-11-11 NOTE — Progress Notes (Signed)
GI Inpatient Follow-up Note  Subjective:  Patient seen and doing well. No bowel movements. Hemoglobin variable which is not unexpected and will take some time to equilibrate.  Scheduled Inpatient Medications:   atorvastatin  20 mg Oral QPM   brimonidine  1 drop Both Eyes BID   And   timolol  1 drop Both Eyes BID   buPROPion  150 mg Oral q morning   carbidopa-levodopa  1.5 tablet Oral QID   cholecalciferol  1,000 Units Oral BID   finasteride  5 mg Oral Daily   insulin aspart  0-5 Units Subcutaneous QHS   insulin aspart  0-9 Units Subcutaneous TID WC   melatonin  10 mg Oral QHS   pantoprazole  40 mg Oral BID   rOPINIRole  0.5 mg Oral QID   sertraline  100 mg Oral Daily    Continuous Inpatient Infusions:    PRN Inpatient Medications:  acetaminophen, albuterol, docusate sodium, hydrALAZINE, ipratropium, ondansetron (ZOFRAN) IV, mouth rinse  Review of Systems:  Review of Systems  Constitutional:  Negative for chills and fever.  Respiratory:  Negative for shortness of breath.   Cardiovascular:  Negative for chest pain.  Gastrointestinal:  Negative for blood in stool, constipation and melena.  Musculoskeletal:  Negative for joint pain.  Skin:  Negative for rash.  Neurological:  Negative for focal weakness.  Psychiatric/Behavioral:  Negative for substance abuse.   All other systems reviewed and are negative.    Physical Examination: BP 102/88 (BP Location: Right Arm)   Pulse 69   Temp 98.3 F (36.8 C) (Axillary)   Resp 20   Ht 5\' 6"  (1.676 m)   Wt 76.6 kg   SpO2 100%   BMI 27.26 kg/m  Gen: NAD, alert and oriented x 4 HEENT: PEERLA, EOMI, Neck: supple, no JVD or thyromegaly Chest: No respiratory distress Abd: soft, non-tender, non-distended Ext: no edema, well perfused with 2+ pulses, Skin: no rash or lesions noted Lymph: no LAD  Data: Lab Results  Component Value Date   WBC 9.0 11/11/2022   HGB 8.7 (L) 11/11/2022   HCT 26.6 (L) 11/11/2022   MCV 96.5  11/11/2022   PLT 132 (L) 11/11/2022   Recent Labs  Lab 11/11/22 0440 11/11/22 1331 11/11/22 1521  HGB 7.2* 7.0* 8.7*   Lab Results  Component Value Date   NA 142 11/11/2022   K 3.9 11/11/2022   CL 112 (H) 11/11/2022   CO2 26 11/11/2022   BUN 27 (H) 11/11/2022   CREATININE 0.84 11/11/2022   Lab Results  Component Value Date   ALT 26 11/09/2022   AST 33 11/09/2022   ALKPHOS 30 (L) 11/09/2022   BILITOT 0.5 11/09/2022   Recent Labs  Lab 11/09/22 0823  APTT 22*  INR 1.4*   Assessment/Plan: Mr. Rachal is a 87 y.o. gentleman with gastric/duodenal ulcers with no high risk stigmata  Recommendations:  - continue PPI PO BID - continue diet - continue to monitor hemoglobins - will start stool softener - f/u path results - ok to resume aspirin in 2-3 days if clear clinical indication - transfuse for hemoglobin if < 7 only - monitor for overt GI bleeding  Merlyn Lot MD, MPH [facility] GI

## 2022-11-11 NOTE — Progress Notes (Signed)
       CROSS COVER NOTE  NAME: Joshau Kishbaugh MRN: [id number] DOB : 1935-09-18    Concern as stated by nurse / staff    Patient severely agitated combative and trying to leave    Pertinent findings on chart review: Admitted with upper GI bleed and DKA (now resolved ). Post EGD in the afternoon now with increased confusion. Has Parkinson;s and some confusion at baseline   Assessment and  Interventions   Assessment: VSS EKG SR Qtc 489  Plan: Haldol 1 mg I V x one dose

## 2022-11-12 DIAGNOSIS — K922 Gastrointestinal hemorrhage, unspecified: Secondary | ICD-10-CM | POA: Diagnosis not present

## 2022-11-12 LAB — TYPE AND SCREEN
ABO/RH(D): A POS
Unit division: 0
Unit division: 0

## 2022-11-12 LAB — GLUCOSE, CAPILLARY: Glucose-Capillary: 136 mg/dL — ABNORMAL HIGH (ref 70–99)

## 2022-11-12 LAB — BPAM RBC
Blood Product Expiration Date: 202406252359
ISSUE DATE / TIME: 202406181327
Unit Type and Rh: 6200

## 2022-11-12 LAB — SURGICAL PATHOLOGY

## 2022-11-12 NOTE — Progress Notes (Signed)
Discussed discharge instructions with patient and wife, including medications and follow up appointments.    Discussed follow up with PCP to recheck hemoglobin and to monitor BP closely at home since we stopped a blood medication.   Sent home the AVS with patient.   IVs removed and pt/family did not have any further questions

## 2022-11-12 NOTE — Care Management Important Message (Signed)
Important Message  Patient Details  Name: Bradley [name] MRN: [id number] Date of Birth: Jun 04, 1935   Medicare Important Message Given:  Yes     [name] 11/12/2022, 10:12 AM

## 2022-11-12 NOTE — Discharge Summary (Signed)
Discharge Summary  Bradley [name] FAO:[id number] DOB: June 21, 1935  PCP: Kandyce Rud, MD  Admit date: 11/09/2022 Discharge date: 11/12/2022  Time spent: 35 minutes   Recommendations for Outpatient Follow-up:  Follow up with your PCP in 1-2 week. Repeat CBC in 1 week.   Discharge Diagnoses:  Active Hospital Problems   Diagnosis Date Noted   UGIB (upper gastrointestinal bleed) 11/09/2022    Priority: High   Acute blood loss anemia 11/09/2022    Priority: Low   DKA (diabetic ketoacidosis) (HCC) 11/09/2022    Priority: 2.   Diabetes mellitus without complication (HCC) 11/09/2022    Priority: 2.   HLD (hyperlipidemia)     Priority: 3.   HTN (hypertension)     Priority: 3.   Parkinson disease 11/09/2022    Priority: 7.   AKI (acute kidney injury) (HCC) 11/09/2022    Priority: 8.   Benign prostatic hyperplasia     Priority: 9.   Leukocytosis 11/09/2022    Priority: 10.    Resolved Hospital Problems  No resolved problems to display.    Discharge Condition: Stable   Diet recommendation: Resume previous diet   Vitals:   11/12/22 0329 11/12/22 0745  BP: 136/73 (!) 125/93  Pulse: 70 69  Resp: 20 16  Temp: 98.5 F (36.9 C) (!) 97.5 F (36.4 C)  SpO2: 96% 98%    History of present illness:   Bradley [name] is a 87 y.o. male with medical history significant of peptic ulcer disease, HTN, HDL, DM, BPH, kidney stone, anemia, OSA on CPAP, who presents to ED 11/09/2022 with hematemesis, black and tarry stool.  06/18: hemoglobin 5.0 (13.0 on 07/31/2022) --> 2 unit PRBC --> 7.1, AKI with creatinine 1.54, mild DKA (with blood sugar 422, anion gap 16, bicarbonate 16). Per GI - plan EGD 06/19: Hgb 6.5 --> 1 unit PRBC --> 7.4. EGD revealed nonbleeding gastric ulcers and nonbleeding duodenal ulcer with clean bases.  Resume aspirin on 11/14/2022.  Will need PPI for 6 weeks then PPI daily indefinitely.   11/12/2022:  Seen at bedside.  No new complaints.  Hg 8.7 from 7.0 post 1  unit transfusion.    Hospital Course:  Principal Problem:   UGIB (upper gastrointestinal bleed) Active Problems:   Acute blood loss anemia   DKA (diabetic ketoacidosis) (HCC)   Diabetes mellitus without complication (HCC)   HTN (hypertension)   HLD (hyperlipidemia)   Parkinson disease   AKI (acute kidney injury) (HCC)   Benign prostatic hyperplasia   Leukocytosis  UGIB (upper gastrointestinal bleed) and acute blood loss anemia  Nonbleeding gastric ulcers and duodenal ulcer seen on EGD Hgb 13 --> 5.0 S/p 4 unit PRBC as of 11/11/2022. IV PPI gtt --> po PPI bid x8 weeks then daily  Hold ASA x3 days Hemoglobin 7.0 post 1 unit PRBCs on 11/11/22 Hg 8.7 from 7.0   Acute blood loss anemia in the setting of upper GI bleed Transfuse hemoglobin less than 8.0 Monitor H&H   DKA (diabetic ketoacidosis) (HCC) POA: resolved DM2 Resolved Continue insulin sliding scale Resume home metformin at discharge.   HLD (hyperlipidemia) Lipitor   Parkinson disease Continue fall precaution Sinemet   AKI (acute kidney injury) [facility]): resolved  Resolved Avoid nephrotoxic agents and dehydration.   Benign prostatic hyperplasia Proscar   Resolved leukocytosis   Essential HTN: BP here has been low  Hold Diovan due to AKI and low BP IV hydralazine as needed. Continue to monitor vital signs.   Resolved hypokalemia  Discharge Exam: BP (!) 125/93 (BP Location: Right Arm)   Pulse 69   Temp (!) 97.5 F (36.4 C)   Resp 16   Ht 5\' 6"  (1.676 m)   Wt 76.6 kg   SpO2 98%   BMI 27.26 kg/m  General: 87 y.o. year-old male well developed well nourished in no acute distress.  Alert and oriented x3. Cardiovascular: Regular rate and rhythm with no rubs or gallops.  No thyromegaly or JVD noted.   Respiratory: Clear to auscultation with no wheezes or rales. Good inspiratory effort. Abdomen: Soft nontender nondistended with normal bowel sounds x4 quadrants. Musculoskeletal: No lower extremity  edema. 2/4 pulses in all 4 extremities. Skin: No ulcerative lesions noted or rashes, Psychiatry: Mood is appropriate for condition and setting  Discharge Instructions You were cared for by a hospitalist during your hospital stay. If you have any questions about your discharge medications or the care you received while you were in the hospital after you are discharged, you can call the unit and asked to speak with the hospitalist on call if the hospitalist that took care of you is not available. Once you are discharged, your primary care physician will handle any further medical issues. Please note that NO REFILLS for any discharge medications will be authorized once you are discharged, as it is imperative that you return to your primary care physician (or establish a relationship with a primary care physician if you do not have one) for your aftercare needs so that they can reassess your need for medications and monitor your lab values.   Allergies as of 11/12/2022       Reactions   Sulfa Antibiotics    As infant   Penicillins Rash        Medication List     STOP taking these medications    valsartan 80 MG tablet Commonly known as: DIOVAN       TAKE these medications    acetaminophen 500 MG tablet Commonly known as: TYLENOL Take 1,000 mg by mouth daily as needed for headache, fever, moderate pain or mild pain.   albuterol 108 (90 Base) MCG/ACT inhaler Commonly known as: VENTOLIN HFA Inhale 1-2 puffs into the lungs every 4 (four) hours as needed for shortness of breath or wheezing.   aspirin EC 81 MG tablet Commonly known as: Aspirin 81 Take 1 tablet (81 mg total) by mouth daily. Start taking on: November 14, 2022 What changed: These instructions start on November 14, 2022. If you are unsure what to do until then, ask your doctor or other care provider.   atorvastatin 20 MG tablet Commonly known as: LIPITOR Take 20 mg by mouth every evening.   brimonidine-timolol 0.2-0.5 %  ophthalmic solution Commonly known as: COMBIGAN Place 1 drop into both eyes 2 (two) times daily.   buPROPion 150 MG 24 hr tablet Commonly known as: WELLBUTRIN XL Take 150 mg by mouth every morning.   carbidopa-levodopa 25-100 MG tablet Commonly known as: SINEMET IR Take 1.5 tablets by mouth 4 (four) times daily. Extended release Taken at 0730, 1130, 1500 and 1830   docusate sodium 100 MG capsule Commonly known as: COLACE Take 100 mg by mouth 2 (two) times daily.   finasteride 5 MG tablet Commonly known as: PROSCAR Take 5 mg by mouth every morning.   HAIR/SKIN/NAILS PO Take 1,666 mg by mouth 2 (two) times daily.   ipratropium 0.06 % nasal spray Commonly known as: ATROVENT Place 1 spray into both nostrils 2 (two)  times daily as needed for rhinitis.   Melatonin 10 MG Tabs Take 10 mg by mouth at bedtime.   metFORMIN 500 MG 24 hr tablet Commonly known as: GLUCOPHAGE-XR Take 500 mg by mouth 2 (two) times daily.   pantoprazole 40 MG tablet Commonly known as: PROTONIX Take 1 tablet (40 mg total) by mouth 2 (two) times daily.   pantoprazole 40 MG tablet Commonly known as: Protonix Take 1 tablet (40 mg total) by mouth daily. Start taking on: January 11, 2023   polyethylene glycol powder 17 GM/SCOOP powder Commonly known as: GLYCOLAX/MIRALAX Take 17 g by mouth daily.   PRESERVISION AREDS 2 PO Take 1 tablet by mouth 2 (two) times daily.   rOPINIRole 0.5 MG tablet Commonly known as: REQUIP Take 0.5 mg by mouth QID. 8 am, 1130, 1500, 1830   sertraline 100 MG tablet Commonly known as: ZOLOFT Take 100 mg by mouth daily.   Vitamin D3 1000 units Caps Take 1,000 Units by mouth 2 (two) times daily.       Allergies  Allergen Reactions   Sulfa Antibiotics     As infant   Penicillins Rash    Follow-up Information     Kandyce Rud, MD. Call today.   Specialty: Family Medicine Why: Please call for a posthospital follow-up appointment. Contact information: [address] [facility] [place] [address] [phone]                  The results of significant diagnostics from this hospitalization (including imaging, microbiology, ancillary and laboratory) are listed below for reference.    Significant Diagnostic Studies: No results found.  Microbiology: No results found for this or any previous visit (from the past 240 hour(s)).   Labs: Basic Metabolic Panel: Recent Labs  Lab 11/09/22 0823 11/09/22 1027 11/10/22 0340 11/11/22 0440  NA 137 140 139 142  K 4.6 4.7 3.1* 3.9  CL 105 110 110 112*  CO2 16* 18* 23 26  GLUCOSE 422* 363* 112* 148*  BUN 94* 95* 48* 27*  CREATININE 1.54* 1.37* 0.91 0.84  CALCIUM 8.2* 8.2* 7.6* 7.9*   Liver Function Tests: Recent Labs  Lab 11/09/22 0823  AST 33  ALT 26  ALKPHOS 30*  BILITOT 0.5  PROT 4.9*  ALBUMIN 2.9*   Recent Labs  Lab 11/09/22 0823  LIPASE 40   No results for input(s): "AMMONIA" in the last 168 hours. CBC: Recent Labs  Lab 11/09/22 0823 11/09/22 1027 11/09/22 1647 11/09/22 2239 11/10/22 0340 11/10/22 0959 11/10/22 1432 11/11/22 0440 11/11/22 1331 11/11/22 1521  WBC 14.7*   < > 13.3* 12.9* 14.8* 11.0*  --  9.0  --   --   NEUTROABS 12.9*  --   --   --   --   --   --   --   --   --   HGB 5.0*   < > 7.1* 6.4* 6.5* 6.3* 7.4* 7.2* 7.0* 8.7*  HCT 15.7*   < > 21.3* 18.5* 19.9* 19.0* 22.3* 22.2* 21.5* 26.6*  MCV 100.0   < > 94.2 93.4 97.1 95.5  --  96.5  --   --   PLT 168   < > 143* 131* 129* 135*  --  132*  --   --    < > = values in this interval not displayed.   Cardiac Enzymes: No results for input(s): "CKTOTAL", "CKMB", "CKMBINDEX", "TROPONINI" in the last 168 hours. BNP: BNP (  last 3 results) No results for input(s): "BNP" in the last 8760 hours.  ProBNP (last 3 results) No results for input(s): "PROBNP" in the last 8760 hours.  CBG: Recent Labs  Lab 11/11/22 0822 11/11/22 1140 11/11/22 1653 11/11/22 2124  11/12/22 0751  GLUCAP 133* 190* 173* 141* 136*       Signed:  Darlin Drop, MD [facility] 11/12/2022, 5:46 PM

## 2023-02-16 ENCOUNTER — Other Ambulatory Visit: Payer: Self-pay | Admitting: Pulmonary Disease

## 2023-02-16 DIAGNOSIS — R911 Solitary pulmonary nodule: Secondary | ICD-10-CM

## 2023-02-16 DIAGNOSIS — K439 Ventral hernia without obstruction or gangrene: Secondary | ICD-10-CM

## 2023-03-15 ENCOUNTER — Ambulatory Visit
Admission: RE | Admit: 2023-03-15 | Discharge: 2023-03-15 | Disposition: A | Discharge status: Home / Self Care | Payer: Medicare Other | Source: Ambulatory Visit | Attending: Pulmonary Disease | Admitting: Pulmonary Disease

## 2023-03-15 DIAGNOSIS — K439 Ventral hernia without obstruction or gangrene: Secondary | ICD-10-CM | POA: Diagnosis present

## 2023-03-15 DIAGNOSIS — R911 Solitary pulmonary nodule: Secondary | ICD-10-CM | POA: Diagnosis present

## 2023-05-13 ENCOUNTER — Other Ambulatory Visit: Payer: Self-pay | Admitting: Internal Medicine

## 2023-05-13 DIAGNOSIS — R0602 Shortness of breath: Secondary | ICD-10-CM

## 2023-05-13 DIAGNOSIS — I2089 Other forms of angina pectoris: Secondary | ICD-10-CM

## 2023-05-23 ENCOUNTER — Encounter (HOSPITAL_COMMUNITY): Payer: Self-pay

## 2023-05-24 ENCOUNTER — Telehealth (HOSPITAL_COMMUNITY): Payer: Self-pay | Admitting: *Deleted

## 2023-05-24 NOTE — Telephone Encounter (Signed)
Reaching out to patient to offer assistance regarding upcoming cardiac imaging study; wife verbalizes understanding of appt date/time, parking situation and where to check in, pre-test NPO status and medications ordered, and verified current allergies; name and call back number provided for further questions should they arise Johney Frame RN Navigator Cardiac Imaging [facility] [phone] office [phone] cell

## 2023-05-26 ENCOUNTER — Ambulatory Visit
Admission: RE | Admit: 2023-05-26 | Discharge: 2023-05-26 | Disposition: A | Discharge status: Home / Self Care | Payer: Medicare Other | Source: Ambulatory Visit | Attending: Internal Medicine | Admitting: Internal Medicine

## 2023-05-26 ENCOUNTER — Other Ambulatory Visit: Payer: Self-pay | Admitting: Internal Medicine

## 2023-05-26 DIAGNOSIS — I2510 Atherosclerotic heart disease of native coronary artery without angina pectoris: Secondary | ICD-10-CM | POA: Diagnosis not present

## 2023-05-26 DIAGNOSIS — I2089 Other forms of angina pectoris: Secondary | ICD-10-CM | POA: Diagnosis present

## 2023-05-26 DIAGNOSIS — R931 Abnormal findings on diagnostic imaging of heart and coronary circulation: Secondary | ICD-10-CM | POA: Insufficient documentation

## 2023-05-26 DIAGNOSIS — R0602 Shortness of breath: Secondary | ICD-10-CM | POA: Insufficient documentation

## 2023-05-26 MED ORDER — NITROGLYCERIN 0.4 MG SL SUBL
0.4000 mg | SUBLINGUAL_TABLET | Freq: Once | SUBLINGUAL | Status: AC
  Administered 2023-05-26: 0.4 mg via SUBLINGUAL

## 2023-05-26 MED ORDER — DILTIAZEM HCL 25 MG/5ML IV SOLN: 10.0000 mg | INTRAVENOUS | Status: DC | PRN

## 2023-05-26 MED ORDER — METOPROLOL TARTRATE 5 MG/5ML IV SOLN
5.0000 mg | INTRAVENOUS | Status: DC | PRN
  Administered 2023-05-26: 5 mg via INTRAVENOUS

## 2023-05-26 MED ORDER — SODIUM CHLORIDE 0.9 % IV BOLUS
150.0000 mL | Freq: Once | INTRAVENOUS | Status: AC
  Administered 2023-05-26: 150 mL via INTRAVENOUS

## 2023-05-26 MED ORDER — IOHEXOL 350 MG/ML SOLN
75.0000 mL | Freq: Once | INTRAVENOUS | Status: AC | PRN
  Administered 2023-05-26: 75 mL via INTRAVENOUS

## 2023-05-26 MED ORDER — METOPROLOL TARTRATE 5 MG/5ML IV SOLN: 10.0000 mg | INTRAVENOUS | Status: DC | PRN

## 2023-05-26 NOTE — Progress Notes (Signed)
 Patient tolerated procedure well. Ambulate w/o difficulty. Denies light headedness or being dizzy. Sitting up drinking water provided. Encouraged to drink extra water today and reasoning explained. Verbalized understanding. All questions answered. ABC intact. No further needs. Discharge from procedure area w/o issues.

## 2023-08-25 ENCOUNTER — Emergency Department

## 2023-08-25 ENCOUNTER — Emergency Department
Admission: EM | Admit: 2023-08-25 | Discharge: 2023-08-25 | Disposition: A | Discharge status: Home / Self Care | Attending: Emergency Medicine | Admitting: Emergency Medicine

## 2023-08-25 ENCOUNTER — Encounter: Payer: Self-pay | Admitting: Emergency Medicine

## 2023-08-25 ENCOUNTER — Other Ambulatory Visit: Payer: Self-pay

## 2023-08-25 DIAGNOSIS — S51012A Laceration without foreign body of left elbow, initial encounter: Secondary | ICD-10-CM | POA: Diagnosis not present

## 2023-08-25 DIAGNOSIS — G20C Parkinsonism, unspecified: Secondary | ICD-10-CM | POA: Diagnosis not present

## 2023-08-25 DIAGNOSIS — E119 Type 2 diabetes mellitus without complications: Secondary | ICD-10-CM | POA: Insufficient documentation

## 2023-08-25 DIAGNOSIS — R55 Syncope and collapse: Secondary | ICD-10-CM | POA: Diagnosis present

## 2023-08-25 DIAGNOSIS — W19XXXA Unspecified fall, initial encounter: Secondary | ICD-10-CM

## 2023-08-25 DIAGNOSIS — W1830XA Fall on same level, unspecified, initial encounter: Secondary | ICD-10-CM | POA: Insufficient documentation

## 2023-08-25 DIAGNOSIS — S0083XA Contusion of other part of head, initial encounter: Secondary | ICD-10-CM | POA: Insufficient documentation

## 2023-08-25 DIAGNOSIS — S60811A Abrasion of right wrist, initial encounter: Secondary | ICD-10-CM | POA: Insufficient documentation

## 2023-08-25 LAB — CBC
HCT: 37.9 % — ABNORMAL LOW (ref 39.0–52.0)
Hemoglobin: 12.1 g/dL — ABNORMAL LOW (ref 13.0–17.0)
MCH: 30.8 pg (ref 26.0–34.0)
MCHC: 31.9 g/dL (ref 30.0–36.0)
MCV: 96.4 fL (ref 80.0–100.0)
Platelets: 202 10*3/uL (ref 150–400)
RBC: 3.93 MIL/uL — ABNORMAL LOW (ref 4.22–5.81)
RDW: 14 % (ref 11.5–15.5)
WBC: 8.5 10*3/uL (ref 4.0–10.5)
nRBC: 0 % (ref 0.0–0.2)

## 2023-08-25 LAB — BASIC METABOLIC PANEL WITH GFR
Anion gap: 9 (ref 5–15)
BUN: 22 mg/dL (ref 8–23)
CO2: 26 mmol/L (ref 22–32)
Calcium: 8.9 mg/dL (ref 8.9–10.3)
Chloride: 104 mmol/L (ref 98–111)
Creatinine, Ser: 0.76 mg/dL (ref 0.61–1.24)
GFR, Estimated: >60 mL/min (ref >60)
Glucose, Bld: 162 mg/dL — ABNORMAL HIGH (ref 70–99)
Potassium: 3.8 mmol/L (ref 3.5–5.1)
Sodium: 139 mmol/L (ref 135–145)

## 2023-08-25 LAB — TROPONIN I (HIGH SENSITIVITY)
Troponin I (High Sensitivity): 33 ng/L — ABNORMAL HIGH (ref <18)
Troponin I (High Sensitivity): 52 ng/L — ABNORMAL HIGH (ref <18)
Troponin I (High Sensitivity): 56 ng/L — ABNORMAL HIGH (ref <18)

## 2023-08-25 LAB — URINALYSIS, ROUTINE W REFLEX MICROSCOPIC
Bilirubin Urine: NEGATIVE
Glucose, UA: NEGATIVE mg/dL
Hgb urine dipstick: NEGATIVE
Ketones, ur: NEGATIVE mg/dL
Leukocytes,Ua: NEGATIVE
Nitrite: NEGATIVE
Protein, ur: NEGATIVE mg/dL
Specific Gravity, Urine: 1.01 (ref 1.005–1.030)
pH: 5 (ref 5.0–8.0)

## 2023-08-25 MED ORDER — IOHEXOL 350 MG/ML SOLN
75.0000 mL | Freq: Once | INTRAVENOUS | Status: AC | PRN
Start: 2023-08-25 — End: 2023-08-25
  Administered 2023-08-25: 75 mL via INTRAVENOUS

## 2023-08-25 NOTE — ED Provider Notes (Signed)
 [facility] Provider Note    Event Date/Time   First MD Initiated Contact with Patient 08/25/23 1358     (approximate)   History   Loss of Consciousness   HPI {Remember to add pertinent medical, surgical, social, and/or OB history to HPI:1} Bradley [name] is a 88 y.o. male history of type 2 diabetes BPH diabetic ketoacidosis Parkinson     Physical Exam   Triage Vital Signs: ED Triage Vitals  Encounter Vitals Group     BP 08/25/23 1109 107/68     Systolic BP Percentile --      Diastolic BP Percentile --      Pulse Rate 08/25/23 1109 65     Resp 08/25/23 1109 18     Temp 08/25/23 1109 98.1 F (36.7 C)     Temp Source 08/25/23 1109 Oral     SpO2 08/25/23 1109 100 %     Weight 08/25/23 1110 168 lb 14 oz (76.6 kg)     Height 08/25/23 1110 5\' 6"  (1.676 m)     Head Circumference --      Peak Flow --      Pain Score --      Pain Loc --      Pain Education --      Exclude from Growth Chart --     Most recent vital signs: Vitals:   08/25/23 1109  BP: 107/68  Pulse: 65  Resp: 18  Temp: 98.1 F (36.7 C)  SpO2: 100%    {Only need to document appropriate and relevant physical exam:1} General: Awake, no distress. *** CV:  Good peripheral perfusion. *** Resp:  Normal effort. *** Abd:  No distention. *** Other:  ***   ED Results / Procedures / Treatments   Labs (all labs ordered are listed, but only abnormal results are displayed) Labs Reviewed  BASIC METABOLIC PANEL WITH GFR - Abnormal; Notable for the following components:      Result Value   Glucose, Bld 162 (*)    All other components within normal limits  CBC - Abnormal; Notable for the following components:   RBC 3.93 (*)    Hemoglobin 12.1 (*)    HCT 37.9 (*)    All other components within normal limits  URINALYSIS, ROUTINE W REFLEX MICROSCOPIC - Abnormal; Notable for the following components:   Color, Urine YELLOW (*)    APPearance CLEAR (*)    All other components  within normal limits  CBG MONITORING, ED     EKG  ***   RADIOLOGY *** {USE THE WORD "INTERPRETED"!! You MUST document your own interpretation of imaging, as well as the fact that you reviewed the radiologist's report!:1}   PROCEDURES:  Critical Care performed: {CriticalCareYesNo:19197::"Yes, see critical care procedure note(s)","No"}  Procedures   MEDICATIONS ORDERED IN ED: Medications - No data to display   IMPRESSION / MDM / ASSESSMENT AND PLAN / ED COURSE  I reviewed the triage vital signs and the nursing notes.                              Differential diagnosis includes, but is not limited to, ***  Patient's presentation is most consistent with {EM COPA:27473}  *** {If the patient is on the monitor, remove the brackets and asterisks on the sentence below and remember to document it as a Procedure as well. Otherwise delete the sentence below:1} {**The patient is on the cardiac monitor  to evaluate for evidence of arrhythmia and/or significant heart rate changes.**} {Remember to include, when applicable, any/all of the following data: independent review of imaging independent review of labs (comment specifically on pertinent positives and negatives) review of specific prior hospitalizations, PCP/specialist notes, etc. discuss meds given and prescribed document any discussion with consultants (including hospitalists) any clinical decision tools you used and why (PECARN, NEXUS, etc.) did you consider admitting the patient? document social determinants of health affecting patient's care (homelessness, inability to follow up in a timely fashion, etc) document any pre-existing conditions increasing risk on current visit (e.g. diabetes and HTN increasing danger of high-risk chest pain/ACS) describes what meds you gave (especially parenteral) and why any other interventions?:1}     FINAL CLINICAL IMPRESSION(S) / ED DIAGNOSES   Final diagnoses:  None     Rx / DC  Orders   ED Discharge Orders     None        Note:  This document was prepared using Dragon voice recognition software and may include unintentional dictation errors.

## 2023-08-25 NOTE — ED Triage Notes (Signed)
 Pt here via [facility] after a syncopal episode today. Pt states he was turning his head while outside and then passed out. Pt hit his forehead, small abrasion. Pt also has his left arm wrapped in gauze by ems.

## 2023-08-25 NOTE — ED Triage Notes (Signed)
 First Nurse Note;  Pt via [facility] from [facility]. Report "turned quick" and had a syncopal episode. Hematoma over the R eye, multiple skin tears to the bilateral arms. Denies any blood thinners. Pt is A&Ox4 and NAD 20 G L forearm  110/70 BP  62 HR

## 2023-08-25 NOTE — ED Notes (Signed)
 Wounds on both arms cleaned, & bandaged.

## 2023-08-25 NOTE — ED Notes (Signed)
 Please call his wife when ready for discharge and she will male arrangements to get him home

## 2023-08-25 NOTE — Discharge Instructions (Signed)
 Please take your baby aspirin as prescribed.  Please also follow-up with your cardiologist for further management of your syncopal episode.

## 2023-08-25 NOTE — ED Notes (Signed)
 This RN called the patients spouse Conservator, museum/gallery) at this time to notify her that the patient is being discharged. Per Wife, [facility] transport will pick patient up.

## 2023-08-25 NOTE — ED Provider Notes (Signed)
.-----------------------------------------   3:29 PM on 08/25/2023 -----------------------------------------  Blood pressure (!) 143/82, pulse 60, temperature 98.4 F (36.9 C), temperature source Oral, resp. rate 18, height 5\' 6"  (1.676 m), weight 76.6 kg, SpO2 99%.  Assuming care from Dr. Fanny Bien.  In short, Bradley [name] is a 88 y.o. male with a chief complaint of Loss of Consciousness .  Refer to the original H&P for additional details.  The current plan of care is to follow up CTA head/neck and trop, if no critical stenosis, likely dc.  Independent review of labs and imaging are below.  Shared decision making with patient who is agreeable plan for discharge, no near syncopal episodes here, he is at his baseline.  Considered but no indication for inpatient admission at this time, he is safe for outpatient management.  Will discharge with strict precautions, instructed him to follow-up with his primary care doctor as well as cardiologist for further management of his symptoms.  Clinical Course as of 08/25/23 2009  Thu Aug 25, 2023  1448 Wife reports if discharged, can call her to get hold of a person to bring him home. [phone] [MQ]  1449 [name] or [name], can pick up per wife as needed. [MQ]  1858 Troponin I (High Sensitivity)(!) Troponin is stable. [TT]  1947 CT ANGIO HEAD NECK W WO CM IMPRESSION: 1. No emergent large vessel occlusion or hemodynamically significant stenosis of the head or neck. 2. Mild bilateral carotid bifurcation atherosclerosis without hemodynamically significant stenosis.   [TT]  2008 Independent review of labs, UA not consistent with UTI, Trope x 2 is stable, electrolytes not severely deranged, no leukocytosis. [TT]    Clinical Course User Index [MQ] Sharyn Creamer, MD [TT] Jodie Echevaria Franchot Erichsen, MD      Claybon Jabs, MD 08/25/23 2009

## 2023-12-24 IMAGING — CT CT CHEST W/O CM
2 of 4 series · 12 of 36 positions shown, 15 images · non-contrast
Comparison: Radiograph report 09/29/2021, images not retrievable at
the time of this dictation
COMPARISON: Radiograph report 09/29/2021, images not retrievable at
the time of this dictation

Addendum:
CLINICAL DATA: Lung nodule rib fractures



[Series 2: chest 2.00 · axial · 0.69mm/px · z∈[-1222,-918]mm · 9 of 180 slices shown, 12 images]
[im 14/180  mediastinal]
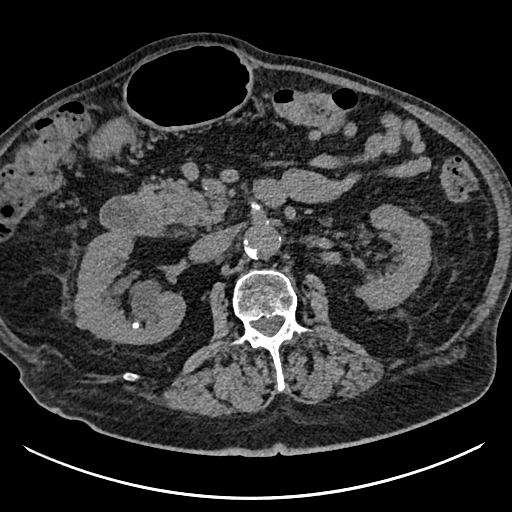
[im 14/180  lung]
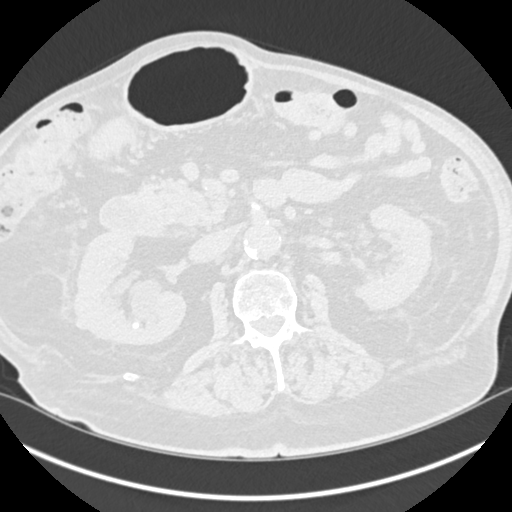
[im 42/180  lung]
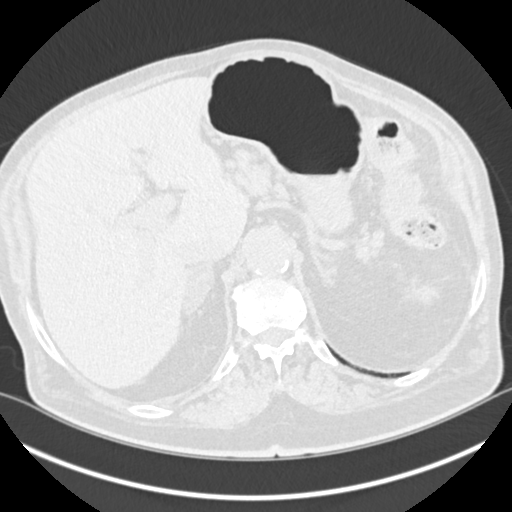
[im 56/180  lung]
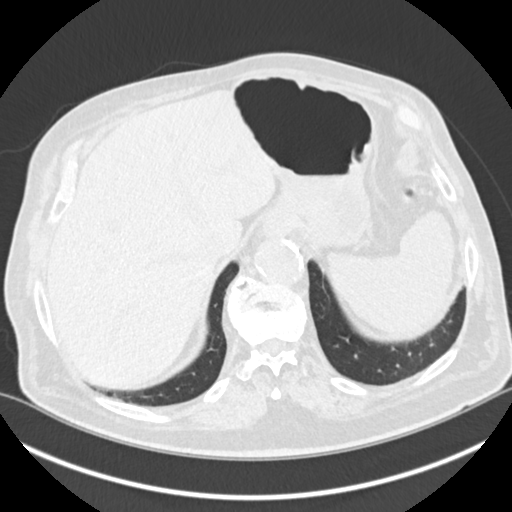
[im 69/180  lung]
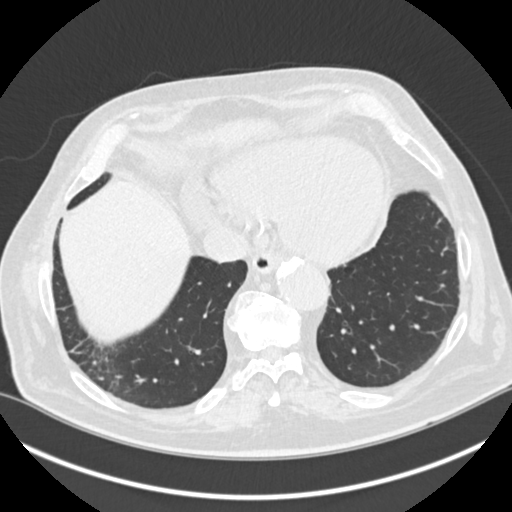
[im 97/180  mediastinal]
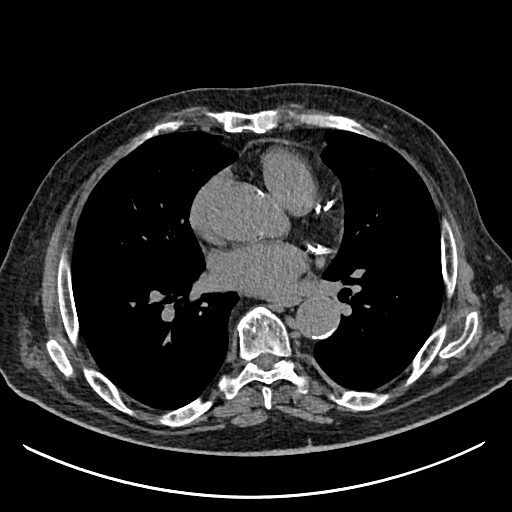
[im 97/180  lung]
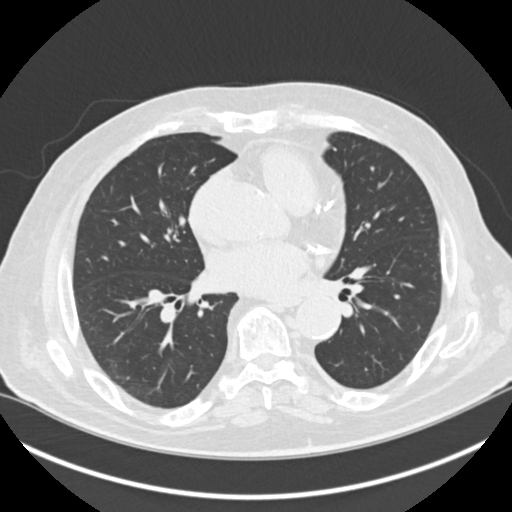
[im 111/180  lung]
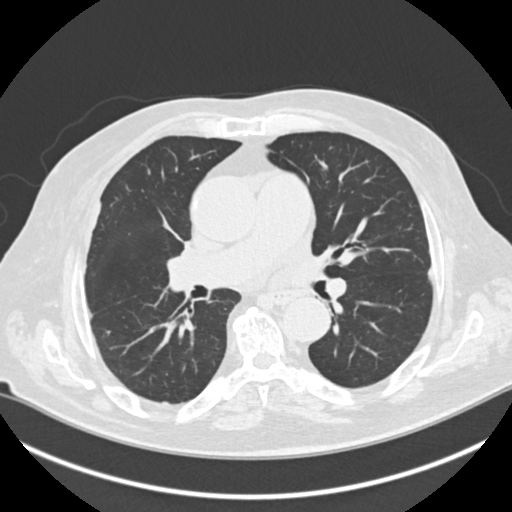
[im 124/180  lung]
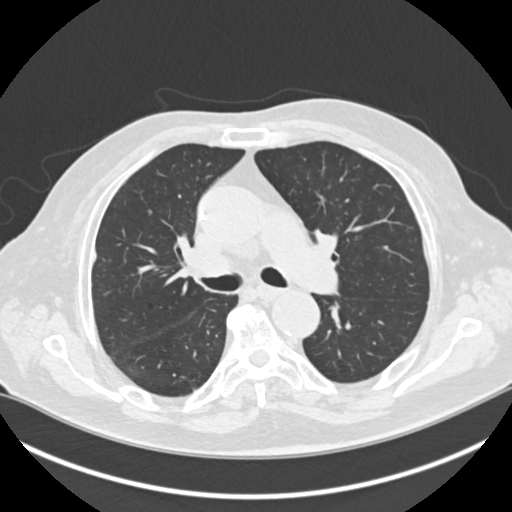
[im 152/180  lung]
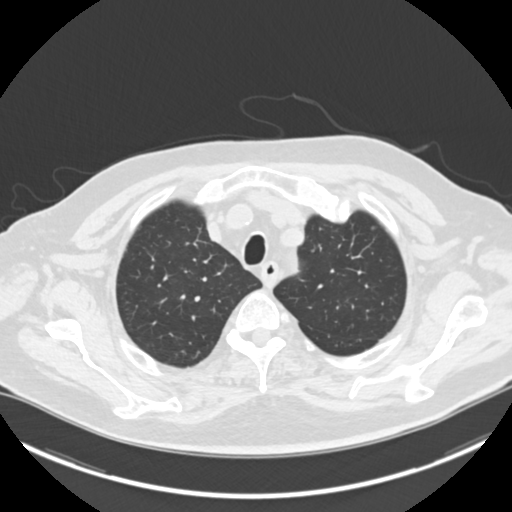
[im 166/180  mediastinal]
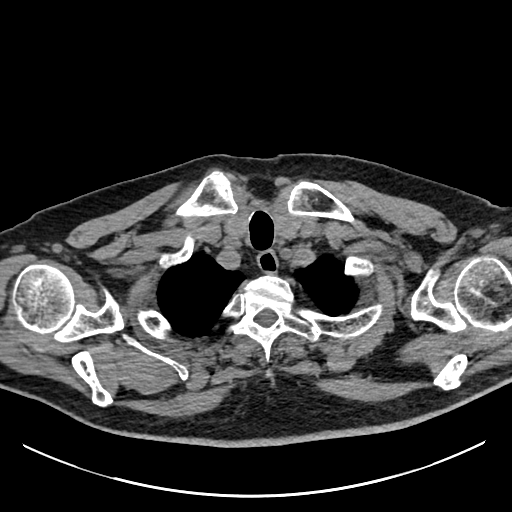
[im 166/180  lung]
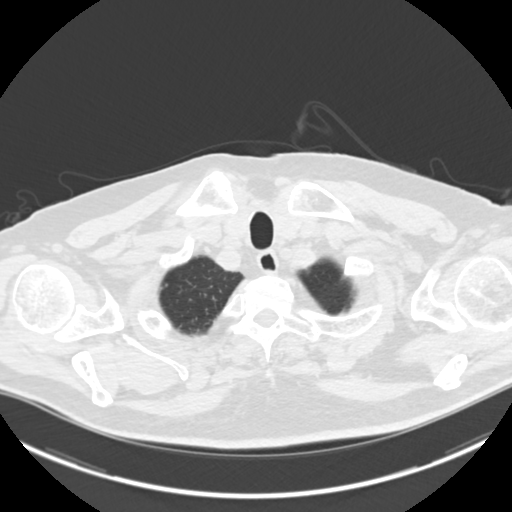

[Series 5: coronals chest 2.00 cor · coronal · 0.69mm/px · 3 of 160 slices shown]
[im 32/160  lung]
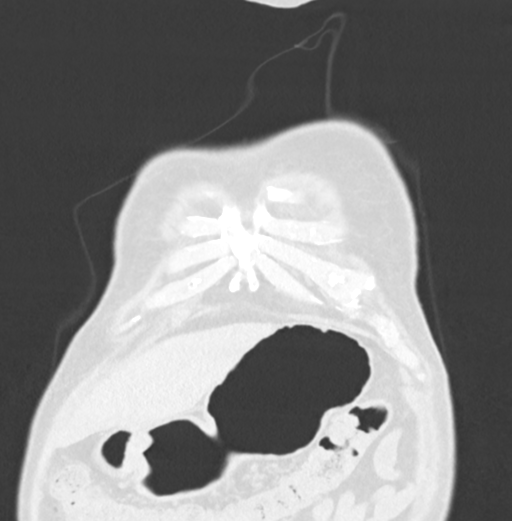
[im 64/160  lung]
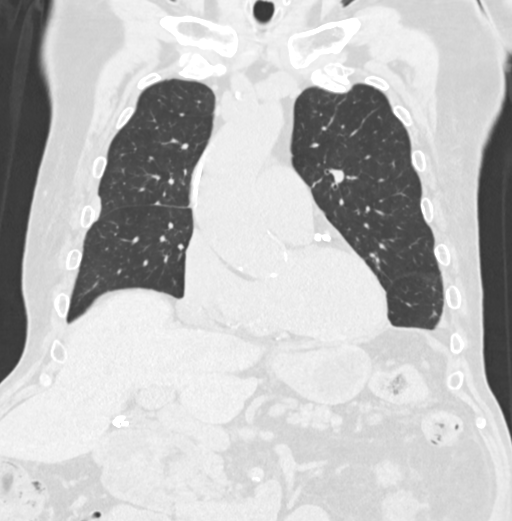
[im 96/160  lung]
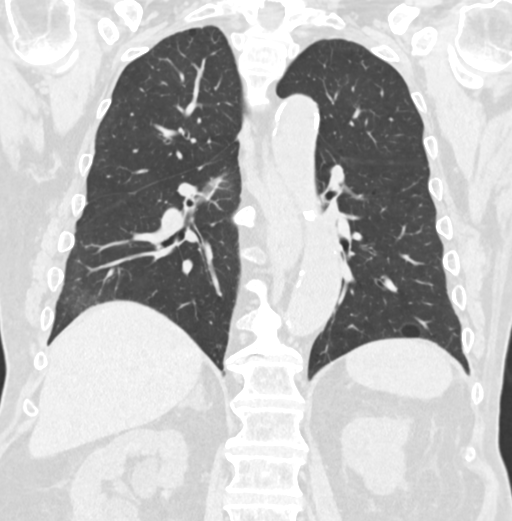

[12 of 36 positions shown; findings below may reference images not displayed]

FINDINGS: Cardiovascular: Normal cardiac size.Ascending aorta measures up to
4.4 cm (series 2, image 68).Extensive coronary artery
atherosclerosis.Small pericardial effusion.

Mediastinum/Nodes: No lymphadenopathy.The thyroid is
unremarkable.Esophagus is unremarkable.The trachea is unremarkable.

Lungs/Pleura: No focal airspace consolidation.There are noncalcified
pleural plaques bilaterally with some pleural nodularity, most
prominent along the right upper lobe. Largest area of nodularity
measures up to 6 x 7 mm (series 3, image 41).There are few
additional 2-3 mm nodules in the upper lobes. Tiny calcified
granuloma in the subpleural left upper lobe. There is right basilar
peripheral reticulation and ground-glass likely related to adjacent
rib fractures. No pleural effusion. No pneumothorax. No focal
airspace consolidation.

Upper Abdomen: There is a 2.6 x 1.9 cm right adrenal nodule
measuring as low as 4 Hounsfield units, stable since August 2016 and
consistent with a benign adenoma (series 2, image 142). No follow-up
is recommended. The left adrenal gland is normal. There are
bilateral renal sinus cysts. There are bilateral nonobstructive
renal stones, measuring up to 4 mm on the right and 8 mm on the
left. There is a hyperdense left renal cyst measuring 7 mm which is
likely hemorrhagic or proteinaceous cyst (series 2, image 163).
Prior cholecystectomy. Colonic diverticulosis.

Musculoskeletal: There are multiple nondisplaced right anterolateral
rib fractures, involving ribs 6, 7, and 8. There is a benign bone
island in the anterior right second rib. There is a vertically
oriented ovoid sclerotic lesion within the T6 vertebral body
posteriorly (series 7, image 90). There are likely multiple thoracic
spine vertebral body hemangiomas. Mild-to-moderate multilevel
degenerative changes of the spine.
IMPRESSION: Noncalcified pleural plaques/nodularity bilaterally, most prominent
along the right upper lobe with largest area of nodularity measuring
up to 6 x 7 mm. Additional tiny 2-3 mm nodules in the upper lobes.
Non-contrast chest CT at 3-6 months is recommended. If the nodules
are stable at time of repeat CT, then future CT at 18-24 months
(from today's scan) is considered optional for low-risk patients,
but is recommended for high-risk patients. This recommendation
follows the consensus statement: Guidelines for Management of
Incidental Pulmonary Nodules Detected on CT Images: From the

Nondisplaced right anterolateral 6th, 7th, and 8th rib fractures.
Adjacent atelectasis in the right lower lobe. No pleural effusion or
pneumothorax.

Single, vertically oriented ovoid sclerotic lesion within the T6
vertebral body posteriorly, likely benign in the absence of a known
malignancy. Correlate with PSA.

Stable benign right adrenal adenoma.

ADDENDUM:
Additional finding which requires follow-up:

Ascending aortic aneurysm, measuring up to 4.4 cm. Recommend annual
imaging followup by CTA or MRA. This recommendation follows 5060
ACCF/AHA/AATS/ACR/ASA/SCA/JEIMS WILLS/TIGER/FERMON/JUNES Guidelines for the
Diagnosis and Management of Patients with Thoracic Aortic Disease.
Circulation. 5060; 121: [id number]. Aortic aneurysm NOS
([id number].2).

*** End of Addendum ***
FINDINGS: Cardiovascular: Normal cardiac size.Ascending aorta measures up to
4.4 cm (series 2, image 68).Extensive coronary artery
atherosclerosis.Small pericardial effusion.

Mediastinum/Nodes: No lymphadenopathy.The thyroid is
unremarkable.Esophagus is unremarkable.The trachea is unremarkable.

Lungs/Pleura: No focal airspace consolidation.There are noncalcified
pleural plaques bilaterally with some pleural nodularity, most
prominent along the right upper lobe. Largest area of nodularity
measures up to 6 x 7 mm (series 3, image 41).There are few
additional 2-3 mm nodules in the upper lobes. Tiny calcified
granuloma in the subpleural left upper lobe. There is right basilar
peripheral reticulation and ground-glass likely related to adjacent
rib fractures. No pleural effusion. No pneumothorax. No focal
airspace consolidation.

Upper Abdomen: There is a 2.6 x 1.9 cm right adrenal nodule
measuring as low as 4 Hounsfield units, stable since August 2016 and
consistent with a benign adenoma (series 2, image 142). No follow-up
is recommended. The left adrenal gland is normal. There are
bilateral renal sinus cysts. There are bilateral nonobstructive
renal stones, measuring up to 4 mm on the right and 8 mm on the
left. There is a hyperdense left renal cyst measuring 7 mm which is
likely hemorrhagic or proteinaceous cyst (series 2, image 163).
Prior cholecystectomy. Colonic diverticulosis.

Musculoskeletal: There are multiple nondisplaced right anterolateral
rib fractures, involving ribs 6, 7, and 8. There is a benign bone
island in the anterior right second rib. There is a vertically
oriented ovoid sclerotic lesion within the T6 vertebral body
posteriorly (series 7, image 90). There are likely multiple thoracic
spine vertebral body hemangiomas. Mild-to-moderate multilevel
degenerative changes of the spine.
IMPRESSION: Noncalcified pleural plaques/nodularity bilaterally, most prominent
along the right upper lobe with largest area of nodularity measuring
up to 6 x 7 mm. Additional tiny 2-3 mm nodules in the upper lobes.
Non-contrast chest CT at 3-6 months is recommended. If the nodules
are stable at time of repeat CT, then future CT at 18-24 months
(from today's scan) is considered optional for low-risk patients,
but is recommended for high-risk patients. This recommendation
follows the consensus statement: Guidelines for Management of
Incidental Pulmonary Nodules Detected on CT Images: From the

Nondisplaced right anterolateral 6th, 7th, and 8th rib fractures.
Adjacent atelectasis in the right lower lobe. No pleural effusion or
pneumothorax.

Single, vertically oriented ovoid sclerotic lesion within the T6
vertebral body posteriorly, likely benign in the absence of a known
malignancy. Correlate with PSA.

Stable benign right adrenal adenoma.

## 2024-04-09 NOTE — Progress Notes (Signed)
 Ref Provider: Diedra Jerona Darilyn DEWAINE  PCP: Diedra Jerona Darilyn, MD  Assessment and Plan:  In most patients we give written parts of assessment and plan to patient under Patient Instructions/After Visit Summary. So some parts are directed to patient.  Dear Mr. Bradley [name], It was our pleasure to participate in your care. We have typed up brief summary of what we discussed. Assessment & Plan Parkinson's disease with cognitive impairment and mood instability Worsening symptoms including tremors, slowness, and stiffness, primarily in the hands and jaw. Cognitive concerns and mood lability present. Current medications include carbidopa -levodopa  and ropinirole . Lamotrigine dosage can be increased if needed. He is learning to control tremors and is using devices from [employer] for tremor management.  I reviewed labs, imaging, and notes in [place], [place], and from outside providers, if available.  Reviewed 08/02/2023 vitamin B12 and D levels - normal   - Refill medications  - Continue carbidopa -levodopa  and ropinirole . - Will consider increasing lamotrigine dosage if needed. - Continue speech and physical therapy.  2. Right cervical lymphadenopathy Newly discovered lump in the right side of the neck, non-painful and mushy upon palpation. Differential diagnosis includes reactive lymphadenopathy due to upper respiratory tract infection or malignancy. Urgent ENT evaluation is warranted to rule out serious conditions.  - Urgent ENT consult for right upper neck mass - Ordered urgent ENT consult for evaluation of right cervical lymphadenopathy. - Advised to visit [facility] for urgent evaluation.  CONSIDERED COMORBIDITIES BELOW Flatulence  Follow up in 4-5 months with Kaitlin Caffaro, PA-C  Return in about 5 months (around 09/09/2024) for with Kaitlin Paich PA-C.  This note has been created using automated tools and reviewed for accuracy by [facility]. Interim History date  04/11/2024   Bradley [name] is a 88 y.o. male here for treatment and evaluation of Parkinson's disease.  Bradley [name] last visit was on 12/06/2023.  Patient states symptoms are worse. Wife that as the day goes on his tremors get worse. His balance is still unsteady and he has been attending boxing program and thinks it is helping.  Patient is concerned of a knot on the right side of his neck/jaw  that has been their for a couple days with no pain.  Patient is still in  speech and Physical Therapy at [facility].  History of Present Illness Bradley [name] is an 88 year old male with Parkinson's disease who presents with worsening symptoms and a new neck lump.  Parkinsonian motor and non-motor symptoms - Worsening tremors, primarily affecting the hands and occasionally the jaw - Increased unsteadiness - Mood lability - Symptoms have worsened since last neurology visit on December 06, 2023 - Current medications: carbidopa -levodopa  (two tablets four times daily and one long-acting tablet at night), ropinirole  0.5 mg four times daily, lamotrigine for mood lability - Participates in speech and physical therapy, including 'rock steady boxing'  Cervical mass - New lump on the right side of the neck, discovered two days prior to visit - Lump described as mushy and non-tender - No associated pain or discomfort - Sticky skin may have delayed discovery of the lump  I reviewed labs, imaging, and notes in [employer], [facility], and from outside providers, if available.   Results    Disease Summary:     Bradley [name] is a right handed 88 y.o. male retiree here for evaluation of Tremors  Parkinsonism - in a patient with resting tremors (jaw tremor and bilateral hands) + postural instability + REM behavior disorder -  still symptomatic but not particularly bothersome to patient: Patient states he has been having resting tremors in his bilateral hands, worse in left hand, and jaw since  around April 2018.  The jaw tremor started around June 2018 which was around the time he had kidney stones and associated pain.  The tremors occur at rest but also affect him when performing tasks such as eating or drinking.  He thinks this started in the left then progressed to the right hand as well.  Tremors came on gradually.  He is unsure if alcohol temporarily helps his tremors.  He drinks around two caffeinated beverages daily. Wife has noticed a decreased in patient's volume in speech. He has a family history of Parkinson's disease in his sister which developed in her late 82s or early 25s.  No other known Family History of Parkinson's disease.  He has not tried any medication for his tremors.  He denies any history of head injury.  Patient states he is also having issues with his balance and gait.  He has a shuffling gait.  He has feeling of stiffness.  He complains of urinary urgency. Notes he has numbness in his fingertips, though notes he is more concerned with the increased shaking in his hands. More in the left hand, but now the right as well. Right hand dominant. Poor dexterity in bilateral hands.   Mr. Perrier denied decreased sense of smell, drooling, slowness of the movements, constipation, vivid dreams at night.  Medication: Carbidopa -Levodopa  25/100, Carbidopa -Levodopa  CR, Ropinirole , Rivastigmine (discontinued), Namenda   Obstructive sleep apnea: Patient has obstructive sleep apnea. Previously using CPAP CPAP with pressure setting of 7cm H2O, discontinued as obstructive sleep apnea improved with 65 pound weight loss.   REM behavior disorder: Taking Melatonin.  Right leg burning pain and numbness: He complains today of pain behind his right knee that radiates posteriorly to his thighs and buttock area. He describes pain as sharp shooting. Pain interferes with his walking and balance.  Benign Positional Paroxysmal Vertigo: Patient says dizziness comes and goes and can last all  day. Patient can be sitting and he will still be dizzy. Patient says if he stands and leans his head back he will get dizzy. It is easier for patient to walk in the street rather than the side walk. Previously had similar issues in 2015.   Vitamin D  deficiency: taking oral supplements   Cognitive Impairment - concerning for Parkinson's disease dementia: Per wife he has been having difficulty with performing task that he used to do in the past, such as starting the dryer. Patient states he is 88 years old, so his memory is not going to be the same. Per wife, he has been having new cognitive issues. Per wife she is concerned about his memory, as she is scheduled to have surgery and she will not be mobile to provide assistance when needed.   Patient endorses getting frustrated more easily, he cannot keep track of appointments. 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 He also reports word finding difficulties.   Has restricted license per [facility], re-evaluated yearly continue to monitor for safety concerns while driving- still driving 87/4/76.  SLUMS 04/02/2021: 24/30   MRI Brain 12/30/2016: No specific or reversible explanation for the history. Mild for age cerebral volume loss and chronic microvascular ischemic change  Medications: Namenda   History of closed, non-displaced fracture of 6th,7th,8th ribs, right-side   Patient has history of right knee replacement in  2006: impaired gait since. Uses cane.   Protein Calorie Malnutrition/Unintentional Weight Loss: Sent nutrition consult. Complaint of ongoing chronic weight loss. Poor appetite and fulling full sooner. Wife feels patient nutrition is not appropriate and is concerned for his consumption of potato chips. Patient does report eating fruits, vegetables, etc.   Abdominal distension, hepatomegaly in patient with 70 lb weight loss since 2020, non-painful to palpation, no ascites: CT Abdomen reviewed. Reviewed consult with  Dr. Cesar Coe who personally reviewed images and did not feel findings are consistent with a hernia thus no indication for surgery.  Findings were thought to be related to nerve root denervation.  There is no history of spinal surgery.  Question component of Parkinson's disease.  Appreciate general surgery consult  CT Abdomen and Pelvis with and without IV and Oral Contrast 02/22/2022: Large, broad-based right eccentric ventral hernia, similar in appearance and configuration to prior examination dated 2018. Nonobstructive bilateral nephrolithiasis. Diverticulosis without evidence of acute diverticulitis. Prostatomegaly. Coronary artery disease.   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 Acute intraparenchymal hematoma in the right occipital lobe measuring up to 12 mm with small amount of adjacent subarachnoid hemorrhage. No mass effect or midline shift.  2. No acute fracture or traumatic listhesis in the cervical spine.  3. Medialized right vocal fold, suggestive of right vocal cord  paresis.    CT Head (Brain) 08/25/2022 - 1. Interval resolution of previously noted right occipital intra-axial hemorrhage and small volume subarachnoid blood. No new hemorrhage. Mild residual hypodensity within the right occipital  pole either reflecting resolving edema versus developing focus of  encephalomalacia. 2. Atrophy and chronic small vessel ischemic changes of the white matter.  Mood instability Irritability and emotional changes possibly related to Parkinson's disease. Win use.  Medications: Lamotrigine , Wellbutrin  and Zoloft    Flatulence  Right cervical lymphadenopathy Newly discovered lump in the right side of the neck, non-painful and mushy upon palpation. Differential diagnosis includes reactive lymphadenopathy due to upper respiratory tract infection or malignancy. Urgent ENT evaluation is warranted to rule out serious conditions.  Physical Exam   Vitals Vitals:   04/11/24 1010  BP: 98/62  Pulse: 68  SpO2: 98%  Weight: 69.9 kg (154 lb)  Height: 167.6 cm (5' 6)  PainSc: 0-No pain   Body mass index is 24.86 kg/m.  (Some of the exam changes noted are from previous clinical observations)  Physical Exam   General Exam Mild miosis bilaterally Bilateral intraocular lenses Upper and lower dentures Pitting edema of bilateral lower extremities Right knee replacement scar Abdominal distension without ascites, displaced to right Hepatomegaly, mild Non-tender abdominal exam without guarding or rebound   Neurological Exam Decreased hearing bilaterally. Patient wearing hearing aids Tremors are present in bilateral hands and jaw, tremors are mostly postural, kinetic in nature. Mild left hand tremor  Some incoordination in bilateral hands Minimal cog wheeling in left hand Right Finger taps: moderate Bradykinesia, moderate  decrement, no motion arrest Left Finger taps: moderate  Bradykinesia, moderate decrement, no motion arrest Mild cog wheeling in right upper extremity  Right toe taps: severe Bradykinesia, moderate  decrement, no motion arrest Left toe taps: moderate  Bradykinesia, moderate decrement, no motion arrest Left decreased arm swing Right genu valgus deformity. Three point turn, well balanced Right shoulder is higher  Able to stand with  assistance from arm Romberg's negative   SLUMS 04/02/2021: 24/30  Medications: Current Outpatient Medications on File Prior to Visit  Medication Sig Dispense Refill  . acetaminophen  (TYLENOL ) 500 MG tablet Take 500 mg by mouth every 6 (six) hours as needed for Pain.    . albuterol  MDI, PROVENTIL , VENTOLIN , PROAIR , HFA 90 mcg/actuation inhaler USE 2 INHALATIONS BY MOUTH EVERY 6 HOURS AS NEEDED FOR WHEEZING 54 g 2  . ANTIOX #8/OM3/DHA/EPA/LUT/ZEAX (PRESERVISION AREDS 2, OMEGA-3, ORAL) Take by mouth.    . aspirin  81 MG EC tablet Take 81 mg by mouth once daily 30 tablet 11  . atorvastatin  (LIPITOR) 20 MG tablet TAKE 1 TABLET BY MOUTH ONCE  DAILY 90 tablet 3  . BIOTIN ORAL Take by mouth Twice a day    . brimonidine -timolol  (COMBIGAN ) 0.2-0.5 % ophthalmic solution 1 drop 2 (two) times daily.    . buPROPion  (WELLBUTRIN  XL) 150 MG XL tablet TAKE 1 TABLET BY MOUTH ONCE  DAILY (Patient taking differently: Take 150 mg by mouth every morning) 90 tablet 3  . cholecalciferol  (VITAMIN D3) 1000 unit tablet Take by mouth    . docusate (COLACE) 100 MG capsule Take 100 mg by mouth 2 (two) times daily    . finasteride  (PROSCAR ) 5 mg tablet TAKE 1 TABLET BY MOUTH ONCE  DAILY 90 tablet 3  . ipratropium (ATROVENT ) 0.06 % nasal spray USE 2 SPRAYS IN BOTH NOSTRILS  TWICE DAILY AS NEEDED FOR  RHINITIS 75 mL 3  . lamoTRIgine (LAMICTAL) 25 MG tablet Take 1 tablet (25 mg total) by mouth every 12 (twelve) hours 180 tablet 3  . melatonin 5 mg Cap Take 10 mg by mouth nightly       . metFORMIN (GLUCOPHAGE-XR) 500 MG XR tablet Take 1 tablet (500 mg total) by mouth daily with dinner 90 tablet 3  . pantoprazole  (PROTONIX ) 40 MG DR tablet Take 1 tablet (40 mg total) by mouth once daily Am 90 tablet 3  . polyethylene glycol (MIRALAX ) powder Take 17 g by mouth once daily Mix in 4-8ounces of fluid prior to taking.    . rOPINIRole  (REQUIP ) 0.5 MG tablet Take 1 tablet (0.5 mg total) by mouth 4 (four) times daily 360 tablet 3  .  sertraline  (ZOLOFT ) 100 MG tablet TAKE 1 TABLET BY MOUTH ONCE  DAILY 90 tablet 3  . TORsemide (DEMADEX) 20 MG tablet Take 1 tablet (20 mg total) by mouth every Monday, Wednesday, and Friday (Patient taking differently: Take 10 mg by mouth as directed (edema) Monday- Saturday) 36 tablet 3   No current facility-administered medications on file prior to visit.   Past Medical History:  Past Medical History:  Diagnosis Date  . Arthritis   . Chronic obstructive pulmonary disease (CMS/HHS-HCC) 09/26/2023  . Dementia (CMS-HCC)   . Diabetes mellitus type 2, uncomplicated (CMS/HHS-HCC)   . GERD (gastroesophageal reflux disease)   . Glaucoma (increased eye pressure)   . History of stroke Unknown mri showed  . Hyperlipidemia   . Hypertension   . Macular degeneration   . Multiple gastric ulcers   . Parkinson disease (CMS/HHS-HCC)  12/2016  . Sleep apnea 01/2015   Now have cpac machine  . Tremor     Past Surgical History:  Past Surgical History:  Procedure Laterality Date  . JOINT REPLACEMENT  05/2004   Rich knee replacement  . EGD @ [facility]  11/10/2022   (Inpatient) Inpatient EGD with ulcers. Biopsies negative for H pylori. Repeat EGD prn/CTL  . BLEPHAROPLASTY    . CATARACT EXTRACTION    . CHOLECYSTECTOMY    . Kidney Stone Removal  1983, 1987, 1990  . KNEE ARTHROSCOPY     Family History:  Family History  Problem Relation Name Age of Onset  . Cancer Sister Mardeen        Lung  . Hyperlipidemia (Elevated cholesterol) Sister Mardeen   . High blood pressure (Hypertension) Sister Mardeen   . Parkinsonism Sister Mardeen   . Tremor Sister Mardeen   . Dementia Sister Mardeen   . Diabetes Sister Mardeen   . Cancer Sister Mercer        Lung  . Hyperlipidemia (Elevated cholesterol) Sister Vivian   . High blood pressure (Hypertension) Sister Vivian   . Dementia Sister Vivian   . Parkinsonism Sister Vivian   . Tremor Sister Vivian   . Myocardial Infarction (Heart attack) Father Prentice   . High blood  pressure (Hypertension) Father Prentice   . Stroke Father Prentice   . Coronary Artery Disease (Blocked arteries around heart) Father Prentice   . Myocardial Infarction (Heart attack) Mother Geofm   . Hyperlipidemia (Elevated cholesterol) Mother Geofm   . High blood pressure (Hypertension) Mother Geofm   . Coronary Artery Disease (Blocked arteries around heart) Mother Geofm   . Dementia Mother Geofm   . Alzheimer's disease Mother Geofm   . Myocardial Infarction (Heart attack) Brother Ed   . Hyperlipidemia (Elevated cholesterol) Brother Ed   . High blood pressure (Hypertension) Brother Ed   . Parkinsonism Brother Ed    Social History:  Social History   Socioeconomic History  . Marital status: Married  Tobacco Use  . Smoking status: Former    Current packs/day: 0.00    Types: Cigarettes    Quit date: 11/20/1998    Years since quitting: 25.4  . Smokeless tobacco: Never  Vaping Use  . Vaping status: Never Used  Substance and Sexual Activity  . Alcohol use: Yes    Alcohol/week: 4.0 standard drinks of alcohol    Types: 2 Cans of beer, 2 Shots of liquor per week  . Drug use: No  . Sexual activity: Not Currently    Partners: Female    Birth control/protection: Surgical, Other-see comments   Social Drivers of Health   Financial Resource Strain: Low Risk  (11/15/2023)   Overall Financial Resource Strain (CARDIA)   . Difficulty of Paying Living Expenses: Not hard at all  Food Insecurity: No Food Insecurity (11/15/2023)   Hunger Vital Sign   . Worried About Programme Researcher, Broadcasting/film/video in the Last Year: Never true   . Ran Out of Food in the Last Year: Never true  Transportation Needs: No Transportation Needs (11/15/2023)   PRAPARE - Transportation   . Lack of Transportation (Medical): No   . Lack of Transportation (Non-Medical): No  Housing Stability: Low Risk  (11/15/2023)   Housing Stability Vital Sign   . Unable to Pay for Housing in the Last Year: No   . Number of Times Moved in the Last Year: 1    . Homeless in the Last Year: No  Allergies:  Allergies  Allergen Reactions  . Penicillin Rash  . Sulfa (Sulfonamide Antibiotics) Hives    As infant  . Sulfasalazine Unknown    As infant     Dr. Jannett Fairly

## 2024-04-12 ENCOUNTER — Other Ambulatory Visit: Payer: Self-pay | Admitting: Otolaryngology

## 2024-04-12 DIAGNOSIS — R221 Localized swelling, mass and lump, neck: Secondary | ICD-10-CM

## 2024-04-13 ENCOUNTER — Ambulatory Visit
Admission: RE | Admit: 2024-04-13 | Discharge: 2024-04-13 | Disposition: A | Discharge status: Home / Self Care | Source: Ambulatory Visit | Attending: Otolaryngology | Admitting: Otolaryngology

## 2024-04-13 DIAGNOSIS — R221 Localized swelling, mass and lump, neck: Secondary | ICD-10-CM

## 2024-04-13 MED ORDER — IOPAMIDOL (ISOVUE-300) INJECTION 61%
75.0000 mL | Freq: Once | INTRAVENOUS | Status: AC | PRN
Start: 2024-04-13 — End: 2024-04-13
  Administered 2024-04-13: 75 mL via INTRAVENOUS

## 2024-04-17 ENCOUNTER — Other Ambulatory Visit: Payer: Self-pay | Admitting: Pulmonary Disease

## 2024-04-17 DIAGNOSIS — R590 Localized enlarged lymph nodes: Secondary | ICD-10-CM

## 2024-04-18 ENCOUNTER — Ambulatory Visit
Admission: RE | Admit: 2024-04-18 | Discharge: 2024-04-18 | Disposition: A | Discharge status: Home / Self Care | Source: Ambulatory Visit | Attending: Pulmonary Disease | Admitting: Pulmonary Disease

## 2024-04-18 DIAGNOSIS — R590 Localized enlarged lymph nodes: Secondary | ICD-10-CM | POA: Diagnosis present

## 2024-04-18 LAB — POCT I-STAT CREATININE: Creatinine, Ser: 1 mg/dL (ref 0.61–1.24)

## 2024-04-18 MED ORDER — IOHEXOL 300 MG/ML  SOLN
100.0000 mL | Freq: Once | INTRAMUSCULAR | Status: AC | PRN
  Administered 2024-04-18: 100 mL via INTRAVENOUS

## 2024-04-18 NOTE — Progress Notes (Signed)
 Bradley [name] POUR, MD sent to Bradley [name] RAMAN Approved for US  guided core biopsy of right neck lymph node.  Metastatic head and neck cancer.  No sedation.  HKM

## 2024-04-26 ENCOUNTER — Other Ambulatory Visit: Payer: Self-pay | Admitting: Otolaryngology

## 2024-04-26 DIAGNOSIS — R599 Enlarged lymph nodes, unspecified: Secondary | ICD-10-CM

## 2024-04-27 ENCOUNTER — Other Ambulatory Visit: Payer: Self-pay | Admitting: Neurology

## 2024-04-27 DIAGNOSIS — G44209 Tension-type headache, unspecified, not intractable: Secondary | ICD-10-CM

## 2024-04-27 DIAGNOSIS — R4189 Other symptoms and signs involving cognitive functions and awareness: Secondary | ICD-10-CM

## 2024-04-27 DIAGNOSIS — G20C Parkinsonism, unspecified: Secondary | ICD-10-CM

## 2024-04-27 DIAGNOSIS — F02818 Dementia in other diseases classified elsewhere, unspecified severity, with other behavioral disturbance: Secondary | ICD-10-CM

## 2024-04-27 DIAGNOSIS — R590 Localized enlarged lymph nodes: Secondary | ICD-10-CM

## 2024-04-30 ENCOUNTER — Ambulatory Visit
Admission: RE | Admit: 2024-04-30 | Discharge: 2024-04-30 | Disposition: A | Source: Ambulatory Visit | Attending: Neurology | Admitting: Neurology

## 2024-04-30 DIAGNOSIS — G44209 Tension-type headache, unspecified, not intractable: Secondary | ICD-10-CM

## 2024-04-30 DIAGNOSIS — R4189 Other symptoms and signs involving cognitive functions and awareness: Secondary | ICD-10-CM

## 2024-04-30 DIAGNOSIS — F02818 Dementia in other diseases classified elsewhere, unspecified severity, with other behavioral disturbance: Secondary | ICD-10-CM

## 2024-04-30 DIAGNOSIS — G20C Parkinsonism, unspecified: Secondary | ICD-10-CM

## 2024-04-30 DIAGNOSIS — R590 Localized enlarged lymph nodes: Secondary | ICD-10-CM

## 2024-04-30 MED ORDER — GADOBUTROL 1 MMOL/ML IV SOLN
7.5000 mL | Freq: Once | INTRAVENOUS | Status: AC | PRN
  Administered 2024-04-30: 7.5 mL via INTRAVENOUS

## 2024-05-04 NOTE — Progress Notes (Signed)
 Patient for US  guided core RT LN biopsy on Mon 05/07/24, I called and spoke with the patient's caretaker, Medford on the phone and gave pre-procedure instructions. Medford was made aware to have the patient here at 12:30p and check in at the [facility]. Medford stated understanding. Called 05/04/24

## 2024-05-07 ENCOUNTER — Ambulatory Visit: Admission: RE | Admit: 2024-05-07 | Discharge: 2024-05-07 | Attending: Otolaryngology | Admitting: Otolaryngology

## 2024-05-07 DIAGNOSIS — R590 Localized enlarged lymph nodes: Secondary | ICD-10-CM | POA: Diagnosis present

## 2024-05-07 DIAGNOSIS — C770 Secondary and unspecified malignant neoplasm of lymph nodes of head, face and neck: Secondary | ICD-10-CM | POA: Diagnosis not present

## 2024-05-07 DIAGNOSIS — R599 Enlarged lymph nodes, unspecified: Secondary | ICD-10-CM

## 2024-05-07 MED ORDER — LIDOCAINE HCL (PF) 1 % IJ SOLN
10.0000 mL | Freq: Once | INTRAMUSCULAR | Status: AC
  Administered 2024-05-07: 13:00:00 10 mL via INTRADERMAL
  Filled 2024-05-07: qty 10

## 2024-05-07 NOTE — Procedures (Signed)
 Interventional Radiology Procedure Note  Procedure: US  guided core biopsy of RIGHT neck lymph node  Complications: None  Estimated Blood Loss: None  Recommendations: - DC home   Signed,  Wilkie LOIS Lent, MD

## 2024-05-09 ENCOUNTER — Other Ambulatory Visit: Payer: Self-pay | Admitting: Otolaryngology

## 2024-05-09 DIAGNOSIS — C770 Secondary and unspecified malignant neoplasm of lymph nodes of head, face and neck: Secondary | ICD-10-CM

## 2024-05-09 DIAGNOSIS — C01 Malignant neoplasm of base of tongue: Secondary | ICD-10-CM

## 2024-05-09 LAB — SURGICAL PATHOLOGY

## 2024-05-10 ENCOUNTER — Encounter: Payer: Self-pay | Admitting: Internal Medicine

## 2024-05-16 ENCOUNTER — Ambulatory Visit
Admission: RE | Admit: 2024-05-16 | Discharge: 2024-05-16 | Disposition: A | Discharge status: Home / Self Care | Source: Ambulatory Visit | Attending: Otolaryngology | Admitting: Otolaryngology

## 2024-05-16 DIAGNOSIS — I700 Atherosclerosis of aorta: Secondary | ICD-10-CM | POA: Insufficient documentation

## 2024-05-16 DIAGNOSIS — N200 Calculus of kidney: Secondary | ICD-10-CM | POA: Insufficient documentation

## 2024-05-16 DIAGNOSIS — I517 Cardiomegaly: Secondary | ICD-10-CM | POA: Diagnosis not present

## 2024-05-16 DIAGNOSIS — K5730 Diverticulosis of large intestine without perforation or abscess without bleeding: Secondary | ICD-10-CM | POA: Insufficient documentation

## 2024-05-16 DIAGNOSIS — N400 Enlarged prostate without lower urinary tract symptoms: Secondary | ICD-10-CM | POA: Diagnosis not present

## 2024-05-16 DIAGNOSIS — C770 Secondary and unspecified malignant neoplasm of lymph nodes of head, face and neck: Secondary | ICD-10-CM | POA: Diagnosis not present

## 2024-05-16 DIAGNOSIS — C01 Malignant neoplasm of base of tongue: Secondary | ICD-10-CM | POA: Insufficient documentation

## 2024-05-16 LAB — GLUCOSE, CAPILLARY: Glucose-Capillary: 137 mg/dL — ABNORMAL HIGH (ref 70–99)

## 2024-05-16 MED ORDER — FLUDEOXYGLUCOSE F - 18 (FDG) INJECTION
10.2000 | Freq: Once | INTRAVENOUS | Status: AC | PRN
  Administered 2024-05-16: 8.36 via INTRAVENOUS

## 2024-05-22 ENCOUNTER — Inpatient Hospital Stay

## 2024-05-22 ENCOUNTER — Inpatient Hospital Stay: Attending: Internal Medicine | Admitting: Internal Medicine

## 2024-05-22 ENCOUNTER — Encounter: Payer: Self-pay | Admitting: Internal Medicine

## 2024-05-22 VITALS — BP 110/75 | HR 66 | Temp 97.8°F | Resp 18 | Ht 66.0 in | Wt 154.7 lb

## 2024-05-22 DIAGNOSIS — E785 Hyperlipidemia, unspecified: Secondary | ICD-10-CM

## 2024-05-22 DIAGNOSIS — Z801 Family history of malignant neoplasm of trachea, bronchus and lung: Secondary | ICD-10-CM | POA: Insufficient documentation

## 2024-05-22 DIAGNOSIS — C099 Malignant neoplasm of tonsil, unspecified: Secondary | ICD-10-CM | POA: Insufficient documentation

## 2024-05-22 DIAGNOSIS — E119 Type 2 diabetes mellitus without complications: Secondary | ICD-10-CM | POA: Insufficient documentation

## 2024-05-22 DIAGNOSIS — Z79899 Other long term (current) drug therapy: Secondary | ICD-10-CM | POA: Insufficient documentation

## 2024-05-22 DIAGNOSIS — H548 Legal blindness, as defined in USA: Secondary | ICD-10-CM | POA: Diagnosis not present

## 2024-05-22 DIAGNOSIS — C770 Secondary and unspecified malignant neoplasm of lymph nodes of head, face and neck: Secondary | ICD-10-CM | POA: Diagnosis not present

## 2024-05-22 DIAGNOSIS — C01 Malignant neoplasm of base of tongue: Secondary | ICD-10-CM | POA: Insufficient documentation

## 2024-05-22 DIAGNOSIS — R296 Repeated falls: Secondary | ICD-10-CM | POA: Insufficient documentation

## 2024-05-22 DIAGNOSIS — G20A1 Parkinson's disease without dyskinesia, without mention of fluctuations: Secondary | ICD-10-CM | POA: Diagnosis not present

## 2024-05-22 DIAGNOSIS — Z87891 Personal history of nicotine dependence: Secondary | ICD-10-CM | POA: Diagnosis not present

## 2024-05-22 DIAGNOSIS — R1310 Dysphagia, unspecified: Secondary | ICD-10-CM | POA: Insufficient documentation

## 2024-05-22 LAB — CBC WITH DIFFERENTIAL (CANCER CENTER ONLY)
Abs Immature Granulocytes: 0.04 K/uL (ref 0.00–0.07)
Basophils Absolute: 0 K/uL (ref 0.0–0.1)
Basophils Relative: 0 %
Eosinophils Absolute: 0.2 K/uL (ref 0.0–0.5)
Eosinophils Relative: 3 %
HCT: 39.8 % (ref 39.0–52.0)
Hemoglobin: 13 g/dL (ref 13.0–17.0)
Immature Granulocytes: 1 %
Lymphocytes Relative: 25 %
Lymphs Abs: 1.7 K/uL (ref 0.7–4.0)
MCH: 31.6 pg (ref 26.0–34.0)
MCHC: 32.7 g/dL (ref 30.0–36.0)
MCV: 96.6 fL (ref 80.0–100.0)
Monocytes Absolute: 0.5 K/uL (ref 0.1–1.0)
Monocytes Relative: 7 %
Neutro Abs: 4.3 K/uL (ref 1.7–7.7)
Neutrophils Relative %: 64 %
Platelet Count: 216 K/uL (ref 150–400)
RBC: 4.12 MIL/uL — ABNORMAL LOW (ref 4.22–5.81)
RDW: 13.7 % (ref 11.5–15.5)
WBC Count: 6.8 K/uL (ref 4.0–10.5)
nRBC: 0 % (ref 0.0–0.2)

## 2024-05-22 LAB — CMP (CANCER CENTER ONLY)
ALT: <5 U/L (ref 0–44)
AST: 27 U/L (ref 15–41)
Albumin: 4 g/dL (ref 3.5–5.0)
Alkaline Phosphatase: 71 U/L (ref 38–126)
Anion gap: 9 (ref 5–15)
BUN: 27 mg/dL — ABNORMAL HIGH (ref 8–23)
CO2: 29 mmol/L (ref 22–32)
Calcium: 9.4 mg/dL (ref 8.9–10.3)
Chloride: 102 mmol/L (ref 98–111)
Creatinine: 0.97 mg/dL (ref 0.61–1.24)
GFR, Estimated: >60 mL/min
Glucose, Bld: 114 mg/dL — ABNORMAL HIGH (ref 70–99)
Potassium: 4.3 mmol/L (ref 3.5–5.1)
Sodium: 140 mmol/L (ref 135–145)
Total Bilirubin: 0.3 mg/dL (ref 0.0–1.2)
Total Protein: 6.9 g/dL (ref 6.5–8.1)

## 2024-05-22 LAB — MAGNESIUM: Magnesium: 1.8 mg/dL (ref 1.7–2.4)

## 2024-05-22 LAB — LIPID PANEL
Cholesterol: 150 mg/dL (ref 0–200)
HDL: 46 mg/dL
LDL Cholesterol: 80 mg/dL (ref 0–99)
Total CHOL/HDL Ratio: 3.3 ratio
Triglycerides: 121 mg/dL
VLDL: 24 mg/dL (ref 0–40)

## 2024-05-22 LAB — HEMOGLOBIN A1C
Hgb A1c MFr Bld: 7.4 % — ABNORMAL HIGH (ref 4.8–5.6)
Mean Plasma Glucose: 165.68 mg/dL
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
## 2024-05-22 NOTE — Progress Notes (Signed)
 C/o headaches everyday over his eyes. Uses Excedrin and ice packs. Headaches come back in the evening x1  month.  He states he has new lumps on the sides of his neck/ear area. The lump under his chin has become hard.

## 2024-05-22 NOTE — Assessment & Plan Note (Addendum)
#   PET DEC, 23rd, 2025- [Dr.McQueen]- Hypermetabolic uptake in the inferior oropharynx along the left floor of mouth with extension up into the posterior left nasopharyngeal region. Imaging features compatible with known primary neoplasm;  Hypermetabolic bilateral level II and left level III cervical lymph nodes compatible with metastatic disease. No evidence for hypermetabolic metastatic disease in the chest, abdomen, or pelvis.  DEC 2025- Lymph node, biopsy, right, 18 G cores of cervical lymph node :  METASTATIC SQUAMOUS CELL CARCINOMA, MODERATELY TO POORLY DIFFERENTIATED WITH   FOCAL MINIMAL KERATINIZATION.SABRA P16 IS POSITIVE, AND EBER ISH IS NEGATIVE.  [id number] -stage III  # I reviewed with the patient the option of definitive chemotherapy and radiation therapy concurrently.  Usually the treatments last 6-8 week [with weekly chemotherapy and radiation therapy offered Monday through Friday.  I discussed that surgery would be usually reserved for residual/recurrent malignancy. Discussed that would recommend reimaging with a PET scan 8 to 12 weeks post chemoradiation.  Given p16 positivity-patient will likely will get a good response from chemoradiation.  I discussed that the treatments are fairly intensive; but the goal would be cure. Radiation oncology referral.  Will discussed at tumor conference. Discussed with the patient that we will plan to coordinate his treatment along with start of radiation therapy.  # With regards to choice of systemic therapy-options include cetuximab based infusion [with risk of rash; also and risk of anaphylactic reaction-Will check alpha gal]; other option include carboplatin plus minus Taxol [given the risk of neuropathy].  Patient is a poor candidate for cisplatin based therapy.   # [place] solian with cane; Dr.Shah]- Hx of falls- On lamictal- for mood swings-monitor neuropathy and tremors closely given the risk of falls.  # Given the risk of malnutrition/from  mucositis-recommend prophylactic PEG tube placement.  Discussed with Dr. Tommy make a referral.  Will also make a referral to speech pathology/nutrition.   #Incidental findings on Imaging  PET- CT , 2025: 4. Bilateral nonobstructing nephrolithiasis. Left colonic diverticulosis without diverticulitis; Marked prostatomegaly;  Aortic Atherosclerosis (ICD10-I70.0).I reviewed/discussed/counseled the patient.   # Given the locally advanced disease-in all comorbidities recommend evaluation with [name] at next visit.   Thank you Dr.Baboff MD for allowing me to participate in the care of your pleasant patient. Please do not hesitate to contact me with questions or concerns in the interim.  # DISPOSITION: # chemo education [carbo-Taxol] ASAP # Speech pathology- Happy Rubbie- re: head and neck cancer- # port placement /PEG tube- ASAP- referral to  IR  # Referral to Dr.Crystal re: head and neck cancer-  ASAP # labs today- please order  cbc/cmp/mag/ lipid panel Hb A1c; urine microalbumin-  [I ordered alpha gal] # referral to [facility]- re: palliative care # follow up TBD- Dr.B-   # 60 minutes face-to-face with the patient discussing the above plan of care; more than 50% of time spent on prognosis/ natural history; counseling and coordination.  # I reviewed the blood work- with the patient in detail; also reviewed the imaging independently [as summarized above]; and with the patient in detail.

## 2024-05-23 ENCOUNTER — Other Ambulatory Visit: Payer: Self-pay | Admitting: Internal Medicine

## 2024-05-23 ENCOUNTER — Other Ambulatory Visit: Payer: Self-pay | Admitting: *Deleted

## 2024-05-23 ENCOUNTER — Telehealth: Payer: Self-pay | Admitting: *Deleted

## 2024-05-23 DIAGNOSIS — R1312 Dysphagia, oropharyngeal phase: Secondary | ICD-10-CM

## 2024-05-23 DIAGNOSIS — C099 Malignant neoplasm of tonsil, unspecified: Secondary | ICD-10-CM

## 2024-05-23 LAB — MICROALBUMIN, URINE: Microalb, Ur: 3.4 ug/mL — ABNORMAL HIGH

## 2024-05-23 NOTE — Telephone Encounter (Signed)
 Faxed referral to [facility], Dr Cintron Diaz for Peg tube placement and port placement this morning.

## 2024-05-24 LAB — MISC LABCORP TEST (SEND OUT): Labcorp test code: 650003

## 2024-05-25 ENCOUNTER — Ambulatory Visit
Admission: RE | Admit: 2024-05-25 | Discharge: 2024-05-25 | Disposition: A | Source: Ambulatory Visit | Attending: Internal Medicine

## 2024-05-25 DIAGNOSIS — C099 Malignant neoplasm of tonsil, unspecified: Secondary | ICD-10-CM | POA: Diagnosis present

## 2024-05-25 DIAGNOSIS — R1312 Dysphagia, oropharyngeal phase: Secondary | ICD-10-CM | POA: Diagnosis present

## 2024-05-25 NOTE — H&P (View-Only) (Signed)
 " History of Present Illness Bradley [name] is an 89 year old male with recently diagnosed tonsillar malignancy presenting for evaluation for port-a-cath and gastrostomy tube placement.  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 Since diagnosis, he has had persistent odynophagia and dysphagia. A recent swallow study was normal without evidence of aspiration. He remains able to eat with caregiver assistance, requiring food to be cut into smaller pieces. No dietary modifications have been necessary to date.  He has not yet initiated chemotherapy or radiation therapy, but anticipates both treatments will begin soon. Placement of a gastrostomy tube is being considered preemptively due to expected worsening dysphagia with chemoradiation. He is not currently using a feeding tube and continues oral intake.  Port-a-cath placement is planned to facilitate chemotherapy administration and avoid repeated venipuncture, as his veins are described as poor. Both procedures are scheduled to be performed under anesthesia in the operating room during the same session.  He has significant tremor due to Parkinson's disease, which impairs his ability to feed himself and necessitates caregiver assistance. Cognitive impairment is also present, contributing to difficulty managing the complexity of his current medical care.      PAST MEDICAL HISTORY:  Past Medical History:  Diagnosis Date   Arthritis    Chronic obstructive pulmonary disease (CMS/HHS-HCC) 09/26/2023   Dementia (CMS-HCC)    Diabetes mellitus type 2, uncomplicated (CMS/HHS-HCC)    GERD (gastroesophageal reflux disease)    Glaucoma (increased eye pressure)    History of stroke Unknown mri showed   Hyperlipidemia    Hypertension    Macular degeneration    Multiple gastric ulcers    Parkinson disease (CMS/HHS-HCC) 12/2016   Sleep apnea 01/2015   Now have cpac machine   Tremor         PAST SURGICAL HISTORY:    Past Surgical History:  Procedure Laterality Date   JOINT REPLACEMENT  05/2004   Rich knee replacement   EGD @ [facility]  11/10/2022   (Inpatient) Inpatient EGD with ulcers. Biopsies negative for H pylori. Repeat EGD prn/CTL   BLEPHAROPLASTY     CATARACT EXTRACTION     CHOLECYSTECTOMY     Kidney Stone Removal  1983, 1987, 1990   KNEE ARTHROSCOPY           MEDICATIONS:  Outpatient Encounter Medications as of 05/25/2024  Medication Sig Dispense Refill   acetaminophen  (TYLENOL ) 500 MG tablet Take 500 mg by mouth every 6 (six) hours as needed for Pain.     albuterol  MDI, PROVENTIL , VENTOLIN , PROAIR , HFA 90 mcg/actuation inhaler USE 2 INHALATIONS BY MOUTH EVERY 6 HOURS AS NEEDED FOR WHEEZING 54 g 2   ANTIOX #8/OM3/DHA/EPA/LUT/ZEAX (PRESERVISION AREDS 2, OMEGA-3, ORAL) Take by mouth.     aspirin  81 MG EC tablet Take 81 mg by mouth once daily 30 tablet 11   atorvastatin  (LIPITOR) 20 MG tablet TAKE 1 TABLET BY MOUTH ONCE  DAILY 90 tablet 3   BIOTIN ORAL Take by mouth Twice a day     brimonidine -timolol  (COMBIGAN ) 0.2-0.5 % ophthalmic solution 1 drop 2 (two) times daily.     carbidopa -levodopa  (SINEMET  CR) 50-200 mg CR tablet Take 1 tablet by mouth at bedtime 90 tablet 3   cholecalciferol  (VITAMIN D3) 1000 unit tablet Take by mouth     docusate (COLACE) 100 MG capsule Take 100 mg by mouth 2 (two) times daily     finasteride  (PROSCAR ) 5 mg tablet TAKE 1 TABLET BY MOUTH ONCE  DAILY 90 tablet 3  lamoTRIgine (LAMICTAL) 25 MG tablet Take 1 tablet (25 mg total) by mouth every 12 (twelve) hours 180 tablet 3   melatonin 5 mg Cap Take 10 mg by mouth nightly        metFORMIN (GLUCOPHAGE-XR) 500 MG XR tablet Take 1 tablet (500 mg total) by mouth daily with dinner 90 tablet 3   pantoprazole  (PROTONIX ) 40 MG DR tablet Take 1 tablet (40 mg total) by mouth once daily Am 90 tablet 3   rOPINIRole  (REQUIP ) 0.5 MG tablet Take 1 tablet (0.5 mg total) by mouth 4 (four) times daily 360 tablet 3    sertraline  (ZOLOFT ) 100 MG tablet TAKE 1 TABLET BY MOUTH ONCE  DAILY 90 tablet 3   TORsemide (DEMADEX) 20 MG tablet Take 1 tablet (20 mg total) by mouth every Monday, Wednesday, and Friday (Patient taking differently: Take 10 mg by mouth as directed (edema) Monday- Saturday) 36 tablet 3   buPROPion  (WELLBUTRIN  XL) 150 MG XL tablet Take 1 tablet (150 mg total) by mouth every morning 90 tablet 3   carbidopa -levodopa  (SINEMET ) 25-100 mg tablet Take 2 tablets by mouth 4 (four) times daily for 180 days (Patient not taking: Reported on 05/25/2024) 720 tablet 1   ipratropium (ATROVENT ) 0.06 % nasal spray USE 2 SPRAYS IN BOTH NOSTRILS  TWICE DAILY AS NEEDED FOR  RHINITIS 75 mL 3   methocarbamoL (ROBAXIN) 500 MG tablet Take 1 tablet (500 mg total) by mouth 2 (two) times daily as needed for up to 360 days (Patient not taking: Reported on 05/25/2024) 30 tablet 2   polyethylene glycol (MIRALAX ) powder Take 17 g by mouth once daily Mix in 4-8ounces of fluid prior to taking.     No facility-administered encounter medications on file as of 05/25/2024.     ALLERGIES:   Penicillin, Sulfa (sulfonamide antibiotics), and Sulfasalazine   SOCIAL HISTORY:  Social History   Socioeconomic History   Marital status: Married  Tobacco Use   Smoking status: Former    Current packs/day: 0.00    Types: Cigarettes    Quit date: 11/20/1998    Years since quitting: 25.5   Smokeless tobacco: Never  Vaping Use   Vaping status: Never Used  Substance and Sexual Activity   Alcohol use: Yes    Alcohol/week: 4.0 standard drinks of alcohol    Types: 2 Cans of beer, 2 Shots of liquor per week   Drug use: No   Sexual activity: Not Currently    Partners: Female    Birth control/protection: Surgical, Other-see comments   Social Drivers of Health   Financial Resource Strain: Low Risk  (11/15/2023)   Overall Financial Resource Strain (CARDIA)    Difficulty of Paying Living Expenses: Not hard at all  Food  Insecurity: No Food Insecurity (05/22/2024)   Received from [facility]   Hunger Vital Sign    Within the past 12 months, you worried that your food would run out before you got the money to buy more.: Never true    Within the past 12 months, the food you bought just didn't last and you didn't have money to get more.: Never true  Transportation Needs: No Transportation Needs (05/22/2024)   Received from [facility] - Transportation    In the past 12 months, has lack of transportation kept you from medical appointments or from getting medications?: No    In the past 12 months, has lack of transportation kept you from meetings, work, or from getting things needed  for daily living?: No    FAMILY HISTORY:  Family History  Problem Relation Name Age of Onset   Cancer Sister Mardeen        Lung   Hyperlipidemia (Elevated cholesterol) Sister Esther    High blood pressure (Hypertension) Sister Esther    Parkinsonism Sister Mardeen    Tremor Sister Mardeen    Dementia Sister Mardeen    Diabetes Sister Mardeen    Cancer Sister Mercer        Lung   Hyperlipidemia (Elevated cholesterol) Sister Mercer    High blood pressure (Hypertension) Sister Mercer    Dementia Sister Mercer    Parkinsonism Sister Mercer    Tremor Sister Mercer    Myocardial Infarction (Heart attack) Father Prentice    High blood pressure (Hypertension) Father Prentice    Stroke Father Fred    Coronary Artery Disease (Blocked arteries around heart) Father Fred    Myocardial Infarction (Heart attack) Mother Molly    Hyperlipidemia (Elevated cholesterol) Mother Molly    High blood pressure (Hypertension) Mother Geofm    Coronary Artery Disease (Blocked arteries around heart) Mother Geofm    Dementia Mother Geofm    Alzheimer's disease Mother Molly    Myocardial Infarction (Heart attack) Brother Ed    Hyperlipidemia (Elevated cholesterol) Brother Ed    High blood pressure (Hypertension) Brother  Ed    Parkinsonism Brother Ed      GENERAL REVIEW OF SYSTEMS:   General ROS: negative for - chills, fatigue, fever, weight gain or weight loss Allergy and Immunology ROS: negative for - hives  Hematological and Lymphatic ROS: negative for - bleeding problems or bruising, negative for palpable nodes Endocrine ROS: negative for - heat or cold intolerance, hair changes Respiratory ROS: negative for - cough, shortness of breath or wheezing Cardiovascular ROS: no chest pain or palpitations GI ROS: negative for nausea, vomiting, abdominal pain, diarrhea, constipation Musculoskeletal ROS: negative for - joint swelling or muscle pain Neurological ROS: negative for - confusion, syncope Dermatological ROS: negative for pruritus and rash  PHYSICAL EXAM:  Vitals:   05/25/24 1124  BP: 110/65  Pulse: 63  .  Ht:167.6 cm (5' 6) Wt:73 kg (161 lb) ADJ:Anib surface area is 1.84 meters squared. Body mass index is 25.99 kg/m.SABRA   GENERAL: Alert, active, oriented x3  HEENT: Pupils equal reactive to light. Extraocular movements are intact. Sclera clear. Palpebral conjunctiva normal red color.Pharynx clear.  NECK: Supple with no palpable mass and no adenopathy.  LUNGS: Sound clear with no rales rhonchi or wheezes.  HEART: Regular rhythm S1 and S2 without murmur.  ABDOMEN: Soft and depressible, nontender with no palpable mass, no hepatomegaly.   EXTREMITIES: Well-developed well-nourished symmetrical with no dependent edema.  NEUROLOGICAL: Awake alert oriented, facial expression symmetrical, moving all extremities.   Results Diagnostic Swallow study (05/22/2024): No aspiration; normal deglutition    Assessment & Plan Malignant neoplasm of tonsil He has a recent diagnosis of tonsillar malignancy and requires chemoradiation, which is expected to worsen dysphagia. Port-a-cath placement is indicated to facilitate chemotherapy and reduce venous access complications. Gastrostomy tube placement is  planned preemptively to maintain nutritional support during anticipated dysphagia. Both procedures are scheduled together under anesthesia next Friday to minimize procedural risk and recovery time. Risks discussed include infection, skin irritation, and procedural recovery, with anticipated benefits of improved access for cancer therapy and nutritional maintenance. Postoperative care includes using a binder to secure the gastrostomy tube, with tape as an alternative if the binder is not  tolerated. He should avoid heavy lifting for 1-2 weeks postoperatively, with recovery to baseline activity expected within 1-2 weeks. A follow-up is planned in 1-2 weeks to assess recovery and readiness for cancer treatment. Coordination with oncology regarding the timing of chemotherapy and radiation initiation is ongoing, and any schedule changes will be communicated to him.   Cancer of head and neck (CMS/HHS-HCC) [C76.0]          Patient and his wife verbalized understanding, all questions were answered, and were agreeable with the plan outlined above.   I spent a total of 65 minutes in both face-to-face and non-face-to-face activities, excluding procedures performed, for this visit on the date of this encounter.   Lucas Sjogren, MD  Electronically signed by Lucas Sjogren, MD "

## 2024-05-25 NOTE — Procedures (Signed)
 Modified Barium Swallow Study  Patient Details  Name: ARYON NHAM MRN: [id number] Date of Birth: 1936-04-21  Today's Date: 05/25/2024  Modified Barium Swallow completed.  Full report located under Chart Review in the Imaging Section.  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 Since detection, the left neck mass has increased in size, and a new contralateral neck mass developed over the past 1-2 days. He describes the left neck mass as feeling like a rubber ball. Relevant comorbidities include Parkinson's disease with progressive hand and jaw tremors, difficulty with fine motor tasks, cognitive decline, and finger numbness. He is legally blind (vision 20/2000) but remains independent in activities of daily living with assistive devices. He has type 2 diabetes and a remote tobacco use history, having quit in 2000 after a 30-year history.       Per pt and his wife's rpeort, he is currently seeing a speech therapist at [facility] twice a week for cognition. Per chart he also participated in [facility] (01/08/2019 thru 02/12/2019).       CT Soft Tissue Neck 04/13/2024  There is an irregular soft tissue mass involving the left base of tongue with  effacement of the vallecula and extension along the lingual epiglottic surface.  There is extension deep into the floor of mouth, more pronounced on the left  though also involving the right. In total, the irregular soft tissue mass  measures approximately 4.8 x 4.3 x 4.7 cm (AP x TR x CC) and involves the left  lateral pharyngeal sidewall. There is extension to involve the contralateral right base of tongue region as well. There are heterogeneous, pathologic enlarged bilateral level 2 lymph nodes measuring up to 2.5 x 3.0 cm on the right and 2.4 x 2.1 cm on the left,suspicious for  metastatic disease. There is a pathologic left lateral retropharyngeal lymph node. These demonstrate regions of central hypodensity likely representing necrosis. There are additional prominent pathological left level 2 and left level 3 lymph nodes.   MRI 04/30/2024 Multiple chronic microhemorrhages in the cerebral hemispheres, right greater than left, increased from prior study, with distribution concerning for cerebral amyloid angiopathy. 2. Mild to moderate generalized parenchymal volume loss, slightly increased since 2018. 3. Moderate chronic microvascular ischemic changes, slightly increased since 2018. 4. 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   PET Scan 05/16/2024 Hypermetabolic uptake in the inferior oropharynx along the left floor of mouth is hypermetabolic with SUV max = 11.7. Small focus of hypermetabolism extends up into the posterior left nasopharyngeal region with SUV max = 4.7.   Right-sided level II lymph node measures 2.9 x 2.0 cm today with SUV max = 10.0. Left-sided level II and III lymph nodes demonstrate SUV max up to 10.3.   Incidental CT findings: Irregular soft tissue in the left oropharynx and floor mouth was better evaluated on previous neck CT with contrast material.   Clinical Impression  Pt presents with overall adequate oropharyngeal abilities when consuming thin liquids, nectar thick liquids, puree, graham cracker with barium paste and whole barium tablet with thin liquids. When consuming puree and soft solids, pt was observed grimacing with report of increased pain to 8 out of 10 therefore longer more deliberate handling of the boluses orally was observed. No aspiration or penetration was observed.   Extensive education provided on the results of this study, aspiration precautions,  diet recommendation as well as Outpatient ST services targeting perseveration of swallow function during radiation. All questions were answered to pt and his wife's  satisfaction.      Factors that may increase risk of adverse event in presence of aspiration Noe & Lianne 2021): Reduced cognitive function (upcoming radiation of HNC cancer, pain with solids, tumor presence)  Swallow Evaluation Recommendations Recommendations: PO diet PO Diet Recommendation: Regular;Thin liquids (Level 0) Liquid Administration via: Cup;Straw Medication Administration: Whole meds with liquid Supervision: Patient able to self-feed Swallowing strategies  : Minimize environmental distractions;Slow rate;Small bites/sips Postural changes: Position pt fully upright for meals;Stay upright 30-60 min after meals Oral care recommendations: Oral care BID (2x/day)    Simrah Chatham B. Rubbie, M.S., CCC-SLP, Tree Surgeon Certified Brain Injury Specialist [facility]  [facility] Office [phone] Ascom [phone] Fax [phone]

## 2024-05-25 NOTE — Progress Notes (Signed)
 " History of Present Illness Bradley [name] is an 89 year old male with recently diagnosed tonsillar malignancy presenting for evaluation for port-a-cath and gastrostomy tube placement.  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 Since diagnosis, he has had persistent odynophagia and dysphagia. A recent swallow study was normal without evidence of aspiration. He remains able to eat with caregiver assistance, requiring food to be cut into smaller pieces. No dietary modifications have been necessary to date.  He has not yet initiated chemotherapy or radiation therapy, but anticipates both treatments will begin soon. Placement of a gastrostomy tube is being considered preemptively due to expected worsening dysphagia with chemoradiation. He is not currently using a feeding tube and continues oral intake.  Port-a-cath placement is planned to facilitate chemotherapy administration and avoid repeated venipuncture, as his veins are described as poor. Both procedures are scheduled to be performed under anesthesia in the operating room during the same session.  He has significant tremor due to Parkinson's disease, which impairs his ability to feed himself and necessitates caregiver assistance. Cognitive impairment is also present, contributing to difficulty managing the complexity of his current medical care.      PAST MEDICAL HISTORY:  Past Medical History:  Diagnosis Date   Arthritis    Chronic obstructive pulmonary disease (CMS/HHS-HCC) 09/26/2023   Dementia (CMS-HCC)    Diabetes mellitus type 2, uncomplicated (CMS/HHS-HCC)    GERD (gastroesophageal reflux disease)    Glaucoma (increased eye pressure)    History of stroke Unknown mri showed   Hyperlipidemia    Hypertension    Macular degeneration    Multiple gastric ulcers    Parkinson disease (CMS/HHS-HCC) 12/2016   Sleep apnea 01/2015   Now have cpac machine   Tremor         PAST SURGICAL HISTORY:    Past Surgical History:  Procedure Laterality Date   JOINT REPLACEMENT  05/2004   Rich knee replacement   EGD @ [facility]  11/10/2022   (Inpatient) Inpatient EGD with ulcers. Biopsies negative for H pylori. Repeat EGD prn/CTL   BLEPHAROPLASTY     CATARACT EXTRACTION     CHOLECYSTECTOMY     Kidney Stone Removal  1983, 1987, 1990   KNEE ARTHROSCOPY           MEDICATIONS:  Outpatient Encounter Medications as of 05/25/2024  Medication Sig Dispense Refill   acetaminophen  (TYLENOL ) 500 MG tablet Take 500 mg by mouth every 6 (six) hours as needed for Pain.     albuterol  MDI, PROVENTIL , VENTOLIN , PROAIR , HFA 90 mcg/actuation inhaler USE 2 INHALATIONS BY MOUTH EVERY 6 HOURS AS NEEDED FOR WHEEZING 54 g 2   ANTIOX #8/OM3/DHA/EPA/LUT/ZEAX (PRESERVISION AREDS 2, OMEGA-3, ORAL) Take by mouth.     aspirin  81 MG EC tablet Take 81 mg by mouth once daily 30 tablet 11   atorvastatin  (LIPITOR) 20 MG tablet TAKE 1 TABLET BY MOUTH ONCE  DAILY 90 tablet 3   BIOTIN ORAL Take by mouth Twice a day     brimonidine -timolol  (COMBIGAN ) 0.2-0.5 % ophthalmic solution 1 drop 2 (two) times daily.     carbidopa -levodopa  (SINEMET  CR) 50-200 mg CR tablet Take 1 tablet by mouth at bedtime 90 tablet 3   cholecalciferol  (VITAMIN D3) 1000 unit tablet Take by mouth     docusate (COLACE) 100 MG capsule Take 100 mg by mouth 2 (two) times daily     finasteride  (PROSCAR ) 5 mg tablet TAKE 1 TABLET BY MOUTH ONCE  DAILY 90 tablet 3  lamoTRIgine (LAMICTAL) 25 MG tablet Take 1 tablet (25 mg total) by mouth every 12 (twelve) hours 180 tablet 3   melatonin 5 mg Cap Take 10 mg by mouth nightly        metFORMIN (GLUCOPHAGE-XR) 500 MG XR tablet Take 1 tablet (500 mg total) by mouth daily with dinner 90 tablet 3   pantoprazole  (PROTONIX ) 40 MG DR tablet Take 1 tablet (40 mg total) by mouth once daily Am 90 tablet 3   rOPINIRole  (REQUIP ) 0.5 MG tablet Take 1 tablet (0.5 mg total) by mouth 4 (four) times daily 360 tablet 3    sertraline  (ZOLOFT ) 100 MG tablet TAKE 1 TABLET BY MOUTH ONCE  DAILY 90 tablet 3   TORsemide (DEMADEX) 20 MG tablet Take 1 tablet (20 mg total) by mouth every Monday, Wednesday, and Friday (Patient taking differently: Take 10 mg by mouth as directed (edema) Monday- Saturday) 36 tablet 3   buPROPion  (WELLBUTRIN  XL) 150 MG XL tablet Take 1 tablet (150 mg total) by mouth every morning 90 tablet 3   carbidopa -levodopa  (SINEMET ) 25-100 mg tablet Take 2 tablets by mouth 4 (four) times daily for 180 days (Patient not taking: Reported on 05/25/2024) 720 tablet 1   ipratropium (ATROVENT ) 0.06 % nasal spray USE 2 SPRAYS IN BOTH NOSTRILS  TWICE DAILY AS NEEDED FOR  RHINITIS 75 mL 3   methocarbamoL (ROBAXIN) 500 MG tablet Take 1 tablet (500 mg total) by mouth 2 (two) times daily as needed for up to 360 days (Patient not taking: Reported on 05/25/2024) 30 tablet 2   polyethylene glycol (MIRALAX ) powder Take 17 g by mouth once daily Mix in 4-8ounces of fluid prior to taking.     No facility-administered encounter medications on file as of 05/25/2024.     ALLERGIES:   Penicillin, Sulfa (sulfonamide antibiotics), and Sulfasalazine   SOCIAL HISTORY:  Social History   Socioeconomic History   Marital status: Married  Tobacco Use   Smoking status: Former    Current packs/day: 0.00    Types: Cigarettes    Quit date: 11/20/1998    Years since quitting: 25.5   Smokeless tobacco: Never  Vaping Use   Vaping status: Never Used  Substance and Sexual Activity   Alcohol use: Yes    Alcohol/week: 4.0 standard drinks of alcohol    Types: 2 Cans of beer, 2 Shots of liquor per week   Drug use: No   Sexual activity: Not Currently    Partners: Female    Birth control/protection: Surgical, Other-see comments   Social Drivers of Health   Financial Resource Strain: Low Risk  (11/15/2023)   Overall Financial Resource Strain (CARDIA)    Difficulty of Paying Living Expenses: Not hard at all  Food  Insecurity: No Food Insecurity (05/22/2024)   Received from [facility]   Hunger Vital Sign    Within the past 12 months, you worried that your food would run out before you got the money to buy more.: Never true    Within the past 12 months, the food you bought just didn't last and you didn't have money to get more.: Never true  Transportation Needs: No Transportation Needs (05/22/2024)   Received from [facility] - Transportation    In the past 12 months, has lack of transportation kept you from medical appointments or from getting medications?: No    In the past 12 months, has lack of transportation kept you from meetings, work, or from getting things needed  for daily living?: No    FAMILY HISTORY:  Family History  Problem Relation Name Age of Onset   Cancer Sister Mardeen        Lung   Hyperlipidemia (Elevated cholesterol) Sister Esther    High blood pressure (Hypertension) Sister Esther    Parkinsonism Sister Mardeen    Tremor Sister Mardeen    Dementia Sister Mardeen    Diabetes Sister Mardeen    Cancer Sister Mercer        Lung   Hyperlipidemia (Elevated cholesterol) Sister Mercer    High blood pressure (Hypertension) Sister Mercer    Dementia Sister Mercer    Parkinsonism Sister Mercer    Tremor Sister Mercer    Myocardial Infarction (Heart attack) Father Prentice    High blood pressure (Hypertension) Father Prentice    Stroke Father Fred    Coronary Artery Disease (Blocked arteries around heart) Father Fred    Myocardial Infarction (Heart attack) Mother Molly    Hyperlipidemia (Elevated cholesterol) Mother Molly    High blood pressure (Hypertension) Mother Geofm    Coronary Artery Disease (Blocked arteries around heart) Mother Geofm    Dementia Mother Geofm    Alzheimer's disease Mother Molly    Myocardial Infarction (Heart attack) Brother Ed    Hyperlipidemia (Elevated cholesterol) Brother Ed    High blood pressure (Hypertension) Brother  Ed    Parkinsonism Brother Ed      GENERAL REVIEW OF SYSTEMS:   General ROS: negative for - chills, fatigue, fever, weight gain or weight loss Allergy and Immunology ROS: negative for - hives  Hematological and Lymphatic ROS: negative for - bleeding problems or bruising, negative for palpable nodes Endocrine ROS: negative for - heat or cold intolerance, hair changes Respiratory ROS: negative for - cough, shortness of breath or wheezing Cardiovascular ROS: no chest pain or palpitations GI ROS: negative for nausea, vomiting, abdominal pain, diarrhea, constipation Musculoskeletal ROS: negative for - joint swelling or muscle pain Neurological ROS: negative for - confusion, syncope Dermatological ROS: negative for pruritus and rash  PHYSICAL EXAM:  Vitals:   05/25/24 1124  BP: 110/65  Pulse: 63  .  Ht:167.6 cm (5' 6) Wt:73 kg (161 lb) ADJ:Anib surface area is 1.84 meters squared. Body mass index is 25.99 kg/m.SABRA   GENERAL: Alert, active, oriented x3  HEENT: Pupils equal reactive to light. Extraocular movements are intact. Sclera clear. Palpebral conjunctiva normal red color.Pharynx clear.  NECK: Supple with no palpable mass and no adenopathy.  LUNGS: Sound clear with no rales rhonchi or wheezes.  HEART: Regular rhythm S1 and S2 without murmur.  ABDOMEN: Soft and depressible, nontender with no palpable mass, no hepatomegaly.   EXTREMITIES: Well-developed well-nourished symmetrical with no dependent edema.  NEUROLOGICAL: Awake alert oriented, facial expression symmetrical, moving all extremities.   Results Diagnostic Swallow study (05/22/2024): No aspiration; normal deglutition    Assessment & Plan Malignant neoplasm of tonsil He has a recent diagnosis of tonsillar malignancy and requires chemoradiation, which is expected to worsen dysphagia. Port-a-cath placement is indicated to facilitate chemotherapy and reduce venous access complications. Gastrostomy tube placement is  planned preemptively to maintain nutritional support during anticipated dysphagia. Both procedures are scheduled together under anesthesia next Friday to minimize procedural risk and recovery time. Risks discussed include infection, skin irritation, and procedural recovery, with anticipated benefits of improved access for cancer therapy and nutritional maintenance. Postoperative care includes using a binder to secure the gastrostomy tube, with tape as an alternative if the binder is not  tolerated. He should avoid heavy lifting for 1-2 weeks postoperatively, with recovery to baseline activity expected within 1-2 weeks. A follow-up is planned in 1-2 weeks to assess recovery and readiness for cancer treatment. Coordination with oncology regarding the timing of chemotherapy and radiation initiation is ongoing, and any schedule changes will be communicated to him.   Cancer of head and neck (CMS/HHS-HCC) [C76.0]          Patient and his wife verbalized understanding, all questions were answered, and were agreeable with the plan outlined above.   I spent a total of 65 minutes in both face-to-face and non-face-to-face activities, excluding procedures performed, for this visit on the date of this encounter.   Lucas Sjogren, MD  Electronically signed by Lucas Sjogren, MD "

## 2024-05-28 ENCOUNTER — Encounter: Payer: Self-pay | Admitting: Hospice and Palliative Medicine

## 2024-05-28 ENCOUNTER — Inpatient Hospital Stay: Attending: Internal Medicine

## 2024-05-28 ENCOUNTER — Inpatient Hospital Stay (HOSPITAL_BASED_OUTPATIENT_CLINIC_OR_DEPARTMENT_OTHER): Admitting: Hospice and Palliative Medicine

## 2024-05-28 VITALS — BP 111/72 | HR 62 | Temp 97.5°F | Ht 66.0 in | Wt 156.0 lb

## 2024-05-28 DIAGNOSIS — C770 Secondary and unspecified malignant neoplasm of lymph nodes of head, face and neck: Secondary | ICD-10-CM | POA: Insufficient documentation

## 2024-05-28 DIAGNOSIS — Z515 Encounter for palliative care: Secondary | ICD-10-CM | POA: Diagnosis not present

## 2024-05-28 DIAGNOSIS — C099 Malignant neoplasm of tonsil, unspecified: Secondary | ICD-10-CM | POA: Diagnosis not present

## 2024-05-28 DIAGNOSIS — Z79899 Other long term (current) drug therapy: Secondary | ICD-10-CM | POA: Insufficient documentation

## 2024-05-28 DIAGNOSIS — Z5111 Encounter for antineoplastic chemotherapy: Secondary | ICD-10-CM | POA: Insufficient documentation

## 2024-05-28 DIAGNOSIS — Z87891 Personal history of nicotine dependence: Secondary | ICD-10-CM | POA: Insufficient documentation

## 2024-05-28 DIAGNOSIS — E871 Hypo-osmolality and hyponatremia: Secondary | ICD-10-CM | POA: Insufficient documentation

## 2024-05-28 DIAGNOSIS — G893 Neoplasm related pain (acute) (chronic): Secondary | ICD-10-CM | POA: Insufficient documentation

## 2024-05-28 DIAGNOSIS — Z66 Do not resuscitate: Secondary | ICD-10-CM | POA: Insufficient documentation

## 2024-05-28 MED ORDER — ONDANSETRON HCL 8 MG PO TABS: 8.0000 mg | ORAL_TABLET | Freq: Three times a day (TID) | ORAL | 0 refills | Status: AC | PRN

## 2024-05-28 MED ORDER — LIDOCAINE-PRILOCAINE 2.5-2.5 % EX CREA: 1.0000 | TOPICAL_CREAM | CUTANEOUS | 0 refills | Status: AC | PRN

## 2024-05-28 MED ORDER — PROCHLORPERAZINE MALEATE 10 MG PO TABS: 10.0000 mg | ORAL_TABLET | Freq: Four times a day (QID) | ORAL | 0 refills | Status: AC | PRN

## 2024-05-28 MED ORDER — HYDROCODONE-ACETAMINOPHEN 5-325 MG PO TABS: 1.0000 | ORAL_TABLET | Freq: Four times a day (QID) | ORAL | 0 refills | Status: DC | PRN

## 2024-05-28 NOTE — Progress Notes (Signed)
 "    Palliative Medicine [facility] at [facility] Telephone:([phone]) [phone] Fax:(336) [phone]   Name: Bradley [name] Date: 05/28/2024 MRN: [id number]  DOB: 09-03-1935  Patient Care Team: Diedra Lame, MD as PCP - General (Family Medicine) Rennie Cindy SAUNDERS, MD as Consulting Physician (Oncology) Lenn Aran, MD as Consulting Physician (Radiation Oncology)    REASON FOR CONSULTATION: Bradley [name] is a 89 y.o. 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  Patient has had progressive dysphagia and odynophagia.  Patient was referred to palliative care to address goals and manage ongoing symptoms.  SOCIAL HISTORY:     reports that he quit smoking about 25 years ago. His smoking use included cigarettes and cigars. He started smoking about 26 years ago. He has a 45 pack-year smoking history. He has never used smokeless tobacco. He reports that he does not currently use alcohol. He reports that he does not use drugs.  Patient married to his wife of 45 years.  Lives at [facility].  Has 3 children in [place]  from a previous marriage.  Patient retired as a production designer, theatre/television/film of a programme researcher, broadcasting/film/video.  ADVANCE DIRECTIVES:  On file  CODE STATUS: DNR/DNI (MOST form completed on 05/28/2024)  PAST MEDICAL HISTORY: Past Medical History:  Diagnosis Date   Anemia    BPH (benign prostatic hypertrophy)    Diabetes type 2, controlled (HCC)    GERD (gastroesophageal reflux disease)    RARE   Glaucoma    Syndor   Hearing loss    bilateral hearing aids occasionally   History of kidney stones    HLD (hyperlipidemia)    HTN (hypertension)    Macular degeneration    Right (Appenseler)   Osteoarthritis    thumbs, hips, knees (s/p R TKR)   PUD (peptic ulcer disease) 03/2013   2 antral gastric ulcers and 1 duodenal ulcer, NSAID related, admitted to [facility] with  melena/anemia s/p EGD   Sleep apnea    CPAP   Tremor    intention    PAST SURGICAL HISTORY:  Past Surgical History:  Procedure Laterality Date   BELPHAROPTOSIS REPAIR  2012   BIOPSY  11/10/2022   Procedure: BIOPSY;  Surgeon: Maryruth Ole DASEN, MD;  Location: [facility] ENDOSCOPY;  Service: Endoscopy;;   carotid US   12/2011   no significant stenosis   CATARACT EXTRACTION Bilateral 2011, 2012   CHOLECYSTECTOMY  1997   COLONOSCOPY  2009   per patient, told no longer needed   CYSTOSCOPY W/ URETERAL STENT PLACEMENT Left 11/01/2016   Procedure: CYSTOSCOPY WITH STENT REPLACEMENT;  Surgeon: Penne Knee, MD;  Location: [facility] ORS;  Service: Urology;  Laterality: Left;   CYSTOSCOPY WITH STENT PLACEMENT Left 10/26/2016   Procedure: CYSTOSCOPY WITH STENT PLACEMENT;  Surgeon: Penne Knee, MD;  Location: [facility] ORS;  Service: Urology;  Laterality: Left;   ESOPHAGOGASTRODUODENOSCOPY  04/2013   hospitalization @ [facility], gastritis, gastric ulcers and duodenal ulcer with clean base (Rein)   ESOPHAGOGASTRODUODENOSCOPY (EGD) WITH PROPOFOL  N/A 11/10/2022   Procedure: ESOPHAGOGASTRODUODENOSCOPY (EGD) WITH PROPOFOL ;  Surgeon: Maryruth Ole DASEN, MD;  Location: [facility] ENDOSCOPY;  Service: Endoscopy;  Laterality: N/A;   HOLEP-LASER ENUCLEATION OF THE PROSTATE WITH MORCELLATION N/A 11/01/2016   Procedure: HOLEP-LASER ENUCLEATION OF THE PROSTATE WITH MORCELLATION;  Surgeon: Penne Knee, MD;  Location: [facility] ORS;  Service: Urology;  Laterality: N/A;   KIDNEY STONE SURGERY  85, 89, 89   TOTAL KNEE ARTHROPLASTY Right  2006   URETEROSCOPY Left 10/26/2016   Procedure: URETEROSCOPY;  Surgeon: Penne Knee, MD;  Location: [facility] ORS;  Service: Urology;  Laterality: Left;   URETEROSCOPY WITH HOLMIUM LASER LITHOTRIPSY Left 11/01/2016   Procedure: URETEROSCOPY WITH HOLMIUM LASER LITHOTRIPSY;  Surgeon: Penne Knee, MD;  Location: [facility] ORS;  Service: Urology;  Laterality: Left;   US  ECHOCARDIOGRAPHY  12/2011   nl LV fxn, EF  60%, nl valves, dilated aortic root (40mm)    HEMATOLOGY/ONCOLOGY HISTORY:  Oncology History  Tonsillar cancer (HCC)  05/22/2024 Initial Diagnosis   Tonsillar cancer (HCC)   05/22/2024 Cancer Staging   Staging form: Pharynx - Oropharynx (HPV-Associated), AJCC V9 - Clinical: Stage III (cT4, cN2, cM0, p16+) - Signed by Rennie Cindy SAUNDERS, MD on 05/22/2024     ALLERGIES:  is allergic to sulfa antibiotics and penicillins.  MEDICATIONS:  Current Outpatient Medications  Medication Sig Dispense Refill   acetaminophen  (TYLENOL ) 500 MG tablet Take 1,000 mg by mouth daily as needed for headache, fever, moderate pain or mild pain.     albuterol  (VENTOLIN  HFA) 108 (90 Base) MCG/ACT inhaler Inhale 1-2 puffs into the lungs every 4 (four) hours as needed for shortness of breath or wheezing.     aspirin  EC (ASPIRIN  81) 81 MG tablet Take 1 tablet (81 mg total) by mouth daily. 30 tablet 12   atorvastatin  (LIPITOR) 20 MG tablet Take 20 mg by mouth every evening.      Biotin w/ Vitamins C & E (HAIR/SKIN/NAILS PO) Take 1,666 mg by mouth 2 (two) times daily.     brimonidine -timolol  (COMBIGAN ) 0.2-0.5 % ophthalmic solution Place 1 drop into both eyes 2 (two) times daily.      buPROPion  (WELLBUTRIN  XL) 150 MG 24 hr tablet Take 150 mg by mouth every morning.     carbidopa -levodopa  (SINEMET  CR) 50-200 MG tablet Take 1 tablet by mouth at bedtime.     carbidopa -levodopa  (SINEMET  IR) 25-100 MG tablet Take 2 tablets by mouth 4 (four) times daily. Extended release Taken at 0730, 1130, 1500 and 1830     Cholecalciferol  (VITAMIN D3) 1000 units CAPS Take 1,000 Units by mouth 2 (two) times daily.     docusate sodium  (COLACE) 100 MG capsule Take 100 mg by mouth 2 (two) times daily.     finasteride  (PROSCAR ) 5 MG tablet Take 5 mg by mouth every morning.      ipratropium (ATROVENT ) 0.06 % nasal spray Place 1 spray into both nostrils 2 (two) times daily as needed for rhinitis.     lamoTRIgine (LAMICTAL) 25 MG tablet  Take 25 mg by mouth 2 (two) times daily.     Melatonin 10 MG TABS Take 10 mg by mouth at bedtime.     metFORMIN (GLUCOPHAGE-XR) 500 MG 24 hr tablet Take 500 mg by mouth daily with breakfast.     Multiple Vitamins-Minerals (PRESERVISION AREDS 2 PO) Take 1 tablet by mouth 2 (two) times daily.     pantoprazole  (PROTONIX ) 40 MG tablet Take 1 tablet (40 mg total) by mouth 2 (two) times daily. 120 tablet 0   pantoprazole  (PROTONIX ) 40 MG tablet Take 1 tablet (40 mg total) by mouth daily. 360 tablet 0   polyethylene glycol powder (GLYCOLAX /MIRALAX ) 17 GM/SCOOP powder Take 17 g by mouth daily.     rOPINIRole  (REQUIP ) 0.5 MG tablet Take 0.5 mg by mouth QID. 8 am, 1130, 1500, 1830     sertraline  (ZOLOFT ) 100 MG tablet Take 100 mg by mouth daily.     torsemide (  DEMADEX) 20 MG tablet Take 10 mg by mouth daily. Takes M-Sat am     No current facility-administered medications for this visit.    VITAL SIGNS: There were no vitals taken for this visit. There were no vitals filed for this visit.  Estimated body mass index is 24.97 kg/m as calculated from the following:   Height as of 05/22/24: 5' 6 (1.676 m).   Weight as of 05/22/24: 154 lb 11.2 oz (70.2 kg).  LABS: CBC:    Component Value Date/Time   WBC 6.8 05/22/2024 1543   WBC 8.5 08/25/2023 1112   HGB 13.0 05/22/2024 1543   HGB 9.3 (L) 04/23/2013 1422   HGB 15.3 12/01/2011 0000   HCT 39.8 05/22/2024 1543   HCT 27.5 (L) 04/23/2013 0523   PLT 216 05/22/2024 1543   PLT 164 04/23/2013 0523   MCV 96.6 05/22/2024 1543   MCV 94 04/23/2013 0523   NEUTROABS 4.3 05/22/2024 1543   NEUTROABS 4.1 04/23/2013 0523   LYMPHSABS 1.7 05/22/2024 1543   LYMPHSABS 1.6 04/23/2013 0523   MONOABS 0.5 05/22/2024 1543   MONOABS 0.5 04/23/2013 0523   EOSABS 0.2 05/22/2024 1543   EOSABS 0.2 04/23/2013 0523   BASOSABS 0.0 05/22/2024 1543   BASOSABS 0.0 04/23/2013 0523   Comprehensive Metabolic Panel:    Component Value Date/Time   NA 140 05/22/2024 1543    NA 143 04/23/2013 0523   K 4.3 05/22/2024 1543   K 3.2 (L) 04/23/2013 0523   K 4.2 12/01/2011 0000   CL 102 05/22/2024 1543   CL 109 (H) 04/23/2013 0523   CO2 29 05/22/2024 1543   CO2 29 04/23/2013 0523   BUN 27 (H) 05/22/2024 1543   BUN 13 04/23/2013 0523   CREATININE 0.97 05/22/2024 1543   CREATININE 0.68 04/23/2013 0523   CREATININE 0.79 12/01/2011 0000   GLUCOSE 114 (H) 05/22/2024 1543   GLUCOSE 78 04/23/2013 0523   CALCIUM  9.4 05/22/2024 1543   CALCIUM  7.7 (L) 04/23/2013 0523   AST 27 05/22/2024 1543   ALT <5 05/22/2024 1543   ALT 25 04/23/2013 0523   ALKPHOS 71 05/22/2024 1543   ALKPHOS 43 (L) 04/23/2013 0523   ALKPHOS 53 12/01/2011 0000   BILITOT 0.3 05/22/2024 1543   PROT 6.9 05/22/2024 1543   PROT 5.1 (L) 04/23/2013 0523   ALBUMIN 4.0 05/22/2024 1543   ALBUMIN 2.6 (L) 04/23/2013 0523    RADIOGRAPHIC STUDIES: DG SWALLOW FUNC OP MEDICARE SPEECH PATH Result Date: 05/25/2024 Table formatting from the original result was not included. 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 Since detection, the left neck mass has increased in size, and a new contralateral neck mass developed over the past 1-2 days. He describes the left neck mass as feeling like a rubber ball. Relevant comorbidities include Parkinson's disease with progressive hand and jaw tremors, difficulty with fine motor tasks, cognitive decline, and finger numbness. He is legally blind (vision 20/2000) but remains independent in activities of daily living with assistive devices. He has type 2 diabetes and a remote tobacco use history, having quit in 2000 after a 30-year history.     Per pt and his wife's rpeort, he is currently  seeing a speech therapist at [facility] twice a week for cognition.  Per chart he also participated in [facility] (01/08/2019 thru 02/12/2019).     CT Soft Tissue Neck 04/13/2024  There is an irregular soft tissue mass involving the left base of tongue with  effacement of the vallecula and extension along the lingual epiglottic surface.  There is extension deep into the floor of mouth, more pronounced on the left  though also involving the right. In total, the irregular soft tissue mass  measures approximately 4.8 x 4.3 x 4.7 cm (AP x TR x CC) and involves the left  lateral pharyngeal sidewall. There is extension to involve the contralateral  right base of tongue region as well. Clinical Impression: Pt presents with overall adequate oropharyngeal abilities when consuming thin liquids, nectar thick liquids, puree, graham cracker with barium paste and whole barium tablet with thin liquids. When consuming puree and soft solids, pt was observed grimacing with report of increased pain to 8 out of 10 therefore longer more deliberate handling of the boluses orally was observed. No aspiration or penetration was observed. Extensive education provided on the results of this study, aspiration precautions, diet recommendation as well as Outpatient ST services targeting perseveration of swallow function during radiation. All questions were answered to pt and his wife's satisfaction. Factors that may increase risk of adverse event in presence of aspiration Noe & Lianne 2021): Factors that may increase risk of adverse event in presence of aspiration Noe & Lianne 2021): Reduced cognitive function (upcoming radiation of HNC cancer, pain with solids, tumor presence) Recommendations/Plan: Swallowing Evaluation Recommendations Swallowing Evaluation Recommendations Recommendations: PO diet PO Diet Recommendation: Regular; Thin liquids (Level 0) Liquid Administration via: Cup; Straw Medication Administration: Whole meds with  liquid Supervision: Patient able to self-feed Swallowing strategies  : Minimize environmental distractions; Slow rate; Small bites/sips Postural changes: Position pt fully upright for meals; Stay upright 30-60 min after meals Oral care recommendations: Oral care BID (2x/day) Treatment Plan Treatment Plan Treatment recommendations: No treatment recommended at this time Follow-up recommendations: Outpatient SLP (to follow throughout [facility] treatment) Recommendations Recommendations for follow up therapy are one component of a multi-disciplinary discharge planning process, led by the attending physician.  Recommendations may be updated based on patient status, additional functional criteria and insurance authorization. Assessment: Orofacial Exam: Orofacial Exam Oral Cavity: Oral Hygiene: WFL Oral Cavity - Dentition: Dentures, top; Dentures, bottom Orofacial Anatomy: WFL Oral Motor/Sensory Function: WFL (reports increased pain during lingual lateralization) Anatomy: Anatomy: WFL Boluses Administered: Boluses Administered Boluses Administered: Thin liquids (Level 0); Mildly thick liquids (Level 2, nectar thick); Puree; Solid  Oral Impairment Domain: Oral Impairment Domain Lip Closure: No labial escape Tongue control during bolus hold: Cohesive bolus between tongue to palatal seal Bolus preparation/mastication: Timely and efficient chewing and mashing (when consuming puree and soft solids, pt grimacing with report of increased pain to 8 out of 10 therefore longer more deliberate handling of boluses orally) Bolus transport/lingual motion: Brisk tongue motion Oral residue: Trace residue lining oral structures Location of oral residue : Tongue Initiation of pharyngeal swallow : Valleculae  Pharyngeal Impairment Domain: Pharyngeal Impairment Domain Soft palate elevation: No bolus between soft palate (SP)/pharyngeal wall (PW) Laryngeal elevation: Complete superior movement of thyroid cartilage with complete approximation of  arytenoids to epiglottic petiole Anterior hyoid excursion: Complete anterior movement Epiglottic movement: Complete inversion Laryngeal vestibule closure: Complete, no air/contrast in laryngeal vestibule Pharyngeal stripping wave : Present - complete Pharyngoesophageal segment opening: Complete distension and complete duration, no obstruction of flow Tongue base retraction: Trace column of contrast or air  between tongue base and PPW Pharyngeal residue: Trace residue within or on pharyngeal structures Location of pharyngeal residue: Pharyngeal wall; Tongue base  Esophageal Impairment Domain: Esophageal Impairment Domain Esophageal clearance upright position: Complete clearance, esophageal coating Pill: Pill Consistency administered: Thin liquids (Level 0) Thin liquids (Level 0): [facility] Penetration/Aspiration Scale Score: Penetration/Aspiration Scale Score 1.  Material does not enter airway: Thin liquids (Level 0); Mildly thick liquids (Level 2, nectar thick); Puree; Solid; Pill Compensatory Strategies: No data recorded  General Information: Caregiver present: Yes  Diet Prior to this Study: Regular; Thin liquids (Level 0)   Temperature : Normal   Respiratory Status: WFL   Supplemental O2: None (Room air)   History of Recent Intubation: No  Behavior/Cognition: Alert; Cooperative; Pleasant mood Self-Feeding Abilities: Able to self-feed Baseline vocal quality/speech: Normal Volitional Cough: Able to elicit Volitional Swallow: Able to elicit Exam Limitations: No limitations Goal Planning: No data recorded No data recorded No data recorded Patient/Family Stated Goal: to presevere swallow function throughout treatment for [facility] Acute Care Consulted and agree with results and recommendations: Patient; Family member/caregiver; Physician; Nurse Pain: Pain Assessment Pain Assessment: No/denies pain (increaed to 8 out of 10 (posterior oral cavity/base of tongue) when consuming puree and soft solids) End of Session: Start Time:SLP Start Time  (ACUTE ONLY): 0930 Stop Time: SLP Stop Time (ACUTE ONLY): 1015 Time Calculation:SLP Time Calculation (min) (ACUTE ONLY): 45 min Charges: SLP Evaluations $ SLP Speech Visit: 1 Visit SLP Evaluations $MBS Swallow: 1 Procedure $Self Care/Home Management: 8-22 SLP visit diagnosis: SLP Visit Diagnosis: Dysphagia, unspecified (R13.10) Past Medical History: Past Medical History: Diagnosis Date  Anemia   BPH (benign prostatic hypertrophy)   Diabetes type 2, controlled (HCC)   GERD (gastroesophageal reflux disease)   RARE  Glaucoma   Syndor  Hearing loss   bilateral hearing aids occasionally  History of kidney stones   HLD (hyperlipidemia)   HTN (hypertension)   Macular degeneration   Right (Appenseler)  Osteoarthritis   thumbs, hips, knees (s/p R TKR)  PUD (peptic ulcer disease) 03/2013  2 antral gastric ulcers and 1 duodenal ulcer, NSAID related, admitted to [facility] with melena/anemia s/p EGD  Sleep apnea   CPAP  Tremor   intention Past Surgical History: Past Surgical History: Procedure Laterality Date  BELPHAROPTOSIS REPAIR  2012  BIOPSY  11/10/2022  Procedure: BIOPSY;  Surgeon: Maryruth Ole DASEN, MD;  Location: [facility] END[facility];  Service: Endoscopy;;  carotid US   12/2011  no significant stenosis  CATARACT EXTRACTION Bilateral 2011, 2012  CHOLECYSTECTOMY  1997  COLONOSCOPY  2009  per patient, told no longer needed  CYSTOSCOPY W/ URETERAL STENT PLACEMENT Left 11/01/2016  Procedure: CYSTOSCOPY WITH STENT REPLACEMENT;  Surgeon: Penne Knee, MD;  Location: [facility] ORS;  Service: Urology;  Laterality: Left;  CYSTOSCOPY WITH STENT PLACEMENT Left 10/26/2016  Procedure: CYSTOSCOPY WITH STENT PLACEMENT;  Surgeon: Penne Knee, MD;  Location: [facility] OR[facility];  Service: Urology;  Laterality: Left;  ESOPHAGOGASTRODUODENOSCOPY  04/2013  hospitalization @ [facility], gastritis, gastric ulcers and duodenal ulcer with clean base (Rein)  ESOPHAGOGASTRODUODENOSCOPY (EGD) WITH PROPOFOL  N/A 11/10/2022  Procedure: ESOPHAGOGASTRODUODENOSCOPY (EGD) WITH PROPOFOL ;   Surgeon: Maryruth Ole DASEN, MD;  Location: [facility] END[facility];  Service: Endoscopy;  Laterality: N/A;  HOLEP-LASER ENUCLEATION OF THE PROSTATE WITH MORCELLATION N/A 11/01/2016  Procedure: HOLEP-LASER ENUCLEATION OF THE PROSTATE WITH MORCELLATION;  Surgeon: Penne Knee, MD;  Location: [facility] ORS;  Service: Urology;  Laterality: N/A;  KIDNEY STONE SURGERY  83, 87, 90  TOTAL KNEE ARTHROPLASTY Right 2006  URETEROSCOPY Left 10/26/2016  Procedure: URETEROSCOPY;  Surgeon: Penne Knee, MD;  Location: [facility] ORS;  Service: Urology;  Laterality: Left;  URETEROSCOPY WITH HOLMIUM LASER LITHOTRIPSY Left 11/01/2016  Procedure: URETEROSCOPY WITH HOLMIUM LASER LITHOTRIPSY;  Surgeon: Penne Knee, MD;  Location: [facility] ORS;  Service: Urology;  Laterality: Left;  US  ECHOCARDIOGRAPHY  12/2011  nl LV fxn, EF 60%, nl valves, dilated aortic root (40mm) Happi Overton 05/25/2024, 4:20 PM CLINICAL DATA:  Patient with dysphagia following head and neck radiation. EXAM: MODIFIED BARIUM SWALLOW TECHNIQUE: Radiologist, not in attendance for the exam. Different consistencies of barium were administered orally to the patient by the Speech Pathologist. Imaging of the pharynx was performed in the lateral projection. The radiology APP, Brittany Huneycutt, NP, was present in the fluoroscopy room for this study, providing personal supervision. FLUOROSCOPY: Radiation Exposure Index (as provided by the fluoroscopic device): 13.2 mGy Kerma COMPARISON:  04/13/24 CT Soft Tissue Neck FINDINGS: Vestibular  Penetration:  None seen. Aspiration:  None seen. Other:  None. IMPRESSION: No vestibular penetration or aspiration seen. Please refer to the Speech Pathologists report for complete details and recommendations. Electronically Signed   By: Wilkie Lent M.D.   On: 05/25/2024 13:02  NM PET Image Initial (PI) Skull Base To Thigh (F-18 FDG) Result Date: 05/16/2024 CLINICAL DATA:  Initial treatment strategy for head neck cancer. EXAM: NUCLEAR MEDICINE PET  SKULL BASE TO THIGH TECHNIQUE: 8.4 mCi F-18 FDG was injected intravenously. Full-ring PET imaging was performed from the skull base to thigh after the radiotracer. CT data was obtained and used for attenuation correction and anatomic localization. Fasting blood glucose: 137 mg/dl COMPARISON:  Neck CT 88/78/7974 FINDINGS: Mediastinal blood pool activity: SUV max 2.4 Liver activity: SUV max NA NECK: Hypermetabolic uptake in the inferior oropharynx along the left floor of mouth is hypermetabolic with SUV max = 11.7. Small focus of hypermetabolism extends up into the posterior left nasopharyngeal region with SUV max = 4.7. Right-sided level II lymph node measures 2.9 x 2.0 cm today with SUV max = 10.0. Left-sided level II and III lymph nodes demonstrate SUV max up to 10.3. Incidental CT findings: Irregular soft tissue in the left oropharynx and floor mouth was better evaluated on previous neck CT with contrast material. CHEST: No hypermetabolic mediastinal or hilar nodes. No suspicious pulmonary nodules on the CT scan. Incidental CT findings: The heart is enlarged. Coronary artery calcification is evident. Moderate atherosclerotic calcification is noted in the wall of the thoracic aorta. Ascending thoracic aorta measures 4.6 cm diameter. Areas of noncalcified pleural thickening in the right hemithorax show no discernible hypermetabolism. ABDOMEN/PELVIS: No abnormal hypermetabolic activity within the liver, pancreas, adrenal glands, or spleen. No hypermetabolic lymph nodes in the abdomen or pelvis. Incidental CT findings: Cholecystectomy. Relatively diffuse thickening of the gastric wall noted without hypermetabolism. Bilateral nonobstructing nephrolithiasis. There is moderate atherosclerotic calcification of the abdominal aorta without aneurysm. Left colonic diverticulosis without diverticulitis. Prostate gland is markedly enlarged. SKELETON: No focal hypermetabolic activity to suggest skeletal metastasis. Incidental CT  findings: No worrisome lytic or sclerotic osseous abnormality. IMPRESSION: 1. Hypermetabolic uptake in the inferior oropharynx along the left floor of mouth with extension up into the posterior left nasopharyngeal region. Imaging features compatible with known primary neoplasm. 2. Hypermetabolic bilateral level II and left level III cervical lymph nodes compatible with metastatic disease. 3. No evidence for hypermetabolic metastatic disease in the chest, abdomen, or pelvis. 4. Bilateral nonobstructing nephrolithiasis. 5. Left colonic diverticulosis without diverticulitis. 6. Marked prostatomegaly. 7.  Aortic Atherosclerosis (ICD10-I70.0). Electronically Signed  By: Camellia Candle M.D.   On: 05/16/2024 11:09   US  CORE BIOPSY (LYMPH NODES) Result Date: 05/07/2024 INDICATION: 89 year old male with bilateral cervical lymphadenopathy and a mass at the base of the tongue. Findings are concerning for metastatic head and neck cancer. He presents for ultrasound-guided core biopsy to confirm tissue diagnosis. EXAM: ULTRASOUND BIOPSY OF THE LYMPH NODES MEDICATIONS: None. ANESTHESIA/SEDATION: None. FLUOROSCOPY TIME:  None. COMPLICATIONS: None immediate. PROCEDURE: Informed written consent was obtained from the patient after a thorough discussion of the procedural risks, benefits and alternatives. All questions were addressed. Maximal Sterile Barrier Technique was utilized including caps, mask, sterile gowns, sterile gloves, sterile drape, hand hygiene and skin antiseptic. A timeout was performed prior to the initiation of the procedure. The right neck was interrogated with ultrasound. A large irregular lymph node is identified in the upper cervical chain at the site of the palpable abnormality. A skin entry site was selected and marked. Local anesthesia was attained by infiltration with 1% lidocaine . A small dermatotomy was made. Under real-time ultrasound guidance, multiple 18 gauge core biopsies were obtained using the  automated biopsy device. Biopsy cores were placed on a moistened Telfa pad and delivered to pathology for further analysis. Post biopsy imaging demonstrates no evidence of complication. IMPRESSION: Successful ultrasound-guided core biopsy of right upper cervical lymph node. Electronically Signed   By: Wilkie Lent M.D.   On: 05/07/2024 13:36   MR BRAIN W WO CONTRAST Result Date: 05/04/2024 EXAM: MRI BRAIN WITH AND WITHOUT CONTRAST 04/30/2024 08:00:00 PM TECHNIQUE: Multiplanar multisequence MRI of the head/brain was performed with and without the administration of intravenous contrast. CONTRAST: 7.5 mL of Gadavist . COMPARISON: MR Head 12/30/2016. CLINICAL HISTORY: FINDINGS: BRAIN AND VENTRICLES: Mild to moderate generalized parenchymal volume loss which is slightly increased since 2018. There is mild volume loss of the hippocampi. Slight widening of the collateral sulci bilaterally which is increased from prior. Similar encephalomalacia in the right occipital lobe compatible with remote infarct. Moderate chronic microvascular ischemic changes are also slightly increased since 2018. There are multiple chronic microhemorrhages in the cerebral hemispheres right greater than left accounting for differences in technique; these appear increased from the prior study. Distribution is concerning for potential cerebral amyloid angiopathy susceptibility along the sulci particularly of the right superior frontal sulcus as well as within the lateral right temporal lobe and posterior right occipital lobe suggestive of remote hemorrhage. No acute infarct. No acute intracranial hemorrhage. No mass effect or midline shift. No hydrocephalus. The sella is unremarkable. Normal flow voids. No abnormal intracranial enhancement. ORBITS: Bilateral lens replacement. SINUSES: Mucosal thickening in the left sphenoid sinus. BONES AND SOFT TISSUES: Small left mastoid effusion. Normal bone marrow signal and enhancement. No acute soft  tissue abnormality. IMPRESSION: 1. No acute findings. No abnormal enhancement. 2. 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 3. Mild to moderate generalized parenchymal volume loss, slightly increased since 2018. 4. Moderate chronic microvascular ischemic changes, slightly increased since 2018. 5. 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 Electronically signed by: Donnice Mania MD 05/04/2024 11:12 PM EST RP Workstation: [id number]    PERFORMANCE STATUS (ECOG) : 1 - Symptomatic but completely ambulatory  Review of Systems Unless otherwise noted, a complete review of systems is negative.  Physical Exam General: NAD Pulmonary: Unlabored Extremities: no edema, no joint deformities Skin: no rashes Neurological: Weakness but otherwise nonfocal  IMPRESSION: Patient with newly diagnosed locally advanced head  neck cancer.  Patient pending initiation of CarboTaxol and XRT.  Has been referred to SLP and also to surgery for PEG placement.  Had a lengthy meeting with patient and wife.  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  He has been referred to SLP and is also pending surgery for PEG placement.  Symptomatically, he endorses occasional jaw pain.  He is not currently taking anything for pain but is interested in having something to take if needed.  Patient says that he has tolerated Norco in the past.  Recommended daily bowel regimen to prevent opioid-induced constipation.  Patient is weak at baseline with peripheral neuropathy from Parkinson's.  He recently completed working with physical therapy.  Will have him establish with OT for ongoing support.  Will also send referral to nutrition given upcoming PEG.  I completed a MOST form today. The patient and family outlined their wishes for the following treatment decisions:  Cardiopulmonary  Resuscitation: Do Not Attempt Resuscitation (DNR/No CPR)  Medical Interventions: Limited Additional Interventions: Use medical treatment, IV fluids and cardiac monitoring as indicated, DO NOT USE intubation or mechanical ventilation. May consider use of less invasive airway support such as BiPAP or CPAP. Also provide comfort measures. Transfer to the hospital if indicated. Avoid intensive care.   Antibiotics: Determine use of limitation of antibiotics when infection occurs  IV Fluids: IV fluids for a defined trial period  Feeding Tube: Feeding tube for a defined trial period    PLAN: - Continue current scope of treatment - Norco 5-325mg  every 6 hours as needed for pain #30 - PDMP reviewed - Daily bowel regimen with MiraLAX /senna - Prochlorperazine /ondansetron  as needed for nausea - Emla  cream - Referral to nutrition - Referral for rehab screening - DNR/DNI - MOST form completed - RTC TBD  Case and plan discussed with Dr. Rennie  Patient expressed understanding and was in agreement with this plan. He also understands that He can call the clinic at any time with any questions, concerns, or complaints.     Time Total: 60 minutes  Visit consisted of counseling and education dealing with the complex and emotionally intense issues of symptom management and palliative care in the setting of serious and potentially life-threatening illness.Greater than 50%  of this time was spent counseling and coordinating care related to the above assessment and plan.  Signed by: Fonda Mower, PhD, NP-C   "

## 2024-05-29 ENCOUNTER — Encounter
Admission: RE | Admit: 2024-05-29 | Discharge: 2024-05-29 | Disposition: A | Discharge status: Home / Self Care | Source: Ambulatory Visit | Attending: General Surgery | Admitting: General Surgery

## 2024-05-29 ENCOUNTER — Ambulatory Visit: Payer: Self-pay | Admitting: General Surgery

## 2024-05-29 VITALS — BP 113/69 | HR 64 | Temp 97.4°F | Resp 20 | Ht 66.0 in | Wt 155.0 lb

## 2024-05-29 DIAGNOSIS — E119 Type 2 diabetes mellitus without complications: Secondary | ICD-10-CM

## 2024-05-29 DIAGNOSIS — E785 Hyperlipidemia, unspecified: Secondary | ICD-10-CM | POA: Insufficient documentation

## 2024-05-29 DIAGNOSIS — N179 Acute kidney failure, unspecified: Secondary | ICD-10-CM | POA: Insufficient documentation

## 2024-05-29 DIAGNOSIS — Z01812 Encounter for preprocedural laboratory examination: Secondary | ICD-10-CM

## 2024-05-29 DIAGNOSIS — Z01810 Encounter for preprocedural cardiovascular examination: Secondary | ICD-10-CM | POA: Diagnosis not present

## 2024-05-29 HISTORY — DX: Chronic obstructive pulmonary disease, unspecified: J44.9

## 2024-05-29 HISTORY — DX: Myoneural disorder, unspecified: G70.9

## 2024-05-29 HISTORY — DX: Gastrointestinal hemorrhage, unspecified: K92.2

## 2024-05-29 HISTORY — DX: Parkinson's disease without dyskinesia, without mention of fluctuations: G20.A1

## 2024-05-29 NOTE — Patient Instructions (Signed)
 Your procedure is scheduled on: Jan 01/2025  Report to the Registration Desk on the 1st floor of the [facility]. To find out your arrival time, please call [phone] between 1PM - 3PM on: Jan 12/2024  If your arrival time is 6:00 am, do not arrive before that time as the [facility] entrance doors do not open until 6:00 am.  REMEMBER: Instructions that are not followed completely may result in serious medical risk, up to and including death; or upon the discretion of your surgeon and anesthesiologist your surgery may need to be rescheduled.  Do not eat food after midnight the night before surgery.  No gum chewing or hard candies.   One week prior to surgery: Stop Anti-inflammatories (NSAIDS) such as Advil, Aleve, Ibuprofen, Motrin, Naproxen, Naprosyn and Aspirin  based products such as Excedrin, Goody's Powder, BC Powder. Stop ANY OVER THE COUNTER supplements until after surgery.  You may however, continue to take Tylenol  if needed for pain up until the day of surgery.   Follow instructions about aspirin  by you medical doctor.  Do not take metformin 2 days before surgery. Last dos eof metformin  Continue taking all of your other prescription medications up until the day of surgery.  ON THE DAY OF SURGERY ONLY TAKE THESE MEDICATIONS WITH SIPS OF WATER:  buPROPion  (WELLBUTRIN  XL) carbidopa -levodopa - am  finasteride  (PROSCAR ) lamoTRIgine (LAMICTAL) pantoprazole  (PROTONIX )  rOPINIRole  (REQUIP )   Use inhalers on the day of surgery and bring to the hospital.   No Alcohol for 24 hours before or after surgery.  No Smoking including e-cigarettes for 24 hours before surgery.  No chewable tobacco products for at least 6 hours before surgery.  No nicotine patches on the day of surgery.  Do not use any recreational drugs for at least a week (preferably 2 weeks) before your surgery.  Please be advised that the combination of cocaine and anesthesia may have negative outcomes, up  to and including death. If you test positive for cocaine, your surgery will be cancelled.  On the morning of surgery brush your teeth with toothpaste and water, you may rinse your mouth with mouthwash if you wish. Do not swallow any toothpaste or mouthwash.  Use CHG Soap or wipes as directed on instruction sheet.  Do not wear jewelry, make-up, hairpins, clips or nail polish.  For welded (permanent) jewelry: bracelets, anklets, waist bands, etc.  Please have this removed prior to surgery.  If it is not removed, there is a chance that hospital personnel will need to cut it off on the day of surgery.  Do not wear lotions, powders, or perfumes.   Do not shave body hair from the neck down 48 hours before surgery.  Contact lenses, hearing aids and dentures may not be worn into surgery.  Do not bring valuables to the hospital. [facility] is not responsible for any missing/lost belongings or valuables.    Notify your doctor if there is any change in your medical condition (cold, fever, infection).  Wear comfortable clothing (specific to your surgery type) to the hospital.  After surgery, you can help prevent lung complications by doing breathing exercises.  Take deep breaths and cough every 1-2 hours. Your doctor may order a device called an Incentive Spirometer to help you take deep breaths.  If you are being admitted to the hospital overnight, leave your suitcase in the car. After surgery it may be brought to your room.  In case of increased patient census, it may be necessary  for you, the patient, to continue your postoperative care in the Same Day Surgery department.  If you are being discharged the day of surgery, you will not be allowed to drive home. You will need a responsible individual to drive you home and stay with you for 24 hours after surgery.   If you are taking public transportation, you will need to have a responsible individual with you.  Please call the  Pre-admissions Testing Dept. at [phone] if you have any questions about these instructions.  Surgery Visitation Policy:  Patients having surgery or a procedure may have two visitors.  Children under the age of 10 must have an adult with them who is not the patient.  Inpatient Visitation:    Visiting hours are 7 a.m. to 8 p.m. Up to four visitors are allowed at one time in a patient room. The visitors may rotate out with other people during the day.  One visitor age 13 or older may stay with the patient overnight and must be in the room by 8 p.m.   Merchandiser, Retail to address health-related social needs:  [email or web address]                                                                                                             Preparing for Surgery with CHLORHEXIDINE GLUCONATE (CHG) Soap  Chlorhexidine Gluconate (CHG) Soap  o An antiseptic cleaner that kills germs and bonds with the skin to continue killing germs even after washing  o Used for showering the night before surgery and morning of surgery  Before surgery, you can play an important role by reducing the number of germs on your skin.  CHG (Chlorhexidine gluconate) soap is an antiseptic cleanser which kills germs and bonds with the skin to continue killing germs even after washing.  Please do not use if you have an allergy to CHG or antibacterial soaps. If your skin becomes reddened/irritated stop using the CHG.  1. Shower the NIGHT BEFORE SURGERY with CHG soap.  2. If you choose to wash your hair, wash your hair first as usual with your normal shampoo.  3. After shampooing, rinse your hair and body thoroughly to remove the shampoo.  4. Use CHG as you would any other liquid soap. You can apply CHG directly to the skin and wash gently with a clean washcloth.  5. Apply the CHG soap to your body only from the neck down. Do not use on open wounds or open sores. Avoid contact with your  eyes, ears, mouth, and genitals (private parts). Wash face and genitals (private parts) with your normal soap.  6. Wash thoroughly, paying special attention to the area where your surgery will be performed.  7. Thoroughly rinse your body with warm water.  8. Do not shower/wash with your normal soap after using and rinsing off the CHG soap.  9. Do not use lotions, oils, etc., after showering with CHG.  10. Pat yourself dry with a clean towel.  11. Wear clean pajamas to bed the night  before surgery.  12. Place clean sheets on your bed the night of your shower and do not sleep with pets.  13. Do not apply any deodorants/lotions/powders.  14. Please wear clean clothes to the hospital.  15. Remember to brush your teeth with your regular toothpaste.

## 2024-05-30 ENCOUNTER — Inpatient Hospital Stay: Admitting: Hospice and Palliative Medicine

## 2024-05-30 DIAGNOSIS — C099 Malignant neoplasm of tonsil, unspecified: Secondary | ICD-10-CM

## 2024-05-30 NOTE — Progress Notes (Signed)
 Last Cardiology Note:  Progress Notes - documented in this encounter  Bradley Cara Endow, MD - 04/16/2024 11:00 AM EST Formatting of this note is different from the original. Established Patient Visit  Chief Complaint: Chief Complaint Patient presents with Follow-up 6 month follow up Date of Service: 04/16/2024 Date of Birth: [birth year] PCP: Diedra Jerona Ruts, MD History of Present Illness: Bradley [name] is a 89 y.o.male patient who presents for a 6 month follow up. PMH significant for claudication, SOB, hx of CVA, angina, hypertension, atypical parkinsonism, OSA, type 2 diabetes.  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 He has difficulty swallowing foods. States that a glass of milk can be difficult. Patient will be seeing ENT later today. No changes made today.  Visit Summaries: 09/28/2023 Patient was seen by me for a follow up. Start torsemide. Wants medications through [facility] only. Past Medical and Surgical History Past Medical History Past Medical History: Diagnosis Date Arthritis Chronic obstructive pulmonary disease (CMS/HHS-HCC) 09/26/2023 Dementia (CMS-HCC) Diabetes mellitus type 2, uncomplicated (CMS/HHS-HCC) GERD (gastroesophageal reflux disease) Glaucoma (increased eye pressure) History of stroke Unknown mri showed Hyperlipidemia Hypertension Macular degeneration Multiple gastric ulcers Parkinson disease (CMS/HHS-HCC) 12/2016 Sleep apnea 01/2015 Now have cpac machine Tremor  Past Surgical History He has a past surgical history that includes Kidney Stone Removal (1983, 1987, 1990); Cholecystectomy; Knee arthroscopy; Cataract extraction; Blepharoplasty; Joint replacement (05/2004); and EGD @ [facility] (11/10/2022).  Medications and Allergies Current Medications  Current Outpatient Medications Medication Sig Dispense Refill acetaminophen  (TYLENOL ) 500 MG tablet Take 500 mg by mouth every 6 (six) hours as needed for  Pain. albuterol  MDI, PROVENTIL , VENTOLIN , PROAIR , HFA 90 mcg/actuation inhaler USE 2 INHALATIONS BY MOUTH EVERY 6 HOURS AS NEEDED FOR WHEEZING 54 g 2 ANTIOX #8/OM3/DHA/EPA/LUT/ZEAX (PRESERVISION AREDS 2, OMEGA-3, ORAL) Take by mouth. aspirin  81 MG EC tablet Take 81 mg by mouth once daily 30 tablet 11 atorvastatin  (LIPITOR) 20 MG tablet TAKE 1 TABLET BY MOUTH ONCE DAILY 90 tablet 3 BIOTIN ORAL Take by mouth Twice a day brimonidine -timolol  (COMBIGAN ) 0.2-0.5 % ophthalmic solution 1 drop 2 (two) times daily. buPROPion  (WELLBUTRIN  XL) 150 MG XL tablet TAKE 1 TABLET BY MOUTH ONCE DAILY (Patient taking differently: Take 150 mg by mouth every morning) 90 tablet 3 carbidopa -levodopa  (SINEMET  CR) 50-200 mg CR tablet Take 1 tablet by mouth at bedtime 90 tablet 3 carbidopa -levodopa  (SINEMET ) 25-100 mg tablet Take 2 tablets by mouth 4 (four) times daily for 180 days 720 tablet 1 cholecalciferol  (VITAMIN D3) 1000 unit tablet Take by mouth docusate (COLACE) 100 MG capsule Take 100 mg by mouth 2 (two) times daily finasteride  (PROSCAR ) 5 mg tablet TAKE 1 TABLET BY MOUTH ONCE DAILY 90 tablet 3 ipratropium (ATROVENT ) 0.06 % nasal spray USE 2 SPRAYS IN BOTH NOSTRILS TWICE DAILY AS NEEDED FOR RHINITIS 75 mL 3 lamoTRIgine (LAMICTAL) 25 MG tablet Take 1 tablet (25 mg total) by mouth every 12 (twelve) hours 180 tablet 3 melatonin 5 mg Cap Take 10 mg by mouth nightly metFORMIN (GLUCOPHAGE-XR) 500 MG XR tablet Take 1 tablet (500 mg total) by mouth daily with dinner 90 tablet 3 pantoprazole  (PROTONIX ) 40 MG DR tablet Take 1 tablet (40 mg total) by mouth once daily Am 90 tablet 3 polyethylene glycol (MIRALAX ) powder Take 17 g by mouth once daily Mix in 4-8ounces of fluid prior to taking. rOPINIRole  (REQUIP ) 0.5 MG tablet Take 1 tablet (0.5 mg total) by mouth 4 (four) times daily 360 tablet 3 sertraline  (ZOLOFT ) 100 MG tablet TAKE 1  TABLET BY MOUTH ONCE DAILY 90 tablet 3 TORsemide (DEMADEX) 20 MG tablet Take 1 tablet (20  mg total) by mouth every Monday, Wednesday, and Friday (Patient taking differently: Take 10 mg by mouth as directed (edema) Monday- Saturday) 36 tablet 3  No current facility-administered medications for this visit.  Allergies: Penicillin, Sulfa (sulfonamide antibiotics), and Sulfasalazine  Social and Family History Social History reports that he quit smoking about 25 years ago. His smoking use included cigarettes. He has never used smokeless tobacco. He reports current alcohol use of about 4.0 standard drinks of alcohol per week. He reports that he does not use drugs.  Family History Family History Problem Relation Name Age of Onset Cancer Sister Mardeen Lung Hyperlipidemia (Elevated cholesterol) Sister Esther High blood pressure (Hypertension) Sister Mardeen Parkinsonism Sister Mardeen Tremor Sister Mardeen Dementia Sister Mardeen Diabetes Sister Mardeen Cancer Sister Mercer Lung Hyperlipidemia (Elevated cholesterol) Sister Mercer High blood pressure (Hypertension) Sister Mercer Dementia Sister Mercer Parkinsonism Sister Mercer Tremor Sister Mercer Myocardial Infarction (Heart attack) Father Prentice High blood pressure (Hypertension) Father Prentice Stroke Father Fred Coronary Artery Disease (Blocked arteries around heart) Father Fred Myocardial Infarction (Heart attack) Mother Geofm Hyperlipidemia (Elevated cholesterol) Mother Molly High blood pressure (Hypertension) Mother Geofm Coronary Artery Disease (Blocked arteries around heart) Mother Geofm Dementia Mother Geofm Alzheimer's disease Mother Molly Myocardial Infarction (Heart attack) Brother Ed Hyperlipidemia (Elevated cholesterol) Brother Ed High blood pressure (Hypertension) Brother Ed Parkinsonism Brother Ed  Review of Systems  Pertinent positives and negatives are mentioned above in HPI and all other systems are negative.  Physical Examination  Vitals:BP 96/60  Pulse 68  Ht 167.6 cm (5' 6)  Wt 69.4 kg (153 lb)   SpO2 96%  BMI 24.69 kg/m Ht:167.6 cm (5' 6) Wt:69.4 kg (153 lb) ADJ:Anib surface area is 1.8 meters squared. Body mass index is 24.69 kg/m.  HEENT: Pupils equally reactive to light and accomodation Neck: Supple without thyromegaly, carotid pulses 2+ Lungs: clear to auscultation bilaterally; no wheezes, rales, rhonchi Heart: Regular rate and rhythm. No gallops, murmurs or rub Abdomen: soft nontender, nondistended, with normal bowel sounds Extremities: no cyanosis, clubbing, or edema Peripheral Pulses: 2+ in all extremities, 2+ femoral pulses bilaterally Neurologic: Alert and oriented X3; speech intact; face symmetrical; moves all extremities well  Cardiovascular Studies:  Echocardiogram 2D complete: 05/16/2023 CONCLUSION ------------------------------------------------------------------------------- MILD LEFT VENTRICULAR SYSTOLIC DYSFUNCTION WITH MILD LVH ESTIMATED EF: 45%, CALC EF(2D): 46% NORMAL LA PRESSURES WITH DIASTOLIC DYSFUNCTION (GRADE 1) NORMAL RIGHT VENTRICULAR SYSTOLIC FUNCTION VALVULAR REGURGITATION: MILD AR, TRIVIAL MR, MILD PR, MILD TR NO VALVULAR STENOSIS PHYSICIAN IMPRESSIONS -------------------------------------------------------------------- Aortic Root Dilated 4.4cm LA AREA 20cm^2  NM Myocardial Perfusion SPECT multiple (stress and rest):  Cardiac Catheterization:  Holter:  Cardiac CT Scan:  Coronary CT Scan: 05/26/2023 IMPRESSION: 1. Coronary calcium  score of 2082.  2. Normal coronary origin with right dominance.  3. Severe stenosis (>70%) in proximal LAD and first diagonal (D1).  4. Mild stenosis in RCA, LCx (25-49%).  5. CAD-RADS 4 Severe stenosis. (70-99% or > 50% left main). Cardiac catheterization is recommended. Consider symptom-guided anti-ischemic pharmacotherapy as well as risk factor modification per guideline directed care.  6. Additional analysis with CT FFR will be submitted and reported separately.  Cardiac  MRI:   Assessment  89 y.o. male with 1. Essential hypertension, benign 2. Edema, lower extremity 3. Claudication () 4. Cerebrovascular accident (CVA), unspecified mechanism (CMS/HHS-HCC) 5. SOB (shortness of breath) 6. Type 2 diabetes mellitus without complications, unspecified whether long term insulin  use (CMS/HHS-HCC) 7.  Obstructive sleep apnea (adult) (pediatric) 8. Atypical parkinsonism (CMS/HHS-HCC) 9. Gastroesophageal reflux disease, unspecified whether esophagitis present  Plan  Hypertension, today's BP was 96/60, reasonably controlled Edema, continue torsemide for swelling in legs, recommend support stockings, BLE elevation, and reducing sodium intake Claudication, continue walking exercises, follow up with vascular SOB, previous history of smoking, continue refraining from tobacco Diabetes, on metformin, management per PCP 1C goal less than 7 OSA, recommend sleep study, CPAP indicated Parkinsonism, continue sinemet , requip  continue to follow-up with neurology GERD, continue protonix  for reflux type symptoms   Return in about 6 months (around 10/14/2024).  This note is partially written by Leita Ellen, in the presence of and acting as the scribe of Dr. Cara [name].  Leita Ellen  I have reviewed, edited and added to the note to reflect my best personal medical judgment.  Attestation Statement:  I personally performed the service. (TP)  Bradley JONETTA LOVELACE, MD  [facility] [place], [place] Ph: [phone] Fax: [phone] This note was generated in part with voice recognition software, Dragon. I apologize for any typographical errors that were not detected and corrected from this process. They are unintentional.   Electronically signed by [name] Cara Endow, MD at 04/19/2024 4:59 PM EST

## 2024-05-30 NOTE — Progress Notes (Signed)
 Last Pulmonology Note:  Progress Notes - documented in this encounter  Parris Manna, MD - 04/05/2024 2:00 PM EST Formatting of this note is different from the original. Images from the original note were not included.    DIVISION OF PULMONARY AND CRITICAL CARE MEDICINE FOLLOW UP ENCOUNTER   Chief complaint: COPD and OSA overlap  History of Present Illness Bradley [name] is an 89 year old male who presents for pulmonary follow-up.  He has been legally blind for approximately six months, which led to the revocation of his driver's license. Despite this, he continues to engage in activities such as driving a street-legal golf cart.  He has experienced significant weight loss over the past few years, losing approximately 100 pounds since 2020, attributed to lifestyle changes during the COVID-19 pandemic. His weight has stabilized over the past year at around 152 pounds.  He has a history of peripheral edema, which has improved significantly with the use of diuretics taken three times a week. The reduction in swelling has allowed him to feel his bones more prominently.  He has a history of anemia, which has shown improvement. He attributes this to better nutritional intake as he is home more often and cooking more.  His blood sugar levels have been slightly elevated, ranging from 160 to 180 mg/dL.  He has a history of sleep apnea and reports increased snoring. He does not use a CPAP machine but takes melatonin to aid sleep. No restless leg syndrome is noted.  He is on medications including Lamictal and Requip  for Parkinson's disease and seizure management, which he finds effective. He uses an inhaler once daily, taking two puffs in the morning, which helps with his breathing. He engages in physical activities such as kickboxing and physical therapy twice a week, which he finds beneficial.  He reports that his memory has not worsened recently.  Past Medical History:  Past Medical  History: Diagnosis Date Arthritis Chronic obstructive pulmonary disease (CMS/HHS-HCC) 09/26/2023 Dementia (CMS-HCC) Diabetes mellitus type 2, uncomplicated (CMS/HHS-HCC) GERD (gastroesophageal reflux disease) Glaucoma (increased eye pressure) History of stroke Unknown mri showed Hyperlipidemia Hypertension Macular degeneration Multiple gastric ulcers Parkinson disease (CMS/HHS-HCC) 12/2016 Sleep apnea 01/2015 Now have cpac machine Tremor  Past Surgical History:  Past Surgical History: Procedure Laterality Date JOINT REPLACEMENT 05/2004 Rich knee replacement EGD @ [facility] 11/10/2022 (Inpatient) Inpatient EGD with ulcers. Biopsies negative for H pylori. Repeat EGD prn/CTL BLEPHAROPLASTY CATARACT EXTRACTION CHOLECYSTECTOMY Kidney Stone Removal 1983, 1987, 1990 KNEE ARTHROSCOPY  Allergies:  Allergies Allergen Reactions Penicillin Rash Sulfa (Sulfonamide Antibiotics) Hives As infant Sulfasalazine Unknown As infant   Current Medications:  Prior to Admission medications Medication Sig Taking? Last Dose acetaminophen  (TYLENOL ) 500 MG tablet Take 500 mg by mouth every 6 (six) hours as needed for Pain. Yes Taking albuterol  MDI, PROVENTIL , VENTOLIN , PROAIR , HFA 90 mcg/actuation inhaler USE 2 INHALATIONS BY MOUTH EVERY 6 HOURS AS NEEDED FOR WHEEZING Yes Taking ANTIOX #8/OM3/DHA/EPA/LUT/ZEAX (PRESERVISION AREDS 2, OMEGA-3, ORAL) Take by mouth. Yes Taking aspirin  81 MG EC tablet Take 81 mg by mouth once daily Yes Taking atorvastatin  (LIPITOR) 20 MG tablet TAKE 1 TABLET BY MOUTH ONCE DAILY Yes Taking BIOTIN ORAL Take by mouth Twice a day Yes Taking brimonidine -timolol  (COMBIGAN ) 0.2-0.5 % ophthalmic solution 1 drop 2 (two) times daily. Yes Taking buPROPion  (WELLBUTRIN  XL) 150 MG XL tablet TAKE 1 TABLET BY MOUTH ONCE DAILY Patient taking differently: Take 150 mg by mouth every morning Yes Taking carbidopa -levodopa  (SINEMET  CR) 50-200 mg CR tablet Take 1 tablet  by mouth at bedtime  for 360 days Yes Taking cholecalciferol  (VITAMIN D3) 1000 unit tablet Take by mouth Yes Taking docusate (COLACE) 100 MG capsule Take 100 mg by mouth 2 (two) times daily Yes Taking finasteride  (PROSCAR ) 5 mg tablet TAKE 1 TABLET BY MOUTH ONCE DAILY Yes Taking ipratropium (ATROVENT ) 0.06 % nasal spray USE 2 SPRAYS IN BOTH NOSTRILS TWICE DAILY AS NEEDED FOR RHINITIS Yes Taking lamoTRIgine (LAMICTAL) 25 MG tablet Take 1 tablet (25 mg total) by mouth every 12 (twelve) hours Yes Taking melatonin 5 mg Cap Take 10 mg by mouth nightly Yes Taking metFORMIN (GLUCOPHAGE-XR) 500 MG XR tablet Take 1 tablet (500 mg total) by mouth daily with dinner Yes Taking pantoprazole  (PROTONIX ) 40 MG DR tablet Take 1 tablet (40 mg total) by mouth once daily Am Yes Taking polyethylene glycol (MIRALAX ) powder Take 17 g by mouth once daily Mix in 4-8ounces of fluid prior to taking. Yes Taking rOPINIRole  (REQUIP ) 0.5 MG tablet Take 1 tablet (0.5 mg total) by mouth 4 (four) times daily Yes Taking sertraline  (ZOLOFT ) 100 MG tablet TAKE 1 TABLET BY MOUTH ONCE DAILY Yes Taking TORsemide (DEMADEX) 20 MG tablet Take 1 tablet (20 mg total) by mouth every Monday, Wednesday, and Friday Yes Taking carbidopa -levodopa  (SINEMET ) 25-100 mg tablet Take 2 tablets by mouth 4 (four) times daily for 180 days Patient not taking: Reported on 04/05/2024 Not Taking  Family History:  Family History Problem Relation Name Age of Onset Cancer Sister Mardeen Lung Hyperlipidemia (Elevated cholesterol) Sister Esther High blood pressure (Hypertension) Sister Esther Parkinsonism Sister Mardeen Tremor Sister Mardeen Dementia Sister Mardeen Diabetes Sister Mardeen Cancer Sister Mercer Lung Hyperlipidemia (Elevated cholesterol) Sister Vivian High blood pressure (Hypertension) Sister Mercer Dementia Sister Mercer Parkinsonism Sister Mercer Tremor Sister Mercer Myocardial Infarction (Heart attack) Father Prentice High blood pressure (Hypertension) Father  Prentice Stroke Father Fred Coronary Artery Disease (Blocked arteries around heart) Father Fred Myocardial Infarction (Heart attack) Mother Molly Hyperlipidemia (Elevated cholesterol) Mother Molly High blood pressure (Hypertension) Mother Geofm Coronary Artery Disease (Blocked arteries around heart) Mother Molly Dementia Mother Geofm Alzheimer's disease Mother Molly Myocardial Infarction (Heart attack) Brother Ed Hyperlipidemia (Elevated cholesterol) Brother Ed High blood pressure (Hypertension) Brother Ed Parkinsonism Brother Ed  Social History:  Social History  Socioeconomic History Marital status: Married Tobacco Use Smoking status: Former Current packs/day: 0.00 Types: Cigarettes Quit date: 11/20/1998 Years since quitting: 25.3 Smokeless tobacco: Never Vaping Use Vaping status: Never Used Substance and Sexual Activity Alcohol use: Yes Alcohol/week: 4.0 standard drinks of alcohol Types: 2 Cans of beer, 2 Shots of liquor per week Drug use: No Sexual activity: Not Currently Partners: Female Birth control/protection: Surgical, Other-see comments  Social Drivers of Health  Financial Resource Strain: Low Risk (11/15/2023) Overall Financial Resource Strain (CARDIA) Difficulty of Paying Living Expenses: Not hard at all Food Insecurity: No Food Insecurity (11/15/2023) Hunger Vital Sign Worried About Running Out of Food in the Last Year: Never true Ran Out of Food in the Last Year: Never true Transportation Needs: No Transportation Needs (11/15/2023) PRAPARE - Contractor (Medical): No Lack of Transportation (Non-Medical): No Housing Stability: Low Risk (11/15/2023) Housing Stability Vital Sign Unable to Pay for Housing in the Last Year: No Number of Times Moved in the Last Year: 1 Homeless in the Last Year: No  Review of Systems:  A 10 point review of systems is negative, except for the pertinent positives and negatives detailed in the  HPI.  Vitals:  Vitals: 04/05/24 1408 BP: 119/81 Pulse:  63 SpO2: 97% Weight: 70.9 kg (156 lb 6.4 oz) Height: 167.6 cm (5' 6)  Body mass index is 25.24 kg/m.  Physical Exam:  Physical Exam Vitals and nursing note reviewed. Constitutional: General: in no acute distress. Appearance: Normal appearance. Is not ill-appearing, toxic-appearing or diaphoretic. HENT: Head: Normocephalic and atraumatic. Right Ear: External ear normal. Left Ear: External ear normal. Eyes: General: Right eye: No discharge. Left eye: No discharge. Extraocular Movements: Extraocular movements intact. Pupils: Pupils are equal, round, and reactive to light. Cardiovascular: Rate and Rhythm: Normal rate and regular rhythm. Pulses: Normal pulses. Heart sounds: Normal heart sounds. No murmur heard. No friction rub. No gallop. Abdominal: General: Bowel sounds are normal. Skin: General: Skin is warm and dry. Capillary Refill: Capillary refill takes less than 2 seconds. Neurological: Mental Status: Patient is alert.  Lab and Imaging Results:  Results LABS Blood glucose: 160-180 mg/dL  DIAGNOSTIC Pulmonary function test: Total lung capacity 90%, diffusion capacity 78%, FEV1 78% (09/26/2023) Procedure Note Complete Pulmonary Function Test  Referring Physician Parris Manna, MD  Reason for PFT  COPD OSA  SPIROMETRY: FVC was 2.71 L, 84 % of predicted FEV1 was 1.84 L, 78 % of predicted FEV1/FVC ratio was 91 % of predicted FEF 25-75% liters per second was 54 % of predicted  LUNG VOLUMES: TLC was 91 % of predicted RV was 114 % of predicted  DIFFUSION CAPACITY: DLCO was 78 % of predicted DLCO/VA was 117 % of predicted   Good patient effort with good repeatability.   Assessment and Plan:  Diagnoses and all orders for this visit:  Chronic obstructive pulmonary disease, unspecified COPD type (CMS/HHS-HCC)  OSA on CPAP  Peripheral edema  Assessment & Plan Chronic obstructive  pulmonary disease COPD is managed with current treatment. Total lung capacity is 90%, diffusion capacity is 78%, and FEV1 is 78%. No need for aggressive intervention at this time. - Continue current inhaler regimen with two puffs every morning. - Will repeat breathing test in six months.  Obstructive sleep apnea Reports increased snoring but does not use CPAP. Uses melatonin for sleep. No restless leg syndrome reported. - Continue melatonin for sleep.  Anemia Recent blood work shows improvement.  Peripheral edema Improved with current diuretic regimen. Weight loss has contributed to improvement. - Continue diuretic regimen on Monday, Wednesday, and Friday.  I spent 41 minutes in both face-to-face and non-face-to-face activities including reviewing history, performing an exam and evaluation, entering clinical information EHR, interpreting results, counseling patient/family/caregiver, reviewing x-rays/CT scans/MRI/labs/echocardiography/PFTs, ordering meds/tests/procedures, referring and communicating with consulting healthcare professionals and care coordination. This does not include time spent with staff.  The patient and/or family voices understanding of the plans. All questions and concerns were answered. The patient and /or family was instructed to call if the patient needs to be seen sooner. Thank you for allowing me to participate in the care of this patient. Do not hesitate to contact me via Epic or by calling our office at [phone] with any questions or concerns.  This note has been created using dictation software tool and any typographical errors are purely unintentional.  Patient received an After Visit Summary   Electronically signed by Parris Manna, MD at 04/08/2024 9:00 AM EST  Reason for Visit

## 2024-05-30 NOTE — Progress Notes (Signed)
 Multidisciplinary Oncology Council Documentation  Bradley [name] was presented by our [facility] on 05/30/2024, which included representatives from:  Palliative Care Dietitian  Physical/Occupational Therapist Nurse Navigator Genetics Social work Survivorship RN Financial Navigator Research RN   Bradley [name] currently presents with history of head and neck cancer  We reviewed previous medical and familial history, history of present illness, and recent lab results along with all available histopathologic and imaging studies. The MOC considered available treatment options and made the following recommendations/referrals:  SW, nutrition  The MOC is a meeting of clinicians from various specialty areas who evaluate and discuss patients for whom a multidisciplinary approach is being considered. Final determinations in the plan of care are those of the provider(s).   Today's extended care, comprehensive team conference, Bradley [name] was not present for the discussion and was not examined.

## 2024-05-30 NOTE — Progress Notes (Signed)
 Last Neurology office note:  Bradley Mickie Fairly, MD Physician Specialty: Neurology   Progress Notes    Signed   Encounter Date: 04/26/2024 Note Received: 05/29/2024 12:02 PM   Signed     Ref Provider: Fairly Jannett Bong*  PCP: Bradley Jerona Ruts, MD  Assessment and Plan:  In most patients we give written parts of assessment and plan to patient under Patient Instructions/After Visit Summary. So some parts are directed to patient.   Dear Mr. Bradley [name], It was our pleasure to participate in your care. We have typed up brief summary of what we discussed. Assessment & Plan Parkinson's disease with cognitive impairment and mood instability Cognitive impairment and mood instability are present, with increased fatigue and anxiety, possibly related to recent cancer diagnosis.   2. Right cervical lymphadenopathy and ulcerated left base of tongue mass, suspected malignancy, pending biopsy Large, vertically fixed mass in the right cervical region with ulceration at the left base of the tongue. Suspected malignancy. Biopsy scheduled for further evaluation. Radiation therapy anticipated regardless of biopsy results due to tumor location.   - Proceed with scheduled ultrasound-guided biopsy of the cervical mass.   3. Headache likely secondary to mass effect or sphenoid sinus disease Headaches located behind the eyes, worse in the morning, possibly due to mass effect on jugular vein or sphenoid sinus opacification. Excedrin provides partial relief. MRI of the brain planned to rule out metastasis or other intracranial pathology.   - Ordered MRI of the brain without contrast to evaluate for metastasis or other intracranial pathology.             MRI BRAIN with and without contrast for headache in a patient with recent diagnosis of tongue cancer. - Prescribed Methocarbamol 500 mg by mouth two times a day as needed - [facility]   4. Sphenoid sinus opacification Noted on imaging,  contributing to headache symptoms. ENT evaluation prioritized for more urgent issues.   - Continue current management with Flonase for sinus symptoms.   5. Age-related macular degeneration with current visual impairment Visual impairment with decreased vision in the right eye and significant impairment in the left eye. Headaches may exacerbate visual symptoms.   6. Sleep disturbance with insomnia, nightmares, and fatigue Increased fatigue and sleep disturbances, possibly related to anxiety and recent cancer diagnosis. Methocarbamol considered for sleep management.   - Prescribed methocarbamol to aid with sleep and headache management.   Follow-up as scheduled. Return in about 5 months (around 09/11/2024) for with Bradley Caffaro, PA-C.  This note has been created using automated tools and reviewed for accuracy by [facility]. This was an urgent visit, requiring modification of my schedule, due to recent hospitalization. Interim History date 04/26/2024    Mr. Bradley [name] is a 89 y.o. male here for treatment and evaluation of Headache, accompanied by loved one.   Mr. Pruden last visit was on 04/11/2024   Patient presenting for worsening headaches that started around 03/2024. Headaches are located behind his eyes bilaterally. Does note cloudy vision in the morning but has history of macular degeneration. Headaches are worse in the morning, start to go away, and then come back later in the day. He has tried Excedrin which does help some. He also reports more fatigue.    Per loved one he is also having more anxiousness and more vivid dreams but thinks this may be related to recent cancer diagnosis.   Recent [place] [facility] notes have been placed in box.    History of Present  Illness Bradley [name] is an 89 year old male with Parkinson's disease and cognitive impairment who presents with headaches and right cervical lymphadenopathy. He is accompanied by his caregiver. He was referred  by an ENT specialist for evaluation of right cervical lymphadenopathy.   Cephalalgia - Worsening headaches since November 2025 - Pain located behind the eye artery - Headaches more severe in the morning, improve throughout the day, and return in the late afternoon - No relief with Excedrin Migraine - No cloudy vision in the morning   Right cervical lymphadenopathy - Referred by ENT specialist for evaluation of right cervical lymphadenopathy   Visual disturbance - Decline in vision, particularly in the left eye, worsening over the past two months - History of macular degeneration - Vision described as 'fuzzy,' affecting ability to see clearly during the day   Fatigue and sleep disturbance - Increased fatigue and more frequent dreams, associated with recent cancer diagnosis - Caregiver observes more frequent nighttime awakenings and increased daytime tiredness - No nightmares - New onset snoring   Anxiety - Increased anxiety attributed to recent cancer diagnosis   I reviewed labs, imaging, and notes in [employer], [facility], and from outside providers, if available.    Results       Disease Summary:      Mr. Handlin is a right handed 89 y.o. male retiree here for evaluation of Headache   Parkinsonism - in a patient with resting tremors (jaw tremor and bilateral hands) + postural instability + REM behavior disorder - still symptomatic but not particularly bothersome to patient: Patient states he has been having resting tremors in his bilateral hands, worse in left hand, and jaw since around April 2018.  The jaw tremor started around June 2018 which was around the time he had kidney stones and associated pain.  The tremors occur at rest but also affect him when performing tasks such as eating or drinking.  He thinks this started in the left then progressed to the right hand as well.  Tremors came on gradually.  He is unsure if alcohol temporarily helps his tremors.  He drinks  around two caffeinated beverages daily. Wife has noticed a decreased in patient's volume in speech. He has a family history of Parkinson's disease in his sister which developed in her late 59s or early 62s.  No other known Family History of Parkinson's disease.  He has not tried any medication for his tremors.  He denies any history of head injury.  Patient states he is also having issues with his balance and gait.  He has a shuffling gait.  He has feeling of stiffness.  He complains of urinary urgency. Notes he has numbness in his fingertips, though notes he is more concerned with the increased shaking in his hands. More in the left hand, but now the right as well. Right hand dominant. Poor dexterity in bilateral hands.    Mr. Mounsey denied decreased sense of smell, drooling, slowness of the movements, constipation, vivid dreams at night.   Medication: Carbidopa -Levodopa  25/100, Carbidopa -Levodopa  CR, Ropinirole , Rivastigmine (discontinued), Namenda, Lamotrigine    Obstructive sleep apnea: Patient has obstructive sleep apnea. Previously using CPAP CPAP with pressure setting of 7cm H2O, discontinued as obstructive sleep apnea improved with 65 pound weight loss.    REM behavior disorder: Taking Melatonin.   Right leg burning pain and numbness: He complains today of pain behind his right knee that radiates posteriorly to his thighs and buttock area. He describes pain as sharp  shooting. Pain interferes with his walking and balance.   Benign Positional Paroxysmal Vertigo: Patient says dizziness comes and goes and can last all day. Patient can be sitting and he will still be dizzy. Patient says if he stands and leans his head back he will get dizzy. It is easier for patient to walk in the street rather than the side walk. Previously had similar issues in 2015.    Vitamin D  deficiency: taking oral supplements    Cognitive Impairment - concerning for Parkinson's disease dementia: Per wife he has been  having difficulty with performing task that he used to do in the past, such as starting the dryer. Patient states he is 89 years old, so his memory is not going to be the same. Per wife, he has been having new cognitive issues. Per wife she is concerned about his memory, as she is scheduled to have surgery and she will not be mobile to provide assistance when needed.    Patient endorses getting frustrated more easily, he cannot keep track of appointments. 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 He also reports word finding difficulties.    Has restricted license per [facility], re-evaluated yearly continue to monitor for safety concerns while driving- still driving 87/4/76.   SLUMS 04/02/2021: 24/30    MRI Brain 12/30/2016: No specific or reversible explanation for the history. Mild for age cerebral volume loss and chronic microvascular ischemic change   Medications: Namenda    History of closed, non-displaced fracture of 6th,7th,8th ribs, right-side    Patient has history of right knee replacement in 2006: impaired gait since. Uses cane.    Protein Calorie Malnutrition/Unintentional Weight Loss: Sent nutrition consult. Complaint of ongoing chronic weight loss. Poor appetite and fulling full sooner. Wife feels patient nutrition is not appropriate and is concerned for his consumption of potato chips. Patient does report eating fruits, vegetables, etc.    Abdominal distension, hepatomegaly in patient with 70 lb weight loss since 2020, non-painful to palpation, no ascites: CT Abdomen reviewed. Reviewed consult with Dr. Cesar Coe who personally reviewed images and did not feel findings are consistent with a hernia thus no indication for surgery.  Findings were thought to be related to nerve root denervation.  There is no history of spinal surgery.  Question component of Parkinson's disease.  Appreciate general surgery consult   CT Abdomen and Pelvis with and without IV and  Oral Contrast 02/22/2022: Large, broad-based right eccentric ventral hernia, similar in appearance and configuration to prior examination dated 2018. Nonobstructive bilateral nephrolithiasis. Diverticulosis without evidence of acute diverticulitis. Prostatomegaly. Coronary artery disease.    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 Acute intraparenchymal hematoma in the right occipital lobe measuring up to 12 mm with small amount of adjacent subarachnoid hemorrhage. No mass effect or midline shift.  2. No acute fracture or traumatic  listhesis in the cervical spine.  3. Medialized right vocal fold, suggestive of right vocal cord  paresis.    CT Head (Brain) 08/25/2022 - 1. Interval resolution of previously noted right occipital intra-axial hemorrhage and small volume subarachnoid blood. No new hemorrhage. Mild residual hypodensity within the right occipital  pole either reflecting resolving edema versus developing focus of encephalomalacia. 2. Atrophy and chronic small vessel ischemic changes of the white matter.   Mood instability Irritability and emotional changes possibly related to Parkinson's disease. Win use.  Medications: Lamotrigine , Wellbutrin  and Zoloft     Flatulence   Right cervical lymphadenopathy Newly discovered lump in the right side of the neck,  non-painful and mushy upon palpation. Differential diagnosis includes reactive lymphadenopathy due to upper respiratory tract infection or malignancy. Urgent ENT evaluation is warranted to rule out serious conditions.   Headache likely secondary to mass effect or sphenoid sinus disease Headaches located behind the eyes, worse in the morning, possibly due to mass effect on jugular vein or sphenoid sinus opacification. Excedrin provides partial relief. MRI of the brain planned to rule out metastasis or other intracranial pathology.   Patient presenting for worsening headaches that started around 03/2024. Headaches are located behind his eyes bilaterally. Does note cloudy vision in the morning but has history of macular degeneration. Headaches are worse in the morning, start to go away, and then come back later in the day. He has tried Excedrin which does help some. He also reports more fatigue.    Physical Exam    Vitals    Vitals:    04/26/24 1525  BP: 110/52  Pulse: 54  SpO2: 99%  Weight: 73 kg (161 lb)  Height: 167.6 cm (5' 6)  PainSc: 0-No pain      Body mass index is 25.99 kg/m.   (Some of the exam changes noted are from previous clinical observations)   Physical Exam     General Exam Mild miosis bilaterally Bilateral intraocular lenses Upper and lower dentures Pitting edema of bilateral lower extremities Right knee replacement scar Abdominal distension without ascites, displaced to right Hepatomegaly, mild Non-tender abdominal exam without guarding or rebound    Neurological Exam Decreased hearing bilaterally. Patient wearing hearing aids Tremors are present in bilateral hands and jaw, tremors are mostly postural, kinetic in nature. Mild left hand tremor  Some incoordination in bilateral hands Minimal cog wheeling in left hand Right Finger taps: moderate Bradykinesia, moderate  decrement, no motion arrest Left Finger taps: moderate  Bradykinesia, moderate  decrement, no motion arrest Mild cog wheeling in right upper extremity  Right toe taps: severe Bradykinesia, moderate  decrement, no motion arrest Left toe taps: moderate  Bradykinesia, moderate decrement, no motion arrest Left decreased arm swing Right genu valgus deformity. Three point turn, well balanced Right shoulder is higher  Able to stand with assistance from arm Romberg's negative    SLUMS 04/02/2021: 24/30   Medications:       Current Outpatient Medications on File Prior to Visit  Medication Sig Dispense Refill   acetaminophen  (TYLENOL ) 500 MG tablet Take 500 mg by mouth every 6 (six) hours as needed for Pain.       albuterol  MDI, PROVENTIL , VENTOLIN , PROAIR , HFA 90 mcg/actuation inhaler USE 2 INHALATIONS BY MOUTH EVERY 6 HOURS AS NEEDED FOR WHEEZING 54 g 2   ANTIOX #8/OM3/DHA/EPA/LUT/ZEAX (PRESERVISION AREDS 2, OMEGA-3, ORAL) Take by mouth.       aspirin  81 MG EC tablet Take 81 mg by mouth once daily  30 tablet 11   atorvastatin  (LIPITOR) 20 MG tablet TAKE 1 TABLET BY MOUTH ONCE  DAILY 90 tablet 3   BIOTIN ORAL Take by mouth Twice a day       brimonidine -timolol  (COMBIGAN ) 0.2-0.5 % ophthalmic solution 1 drop 2 (two) times daily.       buPROPion  (WELLBUTRIN  XL) 150 MG XL tablet TAKE 1 TABLET BY MOUTH ONCE  DAILY (Patient taking differently: Take 150 mg by mouth every morning) 90 tablet 3   carbidopa -levodopa  (SINEMET  CR) 50-200 mg CR tablet Take 1 tablet by mouth at bedtime 90 tablet 3   carbidopa -levodopa  (SINEMET ) 25-100 mg tablet Take 2 tablets by mouth 4 (four) times daily for 180 days 720 tablet 1   cholecalciferol  (VITAMIN D3) 1000 unit tablet Take by mouth       docusate (COLACE) 100 MG capsule Take 100 mg by mouth 2 (two) times daily       finasteride  (PROSCAR ) 5 mg tablet TAKE 1 TABLET BY MOUTH ONCE  DAILY 90 tablet 3   ipratropium (ATROVENT ) 0.06 % nasal spray USE 2 SPRAYS IN BOTH NOSTRILS  TWICE DAILY AS NEEDED FOR  RHINITIS 75 mL 3   lamoTRIgine (LAMICTAL) 25 MG  tablet Take 1 tablet (25 mg total) by mouth every 12 (twelve) hours 180 tablet 3   melatonin 5 mg Cap Take 10 mg by mouth nightly          metFORMIN (GLUCOPHAGE-XR) 500 MG XR tablet Take 1 tablet (500 mg total) by mouth daily with dinner 90 tablet 3   pantoprazole  (PROTONIX ) 40 MG DR tablet Take 1 tablet (40 mg total) by mouth once daily Am 90 tablet 3   polyethylene glycol (MIRALAX ) powder Take 17 g by mouth once daily Mix in 4-8ounces of fluid prior to taking.       rOPINIRole  (REQUIP ) 0.5 MG tablet Take 1 tablet (0.5 mg total) by mouth 4 (four) times daily 360 tablet 3   sertraline  (ZOLOFT ) 100 MG tablet TAKE 1 TABLET BY MOUTH ONCE  DAILY 90 tablet 3   TORsemide (DEMADEX) 20 MG tablet Take 1 tablet (20 mg total) by mouth every Monday, Wednesday, and Friday (Patient taking differently: Take 10 mg by mouth as directed (edema) Monday- Saturday) 36 tablet 3    No current facility-administered medications on file prior to visit.    Past Medical History:      Past Medical History:  Diagnosis Date   Arthritis     Chronic obstructive pulmonary disease (CMS/HHS-HCC) 09/26/2023   Dementia (CMS-HCC)     Diabetes mellitus type 2, uncomplicated (CMS/HHS-HCC)     GERD (gastroesophageal reflux disease)     Glaucoma (increased eye pressure)     History of stroke Unknown mri showed   Hyperlipidemia     Hypertension     Macular degeneration     Multiple gastric ulcers     Parkinson disease (CMS/HHS-HCC) 12/2016   Sleep apnea 01/2015    Now have cpac machine   Tremor      Past Surgical History:       Past Surgical History:  Procedure Laterality Date   JOINT REPLACEMENT   05/2004    Rich knee replacement   EGD @ [facility]   11/10/2022    (Inpatient) Inpatient EGD with ulcers. Biopsies negative for H pylori. Repeat EGD prn/CTL   BLEPHAROPLASTY       CATARACT EXTRACTION       CHOLECYSTECTOMY       Kidney Stone Removal  59, 1987, 1990   KNEE ARTHROSCOPY        Family History:        Family  History  Problem Relation Name Age of Onset   Cancer Sister Bradley [name]          Lung   Hyperlipidemia (Elevated cholesterol) Sister Bradley [name]     High blood pressure (Hypertension) Sister Bradley [name]     Parkinsonism Sister Bradley [name]     Tremor Sister Bradley [name]     Dementia Sister Bradley [name]     Diabetes Sister Bradley [name]     Cancer Sister Bradley [name]          Lung   Hyperlipidemia (Elevated cholesterol) Sister Bradley [name]     High blood pressure (Hypertension) Sister Bradley [name]     Dementia Sister Bradley [name]     Parkinsonism Sister Bradley [name]     Tremor Sister Bradley [name]     Myocardial Infarction (Heart attack) Father Bradley [name]     High blood pressure (Hypertension) Father Bradley [name]     Stroke Father Bradley [name]     Coronary Artery Disease (Blocked arteries around heart) Father Bradley [name]     Myocardial Infarction (Heart attack) Mother Bradley [name]     Hyperlipidemia (Elevated cholesterol) Mother Bradley [name]     High blood pressure (Hypertension) Mother Bradley [name]     Coronary Artery Disease (Blocked arteries around heart) Mother Bradley [name]     Dementia Mother Bradley [name]     Alzheimer's disease Mother Bradley [name]     Myocardial Infarction (Heart attack) Brother Bradley [name]     Hyperlipidemia (Elevated cholesterol) Brother Bradley [name]     High blood pressure (Hypertension) Brother Bradley [name]     Parkinsonism Brother Bradley [name]      Social History:  Social History         Socioeconomic History   Marital status: Married  Tobacco Use   Smoking status: Former      Current packs/day: 0.00      Types: Cigarettes      Quit date: 11/20/1998      Years since quitting: 25.4   Smokeless tobacco: Never  Vaping Use   Vaping status: Never Used  Substance and Sexual Activity   Alcohol use: Yes      Alcohol/week: 4.0 standard drinks of alcohol      Types: 2 Cans of beer, 2 Shots of liquor per week   Drug use: No   Sexual activity: Not Currently      Partners: Female      Birth control/protection: Surgical, Other-see comments    Social Drivers of Health        Financial Resource Strain: Low Risk  (11/15/2023)     Overall Financial Resource Strain (CARDIA)     Difficulty of Paying Living Expenses: Not hard at all  Food Insecurity: No Food Insecurity (11/15/2023)    Hunger Vital Sign     Worried About Running Out of Food in the Last Year: Never true     Ran Out of Food in the Last Year: Never true  Transportation Needs: No Transportation Needs (11/15/2023)    PRAPARE - Therapist, Art (Medical): No     Lack of Transportation (Non-Medical): No  Housing Stability: Low Risk  (11/15/2023)    Housing Stability Vital Sign     Unable to Pay for Housing in the Last Year: No     Number of Times Moved in the Last Year: 1     Homeless in the Last Year: No    Allergies:  Allergies  Allergen Reactions   Penicillin Rash   Sulfa (Sulfonamide Antibiotics) Hives      As infant   Sulfasalazine Unknown      As infant      Dr. Jannett [name]        Electronically signed by Jannett Mickie Fairly, MD at 05/02/2024  8:37 PM    Office Visit on 04/26/2024 Note shared with patient in the original system   Received From: [facility]

## 2024-05-31 ENCOUNTER — Ambulatory Visit
Admission: RE | Admit: 2024-05-31 | Discharge: 2024-05-31 | Disposition: A | Discharge status: Home / Self Care | Source: Ambulatory Visit | Attending: Radiation Oncology | Admitting: Radiation Oncology

## 2024-05-31 ENCOUNTER — Encounter: Payer: Self-pay | Admitting: Radiation Oncology

## 2024-05-31 ENCOUNTER — Telehealth: Payer: Self-pay

## 2024-05-31 VITALS — BP 125/74 | HR 64 | Resp 16 | Wt 153.0 lb

## 2024-05-31 DIAGNOSIS — Z9221 Personal history of antineoplastic chemotherapy: Secondary | ICD-10-CM | POA: Insufficient documentation

## 2024-05-31 DIAGNOSIS — Z79899 Other long term (current) drug therapy: Secondary | ICD-10-CM | POA: Diagnosis not present

## 2024-05-31 DIAGNOSIS — C099 Malignant neoplasm of tonsil, unspecified: Secondary | ICD-10-CM

## 2024-05-31 DIAGNOSIS — G4730 Sleep apnea, unspecified: Secondary | ICD-10-CM | POA: Diagnosis not present

## 2024-05-31 DIAGNOSIS — K922 Gastrointestinal hemorrhage, unspecified: Secondary | ICD-10-CM | POA: Diagnosis not present

## 2024-05-31 DIAGNOSIS — N400 Enlarged prostate without lower urinary tract symptoms: Secondary | ICD-10-CM | POA: Insufficient documentation

## 2024-05-31 DIAGNOSIS — I10 Essential (primary) hypertension: Secondary | ICD-10-CM | POA: Insufficient documentation

## 2024-05-31 DIAGNOSIS — Z809 Family history of malignant neoplasm, unspecified: Secondary | ICD-10-CM | POA: Diagnosis not present

## 2024-05-31 DIAGNOSIS — Z7982 Long term (current) use of aspirin: Secondary | ICD-10-CM | POA: Insufficient documentation

## 2024-05-31 DIAGNOSIS — R1310 Dysphagia, unspecified: Secondary | ICD-10-CM | POA: Insufficient documentation

## 2024-05-31 DIAGNOSIS — Z87442 Personal history of urinary calculi: Secondary | ICD-10-CM | POA: Diagnosis not present

## 2024-05-31 DIAGNOSIS — Z923 Personal history of irradiation: Secondary | ICD-10-CM | POA: Diagnosis not present

## 2024-05-31 DIAGNOSIS — J449 Chronic obstructive pulmonary disease, unspecified: Secondary | ICD-10-CM | POA: Insufficient documentation

## 2024-05-31 DIAGNOSIS — Z87891 Personal history of nicotine dependence: Secondary | ICD-10-CM | POA: Insufficient documentation

## 2024-05-31 DIAGNOSIS — E119 Type 2 diabetes mellitus without complications: Secondary | ICD-10-CM | POA: Diagnosis not present

## 2024-05-31 DIAGNOSIS — G20A1 Parkinson's disease without dyskinesia, without mention of fluctuations: Secondary | ICD-10-CM | POA: Insufficient documentation

## 2024-05-31 DIAGNOSIS — Z8711 Personal history of peptic ulcer disease: Secondary | ICD-10-CM | POA: Diagnosis not present

## 2024-05-31 DIAGNOSIS — Z7984 Long term (current) use of oral hypoglycemic drugs: Secondary | ICD-10-CM | POA: Diagnosis not present

## 2024-05-31 DIAGNOSIS — K219 Gastro-esophageal reflux disease without esophagitis: Secondary | ICD-10-CM | POA: Diagnosis not present

## 2024-05-31 DIAGNOSIS — E785 Hyperlipidemia, unspecified: Secondary | ICD-10-CM | POA: Insufficient documentation

## 2024-05-31 DIAGNOSIS — C01 Malignant neoplasm of base of tongue: Secondary | ICD-10-CM | POA: Diagnosis present

## 2024-05-31 NOTE — Consult Note (Signed)
 " NEW PATIENT EVALUATION  Name: Bradley [name]  MRN: [id number]  Date:   05/31/2024     DOB: 19-May-1936   This 89 y.o. 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   PREVIOUS INVESTIGATIONS:  CT scans MRI of brain PET scan all reviewed Clinical notes reviewed Pathology report reviewed  HPI: Patient is a 89 year old male who presented with a right neck node at a neurology appointment for his Parkinson's disease.  Initial CT scan of his neck showed soft tissue mass centered at the left base of tongue extension to the floor of mouth left lateral pharyngeal sidewall and contralateral right base of tongue.  He also pathologic bilateral level 2 lymphadenopathy.  Brain MRI showed no acute findings.  He underwent a core biopsy of a right neck node showing metastatic squamous cell carcinoma moderate to poorly differentiated p16 positive.  PET scan demonstrated hypermetabolic uptake in the inferior oropharynx along the left floor of mouth extension up to the posterior left nasopharyngeal region compatible with known primary malignancy.  There are hypermetabolic bilateral level 2 and level 3 cervical nodes compatible with metastatic disease.  No evidence of distant hypermetabolic metastatic disease was noted.  He is seen today for evaluation.  He states he is having more pain and dysphagia over the past several days.  He is slated tomorrow to have a port placed as well as a feeding tube.  PLANNED TREATMENT REGIMEN: Concurrent chemoradiation  PAST MEDICAL HISTORY:  has a past medical history of Anemia, BPH (benign prostatic hypertrophy), COPD (chronic obstructive pulmonary disease) (HCC), Diabetes type 2,  controlled (HCC), GERD (gastroesophageal reflux disease), GI bleed, Glaucoma, Hearing loss, History of kidney stones, HLD (hyperlipidemia), HTN (hypertension), Macular degeneration, Neuromuscular disorder (HCC), Osteoarthritis, Parkinson disease (HCC), PUD (peptic ulcer disease) (03/2013), Sleep apnea, and Tremor.    PAST SURGICAL HISTORY:  Past Surgical History:  Procedure Laterality Date   BELPHAROPTOSIS REPAIR  2012   BIOPSY  11/10/2022   Procedure: BIOPSY;  Surgeon: Maryruth Ole DASEN, MD;  Location: [facility];  Service: Endoscopy;;   carotid US   12/2011   no significant stenosis   CATARACT EXTRACTION Bilateral 2011, 2012   CHOLECYSTECTOMY  1997   COLONOSCOPY  2009   per patient, told no longer needed   CYSTOSCOPY W/ URETERAL STENT PLACEMENT Left 11/01/2016   Procedure: CYSTOSCOPY WITH STENT REPLACEMENT;  Surgeon: Penne Knee, MD;  Location: [facility];  Service: Urology;  Laterality: Left;   CYSTOSCOPY WITH STENT PLACEMENT Left 10/26/2016   Procedure: CYSTOSCOPY WITH STENT PLACEMENT;  Surgeon: Penne Knee, MD;  Location: [facility];  Service: Urology;  Laterality: Left;   ESOPHAGOGASTRODUODENOSCOPY  04/2013   hospitalization @ [facility], gastritis, gastric ulcers and duodenal ulcer with clean base (Rein)   ESOPHAGOGASTRODUODENOSCOPY (EGD) WITH PROPOFOL  N/A 11/10/2022   Procedure: ESOPHAGOGASTRODUODENOSCOPY (EGD) WITH PROPOFOL ;  Surgeon: Maryruth Ole DASEN, MD;  Location: [facility];  Service: Endoscopy;  Laterality: N/A;   HOLEP-LASER ENUCLEATION OF THE PROSTATE WITH MORCELLATION N/A 11/01/2016   Procedure: HOLEP-LASER ENUCLEATION OF THE PROSTATE WITH MORCELLATION;  Surgeon: Penne Knee, MD;  Location: [facility];  Service: Urology;  Laterality: N/A;   KIDNEY STONE SURGERY  85, 89, 89   TOTAL KNEE  ARTHROPLASTY Right 2006   URETEROSCOPY Left 10/26/2016   Procedure: URETEROSCOPY;  Surgeon: Penne Knee, MD;  Location: [facility];  Service: Urology;  Laterality: Left;   URETEROSCOPY  WITH HOLMIUM LASER LITHOTRIPSY Left 11/01/2016   Procedure: URETEROSCOPY WITH HOLMIUM LASER LITHOTRIPSY;  Surgeon: Penne Knee, MD;  Location: [facility];  Service: Urology;  Laterality: Left;   US  ECHOCARDIOGRAPHY  12/2011   nl LV fxn, EF 60%, nl valves, dilated aortic root (40mm)    FAMILY HISTORY: family history includes CAD in his father; CAD (age of onset: 50) in his brother; Cancer in his sister and sister; Dementia in his mother.  SOCIAL HISTORY:  reports that he quit smoking about 25 years ago. His smoking use included cigarettes and cigars. He started smoking about 26 years ago. He has a 45 pack-year smoking history. He has never used smokeless tobacco. He reports that he does not currently use alcohol. He reports that he does not use drugs.  ALLERGIES: Sulfa antibiotics and Penicillins  MEDICATIONS:  Current Outpatient Medications  Medication Sig Dispense Refill   acetaminophen  (TYLENOL ) 500 MG tablet Take 1,000 mg by mouth daily as needed for headache, fever, moderate pain or mild pain.     albuterol  (VENTOLIN  HFA) 108 (90 Base) MCG/ACT inhaler Inhale 1-2 puffs into the lungs every 4 (four) hours as needed for shortness of breath or wheezing.     aspirin  EC (ASPIRIN  81) 81 MG tablet Take 1 tablet (81 mg total) by mouth daily. 30 tablet 12   atorvastatin  (LIPITOR) 20 MG tablet Take 20 mg by mouth every evening.      Biotin w/ Vitamins C & E (HAIR/SKIN/NAILS PO) Take 1,666 mg by mouth 2 (two) times daily.     brimonidine -timolol  (COMBIGAN ) 0.2-0.5 % ophthalmic solution Place 1 drop into both eyes 2 (two) times daily.      buPROPion  (WELLBUTRIN  XL) 150 MG 24 hr tablet Take 150 mg by mouth every morning.     carbidopa -levodopa  (SINEMET  CR) 50-200 MG tablet Take 1 tablet by mouth at bedtime.     carbidopa -levodopa  (SINEMET  IR) 25-100 MG tablet Take 2 tablets by mouth 4 (four) times daily. Extended release Taken at 0730, 1130, 1500 and 1830     Cholecalciferol  (VITAMIN D3) 1000 units  CAPS Take 1,000 Units by mouth 2 (two) times daily.     docusate sodium  (COLACE) 100 MG capsule Take 100 mg by mouth 2 (two) times daily.     finasteride  (PROSCAR ) 5 MG tablet Take 5 mg by mouth every morning.      fluticasone (FLONASE) 50 MCG/ACT nasal spray Place 1 spray into both nostrils daily.     HYDROcodone -acetaminophen  (NORCO/VICODIN) 5-325 MG tablet Take 1 tablet by mouth every 6 (six) hours as needed for moderate pain (pain score 4-6). 30 tablet 0   ipratropium (ATROVENT ) 0.06 % nasal spray Place 1 spray into both nostrils 2 (two) times daily as needed for rhinitis.     lamoTRIgine (LAMICTAL) 25 MG tablet Take 25 mg by mouth 2 (two) times daily.     lidocaine -prilocaine  (EMLA ) cream Apply 1 Application topically as needed. 30 g 0   Melatonin 10 MG TABS Take 10 mg by mouth at bedtime.     metFORMIN (GLUCOPHAGE-XR) 500 MG 24 hr tablet Take 500 mg by mouth 2 (two) times daily between meals as needed.     Multiple Vitamins-Minerals (PRESERVISION AREDS 2 PO) Take 1 tablet by mouth 2 (two) times daily.     ondansetron  (ZOFRAN )  8 MG tablet Take 1 tablet (8 mg total) by mouth every 8 (eight) hours as needed for nausea or vomiting. 20 tablet 0   pantoprazole  (PROTONIX ) 40 MG tablet Take 1 tablet (40 mg total) by mouth 2 (two) times daily. 120 tablet 0   polyethylene glycol powder (GLYCOLAX /MIRALAX ) 17 GM/SCOOP powder Take 17 g by mouth daily.     prochlorperazine  (COMPAZINE ) 10 MG tablet Take 1 tablet (10 mg total) by mouth every 6 (six) hours as needed for nausea or vomiting. 30 tablet 0   rOPINIRole  (REQUIP ) 0.5 MG tablet Take 0.5 mg by mouth QID. 8 am, 1130, 1500, 1830     sertraline  (ZOLOFT ) 100 MG tablet Take 100 mg by mouth daily.     torsemide (DEMADEX) 20 MG tablet Take 10 mg by mouth daily. Takes M-Sat am     No current facility-administered medications for this encounter.    ECOG PERFORMANCE STATUS:  1 - Symptomatic but completely ambulatory  REVIEW OF SYSTEMS: Patient has  Parkinson's disease. Patient denies any weight loss, fatigue, weakness, fever, chills or night sweats. Patient denies any loss of vision, blurred vision. Patient denies any ringing  of the ears or hearing loss. No irregular heartbeat. Patient denies heart murmur or history of fainting. Patient denies any chest pain or pain radiating to her upper extremities. Patient denies any shortness of breath, difficulty breathing at night, cough or hemoptysis. Patient denies any swelling in the lower legs. Patient denies any nausea vomiting, vomiting of blood, or coffee ground material in the vomitus. Patient denies any stomach pain. Patient states has had normal bowel movements no significant constipation or diarrhea. Patient denies any dysuria, hematuria or significant nocturia. Patient denies any problems walking, swelling in the joints or loss of balance. Patient denies any skin changes, loss of hair or loss of weight. Patient denies any excessive worrying or anxiety or significant depression. Patient denies any problems with insomnia. Patient denies excessive thirst, polyuria, polydipsia. Patient denies any swollen glands, patient denies easy bruising or easy bleeding. Patient denies any recent infections, allergies or URI. Patient s visual fields have not changed significantly in recent time.   PHYSICAL EXAM: BP 125/74   Pulse 64   Resp 16   Wt 153 lb (69.4 kg)   BMI 24.69 kg/m  Patient has trismus does seem to be a right posterior oral pharyngeal lesion.  Does have significant adenopathy bilateral his head and neck more significant on his right upper cervical chain.  Well-developed well-nourished patient in NAD. HEENT reveals PERLA, EOMI, discs not visualized.  Oral cavity is clear. No oral mucosal lesions are identified. Neck is clear without evidence of cervical or supraclavicular adenopathy. Lungs are clear to A&P. Cardiac examination is essentially unremarkable with regular rate and rhythm without murmur  rub or thrill. Abdomen is benign with no organomegaly or masses noted. Motor sensory and DTR levels are equal and symmetric in the upper and lower extremities. Cranial nerves II through XII are grossly intact. Proprioception is intact. No peripheral adenopathy or edema is identified. No motor or sensory levels are noted. Crude visual fields are within normal range.  LABORATORY DATA: Pathology reports reviewed    RADIOLOGY RESULTS: CT scans MRI scans PET scan all reviewed compatible with above-stated findings   IMPRESSION: Stage IVa p16 positive squamous carcinoma of the base of tongue in 89 year old male  PLAN: At this time I have recommended concurrent chemoradiation therapy.  Would plan on delivering 86 Gray to areas of PET positivity  not only in his oropharynx but his neck nodes.  Would treat the remainder of his bilateral neck nodes to 54 Gray using IMRT treatment planning and delivery.  Risks and benefits of treatment including oral mucositis possible dysphagia loss of taste dryness of the mouth skin reaction alteration of blood counts all were described in detail to the patient and his wife.  They both comprehend my treatment plan well.  Simulation appointment was given.  We will coordinate his chemotherapy with medical oncology.  There will be extra effort by both professional staff as well as technical staff to coordinate and manage concurrent chemoradiation and ensuing side effects during his treatments.     Marcey Penton, MD         "

## 2024-05-31 NOTE — Telephone Encounter (Signed)
 [facility] Clinical Social Work  Clinical Social Work was referred by medical provider for assessment of psychosocial needs.  Clinical Social Worker attempted to contact patient by phone to offer support and assess for needs.  No answer - left detailed voicemail  - will attempt to reach again.   Lizbeth Sprague, LCSW  Clinical Social Worker [facility]

## 2024-06-01 ENCOUNTER — Other Ambulatory Visit: Payer: Self-pay

## 2024-06-01 ENCOUNTER — Ambulatory Visit: Admitting: Certified Registered"

## 2024-06-01 ENCOUNTER — Encounter
Admission: RE | Disposition: A | Discharge status: Home / Self Care | Payer: Self-pay | Source: Home / Self Care | Attending: General Surgery

## 2024-06-01 ENCOUNTER — Ambulatory Visit

## 2024-06-01 ENCOUNTER — Ambulatory Visit
Admission: RE | Admit: 2024-06-01 | Discharge: 2024-06-01 | Disposition: A | Discharge status: Home / Self Care | Attending: General Surgery | Admitting: General Surgery

## 2024-06-01 ENCOUNTER — Encounter: Payer: Self-pay | Admitting: General Surgery

## 2024-06-01 DIAGNOSIS — Z8249 Family history of ischemic heart disease and other diseases of the circulatory system: Secondary | ICD-10-CM | POA: Insufficient documentation

## 2024-06-01 DIAGNOSIS — K219 Gastro-esophageal reflux disease without esophagitis: Secondary | ICD-10-CM | POA: Insufficient documentation

## 2024-06-01 DIAGNOSIS — G709 Myoneural disorder, unspecified: Secondary | ICD-10-CM | POA: Insufficient documentation

## 2024-06-01 DIAGNOSIS — Z8711 Personal history of peptic ulcer disease: Secondary | ICD-10-CM | POA: Insufficient documentation

## 2024-06-01 DIAGNOSIS — C099 Malignant neoplasm of tonsil, unspecified: Secondary | ICD-10-CM | POA: Diagnosis present

## 2024-06-01 DIAGNOSIS — Z7984 Long term (current) use of oral hypoglycemic drugs: Secondary | ICD-10-CM | POA: Insufficient documentation

## 2024-06-01 DIAGNOSIS — G20A1 Parkinson's disease without dyskinesia, without mention of fluctuations: Secondary | ICD-10-CM | POA: Insufficient documentation

## 2024-06-01 DIAGNOSIS — Z87891 Personal history of nicotine dependence: Secondary | ICD-10-CM | POA: Insufficient documentation

## 2024-06-01 DIAGNOSIS — E119 Type 2 diabetes mellitus without complications: Secondary | ICD-10-CM | POA: Diagnosis not present

## 2024-06-01 DIAGNOSIS — E785 Hyperlipidemia, unspecified: Secondary | ICD-10-CM

## 2024-06-01 DIAGNOSIS — Z833 Family history of diabetes mellitus: Secondary | ICD-10-CM | POA: Insufficient documentation

## 2024-06-01 DIAGNOSIS — G4730 Sleep apnea, unspecified: Secondary | ICD-10-CM | POA: Insufficient documentation

## 2024-06-01 DIAGNOSIS — R1310 Dysphagia, unspecified: Secondary | ICD-10-CM | POA: Insufficient documentation

## 2024-06-01 DIAGNOSIS — Z79899 Other long term (current) drug therapy: Secondary | ICD-10-CM | POA: Diagnosis not present

## 2024-06-01 DIAGNOSIS — J449 Chronic obstructive pulmonary disease, unspecified: Secondary | ICD-10-CM | POA: Insufficient documentation

## 2024-06-01 DIAGNOSIS — N289 Disorder of kidney and ureter, unspecified: Secondary | ICD-10-CM | POA: Diagnosis not present

## 2024-06-01 DIAGNOSIS — I10 Essential (primary) hypertension: Secondary | ICD-10-CM | POA: Insufficient documentation

## 2024-06-01 DIAGNOSIS — Z01812 Encounter for preprocedural laboratory examination: Secondary | ICD-10-CM

## 2024-06-01 HISTORY — PX: PORTACATH PLACEMENT: SHX2246

## 2024-06-01 HISTORY — PX: CREATION, GASTROSTOMY, OPEN: SHX7546

## 2024-06-01 LAB — GLUCOSE, CAPILLARY
Glucose-Capillary: 138 mg/dL — ABNORMAL HIGH (ref 70–99)
Glucose-Capillary: 154 mg/dL — ABNORMAL HIGH (ref 70–99)

## 2024-06-01 SURGERY — INSERTION, TUNNELED CENTRAL VENOUS DEVICE, WITH PORT
Anesthesia: General | Site: Chest

## 2024-06-01 MED ORDER — ACETAMINOPHEN 10 MG/ML IV SOLN
INTRAVENOUS | Status: AC
  Filled 2024-06-01: qty 100

## 2024-06-01 MED ORDER — SODIUM CHLORIDE (PF) 0.9 % IJ SOLN
INTRAMUSCULAR | Status: AC
  Filled 2024-06-01: qty 50

## 2024-06-01 MED ORDER — ROCURONIUM BROMIDE 100 MG/10ML IV SOLN
INTRAVENOUS | Status: DC | PRN
  Administered 2024-06-01: 40 mg via INTRAVENOUS

## 2024-06-01 MED ORDER — DROPERIDOL 2.5 MG/ML IJ SOLN: 0.6250 mg | Freq: Once | INTRAMUSCULAR | Status: DC | PRN

## 2024-06-01 MED ORDER — SUGAMMADEX SODIUM 200 MG/2ML IV SOLN
INTRAVENOUS | Status: DC | PRN
  Administered 2024-06-01: 140 mg via INTRAVENOUS

## 2024-06-01 MED ORDER — ACETAMINOPHEN 10 MG/ML IV SOLN: 1000.0000 mg | Freq: Once | INTRAVENOUS | Status: DC | PRN

## 2024-06-01 MED ORDER — SODIUM CHLORIDE 0.9 % IV SOLN
INTRAVENOUS | Status: DC | PRN
  Administered 2024-06-01: 10 mL via INTRAMUSCULAR

## 2024-06-01 MED ORDER — OXYCODONE HCL 5 MG PO TABS
5.0000 mg | ORAL_TABLET | Freq: Once | ORAL | Status: AC | PRN
  Administered 2024-06-01: 5 mg via ORAL

## 2024-06-01 MED ORDER — FENTANYL CITRATE (PF) 100 MCG/2ML IJ SOLN
INTRAMUSCULAR | Status: AC
  Filled 2024-06-01: qty 2

## 2024-06-01 MED ORDER — PROPOFOL 10 MG/ML IV BOLUS
INTRAVENOUS | Status: AC
  Filled 2024-06-01: qty 20

## 2024-06-01 MED ORDER — OXYCODONE HCL 5 MG PO TABS
ORAL_TABLET | ORAL | Status: AC
  Filled 2024-06-01: qty 1

## 2024-06-01 MED ORDER — BUPIVACAINE-EPINEPHRINE (PF) 0.25% -1:200000 IJ SOLN
INTRAMUSCULAR | Status: AC
  Filled 2024-06-01: qty 30

## 2024-06-01 MED ORDER — ORAL CARE MOUTH RINSE: 15.0000 mL | Freq: Once | OROMUCOSAL | Status: AC

## 2024-06-01 MED ORDER — TRAMADOL HCL 50 MG PO TABS
50.0000 mg | ORAL_TABLET | Freq: Four times a day (QID) | ORAL | 0 refills | Status: AC | PRN
  Filled 2024-06-01: qty 10, 3d supply, fill #0

## 2024-06-01 MED ORDER — CHLORHEXIDINE GLUCONATE 0.12 % MT SOLN
OROMUCOSAL | Status: AC
  Filled 2024-06-01: qty 15

## 2024-06-01 MED ORDER — CEFAZOLIN SODIUM-DEXTROSE 2-4 GM/100ML-% IV SOLN
2.0000 g | INTRAVENOUS | Status: AC
  Administered 2024-06-01: 2 g via INTRAVENOUS

## 2024-06-01 MED ORDER — DEXAMETHASONE SOD PHOSPHATE PF 10 MG/ML IJ SOLN
INTRAMUSCULAR | Status: DC | PRN
  Administered 2024-06-01: 5 mg via INTRAVENOUS

## 2024-06-01 MED ORDER — FENTANYL CITRATE (PF) 100 MCG/2ML IJ SOLN
25.0000 ug | INTRAMUSCULAR | Status: DC | PRN
  Administered 2024-06-01 (×2): 25 ug via INTRAVENOUS

## 2024-06-01 MED ORDER — SODIUM CHLORIDE 0.9 % IV SOLN: INTRAVENOUS | Status: DC | PRN

## 2024-06-01 MED ORDER — OXYCODONE HCL 5 MG/5ML PO SOLN: 5.0000 mg | Freq: Once | ORAL | Status: AC | PRN

## 2024-06-01 MED ORDER — PROPOFOL 10 MG/ML IV BOLUS
INTRAVENOUS | Status: DC | PRN
  Administered 2024-06-01: 50 mg via INTRAVENOUS
  Administered 2024-06-01: 40 mg via INTRAVENOUS
  Administered 2024-06-01: 20 mg via INTRAVENOUS
  Administered 2024-06-01: 110 mg via INTRAVENOUS

## 2024-06-01 MED ORDER — ONDANSETRON HCL 4 MG/2ML IJ SOLN
INTRAMUSCULAR | Status: AC
  Filled 2024-06-01: qty 2

## 2024-06-01 MED ORDER — HEPARIN SODIUM (PORCINE) 5000 UNIT/ML IJ SOLN
INTRAMUSCULAR | Status: AC
  Filled 2024-06-01: qty 1

## 2024-06-01 MED ORDER — CHLORHEXIDINE GLUCONATE 0.12 % MT SOLN
15.0000 mL | Freq: Once | OROMUCOSAL | Status: AC
  Administered 2024-06-01: 15 mL via OROMUCOSAL

## 2024-06-01 MED ORDER — LIDOCAINE HCL (PF) 2 % IJ SOLN
INTRAMUSCULAR | Status: AC
  Filled 2024-06-01: qty 5

## 2024-06-01 MED ORDER — LACTATED RINGERS IV SOLN: INTRAVENOUS | Status: DC

## 2024-06-01 MED ORDER — DEXAMETHASONE SOD PHOSPHATE PF 10 MG/ML IJ SOLN
INTRAMUSCULAR | Status: AC
  Filled 2024-06-01: qty 1

## 2024-06-01 MED ORDER — FENTANYL CITRATE (PF) 100 MCG/2ML IJ SOLN
INTRAMUSCULAR | Status: DC | PRN
  Administered 2024-06-01: 25 ug via INTRAVENOUS
  Administered 2024-06-01: 50 ug via INTRAVENOUS
  Administered 2024-06-01: 25 ug via INTRAVENOUS

## 2024-06-01 MED ORDER — ACETAMINOPHEN 10 MG/ML IV SOLN
INTRAVENOUS | Status: DC | PRN
  Administered 2024-06-01: 1000 mg via INTRAVENOUS

## 2024-06-01 MED ORDER — STERILE WATER FOR IRRIGATION IR SOLN
Status: DC | PRN
  Administered 2024-06-01: 10 mL

## 2024-06-01 MED ORDER — SUCCINYLCHOLINE CHLORIDE 200 MG/10ML IV SOSY
PREFILLED_SYRINGE | INTRAVENOUS | Status: DC | PRN
  Administered 2024-06-01: 120 mg via INTRAVENOUS

## 2024-06-01 MED ORDER — BUPIVACAINE-EPINEPHRINE (PF) 0.25% -1:200000 IJ SOLN
INTRAMUSCULAR | Status: DC | PRN
  Administered 2024-06-01: 6 mL via PERINEURAL
  Administered 2024-06-01: 24 mL via PERINEURAL

## 2024-06-01 MED ORDER — PROPOFOL 1000 MG/100ML IV EMUL
INTRAVENOUS | Status: AC
  Filled 2024-06-01: qty 100

## 2024-06-01 MED ORDER — CEFAZOLIN SODIUM-DEXTROSE 2-4 GM/100ML-% IV SOLN
INTRAVENOUS | Status: AC
  Filled 2024-06-01: qty 100

## 2024-06-01 MED ORDER — SUCCINYLCHOLINE CHLORIDE 200 MG/10ML IV SOSY
PREFILLED_SYRINGE | INTRAVENOUS | Status: AC
  Filled 2024-06-01: qty 10

## 2024-06-01 MED ORDER — LIDOCAINE HCL (CARDIAC) PF 100 MG/5ML IV SOSY
PREFILLED_SYRINGE | INTRAVENOUS | Status: DC | PRN
  Administered 2024-06-01: 100 mg via INTRAVENOUS

## 2024-06-01 MED ORDER — ONDANSETRON HCL 4 MG/2ML IJ SOLN
INTRAMUSCULAR | Status: DC | PRN
  Administered 2024-06-01: 4 mg via INTRAVENOUS

## 2024-06-01 SURGICAL SUPPLY — 42 items
BAG DECANTER FOR FLEXI CONT (MISCELLANEOUS) ×2 IMPLANT
BINDER ABD UNIV 12 45-62 (WOUND CARE) IMPLANT
BLADE SURG 15 STRL LF DISP TIS (BLADE) ×2 IMPLANT
BLADE SURG SZ11 CARB STEEL (BLADE) ×2 IMPLANT
CHLORAPREP W/TINT 26 (MISCELLANEOUS) IMPLANT
CLAMP SUTURE YELLOW 5 PAIRS (MISCELLANEOUS) ×2 IMPLANT
DERMABOND ADVANCED .7 DNX12 (GAUZE/BANDAGES/DRESSINGS) ×2 IMPLANT
DRAPE C-ARM XRAY 36X54 (DRAPES) ×2 IMPLANT
DRAPE LAPAROTOMY 100X77 ABD (DRAPES) ×2 IMPLANT
DRSG OPSITE POSTOP 3X4 (GAUZE/BANDAGES/DRESSINGS) ×2 IMPLANT
ELECTRODE REM PT RTRN 9FT ADLT (ELECTROSURGICAL) ×2 IMPLANT
G-TUBE MIC 18FR ENFIT ADLT (TUBING) IMPLANT
G-TUBE MIC 22FR ENFIT ADLT (TUBING) IMPLANT
G-TUBE MIC ADLT 16FR ENFIT (TUBING) IMPLANT
GLOVE BIO SURGEON STRL SZ 6.5 (GLOVE) ×2 IMPLANT
GLOVE BIOGEL PI IND STRL 6.5 (GLOVE) ×2 IMPLANT
GLOVE SURG SYN 6.5 PF PI (GLOVE) ×4 IMPLANT
GOWN STRL REUS W/ TWL LRG LVL3 (GOWN DISPOSABLE) ×6 IMPLANT
IV 0.9% NACL 500 ML (IV SOLUTION) ×2 IMPLANT
KIT PORT INFUSION SMART 8FR (Port) ×2 IMPLANT
KIT TURNOVER KIT A (KITS) ×2 IMPLANT
LABEL OR SOLS (LABEL) ×2 IMPLANT
MANIFOLD NEPTUNE II (INSTRUMENTS) ×2 IMPLANT
NEEDLE FILTER BLUNT 18X1 1/2 (NEEDLE) ×2 IMPLANT
NEEDLE HYPO 22X1.5 SAFETY MO (MISCELLANEOUS) ×2 IMPLANT
PACK BASIN MAJOR ARMC (MISCELLANEOUS) ×2 IMPLANT
PACK PORT-A-CATH (MISCELLANEOUS) ×2 IMPLANT
SOLN 0.9% NACL POUR BTL 1000ML (IV SOLUTION) ×2 IMPLANT
SOLN STERILE WATER 500 ML (IV SOLUTION) ×2 IMPLANT
SPONGE DRAIN TRACH 4X4 STRL 2S (GAUZE/BANDAGES/DRESSINGS) ×2 IMPLANT
SPONGE T-LAP 18X18 ~~LOC~~+RFID (SPONGE) IMPLANT
SUT MNCRL AB 4-0 PS2 18 (SUTURE) ×2 IMPLANT
SUT PDS AB 0 CT1 27 (SUTURE) IMPLANT
SUT PROLENE 2 0 FS (SUTURE) ×2 IMPLANT
SUT PROLENE 2 0 SH DA (SUTURE) ×2 IMPLANT
SUT SILK 2 0 SH (SUTURE) ×4 IMPLANT
SUT VIC AB 3-0 SH 27X BRD (SUTURE) ×2 IMPLANT
SUTURE MNCRL 4-0 27XMF (SUTURE) ×2 IMPLANT
SYR 10ML LL (SYRINGE) ×4 IMPLANT
SYR 3ML LL SCALE MARK (SYRINGE) ×2 IMPLANT
TAPE CLOTH SURG 4X10 WHT LF (GAUZE/BANDAGES/DRESSINGS) ×2 IMPLANT
TRAP FLUID SMOKE EVACUATOR (MISCELLANEOUS) ×2 IMPLANT

## 2024-06-01 NOTE — Op Note (Signed)
 Preoperative diagnosis:  Dysphagia due to Head and neck malignancy with need for prolonged enteral nutrition.   Postoperative diagnosis: Same.   Procedure: Stamm gastrostomy.  Anesthesia: GETA  Surgeon: Dr. Rodolph, MD  Wound Classification: Clean Contaminated  Indications: Patient is a 89 y.o. male required prolonged enteral nutrition because of  dysphagia due to head and neck malignancy. Stamm gastrostomy was chosen as the route of nutritional support.   Findings: 1. Gastrostomy 22 Fr in place 2. Normal gastric anatomy 3. Adequate hemostasis  Description of procedure: The patient was placed in the supine position and general endotracheal anesthesia was induced. A time-out was completed verifying correct patient, procedure, site, positioning, and implant(s) and/or special equipment prior to beginning this procedure. Preoperative antibiotics were given. The abdomen was prepped and draped in the usual sterile fashion. A short upper midline incision was made and deepened through the subcutaneous tissues with electrocautery. Hemostasis was assured. The linea alba was incised and the peritoneal cavity entered.  The stomach was identified and a location on the anterior wall near the greater curvature was selected. That site was approximated to the chosen exit site and found to reach without tension. A small incision was made and the 22-French Gastrostome was passed through the anterior abdominal wall and into the field.  A purse-string suture of 3-0 silk was placed on the anterior surface of the stomach and a gastrotomy was made with electrocautery in the center of the suture. The catheter was inserted into the lumen of the stomach. The purse-string suture was secured in place in such a manner as to inkwell the stomach around the catheter. A second, outer concentric pursestring was placed in a similar manner and tied to further inkwell the stomach.  The stomach was then tacked to the anterior  abdominal wall at the catheter entrance site with several with the same silk sutures used for the purse-string in such a manner as to prevent leakage or torsion. The catheter was secured to the skin with Prolene.  Hemostasis was checked and omentum was brought adjacent to the surgical field. The fascia was closed with a running suture of Vicryl 0.  The skin was closed with subcuticular sutures of Monocryl 3-0.   The patient tolerated the procedure well and was taken to the postanesthesia care unit in stable condition  Specimen: None  Complications: None  EBL: 5 mL

## 2024-06-01 NOTE — Interval H&P Note (Signed)
 History and Physical Interval Note:  06/01/2024 2:19 PM  Bradley [name]  has presented today for surgery, with the diagnosis of C76.0 cancer of head and neck.  The various methods of treatment have been discussed with the patient and family. After consideration of risks, benefits and other options for treatment, the patient has consented to  Procedures: INSERTION, TUNNELED CENTRAL VENOUS DEVICE, WITH PORT (N/A) CREATION, GASTROSTOMY, OPEN (N/A) as a surgical intervention.  The patient's history has been reviewed, patient examined, no change in status, stable for surgery.  I have reviewed the patient's chart and labs.  Questions were answered to the patient's satisfaction.     Lucas Sjogren

## 2024-06-01 NOTE — Anesthesia Procedure Notes (Signed)
 Procedure Name: Intubation Date/Time: 06/01/2024 12:31 PM  Performed by: Jackye Spanner, CRNAPre-anesthesia Checklist: Patient identified, Patient being monitored, Timeout performed, Emergency Drugs available and Suction available Patient Re-evaluated:Patient Re-evaluated prior to induction Oxygen Delivery Method: Circle system utilized Preoxygenation: Pre-oxygenation with 100% oxygen Induction Type: IV induction Laryngoscope Size: 3 and McGrath Grade View: Grade I Tube type: Oral Tube size: 7.0 mm Number of attempts: 1 Airway Equipment and Method: Stylet Placement Confirmation: ETT inserted through vocal cords under direct vision, positive ETCO2 and breath sounds checked- equal and bilateral Secured at: 22 cm Tube secured with: Tape Dental Injury: Teeth and Oropharynx as per pre-operative assessment  Comments: Grade 1 view with Mcgrath 3 blade, and BURP. Noticeable excessive tissue surrounding vallecula, but does not obscure view of cords and glottic opening.

## 2024-06-01 NOTE — Anesthesia Preprocedure Evaluation (Signed)
"                                    Anesthesia Evaluation  Patient identified by MRN, date of birth, ID band Patient awake    Reviewed: Allergy & Precautions, H&P , NPO status , Patient's Chart, lab work & pertinent test results, reviewed documented beta blocker date and time   Airway Mallampati: II  TM Distance: >3 FB Neck ROM: full    Dental  (+) Teeth Intact   Pulmonary sleep apnea , COPD, former smoker   Pulmonary exam normal        Cardiovascular Exercise Tolerance: Poor hypertension, On Medications negative cardio ROS Normal cardiovascular exam Rate:Normal     Neuro/Psych  Neuromuscular disease  negative psych ROS   GI/Hepatic Neg liver ROS, PUD,GERD  ,,  Endo/Other  negative endocrine ROSdiabetes    Renal/GU Renal disease  negative genitourinary   Musculoskeletal   Abdominal   Peds  Hematology  (+) Blood dyscrasia, anemia   Anesthesia Other Findings   Reproductive/Obstetrics negative OB ROS                              Anesthesia Physical Anesthesia Plan  ASA: 3  Anesthesia Plan: General LMA   Post-op Pain Management:    Induction:   PONV Risk Score and Plan:   Airway Management Planned:   Additional Equipment:   Intra-op Plan:   Post-operative Plan:   Informed Consent: I have reviewed the patients History and Physical, chart, labs and discussed the procedure including the risks, benefits and alternatives for the proposed anesthesia with the patient or authorized representative who has indicated his/her understanding and acceptance.       Plan Discussed with: CRNA  Anesthesia Plan Comments:         Anesthesia Quick Evaluation  "

## 2024-06-01 NOTE — Transfer of Care (Signed)
 Immediate Anesthesia Transfer of Care Note  Patient: Bradley [name]  Procedure(s) Performed: INSERTION, TUNNELED CENTRAL VENOUS DEVICE, WITH PORT (Chest) CREATION, GASTROSTOMY, OPEN (Abdomen)  Patient Location: PACU  Anesthesia Type:General  Level of Consciousness: awake, drowsy, and patient cooperative  Airway & Oxygen Therapy: Patient Spontanous Breathing and Patient connected to face mask oxygen  Post-op Assessment: Report given to RN and Post -op Vital signs reviewed and stable  Post vital signs: Reviewed and stable  Last Vitals:  Vitals Value Taken Time  BP 153/84 06/01/24 14:15  Temp    Pulse 59 06/01/24 14:19  Resp 15 06/01/24 14:19  SpO2 100 % 06/01/24 14:19  Vitals shown include unfiled device data.  Last Pain:  Vitals:   06/01/24 1037  TempSrc: Temporal  PainSc: 2          Complications: No notable events documented.

## 2024-06-01 NOTE — Discharge Instructions (Addendum)

## 2024-06-01 NOTE — Op Note (Signed)
"      SURGICAL PROCEDURE REPORT  DATE OF PROCEDURE: 06/01/2024   SURGEON: Dr. Cesar Coe   ANESTHESIA: Local with light IV sedation   PRE-OPERATIVE DIAGNOSIS: Advanced head and neck cancer requiring durable central venous access for chemotherapy   POST-OPERATIVE DIAGNOSIS: Same  PROCEDURE(S): (cpt: 36561) 1.) Percutaneous access of Right internal jugular vein under ultrasound guidance 2.) Insertion of tunneled Right internal jugular central venous catheter with subcutaneous port  INTRAOPERATIVE FINDINGS: Patent easily compressible Right internal jugular vein with appropriate respiratory variations and well-secured tunneled central venous catheter with subcutaneous port at completion of the procedure  ESTIMATED BLOOD LOSS: Minimal (<20 mL)   SPECIMENS: None   IMPLANTS: 34F tunneled Bard PowerPort central venous catheter with subcutaneous port  DRAINS: None   COMPLICATIONS: None apparent   CONDITION AT COMPLETION: Hemodynamically stable, awake   DISPOSITION: PACU   INDICATION(S) FOR PROCEDURE:  Patient is a 89 y.o. male who presented with advanced head and neck cancer requiring durable central venous access for chemotherapy. All risks, benefits, and alternatives to above elective procedures were discussed with the patient, who elected to proceed, and informed consent was accordingly obtained at that time.  DETAILS OF PROCEDURE:  Patient was brought to the operative suite and appropriately identified. In Trendelenburg position, Right IJ venous access site was prepped and draped in the usual sterile fashion, and following a brief timeout, percutaneous Right IJ venous access was obtained under ultrasound guidance using Seldinger technique, by which local anesthetic was injected over the Right IJ vein, and access needle was inserted under direct ultrasound visualization into the Right IJ vein, through which soft guidewire was advanced, over which access needle was withdrawn. Guidewire was  secured, attention was directed to injection of local anesthetic along the planned tunnel site, 2-3 cm transverse Right chest incision was made and confirmed to accommodate the subcutaneous port, and flushed catheter was tunneled retrograde from the port site over the Right chest to the Right IJ access site with the attached port well-secured to the catheter and within the subcutaneous pocket. Insertion sheath was advanced over the guidewire, which was withdrawn along with the insertion sheath dilator. The catheter was introduced through the sheath and left on the Atrio [place] junction under fluoro guidance and catheter cut to desire lenght. Catheter connected to port and fixed to the pocket on two side to avoid twisting. Port was confirmed to withdraw blood and flush easily, after which concentrated heparin  was instilled into the port and catheter. Dermis at the subcutaneous pocket was re-approximated using buried interrupted 3-0 Vicryl suture, and 4-0 Monocryl suture was used to re-approximate skin at the insertion and subcutaneous port sites in running subcuticular fashion for the subcutaneous port and buried interrupted fashion for the insertion site. Skin was cleaned, dried, and sterile skin glue was applied. Patient was then safely transferred to PACU for a chest x-ray. Ultrasound images are available on paper chart and Fluoroscopy guidance images are available in Epic.   "

## 2024-06-04 ENCOUNTER — Other Ambulatory Visit: Payer: Self-pay | Admitting: *Deleted

## 2024-06-04 ENCOUNTER — Inpatient Hospital Stay

## 2024-06-04 ENCOUNTER — Inpatient Hospital Stay: Admitting: Hospice and Palliative Medicine

## 2024-06-04 ENCOUNTER — Telehealth: Payer: Self-pay | Admitting: *Deleted

## 2024-06-04 ENCOUNTER — Encounter: Payer: Self-pay | Admitting: General Surgery

## 2024-06-04 VITALS — BP 126/79 | HR 72 | Resp 18 | Ht 66.0 in | Wt 158.0 lb

## 2024-06-04 DIAGNOSIS — C099 Malignant neoplasm of tonsil, unspecified: Secondary | ICD-10-CM

## 2024-06-04 LAB — CBC WITH DIFFERENTIAL (CANCER CENTER ONLY)
Abs Immature Granulocytes: 0.02 K/uL (ref 0.00–0.07)
Basophils Absolute: 0 K/uL (ref 0.0–0.1)
Basophils Relative: 0 %
Eosinophils Absolute: 0.2 K/uL (ref 0.0–0.5)
Eosinophils Relative: 2 %
HCT: 37.2 % — ABNORMAL LOW (ref 39.0–52.0)
Hemoglobin: 12.3 g/dL — ABNORMAL LOW (ref 13.0–17.0)
Immature Granulocytes: 0 %
Lymphocytes Relative: 21 %
Lymphs Abs: 1.5 K/uL (ref 0.7–4.0)
MCH: 31.7 pg (ref 26.0–34.0)
MCHC: 33.1 g/dL (ref 30.0–36.0)
MCV: 95.9 fL (ref 80.0–100.0)
Monocytes Absolute: 0.6 K/uL (ref 0.1–1.0)
Monocytes Relative: 9 %
Neutro Abs: 4.8 K/uL (ref 1.7–7.7)
Neutrophils Relative %: 68 %
Platelet Count: 193 K/uL (ref 150–400)
RBC: 3.88 MIL/uL — ABNORMAL LOW (ref 4.22–5.81)
RDW: 13.6 % (ref 11.5–15.5)
WBC Count: 7.1 K/uL (ref 4.0–10.5)
nRBC: 0 % (ref 0.0–0.2)

## 2024-06-04 LAB — CMP (CANCER CENTER ONLY)
ALT: 7 U/L (ref 0–44)
AST: 27 U/L (ref 15–41)
Albumin: 4 g/dL (ref 3.5–5.0)
Alkaline Phosphatase: 85 U/L (ref 38–126)
Anion gap: 11 (ref 5–15)
BUN: 26 mg/dL — ABNORMAL HIGH (ref 8–23)
CO2: 26 mmol/L (ref 22–32)
Calcium: 9.5 mg/dL (ref 8.9–10.3)
Chloride: 97 mmol/L — ABNORMAL LOW (ref 98–111)
Creatinine: 0.82 mg/dL (ref 0.61–1.24)
GFR, Estimated: >60 mL/min
Glucose, Bld: 127 mg/dL — ABNORMAL HIGH (ref 70–99)
Potassium: 3.9 mmol/L (ref 3.5–5.1)
Sodium: 134 mmol/L — ABNORMAL LOW (ref 135–145)
Total Bilirubin: 0.6 mg/dL (ref 0.0–1.2)
Total Protein: 6.9 g/dL (ref 6.5–8.1)

## 2024-06-04 LAB — URIC ACID: Uric Acid, Serum: 3.2 mg/dL — ABNORMAL LOW (ref 3.7–8.6)

## 2024-06-04 LAB — MAGNESIUM: Magnesium: 1.8 mg/dL (ref 1.7–2.4)

## 2024-06-04 LAB — PHOSPHORUS: Phosphorus: 3.4 mg/dL (ref 2.5–4.6)

## 2024-06-04 MED ORDER — HYDROCODONE-ACETAMINOPHEN 5-325 MG PO TABS: 1.0000 | ORAL_TABLET | ORAL | Status: DC | PRN

## 2024-06-04 MED ORDER — SODIUM CHLORIDE 0.9 % IV SOLN
Freq: Once | INTRAVENOUS | Status: DC
  Filled 2024-06-04: qty 250

## 2024-06-04 MED ORDER — MORPHINE SULFATE (PF) 4 MG/ML IV SOLN
1.0000 mg | Freq: Once | INTRAVENOUS | Status: AC
  Administered 2024-06-04: 1 mg via INTRAVENOUS
  Filled 2024-06-04: qty 1

## 2024-06-04 NOTE — Telephone Encounter (Signed)
 Wife left vm on triage my husband has a port placed and feeding tube placed on Friday 1/9. He is experiencing extreme jaw pain. He may have popped a stitch around the feeding tube disc. And there seems to be some funky discharge inside the tube. My concern is that he is having extreme amount pain despite pain medication.  Caller verified using pt's full name and dob prior to discussing PHI. RN Spoke with patient and pt's wife. Patient endorses pain in jaw and constipation. Has concerns about the feeding tube.- see rn assessment below     NURSING SYMPTOM TRIAGE ASSESSMENT & DISPOSITION  Mode of interaction:  Telephone Date of symptom triage interaction:  06/04/2024 Time of symptom triage interaction:  1027 am  Cancer diagnosis:  Tonsillar cancer (HCC)  Oral chemotherapy:  No  Symptom Triage Protocol:  Pain   History of the problem:  Pain  Pain Rating (0-10): Currently 8/10, on average 8/10, at worst 10/10, at best 7/10  Pain Location: Jaw pain r/t to head/neck cancer  Pain Quality: Sharp  Associated Pain Symptoms: None  When Did The Pain Start: Ongoing since dx of mouth cancer; becoming progressively worse.  Is the Pain Constant: Yes  Does the Pain Flare:   No  Does Anything Make the Pain Worse: Yes, talking and eating  Does Anything Make the Pain Better:  No     Assess for Changes in ADLs: no     In the past, what treatments have you tried for your pain: Tramadol  50 mg and taking Tylenol    Did these treatments help: No  Additional commentary:  Inability to eat solids  Wife just picked up Norco picked up today. Patient has not started. - Norco 5-325 mg every 6 hours for pain  I instructed wife to have patient go ahead now and take his norco tablet as prescribed by Josh.      Triage Nurse Guidance  Severity Index  Seek emergency care, call an ambulance immediately for: Signs/symptoms of acute injury, spinal cord compression, pathologic fracture, infection, or  other life threatening problem Sudden onset of severe weakness or unrelenting localized pain Inability to ambulate or decreased sensation in extremities Loss of control of bowel or bladder Chest pain  Seek medical care within two to four hours for: Sudden onset of moderate to severe pain Pain not responsive to current medication regimen Pain that interferes with mobility  Seek medical care within twenty-four hours for: Mild to moderate pain that has been increasing Pain that is interfering with activity or sleep  Follow home care instructions for: Mild to moderate aches and pains Notify the provider if no improvement within twenty-four hours     Patient Instruction  Disclaimer:  Patient specific and [employer] instruction provided verbally and sent through MyChart for active users.    Report the following problems No improvement in pain Pain that does not subside with interventions Other side effects, such as sedation, nausea, or constipation  Seek emergency care immediately if any of the following occur: Excruciating pain Immobility Low back pain associated with loss of bladder or bowel control; bilateral extremity weakness  Teach Back Method used:  Yes     History of the problem:  Constipation    Typical bowel movement:    Date of last BM:  05/31/2024  How does the stool differ in:  Size:  no output since 05/31/2024; large bm Color:   Consistency:    Distinct odor change:  No  Blood in stool:  No  Prior episodes of diarrhea:  No  Stool difficult to pass:  Yes  Associated symptoms:  none  Perineal or rectal discomfort:  No  Precipitating factors:  Anesthesia; Recent surgery for port placement and feeding tube placement on 1/9 Dehydration; tonsil ca; poor po fluid/solid intake due to mouth pain.  Onset:  He has always had issues with constipation but he has not had a bowel movement since Thursday. Patient refusing po fluid intake and miralax  dosing due to mouth pain.    Duration:  Ongoing-since surgery  Relieving factors:  Has used Miralax  in past; patient refusing miralax  today   Chart Review:  Recent platelet count:  216, Date:  05/22/2024  Changes in ADLs:  no      Additional commentary: Used miralax  intermittently this week' Denies any nausea/vomiting   Diet History  Patient diet:  Unable to eat/drink  Food consumption:  Unable to eat/drink solids  Fluid consumption:  Minimal-2 or 3 ounces at time Was able to 2 drinks about 11-18 ounces of protein drink yesterday  Dietary fiber:  no      Additional commentary: Wife is concerned pt is dehydrated. Has not starting using the tube feeding. Was instructed that they are not do use the tube feeding for approximately 2-3 weeks from now. They have no supplies and stated that have not even been taught how to use the tube feeding. He was given an abdominal binder and just a few pieces of gauze and paper tape. Im Concerned that there is funky mucous in the tube and we haven't even used it yet.  Patient educated that discharge and mucous at feeding tube site is normal, especially since they have not flushed it. Per wife, no redness or warmth at tube feed site. Pt denies any abdominal distention, tenderness to abd or cramping.          Triage Nurse Guidance  Signs and Symptoms Action  Severe abdominal pain, swelling, or vomiting Vomiting brown, yellow, or green-bitter tasting emesis Significant rectal bleeding with no history of hemorrhoids or bleeding with the constipation Seek emergency care  No bowel movement in five (5) to seven (7) days, unresponsive to homecare instructions  Recent surgery or injury  History of diverticulitis and fever  Fever for 24-48 hours with unknown cause  Inability to pass gas  Seek urgent care within 24 hours.  Dry, hard stools  Pain with bowel movements  Recent change in stools or bowel habits  Recent change in medications  Recent decrease in activity  Recent  decrease in dietary intake (fiber) and fluids  Follow homecare instructions.  Notify MD if no improvement.      Patient Instruction  Disclaimer:  Patient specific and [employer] instruction provided verbally and sent through MyChart for active users.    Assist the patient in finding acute constipation relief, providing guidance for the use of stool softeners and laxatives as recommended by a provider.   Rectal agents should be avoided in patients with cancer at risk of thrombocytopenia, leukopenia, or mucositis from the cancer or its treatment.   Report the Following Problems:  Persistent or worsening constipation  Ineffective homecare measures  Abdominal pain or cramping  Vomiting  Fever   Seek Emergency Care Immediately if Any of the Following Occurs:  Rectal bleeding  Passing black-tarry stool  Severe abdominal pain and swelling  Vomiting brown, yellow, or green bitter-tasting emesis   Teach Back Method used:  Yes      Provider Consulted  Provider name and credentials: Msg sent to Dr. Rennie, [facility] providers  Provider instruction: Sidra, NP will see patient today for port lab/ smc and IV fluids. (Cbc, metc, mag, phos and uric acid)   Will see if apts with Joli can be moved up tomorrow s/p radiation sims for feeding tube education with patient and pt's wife.   Smc can evaluate g-tube site today.      Nurse Triage Priority:  Urgent (obtain medical orders as indicated with instruction for 8-24 hour or sooner follow-up)   Barriers to Care:  None identified   Nurse Triage Disposition:  In-person follow-up visit:  [facility] visit-port labs, smc-Josh and IV fluids at 130 pm today. RN to send [employer] as well with patient teaching materials  Protocol Source:  Ruthine HERO., & Eldonna CANDIE Pee.). (2019). Telephone triage for oncology nurses (3rd ed.). Oncology Nursing Society.

## 2024-06-04 NOTE — Telephone Encounter (Signed)
 Patient/Wife agreeable to inperson visit today at 130 for port labs. RN instructed patient/wife how to apply the emla  cream (given new port). Spoke with Joli- she will see patient tomorrow after CT sim to do the feeding tube education w/pt & pt's wife. Pt was able to take 1 Norco tablet around 1045 this morning.

## 2024-06-04 NOTE — Patient Instructions (Signed)
 Morphine  Injection What is this medication? MORPHINE  (MOR feen) treats severe pain. It is prescribed when other pain medications have not worked or cannot be tolerated. It works by blocking pain signals in the brain. It belongs to a group of medications called opioids. This medicine may be used for other purposes; ask your health care provider or pharmacist if you have questions. COMMON BRAND NAME(S): Astramorph  PF, Duramorph , Duramorph  PF, Infumorph , MITIGO  What should I tell my care team before I take this medication? They need to know if you have any of these conditions: Bleeding disorder Brain tumor Frequently drink alcohol Head injury Heart disease Low adrenal gland function Lung or breathing disease, such as asthma Seizures Stomach or intestine problems History of substance use disorder Take medications that treat or prevent blood clots Taken an MAOI, such as Marplan, Nardil, or Parnate in the last 14 days Trouble passing urine An unusual or allergic reaction to morphine , other medications, foods, dyes, or preservatives Pregnant or trying to get pregnant Breastfeeding How should I use this medication? This medication is injected into a muscle, vein, or under the skin. It is usually given by your care team in a hospital or clinic setting. It may also be given at home. If you get this medication at home, you will be taught how to prepare and give it. Use exactly as directed. Take it as directed on the prescription label. Do not take it more often than directed. There may be unused or extra doses after you finish your treatment. Talk to your care team if you have questions about your dose. Always look at your medication before using it. Do not use the injection if its color is darker than pale yellow or if it is discolored in any other way. Do not use this medication if it is cloudy, thickened, colored, or has solid particles in it. It is important that you put your used needles and  syringes in a special sharps container. Do not put them in a trash can. If you do not have a sharps container, call your pharmacist or care team to get one. Talk to your care team about the use of this medication in children. Special care may be needed. Overdosage: If you think you have taken too much of this medicine contact a poison control center or emergency room at once. NOTE: This medicine is only for you. Do not share this medicine with others. What if I miss a dose? If you miss a dose, take it as soon as you can. If it is almost time for your next dose, take only that dose. Do not take double or extra doses. What may interact with this medication? Do not take this medication with any of the following: Linezolid MAOIs, such as Marplan, Nardil, and Parnate Methylene blue Samidorphan This medication may interact with the following: Alcohol Antihistamines for allergy, cough, and cold Atropine  Certain medications for anxiety or sleep Certain medications for bladder problems, such as oxybutynin, tolterodine Certain medications for depression, such as amitriptyline, fluoxetine, sertraline, mirtazapine, trazodone Certain medications for migraine headache, such as almotriptan, eletriptan, frovatriptan, naratriptan, rizatriptan, sumatriptan, zolmitriptan Certain medications for nausea or vomiting, such as dolasetron, granisetron, ondansetron , palonosetron  Certain medications for Parkinson disease, such as benztropine, trihexyphenidyl Certain medications for seizures, such as phenobarbital, primidone Certain medications for stomach problems, such as dicyclomine, hyoscyamine Certain medications for travel sickness, such as scopolamine Clopidogrel Diuretics General anesthetics, such as halothane, isoflurane, methoxyflurane, propofol Ipratropium Medications that relax muscles Other opioid medications  for pain or cough Phenothiazines, such as chlorpromazine, mesoridazine, prochlorperazine ,  thioridazine Prasugrel Ticagrelor This list may not describe all possible interactions. Give your health care provider a list of all the medicines, herbs, non-prescription drugs, or dietary supplements you use. Also tell them if you smoke, drink alcohol, or use illegal drugs. Some items may interact with your medicine. What should I watch for while using this medication? Tell your care team if your pain does not go away, if it gets worse, or if you have new or a different type of pain. You may develop tolerance to this medication. Tolerance means that you will need a higher dose of the medication for pain relief. Tolerance is normal and is expected if you take this medication for a long time. Taking this medication with other substances that cause drowsiness, such as alcohol, benzodiazepines, or other opioids can cause serious side effects. Give your care team a list of all medications you use. They will tell you how much medication to take. Do not take more medication than directed. Call emergency services if you have problems breathing or staying awake. Children may be at higher risk for side effects. Stop giving this medication and call emergency services right away if your child has slow or noisy breathing, has confusion, is unusually sleepy, or not able to wake up. Long term use of this medication may cause your brain and body to depend on it. This can happen even when used as directed by your care team. You and your care team will work together to determine how long you will need to take this medication. If your care team wants you to stop this medication, the dose will be slowly lowered over time to reduce the risk of side effects. Naloxone is an emergency medication used for an opioid overdose. An overdose can happen if you take too much of an opioid. It can also happen if an opioid is taken with some other medications or substances such as alcohol. Know the symptoms of an overdose, such as trouble  breathing, unusually tired or sleepy, or not being able to respond or wake up. Make sure to tell caregivers and close contacts where your naloxone is stored. Make sure they know how to use it. After naloxone is given, the person giving it must call emergency services. Naloxone is a temporary treatment. Repeat doses may be needed. This medication may affect your coordination, reaction time, or judgment. Do not drive or operate machinery until you know how this medication affects you. Sit up or stand slowly to reduce the risk of dizzy or fainting spells. Drinking alcohol with this medication can increase the risk of these side effects. This medication will cause constipation. If you do not have a bowel movement for 3 days, call your care team. Your mouth may get dry. Chewing sugarless gum or sucking hard candy and drinking plenty of water may help. Contact your care team if the problem does not go away or is severe. Talk to your care team if you may be pregnant. Prolonged use of this medication during pregnancy can cause temporary withdrawal in a newborn. Talk to your care team before breastfeeding. Changes to your treatment plan may be needed. If you breastfeed while taking this medication, seek medical care right away if you notice the child has slow or noisy breathing, is unusually sleepy or not able to wake up, or is limp. Long-term use of this medication may cause infertility. Talk to your care team if you  are concerned about your fertility. What side effects may I notice from receiving this medication? Side effects that you should report to your care team as soon as possible: Allergic reactions--skin rash, itching, hives, swelling of the face, lips, tongue, or throat CNS depression--slow or shallow breathing, shortness of breath, feeling faint, dizziness, confusion, difficulty staying awake Low adrenal gland function--nausea, vomiting, loss of appetite, unusual weakness or fatigue, dizziness Low  blood pressure--dizziness, feeling faint or lightheaded, blurry vision Side effects that usually do not require medical attention (report to your care team if they continue or are bothersome): Constipation Dizziness Drowsiness Dry mouth Headache Nausea Vomiting This list may not describe all possible side effects. Call your doctor for medical advice about side effects. You may report side effects to FDA at 1-800-FDA-1088. Where should I keep my medication? Keep this medication out of reach of children and pets. Store it out of sight in a safe place. Do not share it with others. Misuse of this medication is dangerous and against the law. Your care team will tell you how to store this medication. Get rid of any unused medication after the expiration date. This medication may cause harm and death if it is taken by other adults, children, or pets. It is important to get rid of the medication as soon as you no longer need it or it is expired. To get rid of this medication: Take the medication to a take-back program. Check your pharmacy or law enforcement to find a location. Follow the steps given to you by your pharmacy. You may be given a pre-paid mail-back envelope or disposal product to safely get rid of your medication. If other options are not available, remove the medication from the container and flush it down the toilet. NOTE: This sheet is a summary. It may not cover all possible information. If you have questions about this medicine, talk to your doctor, pharmacist, or health care provider.  2025 Elsevier/Gold Standard (2023-05-12 00:00:00)

## 2024-06-04 NOTE — Progress Notes (Signed)
 "  Symptom Management Clinic [facility] at [facility] [phone]) [phone] [phone]  Patient Care Team: Diedra Lame, MD as PCP - General (Family Medicine) Rennie Cindy SAUNDERS, MD as Consulting Physician (Oncology) Lenn Aran, MD as Consulting Physician (Radiation Oncology) Martrell Eguia, Fonda SAUNDERS, NP as Nurse Practitioner [facility] and Palliative Medicine)   NAME OF PATIENT: Bradley [name]  [id number]  1936-05-15   DATE OF VISIT: 06/04/2024  REASON FOR CONSULT: Bradley [name] is a 89 y.o. 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   INTERVAL HISTORY: Plan is for chemoradiation.  Patient underwent PEG and port placement on 06/01/2024.  He presents [facility] today for evaluation of pain and poor oral intake.  Patient is having pain in the left side of face.  Has only taken Norco once.  Tolerated it but effects were short-lived.  Denies any neurologic complaints. Denies recent fevers or illnesses. Denies any easy bleeding or bruising. Denies chest pain. Denies any nausea, vomiting, constipation, or diarrhea. Denies urinary complaints. Patient offers no further specific complaints today.   PAST MEDICAL HISTORY: Past Medical History:  Diagnosis Date   Anemia    BPH (benign prostatic hypertrophy)    COPD (chronic obstructive pulmonary disease) (HCC)    Diabetes type 2, controlled (HCC)    GERD (gastroesophageal reflux disease)    RARE   GI bleed    2024   Glaucoma    Syndor   Hearing loss    bilateral hearing aids occasionally   History of kidney stones    HLD (hyperlipidemia)    HTN (hypertension)    Macular degeneration    Right (Appenseler)   Neuromuscular disorder (HCC)    Osteoarthritis    thumbs, hips, knees (s/p R TKR)   Parkinson disease (HCC)    PUD (peptic ulcer disease) 03/2013   2 antral gastric ulcers and 1  duodenal ulcer, NSAID related, admitted to [facility] with melena/anemia s/p EGD   Sleep apnea    CPAP   Tremor    intention    PAST SURGICAL HISTORY:  Past Surgical History:  Procedure Laterality Date   BELPHAROPTOSIS REPAIR  2012   BIOPSY  11/10/2022   Procedure: BIOPSY;  Surgeon: Maryruth Ole DASEN, MD;  Location: [facility] ENDOSCOPY;  Service: Endoscopy;;   carotid US   12/2011   no significant stenosis   CATARACT EXTRACTION Bilateral 2011, 2012   CHOLECYSTECTOMY  1997   COLONOSCOPY  2009   per patient, told no longer needed   CREATION, GASTROSTOMY, OPEN N/A 06/01/2024   Procedure: CREATION, GASTROSTOMY, OPEN;  Surgeon: Rodolph Romano, MD;  Location: [facility] ORS;  Service: General;  Laterality: N/A;   CYSTOSCOPY W/ URETERAL STENT PLACEMENT Left 11/01/2016   Procedure: CYSTOSCOPY WITH STENT REPLACEMENT;  Surgeon: Penne Knee, MD;  Location: [facility] ORS;  Service: Urology;  Laterality: Left;   CYSTOSCOPY WITH STENT PLACEMENT Left 10/26/2016   Procedure: CYSTOSCOPY WITH STENT PLACEMENT;  Surgeon: Penne Knee, MD;  Location: [facility] ORS;  Service: Urology;  Laterality: Left;   ESOPHAGOGASTRODUODENOSCOPY  04/2013   hospitalization @ [facility], gastritis, gastric ulcers and duodenal ulcer with clean base (Rein)   ESOPHAGOGASTRODUODENOSCOPY (EGD) WITH PROPOFOL  N/A 11/10/2022   Procedure: ESOPHAGOGASTRODUODENOSCOPY (EGD) WITH PROPOFOL ;  Surgeon: Maryruth Ole DASEN, MD;  Location: [facility];  Service: Endoscopy;  Laterality: N/A;   HOLEP-LASER ENUCLEATION OF THE PROSTATE WITH MORCELLATION N/A 11/01/2016   Procedure: HOLEP-LASER ENUCLEATION OF THE PROSTATE WITH MORCELLATION;  Surgeon: Penne Knee, MD;  Location: [facility] ORS;  Service: Urology;  Laterality: N/A;   KIDNEY STONE SURGERY  33, 49, 90   PORTACATH PLACEMENT N/A 06/01/2024   Procedure: INSERTION, TUNNELED CENTRAL VENOUS DEVICE, WITH PORT;  Surgeon: Rodolph Romano, MD;  Location: [facility] ORS;  Service: General;  Laterality: N/A;   TOTAL  KNEE ARTHROPLASTY Right 2006   URETEROSCOPY Left 10/26/2016   Procedure: URETEROSCOPY;  Surgeon: Penne Knee, MD;  Location: [facility] OR[facility];  Service: Urology;  Laterality: Left;   URETEROSCOPY WITH HOLMIUM LASER LITHOTRIPSY Left 11/01/2016   Procedure: URETEROSCOPY WITH HOLMIUM LASER LITHOTRIPSY;  Surgeon: Penne Knee, MD;  Location: [facility] ORS;  Service: Urology;  Laterality: Left;   US  ECHOCARDIOGRAPHY  12/2011   nl LV fxn, EF 60%, nl valves, dilated aortic root (40mm)    HEMATOLOGY/ONCOLOGY HISTORY:  Oncology History  Tonsillar cancer (HCC)  05/22/2024 Initial Diagnosis   Tonsillar cancer (HCC)   05/22/2024 Cancer Staging   Staging form: Pharynx - Oropharynx (HPV-Associated), AJCC V9 - Clinical: Stage III (cT4, cN2, cM0, p16+) - Signed by Rennie Cindy SAUNDERS, MD on 05/22/2024     ALLERGIES:  is allergic to sulfa antibiotics and penicillins.  MEDICATIONS:  Current Outpatient Medications  Medication Sig Dispense Refill   acetaminophen  (TYLENOL ) 500 MG tablet Take 1,000 mg by mouth daily as needed for headache, fever, moderate pain or mild pain.     albuterol  (VENTOLIN  HFA) 108 (90 Base) MCG/ACT inhaler Inhale 1-2 puffs into the lungs every 4 (four) hours as needed for shortness of breath or wheezing.     aspirin  EC (ASPIRIN  81) 81 MG tablet Take 1 tablet (81 mg total) by mouth daily. 30 tablet 12   atorvastatin  (LIPITOR) 20 MG tablet Take 20 mg by mouth every evening.      Biotin w/ Vitamins C & E (HAIR/SKIN/NAILS PO) Take 1,666 mg by mouth 2 (two) times daily.     brimonidine -timolol  (COMBIGAN ) 0.2-0.5 % ophthalmic solution Place 1 drop into both eyes 2 (two) times daily.      buPROPion  (WELLBUTRIN  XL) 150 MG 24 hr tablet Take 150 mg by mouth every morning.     carbidopa -levodopa  (SINEMET  CR) 50-200 MG tablet Take 1 tablet by mouth at bedtime.     carbidopa -levodopa  (SINEMET  IR) 25-100 MG tablet Take 2 tablets by mouth 4 (four) times daily. Extended release Taken at 0730, 1130,  1500 and 1830     Cholecalciferol  (VITAMIN D3) 1000 units CAPS Take 1,000 Units by mouth 2 (two) times daily.     docusate sodium  (COLACE) 100 MG capsule Take 100 mg by mouth 2 (two) times daily.     finasteride  (PROSCAR ) 5 MG tablet Take 5 mg by mouth every morning.      fluticasone (FLONASE) 50 MCG/ACT nasal spray Place 1 spray into both nostrils daily.     HYDROcodone -acetaminophen  (NORCO/VICODIN) 5-325 MG tablet Take 1 tablet by mouth every 6 (six) hours as needed for moderate pain (pain score 4-6). (Patient not taking: Reported on 06/04/2024) 30 tablet 0   ipratropium (ATROVENT ) 0.06 % nasal spray Place 1 spray into both nostrils 2 (two) times daily as needed for rhinitis.     lamoTRIgine (LAMICTAL) 25 MG tablet Take 25 mg by mouth 2 (two) times daily.     lidocaine -prilocaine  (EMLA ) cream Apply 1 Application topically as needed. 30 g 0   Melatonin 10 MG TABS Take 10 mg by mouth at bedtime.     metFORMIN (GLUCOPHAGE-XR) 500 MG 24 hr tablet Take  500 mg by mouth 2 (two) times daily between meals as needed.     Multiple Vitamins-Minerals (PRESERVISION AREDS 2 PO) Take 1 tablet by mouth 2 (two) times daily.     ondansetron  (ZOFRAN ) 8 MG tablet Take 1 tablet (8 mg total) by mouth every 8 (eight) hours as needed for nausea or vomiting. 20 tablet 0   pantoprazole  (PROTONIX ) 40 MG tablet Take 1 tablet (40 mg total) by mouth 2 (two) times daily. 120 tablet 0   polyethylene glycol powder (GLYCOLAX /MIRALAX ) 17 GM/SCOOP powder Take 17 g by mouth daily.     prochlorperazine  (COMPAZINE ) 10 MG tablet Take 1 tablet (10 mg total) by mouth every 6 (six) hours as needed for nausea or vomiting. 30 tablet 0   rOPINIRole  (REQUIP ) 0.5 MG tablet Take 0.5 mg by mouth QID. 8 am, 1130, 1500, 1830     sertraline  (ZOLOFT ) 100 MG tablet Take 100 mg by mouth daily.     torsemide (DEMADEX) 20 MG tablet Take 10 mg by mouth daily. Takes M-Sat am     traMADol  (ULTRAM ) 50 MG tablet Take 1 tablet (50 mg total) by mouth every 6  (six) hours as needed. 10 tablet 0   No current facility-administered medications for this visit.    VITAL SIGNS: There were no vitals taken for this visit. There were no vitals filed for this visit.  Estimated body mass index is 24.69 kg/m as calculated from the following:   Height as of 06/01/24: 5' 6 (1.676 m).   Weight as of 06/01/24: 153 lb (69.4 kg).  LABS: CBC:    Component Value Date/Time   WBC 6.8 05/22/2024 1543   WBC 8.5 08/25/2023 1112   HGB 13.0 05/22/2024 1543   HGB 9.3 (L) 04/23/2013 1422   HGB 15.3 12/01/2011 0000   HCT 39.8 05/22/2024 1543   HCT 27.5 (L) 04/23/2013 0523   PLT 216 05/22/2024 1543   PLT 164 04/23/2013 0523   MCV 96.6 05/22/2024 1543   MCV 94 04/23/2013 0523   NEUTROABS 4.3 05/22/2024 1543   NEUTROABS 4.1 04/23/2013 0523   LYMPHSABS 1.7 05/22/2024 1543   LYMPHSABS 1.6 04/23/2013 0523   MONOABS 0.5 05/22/2024 1543   MONOABS 0.5 04/23/2013 0523   EOSABS 0.2 05/22/2024 1543   EOSABS 0.2 04/23/2013 0523   BASOSABS 0.0 05/22/2024 1543   BASOSABS 0.0 04/23/2013 0523   Comprehensive Metabolic Panel:    Component Value Date/Time   NA 140 05/22/2024 1543   NA 143 04/23/2013 0523   K 4.3 05/22/2024 1543   K 3.2 (L) 04/23/2013 0523   K 4.2 12/01/2011 0000   CL 102 05/22/2024 1543   CL 109 (H) 04/23/2013 0523   CO2 29 05/22/2024 1543   CO2 29 04/23/2013 0523   BUN 27 (H) 05/22/2024 1543   BUN 13 04/23/2013 0523   CREATININE 0.97 05/22/2024 1543   CREATININE 0.68 04/23/2013 0523   CREATININE 0.79 12/01/2011 0000   GLUCOSE 114 (H) 05/22/2024 1543   GLUCOSE 78 04/23/2013 0523   CALCIUM  9.4 05/22/2024 1543   CALCIUM  7.7 (L) 04/23/2013 0523   AST 27 05/22/2024 1543   ALT <5 05/22/2024 1543   ALT 25 04/23/2013 0523   ALKPHOS 71 05/22/2024 1543   ALKPHOS 43 (L) 04/23/2013 0523   ALKPHOS 53 12/01/2011 0000   BILITOT 0.3 05/22/2024 1543   PROT 6.9 05/22/2024 1543   PROT 5.1 (L) 04/23/2013 0523   ALBUMIN 4.0 05/22/2024 1543   ALBUMIN 2.6 (L)  04/23/2013 9476  RADIOGRAPHIC STUDIES: DG Chest Port 1 View Result Date: 06/01/2024 CLINICAL DATA:  Port-A-Cath placement, head and neck cancer EXAM: PORTABLE CHEST 1 VIEW COMPARISON:  05/16/2024 FINDINGS: Single frontal view of the chest demonstrates right chest wall port via internal jugular approach, tip overlying superior vena cava. Cardiac silhouette is enlarged. No acute airspace disease, effusion, or pneumothorax. No acute bony abnormalities. IMPRESSION: 1. No complication after right chest wall port placement. Electronically Signed   By: Ozell Daring M.D.   On: 06/01/2024 15:56   DG C-Arm 1-60 Min-No Report Result Date: 06/01/2024 Fluoroscopy was utilized by the requesting physician.  No radiographic interpretation.   DG SWALLOW FUNC OP MEDICARE SPEECH PATH Result Date: 05/25/2024 Table formatting from the original result was not included. 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 Since detection, the left neck mass has increased in size, and a new contralateral neck mass developed over the past 1-2 days. He describes the left neck mass as feeling like a rubber ball. Relevant comorbidities include Parkinson's disease with progressive hand and jaw tremors, difficulty with fine motor tasks, cognitive decline, and finger numbness. He is legally blind (vision 20/2000) but remains independent in activities of daily living with assistive devices. He has type 2 diabetes and a remote tobacco use history, having quit in 2000 after a 30-year history.     Per pt and his wife's rpeort, he is currently seeing a speech therapist at [facility] twice a week for cognition. Per chart he also participated in [facility]  (01/08/2019 thru 02/12/2019).     CT Soft Tissue Neck 04/13/2024  There is an irregular soft tissue mass involving the left base of tongue with  effacement of the vallecula and extension along the lingual epiglottic surface.  There is extension deep into the floor of mouth, more pronounced on the left  though also involving the right. In total, the irregular soft tissue mass  measures approximately 4.8 x 4.3 x 4.7 cm (AP x TR x CC) and involves the left  lateral pharyngeal sidewall. There is extension to involve the contralateral  right base of tongue region as well. Clinical Impression: Pt presents with overall adequate oropharyngeal abilities when consuming thin liquids, nectar thick liquids, puree, graham cracker with barium paste and whole barium tablet with thin liquids. When consuming puree and soft solids, pt was observed grimacing with report of increased pain to 8 out of 10 therefore longer more deliberate handling of the boluses orally was observed. No aspiration or penetration was observed. Extensive education provided on the results of this study, aspiration precautions, diet recommendation as well as Outpatient ST services targeting perseveration of swallow function during radiation. All questions were answered to pt and his wife's satisfaction. Factors that may increase risk of adverse event in presence of aspiration Noe & Lianne 2021): Factors that may increase risk of adverse event in presence of aspiration Noe & Lianne 2021): Reduced cognitive function (upcoming radiation of HNC cancer, pain with solids, tumor presence) Recommendations/Plan: Swallowing Evaluation Recommendations Swallowing Evaluation Recommendations Recommendations: PO diet PO Diet Recommendation: Regular; Thin liquids (Level 0) Liquid Administration via: Cup; Straw Medication Administration: Whole meds with liquid Supervision: Patient able to self-feed Swallowing strategies  : Minimize environmental distractions; Slow  rate; Small bites/sips Postural  changes: Position pt fully upright for meals; Stay upright 30-60 min after meals Oral care recommendations: Oral care BID (2x/day) Treatment Plan Treatment Plan Treatment recommendations: No treatment recommended at this time Follow-up recommendations: Outpatient SLP (to follow throughout [facility] treatment) Recommendations Recommendations for follow up therapy are one component of a multi-disciplinary discharge planning process, led by the attending physician.  Recommendations may be updated based on patient status, additional functional criteria and insurance authorization. Assessment: Orofacial Exam: Orofacial Exam Oral Cavity: Oral Hygiene: WFL Oral Cavity - Dentition: Dentures, top; Dentures, bottom Orofacial Anatomy: WFL Oral Motor/Sensory Function: WFL (reports increased pain during lingual lateralization) Anatomy: Anatomy: WFL Boluses Administered: Boluses Administered Boluses Administered: Thin liquids (Level 0); Mildly thick liquids (Level 2, nectar thick); Puree; Solid  Oral Impairment Domain: Oral Impairment Domain Lip Closure: No labial escape Tongue control during bolus hold: Cohesive bolus between tongue to palatal seal Bolus preparation/mastication: Timely and efficient chewing and mashing (when consuming puree and soft solids, pt grimacing with report of increased pain to 8 out of 10 therefore longer more deliberate handling of boluses orally) Bolus transport/lingual motion: Brisk tongue motion Oral residue: Trace residue lining oral structures Location of oral residue : Tongue Initiation of pharyngeal swallow : Valleculae  Pharyngeal Impairment Domain: Pharyngeal Impairment Domain Soft palate elevation: No bolus between soft palate (SP)/pharyngeal wall (PW) Laryngeal elevation: Complete superior movement of thyroid cartilage with complete approximation of arytenoids to epiglottic petiole Anterior hyoid excursion: Complete anterior movement Epiglottic movement: Complete  inversion Laryngeal vestibule closure: Complete, no air/contrast in laryngeal vestibule Pharyngeal stripping wave : Present - complete Pharyngoesophageal segment opening: Complete distension and complete duration, no obstruction of flow Tongue base retraction: Trace column of contrast or air between tongue base and PPW Pharyngeal residue: Trace residue within or on pharyngeal structures Location of pharyngeal residue: Pharyngeal wall; Tongue base  Esophageal Impairment Domain: Esophageal Impairment Domain Esophageal clearance upright position: Complete clearance, esophageal coating Pill: Pill Consistency administered: Thin liquids (Level 0) Thin liquids (Level 0): [facility] Penetration/Aspiration Scale Score: Penetration/Aspiration Scale Score 1.  Material does not enter airway: Thin liquids (Level 0); Mildly thick liquids (Level 2, nectar thick); Puree; Solid; Pill Compensatory Strategies: No data recorded  General Information: Caregiver present: Yes  Diet Prior to this Study: Regular; Thin liquids (Level 0)   Temperature : Normal   Respiratory Status: WFL   Supplemental O2: None (Room air)   History of Recent Intubation: No  Behavior/Cognition: Alert; Cooperative; Pleasant mood Self-Feeding Abilities: Able to self-feed Baseline vocal quality/speech: Normal Volitional Cough: Able to elicit Volitional Swallow: Able to elicit Exam Limitations: No limitations Goal Planning: No data recorded No data recorded No data recorded Patient/Family Stated Goal: to presevere swallow function throughout treatment for [facility] Consulted and agree with results and recommendations: Patient; Family member/caregiver; Physician; Nurse Pain: Pain Assessment Pain Assessment: No/denies pain (increaed to 8 out of 10 (posterior oral cavity/base of tongue) when consuming puree and soft solids) End of Session: Start Time:SLP Start Time (ACUTE ONLY): 0930 Stop Time: SLP Stop Time (ACUTE ONLY): 1015 Time Calculation:SLP Time Calculation (min) (ACUTE  ONLY): 45 min Charges: SLP Evaluations $ SLP Speech Visit: 1 Visit SLP Evaluations $MBS Swallow: 1 Procedure $Self Care/Home Management: 8-22 SLP visit diagnosis: SLP Visit Diagnosis: Dysphagia, unspecified (R13.10) Past Medical History: Past Medical History: Diagnosis Date  Anemia   BPH (benign prostatic hypertrophy)   Diabetes type 2, controlled (HCC)   GERD (gastroesophageal reflux disease)   RARE  Glaucoma   Syndor  Hearing loss  bilateral hearing aids occasionally  History of kidney stones   HLD (hyperlipidemia)   HTN (hypertension)   Macular degeneration   Right (Appenseler)  Osteoarthritis   thumbs, hips, knees (s/p R TKR)  PUD (peptic ulcer disease) 03/2013  2 antral gastric ulcers and 1 duodenal ulcer, NSAID related, admitted to [facility] with melena/anemia s/p EGD  Sleep apnea   CPAP  Tremor   intention Past Surgical History: Past Surgical History: Procedure Laterality Date  BELPHAROPTOSIS REPAIR  2012  BIOPSY  11/10/2022  Procedure: BIOPSY;  Surgeon: Maryruth Ole DASEN, MD;  Location: [facility];  Service: Endoscopy;;  carotid US   12/2011  no significant stenosis  CATARACT EXTRACTION Bilateral 2011, 2012  CHOLECYSTECTOMY  1997  COLONOSCOPY  2009  per patient, told no longer needed  CYSTOSCOPY W/ URETERAL STENT PLACEMENT Left 11/01/2016  Procedure: CYSTOSCOPY WITH STENT REPLACEMENT;  Surgeon: Penne Knee, MD;  Location: [facility] ORS;  Service: Urology;  Laterality: Left;  CYSTOSCOPY WITH STENT PLACEMENT Left 10/26/2016  Procedure: CYSTOSCOPY WITH STENT PLACEMENT;  Surgeon: Penne Knee, MD;  Location: [facility] OR[facility];  Service: Urology;  Laterality: Left;  ESOPHAGOGASTRODUODENOSCOPY  04/2013  hospitalization @ [facility], gastritis, gastric ulcers and duodenal ulcer with clean base (Rein)  ESOPHAGOGASTRODUODENOSCOPY (EGD) WITH PROPOFOL  N/A 11/10/2022  Procedure: ESOPHAGOGASTRODUODENOSCOPY (EGD) WITH PROPOFOL ;  Surgeon: Maryruth Ole DASEN, MD;  Location: [facility];  Service: Endoscopy;  Laterality: N/A;  HOLEP-LASER  ENUCLEATION OF THE PROSTATE WITH MORCELLATION N/A 11/01/2016  Procedure: HOLEP-LASER ENUCLEATION OF THE PROSTATE WITH MORCELLATION;  Surgeon: Penne Knee, MD;  Location: [facility] OR[facility];  Service: Urology;  Laterality: N/A;  KIDNEY STONE SURGERY  83, 87, 90  TOTAL KNEE ARTHROPLASTY Right 2006  URETEROSCOPY Left 10/26/2016  Procedure: URETEROSCOPY;  Surgeon: Penne Knee, MD;  Location: [facility] ORS;  Service: Urology;  Laterality: Left;  URETEROSCOPY WITH HOLMIUM LASER LITHOTRIPSY Left 11/01/2016  Procedure: URETEROSCOPY WITH HOLMIUM LASER LITHOTRIPSY;  Surgeon: Penne Knee, MD;  Location: [facility] ORS;  Service: Urology;  Laterality: Left;  US  ECHOCARDIOGRAPHY  12/2011  nl LV fxn, EF 60%, nl valves, dilated aortic root (40mm) Happi Overton 05/25/2024, 4:20 PM CLINICAL DATA:  Patient with dysphagia following head and neck radiation. EXAM: MODIFIED BARIUM SWALLOW TECHNIQUE: Radiologist, not in attendance for the exam. Different consistencies of barium were administered orally to the patient by the Speech Pathologist. Imaging of the pharynx was performed in the lateral projection. The radiology APP, Brittany Huneycutt, NP, was present in the fluoroscopy room for this study, providing personal supervision. FLUOROSCOPY: Radiation Exposure Index (as provided by the fluoroscopic device): 13.2 mGy Kerma COMPARISON:  04/13/24 CT Soft Tissue Neck FINDINGS: Vestibular  Penetration:  None seen. Aspiration:  None seen. Other:  None. IMPRESSION: No vestibular penetration or aspiration seen. Please refer to the Speech Pathologists report for complete details and recommendations. Electronically Signed   By: Wilkie Lent M.D.   On: 05/25/2024 13:02  NM PET Image Initial (PI) Skull Base To Thigh (F-18 FDG) Result Date: 05/16/2024 CLINICAL DATA:  Initial treatment strategy for head neck cancer. EXAM: NUCLEAR MEDICINE PET SKULL BASE TO THIGH TECHNIQUE: 8.4 mCi F-18 FDG was injected intravenously. Full-ring PET imaging was performed from  the skull base to thigh after the radiotracer. CT data was obtained and used for attenuation correction and anatomic localization. Fasting blood glucose: 137 mg/dl COMPARISON:  Neck CT 88/78/7974 FINDINGS: Mediastinal blood pool activity: SUV max 2.4 Liver activity: SUV max NA NECK: Hypermetabolic uptake in the inferior oropharynx along the left floor of mouth is hypermetabolic with  SUV max = 11.7. Small focus of hypermetabolism extends up into the posterior left nasopharyngeal region with SUV max = 4.7. Right-sided level II lymph node measures 2.9 x 2.0 cm today with SUV max = 10.0. Left-sided level II and III lymph nodes demonstrate SUV max up to 10.3. Incidental CT findings: Irregular soft tissue in the left oropharynx and floor mouth was better evaluated on previous neck CT with contrast material. CHEST: No hypermetabolic mediastinal or hilar nodes. No suspicious pulmonary nodules on the CT scan. Incidental CT findings: The heart is enlarged. Coronary artery calcification is evident. Moderate atherosclerotic calcification is noted in the wall of the thoracic aorta. Ascending thoracic aorta measures 4.6 cm diameter. Areas of noncalcified pleural thickening in the right hemithorax show no discernible hypermetabolism. ABDOMEN/PELVIS: No abnormal hypermetabolic activity within the liver, pancreas, adrenal glands, or spleen. No hypermetabolic lymph nodes in the abdomen or pelvis. Incidental CT findings: Cholecystectomy. Relatively diffuse thickening of the gastric wall noted without hypermetabolism. Bilateral nonobstructing nephrolithiasis. There is moderate atherosclerotic calcification of the abdominal aorta without aneurysm. Left colonic diverticulosis without diverticulitis. Prostate gland is markedly enlarged. SKELETON: No focal hypermetabolic activity to suggest skeletal metastasis. Incidental CT findings: No worrisome lytic or sclerotic osseous abnormality. IMPRESSION: 1. Hypermetabolic uptake in the inferior  oropharynx along the left floor of mouth with extension up into the posterior left nasopharyngeal region. Imaging features compatible with known primary neoplasm. 2. Hypermetabolic bilateral level II and left level III cervical lymph nodes compatible with metastatic disease. 3. No evidence for hypermetabolic metastatic disease in the chest, abdomen, or pelvis. 4. Bilateral nonobstructing nephrolithiasis. 5. Left colonic diverticulosis without diverticulitis. 6. Marked prostatomegaly. 7.  Aortic Atherosclerosis (ICD10-I70.0). Electronically Signed   By: Camellia Candle M.D.   On: 05/16/2024 11:09   US  CORE BIOPSY (LYMPH NODES) Result Date: 05/07/2024 INDICATION: 89 year old male with bilateral cervical lymphadenopathy and a mass at the base of the tongue. Findings are concerning for metastatic head and neck cancer. He presents for ultrasound-guided core biopsy to confirm tissue diagnosis. EXAM: ULTRASOUND BIOPSY OF THE LYMPH NODES MEDICATIONS: None. ANESTHESIA/SEDATION: None. FLUOROSCOPY TIME:  None. COMPLICATIONS: None immediate. PROCEDURE: Informed written consent was obtained from the patient after a thorough discussion of the procedural risks, benefits and alternatives. All questions were addressed. Maximal Sterile Barrier Technique was utilized including caps, mask, sterile gowns, sterile gloves, sterile drape, hand hygiene and skin antiseptic. A timeout was performed prior to the initiation of the procedure. The right neck was interrogated with ultrasound. A large irregular lymph node is identified in the upper cervical chain at the site of the palpable abnormality. A skin entry site was selected and marked. Local anesthesia was attained by infiltration with 1% lidocaine . A small dermatotomy was made. Under real-time ultrasound guidance, multiple 18 gauge core biopsies were obtained using the automated biopsy device. Biopsy cores were placed on a moistened Telfa pad and delivered to pathology for further  analysis. Post biopsy imaging demonstrates no evidence of complication. IMPRESSION: Successful ultrasound-guided core biopsy of right upper cervical lymph node. Electronically Signed   By: Wilkie Lent M.D.   On: 05/07/2024 13:36    PERFORMANCE STATUS (ECOG) : 2 - Symptomatic, <50% confined to bed  Review of Systems Unless otherwise noted, a complete review of systems is negative.  Physical Exam General: NAD Cardiovascular: regular rate and rhythm Pulmonary: clear ant fields Abdomen: soft, nontender, + bowel sounds PEG in place without erythema or drainage GU: no suprapubic tenderness Extremities: no edema, no joint deformities  Skin: no rashes Neurological: Weakness but otherwise nonfocal  IMPRESSION/PLAN: Tonsillar cancer -patient pending CT simulation on 06/05/2024 and I will start XRT on 06/14/2024. Follow up with Dr. Rennie  Poor oral intake -patient status post PEG.  Discussed with Dr. Rodolph and okay with use of PEG.  Patient sees nutrition tomorrow.  Mild hyponatremia likely from dehydration.  Proceed with IV fluids today.  Neoplasm related pain -recommended maximizing use of Norco.  Will liberalize frequency to every 4 hours as needed.  Can consider starting a long-acting opioid if needed.  Constipation -recommended bowel regimen with MiraLAX /senna  Case and plan discussed with Dr. Rennie  Patient expressed understanding and was in agreement with this plan. He also understands that He can call clinic at any time with any questions, concerns, or complaints.   Thank you for allowing me to participate in the care of this very pleasant patient.   Time Total: 20 minutes  Visit consisted of counseling and education dealing with the complex and emotionally intense issues of symptom management in the setting of serious illness.Greater than 50%  of this time was spent counseling and coordinating care related to the above assessment and plan.  Signed by: Fonda Mower, PhD, NP-C     "

## 2024-06-05 ENCOUNTER — Other Ambulatory Visit: Payer: Self-pay | Admitting: *Deleted

## 2024-06-05 ENCOUNTER — Inpatient Hospital Stay

## 2024-06-05 ENCOUNTER — Ambulatory Visit
Admission: RE | Admit: 2024-06-05 | Discharge: 2024-06-05 | Disposition: A | Discharge status: Home / Self Care | Source: Ambulatory Visit | Attending: Radiation Oncology | Admitting: Radiation Oncology

## 2024-06-05 DIAGNOSIS — C099 Malignant neoplasm of tonsil, unspecified: Secondary | ICD-10-CM

## 2024-06-05 DIAGNOSIS — Z510 Encounter for antineoplastic radiation therapy: Secondary | ICD-10-CM | POA: Insufficient documentation

## 2024-06-05 DIAGNOSIS — C01 Malignant neoplasm of base of tongue: Secondary | ICD-10-CM | POA: Insufficient documentation

## 2024-06-05 DIAGNOSIS — Z87891 Personal history of nicotine dependence: Secondary | ICD-10-CM | POA: Insufficient documentation

## 2024-06-05 MED ORDER — NUTREN 1.5 EN LIQD: ENTERAL | Status: AC

## 2024-06-05 NOTE — Progress Notes (Cosign Needed Addendum)
 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  Past medical history of PD, legally blind, DM, remote tobacco history, GERD, HLD, HTN, PUD.  Planning concurrent chemotherapy and radiation.   Surgical G-tube placed on 06/01/24 (22 Fr).    Met with patient and wife in clinic.  Reports that appetite has been poor > 1 week due extreme jaw pain/mouth pain with chewing/eating.  Eating mostly bites and sips. Wife reports over the weekend not able to get him to drink fluids. Patient and wife live at [facility].  Reports no bowel movement for the last 4 days. Was told yesterday to increase miralax  and add senna if needed.  Patient was seen by [facility] yesterday and giving IV fluids for dehydration.  Pain medication was increased.   Anticipate long term need of feeding tube with upcoming treatments    Medications: compazine , protonix , zofran , sinemet , Vit D, Colace, metformin   Labs: Na 134, K 3.9, Mag 1.8, phosphorus 3.4   Anthropometrics:   Height: 66 inches Weight: 158 lb on 06/04/24 168 lb on 08/25/23 BMI: 25  6% weight loss in the last 9 months, concerning   Estimated Energy Needs  Kcals: 1800-2160 Protein: 86-108 g Fluid: > 1800 ml   NUTRITION DIAGNOSIS: Inadequate oral intake related to cancer as evidenced by 6% weight loss, extreme jaw pain, eating less than 50% of estimated needs > 5 days    INTERVENTION:  Provided teaching to patient and wife regarding care of feeding tube site, how to give water  flush and provide bolus feeding.  Patient and wife gave a water  flush and carton of formula in clinic in presence of RD.  Written instructions provided Patient will need 6 cartons of nutren 1.5 via bolus feeding (2 cartons at 8am and noon feeding and 1 carton at 4 pm and 8pm feeding).  Flush with 60 ml of water  before and after each feeding.  Needs additional 240 ml TID of water  for better hydration Provides 2250 calories, 102 gm protein, 2346 ml free water . Meets 100% of needs Will start with 3 cartons of formula today and add 1 carton daily until reaches goal rate.   Patient at refeeding risk.  Will have CMP, phosphorus and magnesium  checked again on 1/15 Contact information given and regimen written for wife.  Will contact [employer] for enteral supplies. Tube feeding sample formula and supplies given to wife today. Recommend starting MVI daily Reviewed importance of bowel regimen and will start today.    MONITORING, EVALUATION, GOAL: weight trends, tube feeding   Next Visit: Thursday, Jan 15  Jareb Radoncic B. Dasie SOLON, CSO, UTAH Registered Dietitian [phone]'

## 2024-06-06 ENCOUNTER — Inpatient Hospital Stay: Admitting: Occupational Therapy

## 2024-06-06 DIAGNOSIS — M6281 Muscle weakness (generalized): Secondary | ICD-10-CM

## 2024-06-06 NOTE — Therapy (Signed)
 [facility] - A Dept Of [place]. [facility] [address], [address] [place], [place], [address] Phone: [phone]   Fax:  [phone]  Occupational Therapy Screen  Patient Details  Name: Bradley [name] MRN: [id number] Date of Birth: 1935-07-24 No data recorded  Encounter Date: 06/06/2024   OT End of Session - 06/06/24 1719     Visit Number 0          Past Medical History:  Diagnosis Date   Anemia    BPH (benign prostatic hypertrophy)    COPD (chronic obstructive pulmonary disease) (HCC)    Diabetes type 2, controlled (HCC)    GERD (gastroesophageal reflux disease)    RARE   GI bleed    2024   Glaucoma    [place]   Hearing loss    bilateral hearing aids occasionally   History of kidney stones    HLD (hyperlipidemia)    HTN (hypertension)    Macular degeneration    Right ([place])   Neuromuscular disorder (HCC)    Osteoarthritis    thumbs, hips, knees (s/p R TKR)   Parkinson disease (HCC)    PUD (peptic ulcer disease) 03/2013   2 antral gastric ulcers and 1 duodenal ulcer, NSAID related, admitted to [facility] with melena/anemia s/p EGD   Sleep apnea    CPAP   Tremor    intention    Past Surgical History:  Procedure Laterality Date   BELPHAROPTOSIS REPAIR  2012   BIOPSY  11/10/2022   Procedure: BIOPSY;  Surgeon: Maryruth Ole DASEN, MD;  Location: [facility] ENDOSCOPY;  Service: Endoscopy;;   carotid US   12/2011   no significant stenosis   CATARACT EXTRACTION Bilateral 2011, 2012   CHOLECYSTECTOMY  1997   COLONOSCOPY  2009   per patient, told no longer needed   CREATION, GASTROSTOMY, OPEN N/A 06/01/2024   Procedure: CREATION, GASTROSTOMY, OPEN;  Surgeon: Rodolph Romano, MD;  Location: [facility] ORS;  Service: General;  Laterality: N/A;   CYSTOSCOPY W/ URETERAL STENT PLACEMENT Left 11/01/2016   Procedure: CYSTOSCOPY WITH STENT REPLACEMENT;  Surgeon: Penne Knee, MD;  Location: [facility] ORS;  Service: Urology;   Laterality: Left;   CYSTOSCOPY WITH STENT PLACEMENT Left 10/26/2016   Procedure: CYSTOSCOPY WITH STENT PLACEMENT;  Surgeon: Penne Knee, MD;  Location: [facility] ORS;  Service: Urology;  Laterality: Left;   ESOPHAGOGASTRODUODENOSCOPY  04/2013   hospitalization @ [facility], gastritis, gastric ulcers and duodenal ulcer with clean base ([place])   ESOPHAGOGASTRODUODENOSCOPY (EGD) WITH PROPOFOL  N/A 11/10/2022   Procedure: ESOPHAGOGASTRODUODENOSCOPY (EGD) WITH PROPOFOL ;  Surgeon: Maryruth Ole DASEN, MD;  Location: [facility] ENDOSCOPY;  Service: Endoscopy;  Laterality: N/A;   HOLEP-LASER ENUCLEATION OF THE PROSTATE WITH MORCELLATION N/A 11/01/2016   Procedure: HOLEP-LASER ENUCLEATION OF THE PROSTATE WITH MORCELLATION;  Surgeon: Penne Knee, MD;  Location: [facility] ORS;  Service: Urology;  Laterality: N/A;   KIDNEY STONE SURGERY  79, 68, 90   PORTACATH PLACEMENT N/A 06/01/2024   Procedure: INSERTION, TUNNELED CENTRAL VENOUS DEVICE, WITH PORT;  Surgeon: Rodolph Romano, MD;  Location: [facility] ORS;  Service: General;  Laterality: N/A;   TOTAL KNEE ARTHROPLASTY Right 2006   URETEROSCOPY Left 10/26/2016   Procedure: URETEROSCOPY;  Surgeon: Penne Knee, MD;  Location: [facility] ORS;  Service: Urology;  Laterality: Left;   URETEROSCOPY WITH HOLMIUM LASER LITHOTRIPSY Left 11/01/2016   Procedure: URETEROSCOPY WITH HOLMIUM LASER LITHOTRIPSY;  Surgeon: Penne Knee, MD;  Location: [facility] ORS;  Service: Urology;  Laterality: Left;  US  ECHOCARDIOGRAPHY  12/2011   nl LV fxn, EF 60%, nl valves, dilated aortic root (40mm)    There were no vitals filed for this visit.     06/04/2024 Palliative care-[name] nurse practitioner REASON FOR CONSULT: Bradley [name] is a 89 y.o. 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    INTERVAL HISTORY: Plan is for chemoradiation.  Patient underwent PEG and port placement on  06/01/2024.   He presents [facility] today for evaluation of pain and poor oral intake.   Patient is having pain in the left side of face.  Has only taken Norco once.  Tolerated it but effects were short-lived.   Denies any neurologic complaints. Denies recent fevers or illnesses. Denies any easy bleeding or bruising. Denies chest pain. Denies any nausea, vomiting, constipation, or diarrhea. Denies urinary complaints. Patient offers no further specific complaints today.  IMPRESSION/PLAN: Tonsillar cancer -patient pending CT simulation on 06/05/2024 and I will start XRT on 06/14/2024. Follow up with Dr. Rennie   Poor oral intake -patient status post PEG.  Discussed with Dr. Rodolph and okay with use of PEG.  Patient sees nutrition tomorrow.  Mild hyponatremia likely from dehydration.  Proceed with IV fluids today.   Neoplasm related pain -recommended maximizing use of Norco.  Will liberalize frequency to every 4 hours as needed.  Can consider starting a long-acting opioid if needed.   Constipation -recommended bowel regimen with MiraLAX /senna    OT SCREEN 06/06/24: Patient was referred for OT screen because of Parkinson's, low vision and starting cancer treatment. Wife accompanies patient. Lives at [facility] in [place]. Walk-in shower no seat but railing and hand-held shower Patient report able to walk around with single-point cane Denies any falls wife reports 1 fall Patient did report he finished 2 to 3 weeks ago physical therapy and Occupational Therapy at [facility] He started speech therapy for his cancer treatment and outpatient Patient reports some days his vision is more blurry than other days then has to more depend on his single-point cane for feedback and proprioception. Feels his balance is still about the same.  Has exercise bike but not used it since finishing physical therapy Had a lot of appointments the last few weeks with cancer diagnosis Done a balance screen.   Patient difficulty with sit and stand needs to rely on his hands, cannot perform tandem stand tapping 1 foot on stepstool alternating and standing on 1 leg.  But could be his baseline with Parkinson's. The occupational therapist did address low-vision and modifications to his apartment.  Did discuss with patient and wife that patient needs to maintain his endurance and strength during cancer treatment. Recommend for him to practice  -sit and stand 3 times a day-10 reps can put a pillow in his seat to do hands-free.  Was able to do in the clinic -do his bike 10 to 30 minutes depending on how he feels and how many appointments and treatments he had  -sidestepping at the kitchen counter 1 to 2 minutes daily  Patient and wife to assess as treatment is starting how patient is doing with his vision as well as his balance.  And if you need a referral to physical and Occupational Therapy at [facility] -needs to reach out to oncologist. Patient was able to ambulate from chair into the treatment room and from treatment room to bathroom and back with single-point cane with no issues.  Patient can see structures and doors and people.  Does not writing on the wall or fine detail. Patient can follow-up with me if needed in 3 weeks.    Visit Diagnosis: Muscle weakness (generalized)    Problem List Patient Active Problem List   Diagnosis Date Noted   Tonsillar cancer (HCC) 05/22/2024   DKA (diabetic ketoacidosis) (HCC) 11/09/2022   UGIB (upper gastrointestinal bleed) 11/09/2022   Acute blood loss anemia 11/09/2022   Diabetes mellitus without complication (HCC) 11/09/2022   Leukocytosis 11/09/2022   AKI (acute kidney injury) 11/09/2022   Essential tremor 11/09/2022   Parkinson disease (HCC) 11/09/2022   Cerebral contusion without loss of consciousness (HCC) 08/01/2022   Loss of weight 04/20/2013   Greater trochanteric bursitis of left hip 03/07/2013   Benign prostatic hyperplasia    HTN  (hypertension)    Diabetes type 2, uncontrolled    Osteoarthritis    Glaucoma    HLD (hyperlipidemia)     Ancel Peters, OTR/L,CLT 06/06/2024, 5:21 PM  [facility] - A Dept Of [place]. [facility] [address], [address] [place], [place], [address] Phone: [phone]   Fax:  [phone]  Name: Bradley [name] MRN: [id number] Date of Birth: 11-28-1935

## 2024-06-07 ENCOUNTER — Inpatient Hospital Stay

## 2024-06-07 ENCOUNTER — Encounter: Payer: Self-pay | Admitting: Hospice and Palliative Medicine

## 2024-06-07 ENCOUNTER — Other Ambulatory Visit: Payer: Self-pay | Admitting: *Deleted

## 2024-06-07 DIAGNOSIS — C099 Malignant neoplasm of tonsil, unspecified: Secondary | ICD-10-CM

## 2024-06-07 LAB — CMP (CANCER CENTER ONLY)
ALT: 26 U/L (ref 0–44)
AST: 48 U/L — ABNORMAL HIGH (ref 15–41)
Albumin: 4.1 g/dL (ref 3.5–5.0)
Alkaline Phosphatase: 93 U/L (ref 38–126)
Anion gap: 10 (ref 5–15)
BUN: 37 mg/dL — ABNORMAL HIGH (ref 8–23)
CO2: 31 mmol/L (ref 22–32)
Calcium: 9.6 mg/dL (ref 8.9–10.3)
Chloride: 99 mmol/L (ref 98–111)
Creatinine: 0.92 mg/dL (ref 0.61–1.24)
GFR, Estimated: >60 mL/min
Glucose, Bld: 155 mg/dL — ABNORMAL HIGH (ref 70–99)
Potassium: 4.4 mmol/L (ref 3.5–5.1)
Sodium: 140 mmol/L (ref 135–145)
Total Bilirubin: 0.3 mg/dL (ref 0.0–1.2)
Total Protein: 7.1 g/dL (ref 6.5–8.1)

## 2024-06-07 LAB — MAGNESIUM: Magnesium: 1.6 mg/dL — ABNORMAL LOW (ref 1.7–2.4)

## 2024-06-07 LAB — PHOSPHORUS: Phosphorus: 2.2 mg/dL — ABNORMAL LOW (ref 2.5–4.6)

## 2024-06-07 MED ORDER — HYDROCODONE-ACETAMINOPHEN 5-325 MG PO TABS: 1.0000 | ORAL_TABLET | ORAL | 0 refills | Status: DC | PRN

## 2024-06-07 NOTE — Progress Notes (Signed)
 Nutrition Follow-up:  Surgical G-tube placed on 06/01/24 (22 Fr)  Met with patient and wife in clinic.  Patient reports pain with chewing and swallowing.  He is still taking medications orally.  Wife has been giving tube feeding 3 cartons on Tuesday, 5 cartons yesterday.  Reports today gave him 2 cartons of formula at 7:30am and 11:30am (60ml water  flush first, then formula, then 60ml water , then formula then 60ml water  flush).  Patient reports that he tolerated it well.  Had small bowel movement in the middle of the night last night.  Not taking miralax  BID per wife only agreeable to one dose of it.  Taking pain medication q 4 hours.    Wife reports that she received delivery of enteral supplies from [employer]  She purchased a binder on [employer] that she likes to hold the feeding tube in place  Medications: reviewed  Labs: K 4.4 normal Glucose 155 BUN 37, creatinine 0.92 Phosphorus 2.2 low Magnesium  1.6 low  Anthropometrics:   Weight 158 lb 1/12  Estimated Energy Needs  Kcals: 1800-2160 Protein: 86-108 g Fluid: > 1800 ml  NUTRITION DIAGNOSIS: Inadequate oral intake continues, relying on feeding tube   INTERVENTION:  Discussed electrolyte supplementation with NP (Mag and Phosphorus) Asked wife to not increase tube feeding today  with dip in phosphorus and Mag.  She verbalized understanding.   Repeat labs ordered for 1/19 (CMP, mag, phosphorus).   Oral intake as tolerated Recommend patient to take miralax  BID as previously recommended by NP with pain medication    MONITORING, EVALUATION, GOAL: weight trends, tube feeding tolerance   NEXT VISIT: to be determined  Ari Bernabei B. Dasie SOLON, CSO, LDN Registered Dietitian [phone]

## 2024-06-07 NOTE — Telephone Encounter (Signed)
 Msg from pt's wife- Bradley [name] will run out of  original pain meds sunday.  It was initially prescribed for every 6 but told to move up to 4 hours. Joli today  said rx put in 06/04/24 for every 4hrs but [facility] doesn't have another Rx waiting for Ron. Would you please send in a RX for generic Vicodin 325.mg but at every 4 hours. Thanks Medford Comings, Please advise. Script is showing 'no print' instead of receipt confirmed by pharmacy from 1/12

## 2024-06-08 ENCOUNTER — Other Ambulatory Visit: Payer: Self-pay

## 2024-06-08 ENCOUNTER — Inpatient Hospital Stay

## 2024-06-08 ENCOUNTER — Other Ambulatory Visit: Payer: Self-pay | Admitting: Hospice and Palliative Medicine

## 2024-06-08 ENCOUNTER — Other Ambulatory Visit: Payer: Self-pay | Admitting: *Deleted

## 2024-06-08 MED ORDER — PHOS-NAK 280-160-250 MG PO PACK
1.0000 | PACK | Freq: Three times a day (TID) | ORAL | 0 refills | Status: DC
  Filled 2024-06-08: qty 60, 15d supply, fill #0

## 2024-06-08 MED ORDER — MAGNESIUM OXIDE -MG SUPPLEMENT 400 (240 MG) MG PO TABS
400.0000 mg | ORAL_TABLET | Freq: Every day | ORAL | 0 refills | Status: DC
  Filled 2024-06-08: qty 15, 15d supply, fill #0

## 2024-06-08 NOTE — Progress Notes (Signed)
 Discussed with Joli. Patient at risk for refeeding syndrome. Phos and Mag were low. Initiate supplementation and follow labs.   Discussed with pharmacy.

## 2024-06-08 NOTE — Progress Notes (Signed)
 Nutrition Follow-up:  Spoke with wife via phone this am.  Explained that NP has sent script for patient medication q 4 hours to [facility].  NP has also ordered phosphorus repletion and magnesium  repletion for patient to take over the weekend.  Wife informed that they were sent to [facility] and the reason.  Wife has already received an email.  Wife has pharmacy number and discussed the location.    Reviewed tube feeding regimen again with wife including water  flush.  She knows not to give more than 5 cartons of nutrition until she speaks with RD again. Reports that patient had large BM this am.    Reminded of appointments on Monday, 1/19.   Wife appreciative of call.  Eris Breck B. Dasie SOLON, CSO, LDN Registered Dietitian [phone]

## 2024-06-08 NOTE — Anesthesia Postprocedure Evaluation (Signed)
"   Anesthesia Post Note  Patient: Bradley [name]  Procedure(s) Performed: INSERTION, TUNNELED CENTRAL VENOUS DEVICE, WITH PORT (Chest) CREATION, GASTROSTOMY, OPEN (Abdomen)  Patient location during evaluation: PACU Anesthesia Type: General Level of consciousness: awake and alert Pain management: pain level controlled Vital Signs Assessment: post-procedure vital signs reviewed and stable Respiratory status: spontaneous breathing, nonlabored ventilation, respiratory function stable and patient connected to nasal cannula oxygen Cardiovascular status: blood pressure returned to baseline and stable Postop Assessment: no apparent nausea or vomiting Anesthetic complications: no   No notable events documented.   Last Vitals:  Vitals:   06/01/24 1537 06/01/24 1703  BP: (!) 160/70 (!) 158/70  Pulse: 62   Resp: 16 14  Temp: 36.4 C (!) 36.1 C  SpO2: 95% 97%    Last Pain:  Vitals:   06/01/24 1544  TempSrc:   PainSc: 4                  Lynwood KANDICE Clause      "

## 2024-06-11 ENCOUNTER — Inpatient Hospital Stay

## 2024-06-11 ENCOUNTER — Other Ambulatory Visit: Payer: Self-pay | Admitting: Internal Medicine

## 2024-06-11 ENCOUNTER — Inpatient Hospital Stay: Admitting: Internal Medicine

## 2024-06-11 ENCOUNTER — Encounter: Payer: Self-pay | Admitting: Internal Medicine

## 2024-06-11 VITALS — BP 108/65 | HR 80 | Temp 100.5°F | Ht 66.0 in | Wt 153.0 lb

## 2024-06-11 DIAGNOSIS — C099 Malignant neoplasm of tonsil, unspecified: Secondary | ICD-10-CM

## 2024-06-11 LAB — CMP (CANCER CENTER ONLY)
ALT: 12 U/L (ref 0–44)
AST: 35 U/L (ref 15–41)
Albumin: 3.8 g/dL (ref 3.5–5.0)
Alkaline Phosphatase: 83 U/L (ref 38–126)
Anion gap: 7 (ref 5–15)
BUN: 32 mg/dL — ABNORMAL HIGH (ref 8–23)
CO2: 33 mmol/L — ABNORMAL HIGH (ref 22–32)
Calcium: 9.7 mg/dL (ref 8.9–10.3)
Chloride: 99 mmol/L (ref 98–111)
Creatinine: 0.92 mg/dL (ref 0.61–1.24)
GFR, Estimated: >60 mL/min
Glucose, Bld: 310 mg/dL — ABNORMAL HIGH (ref 70–99)
Potassium: 4.5 mmol/L (ref 3.5–5.1)
Sodium: 139 mmol/L (ref 135–145)
Total Bilirubin: 0.4 mg/dL (ref 0.0–1.2)
Total Protein: 6.9 g/dL (ref 6.5–8.1)

## 2024-06-11 LAB — MAGNESIUM: Magnesium: 2 mg/dL (ref 1.7–2.4)

## 2024-06-11 LAB — PHOSPHORUS: Phosphorus: 3.1 mg/dL (ref 2.5–4.6)

## 2024-06-11 NOTE — Progress Notes (Signed)
 Patient states appetite and swallowing have been getting better since Saturday. Complaints of Parkinson's worsening. Questions regarding chemotherapy and treatment plan today.

## 2024-06-11 NOTE — Progress Notes (Signed)
 Nutrition Brief Follow-up:  Spoke with wife via phone.  Reports that jaw and mouth pain improved over the weekend.  Patient was able to eat and drink more orally.  He has been eating/drinking coffee, diet soda, premier protein shake, egg bowl (meat and cheese), sweets.  Wife forgot to give him metformin last night.  Elevated blood glucose today.   Patient has tolerated 5 cartons of formula over the weekend each day without difficulty.   Phosphorus 3.1 Mag 2.0 K 4.5  Intervention Stop phosphorus and magnesium  supplementation. Wife verbalized understanding Give 5 cartons of tube feeding unless feeling full and then 4 cartons only.  Flush with at each feeding.   Give 1 cup water  at last feeding of day (8pm).   Eat and drink orally as able.    Next visit: Thursday, Jan 22 after radiation dry run  Donnita Farina B. Dasie SOLON, CSO, LDN Registered Dietitian [phone]

## 2024-06-11 NOTE — Assessment & Plan Note (Addendum)
#   PET DEC, 23rd, 2025- [Dr.McQueen]- Hypermetabolic uptake in the inferior oropharynx along the left floor of mouth with extension up into the posterior left nasopharyngeal region. Imaging features compatible with known primary neoplasm;  Hypermetabolic bilateral level II and left level III cervical lymph nodes compatible with metastatic disease. No evidence for hypermetabolic metastatic disease in the chest, abdomen, or pelvis.  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  [id number] -stage III.  # Recommend proceeding with weekly CarboTaxol-concurrent radiation [Jan 26th through March 13th].  Again reviewed the potential side effects including but not limited to nausea vomiting-risk of low blood counts; neuropathy.  However we will limit Taxol if patient was to have progressive neuropathy.  # Diabetes- not controlled-recommend compliance with medications.  Also evaluation with nutrition today.  # S/P PEG tube [Dr.Cintron]  # Parkinson- [walks with cane; Dr.Shah]- Hx of falls- On lamictal- for mood swings-monitor neuropathy and tremors closely given the risk of falls- monitor closely on taxol.   # IV access: Mediport [Dr.Cintron]  # ACP:s/p JOSH.   # weekly carbo-taxol- MD;labs-x3   # DISPOSITION: # follow up on 1/29- MD; labs- cbc/cmp; carbo-taxol- weekly- Dr.B-

## 2024-06-11 NOTE — Progress Notes (Signed)
 START ON PATHWAY REGIMEN - Head and Neck     A cycle is every 7 days, concurrent with RT:     Paclitaxel      Carboplatin   **Always confirm dose/schedule in your pharmacy ordering system**  Patient Characteristics: Oropharynx, HPV Positive, Preoperative or Nonsurgical Candidate, M0 (Clinical Staging), cT4, cN0-2, Not Eligible for Surgery Disease Classification: Oropharynx HPV Status: Positive (+) Therapeutic Status: Preoperative or Nonsurgical Candidate, M0 (Clinical Staging) Check here if patient was staged using an edition other than AJCC Staging 9th Edition: false AJCC T Category: cT4 AJCC N Category: cN2 AJCC M Category: cM0 AJCC 9 Stage Grouping: III Surgical Candidacy: Not Eligible for Surgery Intent of Therapy: Curative Intent, Discussed with Patient

## 2024-06-11 NOTE — Progress Notes (Signed)
 Bradley [name] CONSULT NOTE  Patient Care Team: Diedra Lame, MD as PCP - General (Family Medicine) Bradley Cindy SAUNDERS, MD as Consulting Physician (Oncology) Lenn Aran, MD as Consulting Physician (Radiation Oncology) Borders, Fonda SAUNDERS, NP as Nurse Practitioner (Hospice and Palliative Medicine)  CHIEF COMPLAINTS/PURPOSE OF CONSULTATION: head and neck cancer  #  Oncology History  Tonsillar cancer (HCC)  05/22/2024 Initial Diagnosis   Tonsillar cancer (HCC)   05/22/2024 Cancer Staging   Staging form: Pharynx - Oropharynx (HPV-Associated), AJCC V9 - Clinical: Stage III (cT4, cN2, cM0, p16+) - Signed by Bradley Cindy SAUNDERS, MD on 05/22/2024   06/21/2024 -  Chemotherapy   Patient is on Treatment Plan : HEAD/NECK Carboplatin + Paclitaxel + XRT q7d       HISTORY OF PRESENTING ILLNESS:  Bradley [name] 89 y.o.  male with hx of Parkinson disease and tonsillar cancer- HPV positive stage III is here for a follow up.   Discussed the use of AI scribe software for clinical note transcription with the patient, who gave verbal consent to proceed.  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  He is scheduled to begin seven weeks of radiation therapy with concurrent weekly low-dose carboplatin and docetaxel starting June 18, 2024. He has persistent cervical lymphadenopathy attributed to malignancy and lymphatic obstruction. He is at risk for mucositis and dysphagia, with anticipated ongoing dependence on enteral nutrition.  He has experienced improvement in jaw pain and swallowing since initiation of potassium and other medications, and is now able to tolerate small amounts of oral intake. Pain is currently well controlled. He remains dependent on a feeding  tube for nutrition. Recent adjustments in tube securement have improved comfort and resolved prior heat rash. Coordination of feeding and medication administration around chemotherapy infusions remains challenging for his caregiver, who reports significant fatigue and difficulty obtaining home health support.  He has Parkinson's disease, with increased tremors in the mid-afternoon and occasional missed medication doses, but is currently stable.  He has type 2 diabetes mellitus, with recent hyperglycemia (blood glucose 310 mg/dL) attributed to missed metformin doses and increased intake of high-sugar foods to maintain weight during periods of poor oral intake.  He and his caregiver have completed MOST forms for advanced care planning and are clarifying requirements for original documentation for facility and emergency use.      Review of Systems  Constitutional:  Positive for malaise/fatigue and weight loss. Negative for chills, diaphoresis and fever.  HENT:  Negative for nosebleeds and sore throat.   Eyes:  Negative for double vision.  Respiratory:  Negative for cough, hemoptysis, sputum production, shortness of breath and wheezing.   Cardiovascular:  Negative for chest pain, palpitations, orthopnea and leg swelling.  Gastrointestinal:  Negative for abdominal pain, blood in stool, constipation, diarrhea, heartburn, melena, nausea and vomiting.  Genitourinary:  Negative for dysuria, frequency and urgency.  Musculoskeletal:  Positive for back pain and joint pain.  Skin: Negative.  Negative for itching and rash.  Neurological:  Positive for dizziness, tingling, tremors and weakness. Negative for focal weakness and headaches.  Endo/Heme/Allergies:  Does not bruise/bleed easily.  Psychiatric/Behavioral:  Negative for depression. The patient is not nervous/anxious and does not have insomnia.      MEDICAL HISTORY:  Past Medical History:  Diagnosis Date   Anemia  BPH (benign prostatic  hypertrophy)    COPD (chronic obstructive pulmonary disease) (HCC)    Diabetes type 2, controlled (HCC)    GERD (gastroesophageal reflux disease)    RARE   GI bleed    2024   Glaucoma    Syndor   Hearing loss    bilateral hearing aids occasionally   History of kidney stones    HLD (hyperlipidemia)    HTN (hypertension)    Macular degeneration    Right (Appenseler)   Neuromuscular disorder (HCC)    Osteoarthritis    thumbs, hips, knees (s/p R TKR)   Parkinson disease (HCC)    PUD (peptic ulcer disease) 03/2013   2 antral gastric ulcers and 1 duodenal ulcer, NSAID related, admitted to [name] with melena/anemia s/p EGD   Sleep apnea    CPAP   Tremor    intention    SURGICAL HISTORY: Past Surgical History:  Procedure Laterality Date   BELPHAROPTOSIS REPAIR  2012   BIOPSY  11/10/2022   Procedure: BIOPSY;  Surgeon: Maryruth Ole DASEN, MD;  Location: [facility] ENDOSCOPY;  Service: Endoscopy;;   carotid US   12/2011   no significant stenosis   CATARACT EXTRACTION Bilateral 2011, 2012   CHOLECYSTECTOMY  1997   COLONOSCOPY  2009   per patient, told no longer needed   CREATION, GASTROSTOMY, OPEN N/A 06/01/2024   Procedure: CREATION, GASTROSTOMY, OPEN;  Surgeon: Rodolph Romano, MD;  Location: [facility] ORS;  Service: General;  Laterality: N/A;   CYSTOSCOPY W/ URETERAL STENT PLACEMENT Left 11/01/2016   Procedure: CYSTOSCOPY WITH STENT REPLACEMENT;  Surgeon: Penne Knee, MD;  Location: [facility] ORS;  Service: Urology;  Laterality: Left;   CYSTOSCOPY WITH STENT PLACEMENT Left 10/26/2016   Procedure: CYSTOSCOPY WITH STENT PLACEMENT;  Surgeon: Penne Knee, MD;  Location: [facility] ORS;  Service: Urology;  Laterality: Left;   ESOPHAGOGASTRODUODENOSCOPY  04/2013   hospitalization @ [facility], gastritis, gastric ulcers and duodenal ulcer with clean base (Rein)   ESOPHAGOGASTRODUODENOSCOPY (EGD) WITH PROPOFOL  N/A 11/10/2022   Procedure: ESOPHAGOGASTRODUODENOSCOPY (EGD) WITH PROPOFOL ;  Surgeon: Maryruth Ole DASEN, MD;  Location: [facility] ENDOSCOPY;  Service: Endoscopy;  Laterality: N/A;   HOLEP-LASER ENUCLEATION OF THE PROSTATE WITH MORCELLATION N/A 11/01/2016   Procedure: HOLEP-LASER ENUCLEATION OF THE PROSTATE WITH MORCELLATION;  Surgeon: Penne Knee, MD;  Location: [facility] ORS;  Service: Urology;  Laterality: N/A;   KIDNEY STONE SURGERY  57, 59, 89   PORTACATH PLACEMENT N/A 06/01/2024   Procedure: INSERTION, TUNNELED CENTRAL VENOUS DEVICE, WITH PORT;  Surgeon: Rodolph Romano, MD;  Location: [facility] ORS;  Service: General;  Laterality: N/A;   TOTAL KNEE ARTHROPLASTY Right 2006   URETEROSCOPY Left 10/26/2016   Procedure: URETEROSCOPY;  Surgeon: Penne Knee, MD;  Location: [facility] ORS;  Service: Urology;  Laterality: Left;   URETEROSCOPY WITH HOLMIUM LASER LITHOTRIPSY Left 11/01/2016   Procedure: URETEROSCOPY WITH HOLMIUM LASER LITHOTRIPSY;  Surgeon: Penne Knee, MD;  Location: [facility] ORS;  Service: Urology;  Laterality: Left;   US  ECHOCARDIOGRAPHY  12/2011   nl LV fxn, EF 60%, nl valves, dilated aortic root (40mm)    SOCIAL HISTORY: Social History   Socioeconomic History   Marital status: Married    Spouse name: Not on file   Number of children: Not on file   Years of education: Not on file   Highest education level: Not on file  Occupational History   Not on file  Tobacco Use   Smoking status: Former    Current packs/day: 0.00    Average packs/day:  1 pack/day for 45.0 years (45.0 ttl pk-yrs)    Types: Cigarettes, Cigars    Start date: 05/24/1998    Quit date: 10/23/1998    Years since quitting: 25.6   Smokeless tobacco: Never  Vaping Use   Vaping status: Never Used  Substance and Sexual Activity   Alcohol use: Not Currently    Comment: Occasional, maybe 5-6 per year   Drug use: No   Sexual activity: Not Currently  Other Topics Concern   Not on file  Social History Narrative   stYgerwaltLives with wife, 2 dogsGrown children by first 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   Social Drivers of Health   Tobacco Use: Medium Risk (06/11/2024)   Patient History    Smoking Tobacco Use: Former    Smokeless Tobacco Use: Never    Passive Exposure: Not on file  Financial Resource Strain: Low Risk  (11/15/2023)   Received from [name]   Overall Financial Resource Strain (CARDIA)    Difficulty of Paying Living Expenses: Not hard at all  Food Insecurity: No Food Insecurity (05/22/2024)   Epic    Worried About Programme Researcher, Broadcasting/film/video in the Last Year: Never true    Ran Out of Food in the Last Year: Never true  Transportation Needs: No Transportation Needs (05/22/2024)   Epic    Lack of Transportation (Medical): No    Lack of Transportation (Non-Medical): No  Physical Activity: Not on file  Stress: Not on file  Social Connections: Not on file  Intimate Partner Violence: Not At Risk (11/09/2022)   Humiliation, Afraid, Rape, and Kick questionnaire    Fear of Current or Ex-Partner: No    Emotionally Abused: No    Physically Abused: No    Sexually Abused: No  Depression (PHQ2-9): Low Risk (06/11/2024)   Depression (PHQ2-9)    PHQ-2 Score: 0  Alcohol Screen: Not on file  Housing: Low Risk (05/22/2024)   Epic    Unable to Pay for Housing in the Last Year: No    Number of Times Moved in the Last Year: 0    Homeless in the Last Year: No  Utilities: Not At Risk (05/22/2024)   Epic    Threatened with loss of utilities: No  Health Literacy: Not on file    FAMILY HISTORY: Family History  Problem Relation Age of Onset   Cancer Sister        lung, nonsmoker   Cancer Sister        lung, nonsmoker   Dementia Mother    CAD Father        heart problems   CAD Brother 79       MI   Stroke Neg Hx    Prostate cancer Neg Hx    Bladder Cancer Neg Hx    Kidney cancer Neg Hx     ALLERGIES:  is allergic to sulfa antibiotics and penicillins.  MEDICATIONS:  Current  Outpatient Medications  Medication Sig Dispense Refill   acetaminophen  (TYLENOL ) 500 MG tablet Take 1,000 mg by mouth daily as needed for headache, fever, moderate pain or mild pain.     albuterol  (VENTOLIN  HFA) 108 (90 Base) MCG/ACT inhaler Inhale 1-2 puffs into the lungs every 4 (four) hours as needed for shortness of breath or wheezing.     aspirin  EC (ASPIRIN  81) 81 MG tablet Take 1 tablet (81 mg total) by mouth daily. 30  tablet 12   atorvastatin  (LIPITOR) 20 MG tablet Take 20 mg by mouth every evening.      Biotin w/ Vitamins C & E (HAIR/SKIN/NAILS PO) Take 1,666 mg by mouth 2 (two) times daily.     brimonidine -timolol  (COMBIGAN ) 0.2-0.5 % ophthalmic solution Place 1 drop into both eyes 2 (two) times daily.      buPROPion  (WELLBUTRIN  XL) 150 MG 24 hr tablet Take 150 mg by mouth every morning.     carbidopa -levodopa  (SINEMET  CR) 50-200 MG tablet Take 1 tablet by mouth at bedtime.     carbidopa -levodopa  (SINEMET  IR) 25-100 MG tablet Take 2 tablets by mouth 4 (four) times daily. Extended release Taken at 0730, 1130, 1500 and 1830     Cholecalciferol  (VITAMIN D3) 1000 units CAPS Take 1,000 Units by mouth 2 (two) times daily.     docusate sodium  (COLACE) 100 MG capsule Take 100 mg by mouth 2 (two) times daily.     finasteride  (PROSCAR ) 5 MG tablet Take 5 mg by mouth every morning.      fluticasone (FLONASE) 50 MCG/ACT nasal spray Place 1 spray into both nostrils daily.     HYDROcodone -acetaminophen  (NORCO/VICODIN) 5-325 MG tablet Take 1 tablet by mouth every 4 (four) hours as needed for moderate pain (pain score 4-6). 60 tablet 0   ipratropium (ATROVENT ) 0.06 % nasal spray Place 1 spray into both nostrils 2 (two) times daily as needed for rhinitis.     lamoTRIgine (LAMICTAL) 25 MG tablet Take 25 mg by mouth 2 (two) times daily.     lidocaine -prilocaine  (EMLA ) cream Apply 1 Application topically as needed. 30 g 0   magnesium  oxide (MAG-OX) 400 (240 Mg) MG tablet Take 1 tablet (400 mg total) by  mouth daily. 15 tablet 0   Melatonin 10 MG TABS Take 10 mg by mouth at bedtime.     metFORMIN (GLUCOPHAGE-XR) 500 MG 24 hr tablet Take 500 mg by mouth 2 (two) times daily between meals as needed.     Multiple Vitamins-Minerals (PRESERVISION AREDS 2 PO) Take 1 tablet by mouth 2 (two) times daily.     Nutritional Supplements (NUTREN 1.5) LIQD Give 2 cartons via bolus syringe at 8am and noon and 1 carton at 4pm and 8pm.  Flush with 60ml of water  before and after each feeding.  Flush with additional 240ml of water  TID between feedings for additional hydration.     ondansetron  (ZOFRAN ) 8 MG tablet Take 1 tablet (8 mg total) by mouth every 8 (eight) hours as needed for nausea or vomiting. 20 tablet 0   pantoprazole  (PROTONIX ) 40 MG tablet Take 1 tablet (40 mg total) by mouth 2 (two) times daily. 120 tablet 0   polyethylene glycol powder (GLYCOLAX /MIRALAX ) 17 GM/SCOOP powder Take 17 g by mouth daily.     potassium & sodium phosphates  (PHOS-NAK) 280-160-250 MG PACK Take 1 packet by mouth 4 (four) times daily -  with meals and at bedtime. 60 each 0   prochlorperazine  (COMPAZINE ) 10 MG tablet Take 1 tablet (10 mg total) by mouth every 6 (six) hours as needed for nausea or vomiting. 30 tablet 0   rOPINIRole  (REQUIP ) 0.5 MG tablet Take 0.5 mg by mouth QID. 8 am, 1130, 1500, 1830     sertraline  (ZOLOFT ) 100 MG tablet Take 100 mg by mouth daily.     torsemide (DEMADEX) 20 MG tablet Take 10 mg by mouth daily. Takes M-Sat am     traMADol  (ULTRAM ) 50 MG tablet Take 1 tablet (50 mg  total) by mouth every 6 (six) hours as needed. 10 tablet 0   No current facility-administered medications for this visit.   PHYSICAL EXAMINATION:   Vitals:   06/11/24 1028  BP: 108/65  Pulse: 80  Temp: (!) 100.5 F (38.1 C)  SpO2: 95%    Filed Weights   06/11/24 1028  Weight: 153 lb (69.4 kg)    Bilateral neck adenopathy-the left tonsillar lesion cannot be evaluated today.   Physical Exam Vitals and nursing note  reviewed.  HENT:     Head: Normocephalic and atraumatic.     Mouth/Throat:     Pharynx: Oropharynx is clear.  Eyes:     Extraocular Movements: Extraocular movements intact.     Pupils: Pupils are equal, round, and reactive to light.  Cardiovascular:     Rate and Rhythm: Normal rate and regular rhythm.  Pulmonary:     Comments: Decreased breath sounds bilaterally.  Abdominal:     Palpations: Abdomen is soft.  Musculoskeletal:        General: Normal range of motion.     Cervical back: Normal range of motion.  Skin:    General: Skin is warm.  Neurological:     General: No focal deficit present.     Mental Status: He is alert and oriented to person, place, and time.  Psychiatric:        Behavior: Behavior normal.        Judgment: Judgment normal.      LABORATORY DATA:  I have reviewed the data as listed Lab Results  Component Value Date   WBC 7.1 06/04/2024   HGB 12.3 (L) 06/04/2024   HCT 37.2 (L) 06/04/2024   MCV 95.9 06/04/2024   PLT 193 06/04/2024   Recent Labs    06/04/24 1350 06/07/24 1402 06/11/24 1016  NA 134* 140 139  K 3.9 4.4 4.5  CL 97* 99 99  CO2 26 31 33*  GLUCOSE 127* 155* 310*  BUN 26* 37* 32*  CREATININE 0.82 0.92 0.92  CALCIUM  9.5 9.6 9.7  GFRNONAA >60 >60 >60  PROT 6.9 7.1 6.9  ALBUMIN 4.0 4.1 3.8  AST 27 48* 35  ALT 7 26 12   ALKPHOS 85 93 83  BILITOT 0.6 0.3 0.4    RADIOGRAPHIC STUDIES: I have personally reviewed the radiological images as listed and agreed with the findings in the report. DG Chest Port 1 View Result Date: 06/01/2024 CLINICAL DATA:  Port-A-Cath placement, head and neck cancer EXAM: PORTABLE CHEST 1 VIEW COMPARISON:  05/16/2024 FINDINGS: Single frontal view of the chest demonstrates right chest wall port via internal jugular approach, tip overlying superior vena cava. Cardiac silhouette is enlarged. No acute airspace disease, effusion, or pneumothorax. No acute bony abnormalities. IMPRESSION: 1. No complication after right  chest wall port placement. Electronically Signed   By: Ozell Daring M.D.   On: 06/01/2024 15:56   DG C-Arm 1-60 Min-No Report Result Date: 06/01/2024 Fluoroscopy was utilized by the requesting physician.  No radiographic interpretation.   DG SWALLOW FUNC OP MEDICARE SPEECH PATH Result Date: 05/25/2024 Table formatting from the original result was not included. Modified Barium Swallow Study Patient Details Name: YOSHIO SELIGA MRN: [id number] Date of Birth: [birth year] Today's Date: 05/25/2024 HPI/PMH: HPI: Kengo Sturges is an 89 year old male with 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 Since detection, the left neck mass has increased in size, and a new contralateral neck mass  developed over the past 1-2 days. He describes the left neck mass as feeling like a rubber ball. Relevant comorbidities include Parkinson's disease with progressive hand and jaw tremors, difficulty with fine motor tasks, cognitive decline, and finger numbness. He is legally blind (vision 20/2000) but remains independent in activities of daily living with assistive devices. He has type 2 diabetes and a remote tobacco use history, having quit in 2000 after a 30-year history.     Per pt and his wife's rpeort, he is currently seeing a speech therapist at [facility] twice a week for cognition. Per chart he also participated in [facility] (01/08/2019 thru 02/12/2019).     CT Soft Tissue Neck 04/13/2024  There is an irregular soft tissue mass involving the left base of tongue with  effacement of the vallecula and extension along the lingual epiglottic surface.  There is extension deep into the floor of mouth, more pronounced on the left  though also involving the right. In total, the irregular soft tissue mass  measures approximately 4.8 x 4.3 x 4.7 cm (AP x TR x CC) and involves the left  lateral pharyngeal sidewall. There is  extension to involve the contralateral  right base of tongue region as well. Clinical Impression: Pt presents with overall adequate oropharyngeal abilities when consuming thin liquids, nectar thick liquids, puree, graham cracker with barium paste and whole barium tablet with thin liquids. When consuming puree and soft solids, pt was observed grimacing with report of increased pain to 8 out of 10 therefore longer more deliberate handling of the boluses orally was observed. No aspiration or penetration was observed. Extensive education provided on the results of this study, aspiration precautions, diet recommendation as well as Outpatient ST services targeting perseveration of swallow function during radiation. All questions were answered to pt and his wife's satisfaction. Factors that may increase risk of adverse event in presence of aspiration Noe & Lianne 2021): Factors that may increase risk of adverse event in presence of aspiration Noe & Lianne 2021): Reduced cognitive function (upcoming radiation of HNC cancer, pain with solids, tumor presence) Recommendations/Plan: Swallowing Evaluation Recommendations Swallowing Evaluation Recommendations Recommendations: PO diet PO Diet Recommendation: Regular; Thin liquids (Level 0) Liquid Administration via: Cup; Straw Medication Administration: Whole meds with liquid Supervision: Patient able to self-feed Swallowing strategies  : Minimize environmental distractions; Slow rate; Small bites/sips Postural changes: Position pt fully upright for meals; Stay upright 30-60 min after meals Oral care recommendations: Oral care BID (2x/day) Treatment Plan Treatment Plan Treatment recommendations: No treatment recommended at this time Follow-up recommendations: Outpatient SLP (to follow throughout [facility] treatment) Recommendations Recommendations for follow up therapy are one component of a multi-disciplinary discharge planning process, led by the attending physician.   Recommendations may be updated based on patient status, additional functional criteria and insurance authorization. Assessment: Orofacial Exam: Orofacial Exam Oral Cavity: Oral Hygiene: WFL Oral Cavity - Dentition: Dentures, top; Dentures, bottom Orofacial Anatomy: WFL Oral Motor/Sensory Function: WFL (reports increased pain during lingual lateralization) Anatomy: Anatomy: WFL Boluses Administered: Boluses Administered Boluses Administered: Thin liquids (Level 0); Mildly thick liquids (Level 2, nectar thick); Puree; Solid  Oral Impairment Domain: Oral Impairment Domain Lip Closure: No labial escape Tongue control during bolus hold: Cohesive bolus between tongue to palatal seal Bolus preparation/mastication: Timely and efficient chewing and mashing (when consuming puree and soft solids, pt grimacing with report of increased pain to 8 out of 10 therefore longer more deliberate handling of boluses orally) Bolus transport/lingual motion: Brisk tongue motion  Oral residue: Trace residue lining oral structures Location of oral residue : Tongue Initiation of pharyngeal swallow : Valleculae  Pharyngeal Impairment Domain: Pharyngeal Impairment Domain Soft palate elevation: No bolus between soft palate (SP)/pharyngeal wall (PW) Laryngeal elevation: Complete superior movement of thyroid cartilage with complete approximation of arytenoids to epiglottic petiole Anterior hyoid excursion: Complete anterior movement Epiglottic movement: Complete inversion Laryngeal vestibule closure: Complete, no air/contrast in laryngeal vestibule Pharyngeal stripping wave : Present - complete Pharyngoesophageal segment opening: Complete distension and complete duration, no obstruction of flow Tongue base retraction: Trace column of contrast or air between tongue base and PPW Pharyngeal residue: Trace residue within or on pharyngeal structures Location of pharyngeal residue: Pharyngeal wall; Tongue base  Esophageal Impairment Domain: Esophageal  Impairment Domain Esophageal clearance upright position: Complete clearance, esophageal coating Pill: Pill Consistency administered: Thin liquids (Level 0) Thin liquids (Level 0): [facility] Penetration/Aspiration Scale Score: Penetration/Aspiration Scale Score 1.  Material does not enter airway: Thin liquids (Level 0); Mildly thick liquids (Level 2, nectar thick); Puree; Solid; Pill Compensatory Strategies: No data recorded  General Information: Caregiver present: Yes  Diet Prior to this Study: Regular; Thin liquids (Level 0)   Temperature : Normal   Respiratory Status: WFL   Supplemental O2: None (Room air)   History of Recent Intubation: No  Behavior/Cognition: Alert; Cooperative; Pleasant mood Self-Feeding Abilities: Able to self-feed Baseline vocal quality/speech: Normal Volitional Cough: Able to elicit Volitional Swallow: Able to elicit Exam Limitations: No limitations Goal Planning: No data recorded No data recorded No data recorded Patient/Family Stated Goal: to presevere swallow function throughout treatment for [name] Consulted and agree with results and recommendations: Patient; Family member/caregiver; Physician; Nurse Pain: Pain Assessment Pain Assessment: No/denies pain (increaed to 8 out of 10 (posterior oral cavity/base of tongue) when consuming puree and soft solids) End of Session: Start Time:SLP Start Time (ACUTE ONLY): 0930 Stop Time: SLP Stop Time (ACUTE ONLY): 1015 Time Calculation:SLP Time Calculation (min) (ACUTE ONLY): 45 min Charges: SLP Evaluations $ SLP Speech Visit: 1 Visit SLP Evaluations $MBS Swallow: 1 Procedure $Self Care/Home Management: 8-22 SLP visit diagnosis: SLP Visit Diagnosis: Dysphagia, unspecified (R13.10) Past Medical History: Past Medical History: Diagnosis Date  Anemia   BPH (benign prostatic hypertrophy)   Diabetes type 2, controlled (HCC)   GERD (gastroesophageal reflux disease)   RARE  Glaucoma   Syndor  Hearing loss   bilateral hearing aids occasionally  History of kidney  stones   HLD (hyperlipidemia)   HTN (hypertension)   Macular degeneration   Right (Appenseler)  Osteoarthritis   thumbs, hips, knees (s/p R TKR)  PUD (peptic ulcer disease) 03/2013  2 antral gastric ulcers and 1 duodenal ulcer, NSAID related, admitted to [facility] with melena/anemia s/p EGD  Sleep apnea   CPAP  Tremor   intention Past Surgical History: Past Surgical History: Procedure Laterality Date  BELPHAROPTOSIS REPAIR  2012  BIOPSY  11/10/2022  Procedure: BIOPSY;  Surgeon: Maryruth Ole DASEN, MD;  Location: [facility] ENDOSCOPY;  Service: Endoscopy;;  carotid US   12/2011  no significant stenosis  CATARACT EXTRACTION Bilateral 2011, 2012  CHOLECYSTECTOMY  1997  COLONOSCOPY  2009  per patient, told no longer needed  CYSTOSCOPY W/ URETERAL STENT PLACEMENT Left 11/01/2016  Procedure: CYSTOSCOPY WITH STENT REPLACEMENT;  Surgeon: Penne Knee, MD;  Location: [facility];  Service: Urology;  Laterality: Left;  CYSTOSCOPY WITH STENT PLACEMENT Left 10/26/2016  Procedure: CYSTOSCOPY WITH STENT PLACEMENT;  Surgeon: Penne Knee, MD;  Location: [facility];  Service: Urology;  Laterality: Left;  ESOPHAGOGASTRODUODENOSCOPY  04/2013  hospitalization @ [facility], gastritis, gastric ulcers and duodenal ulcer with clean base (Rein)  ESOPHAGOGASTRODUODENOSCOPY (EGD) WITH PROPOFOL  N/A 11/10/2022  Procedure: ESOPHAGOGASTRODUODENOSCOPY (EGD) WITH PROPOFOL ;  Surgeon: Maryruth Ole DASEN, MD;  Location: [facility];  Service: Endoscopy;  Laterality: N/A;  HOLEP-LASER ENUCLEATION OF THE PROSTATE WITH MORCELLATION N/A 11/01/2016  Procedure: HOLEP-LASER ENUCLEATION OF THE PROSTATE WITH MORCELLATION;  Surgeon: Penne Knee, MD;  Location: [facility];  Service: Urology;  Laterality: N/A;  KIDNEY STONE SURGERY  83, 87, 90  TOTAL KNEE ARTHROPLASTY Right 2006  URETEROSCOPY Left 10/26/2016  Procedure: URETEROSCOPY;  Surgeon: Penne Knee, MD;  Location: [facility];  Service: Urology;  Laterality: Left;  URETEROSCOPY WITH HOLMIUM LASER LITHOTRIPSY Left 11/01/2016   Procedure: URETEROSCOPY WITH HOLMIUM LASER LITHOTRIPSY;  Surgeon: Penne Knee, MD;  Location: [facility];  Service: Urology;  Laterality: Left;  US  ECHOCARDIOGRAPHY  12/2011  nl LV fxn, EF 60%, nl valves, dilated aortic root (40mm) Happi Overton 05/25/2024, 4:20 PM CLINICAL DATA:  Patient with dysphagia following head and neck radiation. EXAM: MODIFIED BARIUM SWALLOW TECHNIQUE: Radiologist, not in attendance for the exam. Different consistencies of barium were administered orally to the patient by the Speech Pathologist. Imaging of the pharynx was performed in the lateral projection. The radiology APP, Brittany Huneycutt, NP, was present in the fluoroscopy room for this study, providing personal supervision. FLUOROSCOPY: Radiation Exposure Index (as provided by the fluoroscopic device): 13.2 mGy Kerma COMPARISON:  04/13/24 CT Soft Tissue Neck FINDINGS: Vestibular  Penetration:  None seen. Aspiration:  None seen. Other:  None. IMPRESSION: No vestibular penetration or aspiration seen. Please refer to the Speech Pathologists report for complete details and recommendations. Electronically Signed   By: Wilkie Lent M.D.   On: 05/25/2024 13:02  NM PET Image Initial (PI) Skull Base To Thigh (F-18 FDG) Result Date: 05/16/2024 CLINICAL DATA:  Initial treatment strategy for head neck cancer. EXAM: NUCLEAR MEDICINE PET SKULL BASE TO THIGH TECHNIQUE: 8.4 mCi F-18 FDG was injected intravenously. Full-ring PET imaging was performed from the skull base to thigh after the radiotracer. CT data was obtained and used for attenuation correction and anatomic localization. Fasting blood glucose: 137 mg/dl COMPARISON:  Neck CT 88/78/7974 FINDINGS: Mediastinal blood pool activity: SUV max 2.4 Liver activity: SUV max NA NECK: Hypermetabolic uptake in the inferior oropharynx along the left floor of mouth is hypermetabolic with SUV max = 11.7. Small focus of hypermetabolism extends up into the posterior left nasopharyngeal region  with SUV max = 4.7. Right-sided level II lymph node measures 2.9 x 2.0 cm today with SUV max = 10.0. Left-sided level II and III lymph nodes demonstrate SUV max up to 10.3. Incidental CT findings: Irregular soft tissue in the left oropharynx and floor mouth was better evaluated on previous neck CT with contrast material. CHEST: No hypermetabolic mediastinal or hilar nodes. No suspicious pulmonary nodules on the CT scan. Incidental CT findings: The heart is enlarged. Coronary artery calcification is evident. Moderate atherosclerotic calcification is noted in the wall of the thoracic aorta. Ascending thoracic aorta measures 4.6 cm diameter. Areas of noncalcified pleural thickening in the right hemithorax show no discernible hypermetabolism. ABDOMEN/PELVIS: No abnormal hypermetabolic activity within the liver, pancreas, adrenal glands, or spleen. No hypermetabolic lymph nodes in the abdomen or pelvis. Incidental CT findings: Cholecystectomy. Relatively diffuse thickening of the gastric wall noted without hypermetabolism. Bilateral nonobstructing nephrolithiasis. There is moderate atherosclerotic calcification of the abdominal aorta without aneurysm. Left colonic diverticulosis without diverticulitis. Prostate gland is markedly enlarged.  SKELETON: No focal hypermetabolic activity to suggest skeletal metastasis. Incidental CT findings: No worrisome lytic or sclerotic osseous abnormality. IMPRESSION: 1. Hypermetabolic uptake in the inferior oropharynx along the left floor of mouth with extension up into the posterior left nasopharyngeal region. Imaging features compatible with known primary neoplasm. 2. Hypermetabolic bilateral level II and left level III cervical lymph nodes compatible with metastatic disease. 3. No evidence for hypermetabolic metastatic disease in the chest, abdomen, or pelvis. 4. Bilateral nonobstructing nephrolithiasis. 5. Left colonic diverticulosis without diverticulitis. 6. Marked prostatomegaly.  7.  Aortic Atherosclerosis (ICD10-I70.0). Electronically Signed   By: Camellia Candle M.D.   On: 05/16/2024 11:09    ASSESSMENT & PLAN:   Tonsillar cancer (HCC) # PET DEC, 23rd, 2025- [Dr.McQueen]- Hypermetabolic uptake in the inferior oropharynx along the left floor of mouth with extension up into the posterior left nasopharyngeal region. Imaging features compatible with known primary neoplasm;  Hypermetabolic bilateral level II and left level III cervical lymph nodes compatible with metastatic disease. No evidence for hypermetabolic metastatic disease in the chest, abdomen, or pelvis.  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  [id number] -stage III.  # Recommend proceeding with weekly CarboTaxol-concurrent radiation [Jan 26th through March 13th].  Again reviewed the potential side effects including but not limited to nausea vomiting-risk of low blood counts; neuropathy.  However we will limit Taxol if patient was to have progressive neuropathy.  # Diabetes- not controlled-recommend compliance with medications.  Also evaluation with nutrition today.  # S/P PEG tube [Dr.Cintron]  # Parkinson- [walks with cane; Dr.Shah]- Hx of falls- On lamictal- for mood swings-monitor neuropathy and tremors closely given the risk of falls- monitor closely on taxol.   # IV access: Mediport [Dr.Cintron]  # ACP:s/p JOSH.   # weekly carbo-taxol- MD;labs-x3   # DISPOSITION: # follow up on 1/29- MD; labs- cbc/cmp; carbo-taxol- weekly- Dr.B-   All questions were answered. The patient knows to call the clinic with any problems, questions or concerns.    Cindy JONELLE [name], MD 06/11/2024 1:27 PM

## 2024-06-13 ENCOUNTER — Ambulatory Visit: Attending: Internal Medicine | Admitting: Speech Pathology

## 2024-06-13 DIAGNOSIS — R41841 Cognitive communication deficit: Secondary | ICD-10-CM | POA: Insufficient documentation

## 2024-06-13 DIAGNOSIS — R1312 Dysphagia, oropharyngeal phase: Secondary | ICD-10-CM | POA: Insufficient documentation

## 2024-06-13 DIAGNOSIS — C099 Malignant neoplasm of tonsil, unspecified: Secondary | ICD-10-CM | POA: Insufficient documentation

## 2024-06-14 ENCOUNTER — Other Ambulatory Visit: Payer: Self-pay

## 2024-06-14 ENCOUNTER — Ambulatory Visit
Admission: RE | Admit: 2024-06-14 | Discharge: 2024-06-14 | Disposition: A | Discharge status: Home / Self Care | Source: Ambulatory Visit | Attending: Radiation Oncology | Admitting: Radiation Oncology

## 2024-06-14 ENCOUNTER — Inpatient Hospital Stay

## 2024-06-14 DIAGNOSIS — C099 Malignant neoplasm of tonsil, unspecified: Secondary | ICD-10-CM

## 2024-06-14 NOTE — Progress Notes (Signed)
 Nutrition Follow-up:  Patient with HPV stage III tonsillar cancer. Planning to start radiation on 1/27 and chemo on 1/29.    Met with patient and wife following radiation dry run.  Met with SLP yesterday.  Pain is jaw and mouth is better.  Eating more orally.  Had lasagna and salad last night for dinner. Drank 2 premier protein shakes and ate 2 eggs yesterday.   Patient continues to tolerate 5 cartons of nutren 1.5 via feeding tube (2 cartons in am, 2 cartons mid day and then 1 carton in evening.  Drinking water , bai drinks, milk, decaf, diet soda.  Reports bowel movement this am with taking 1 dose of miralax . Does not complain of feeling too full at this time.   Reports improved jaw pain which has increased ability to eat orally.    Medications: reviewed  Labs: none today  Anthropometrics:   Weight 155 lb 7 oz today  158 lb on 1/12 168 lb on 08/25/23   Estimated Energy Needs  Kcals: 1800-2160 Protein: 86-108 g Fluid: > 1800 ml  NUTRITION DIAGNOSIS: Inadequate oral intake continues, relying on feeding tube.   INTERVENTION:  Continue 5 cartons of nutren 1.5 via feeding tube. Water  flush 180 ml at each feeding TID.   Provides 1875 calories, 85 g, 1495 ml free water  (from formula and flush). Needs to drink at least 2 more cups of fluid daily.  Continue to eat foods orally as able for added nutrition Continue miralax  to prevent constipation     MONITORING, EVALUATION, GOAL: weight trends, tube feeding   NEXT VISIT: Thursday, Jan 29 during infusion  Jayelle Page B. Dasie SOLON, CSO, LDN Registered Dietitian [phone]

## 2024-06-14 NOTE — Therapy (Signed)
 " OUTPATIENT SPEECH LANGUAGE PATHOLOGY     Patient Name: Bradley [name] MRN: [id number] DOB:1935/06/13, 89 y.o., male Today's Date: 06/14/2024  PCP: Jerona Sayre, MD REFERRING PROVIDER: Cindy Day, MD   End of Session - 06/13/24 1526     Visit Number 1    Number of Visits 25    Date for Recertification  09/05/24    Authorization Type Medicare Part A/ Medicare Part B    Progress Note Due on Visit 10    SLP Start Time 1445    SLP Stop Time  1530    SLP Time Calculation (min) 45 min    Activity Tolerance Patient tolerated treatment well          Past Medical History:  Diagnosis Date   Anemia    BPH (benign prostatic hypertrophy)    COPD (chronic obstructive pulmonary disease) (HCC)    Diabetes type 2, controlled (HCC)    GERD (gastroesophageal reflux disease)    RARE   GI bleed    2024   Glaucoma    [place]   Hearing loss    bilateral hearing aids occasionally   History of kidney stones    HLD (hyperlipidemia)    HTN (hypertension)    Macular degeneration    Right ([place])   Neuromuscular disorder (HCC)    Osteoarthritis    thumbs, hips, knees (s/p R TKR)   Parkinson disease (HCC)    PUD (peptic ulcer disease) 03/2013   2 antral gastric ulcers and 1 duodenal ulcer, NSAID related, admitted to [facility] with melena/anemia s/p EGD   Sleep apnea    CPAP   Tremor    intention   Past Surgical History:  Procedure Laterality Date   BELPHAROPTOSIS REPAIR  2012   BIOPSY  11/10/2022   Procedure: BIOPSY;  Surgeon: Maryruth Ole DASEN, MD;  Location: [facility] ENDOSCOPY;  Service: Endoscopy;;   carotid US   12/2011   no significant stenosis   CATARACT EXTRACTION Bilateral 2011, 2012   CHOLECYSTECTOMY  1997   COLONOSCOPY  2009   per patient, told no longer needed   CREATION, GASTROSTOMY, OPEN N/A 06/01/2024   Procedure: CREATION, GASTROSTOMY, OPEN;  Surgeon: Rodolph Romano, MD;  Location: [facility] ORS;  Service: General;  Laterality: N/A;   CYSTOSCOPY W/  URETERAL STENT PLACEMENT Left 11/01/2016   Procedure: CYSTOSCOPY WITH STENT REPLACEMENT;  Surgeon: Penne Knee, MD;  Location: [facility] ORS;  Service: Urology;  Laterality: Left;   CYSTOSCOPY WITH STENT PLACEMENT Left 10/26/2016   Procedure: CYSTOSCOPY WITH STENT PLACEMENT;  Surgeon: Penne Knee, MD;  Location: [facility] ORS;  Service: Urology;  Laterality: Left;   ESOPHAGOGASTRODUODENOSCOPY  04/2013   hospitalization @ [facility], gastritis, gastric ulcers and duodenal ulcer with clean base ([place])   ESOPHAGOGASTRODUODENOSCOPY (EGD) WITH PROPOFOL  N/A 11/10/2022   Procedure: ESOPHAGOGASTRODUODENOSCOPY (EGD) WITH PROPOFOL ;  Surgeon: Maryruth Ole DASEN, MD;  Location: [facility] ENDOSCOPY;  Service: Endoscopy;  Laterality: N/A;   HOLEP-LASER ENUCLEATION OF THE PROSTATE WITH MORCELLATION N/A 11/01/2016   Procedure: HOLEP-LASER ENUCLEATION OF THE PROSTATE WITH MORCELLATION;  Surgeon: Penne Knee, MD;  Location: [facility] ORS;  Service: Urology;  Laterality: N/A;   KIDNEY STONE SURGERY  55, 91, 90   PORTACATH PLACEMENT N/A 06/01/2024   Procedure: INSERTION, TUNNELED CENTRAL VENOUS DEVICE, WITH PORT;  Surgeon: Rodolph Romano, MD;  Location: [facility] ORS;  Service: General;  Laterality: N/A;   TOTAL KNEE ARTHROPLASTY Right 2006   URETEROSCOPY Left 10/26/2016   Procedure: URETEROSCOPY;  Surgeon: Penne Knee, MD;  Location: [facility]  ORS;  Service: Urology;  Laterality: Left;   URETEROSCOPY WITH HOLMIUM LASER LITHOTRIPSY Left 11/01/2016   Procedure: URETEROSCOPY WITH HOLMIUM LASER LITHOTRIPSY;  Surgeon: Penne Knee, MD;  Location: [facility] ORS;  Service: Urology;  Laterality: Left;   US  ECHOCARDIOGRAPHY  12/2011   nl LV fxn, EF 60%, nl valves, dilated aortic root (40mm)   Patient Active Problem List   Diagnosis Date Noted   Tonsillar cancer (HCC) 05/22/2024   DKA (diabetic ketoacidosis) (HCC) 11/09/2022   UGIB (upper gastrointestinal bleed) 11/09/2022   Acute blood loss anemia 11/09/2022   Diabetes mellitus without  complication (HCC) 11/09/2022   Leukocytosis 11/09/2022   AKI (acute kidney injury) 11/09/2022   Essential tremor 11/09/2022   Parkinson disease (HCC) 11/09/2022   Cerebral contusion without loss of consciousness (HCC) 08/01/2022   Loss of weight 04/20/2013   Greater trochanteric bursitis of left hip 03/07/2013   Benign prostatic hyperplasia    HTN (hypertension)    Diabetes type 2, uncontrolled    Osteoarthritis    Glaucoma    HLD (hyperlipidemia)     ONSET DATE: date of referral  05/22/2024  REFERRING DIAG: C09.9 (ICD-10-CM) - Tonsillar cancer (HCC)   THERAPY DIAG:  Cognitive communication deficit  Dysphagia, oropharyngeal phase  Tonsillar cancer (HCC)  Rationale for Evaluation and Treatment Rehabilitation  SUBJECTIVE:   SUBJECTIVE STATEMENT: 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 Since detection, the left neck mass has increased in size, and a new contralateral neck mass developed over the past 1-2 days. He describes the left neck mass as feeling like a rubber ball. Relevant comorbidities include Parkinson's disease with progressive hand and jaw tremors, difficulty with fine motor tasks, cognitive decline, and finger numbness. He is legally blind (vision 20/2000) but remains independent in activities of daily living with assistive devices. He has type 2 diabetes and a remote tobacco use history, having quit in 2000 after a 30-year history.        Per pt and his wife's rpeort, he is currently seeing a speech therapist at [facility] twice a week for cognition. Per chart he also participated in [facility] (01/08/2019 thru 02/12/2019).        CT Soft Tissue Neck 04/13/2024   There is an irregular soft tissue mass involving the left base of tongue with  effacement of the vallecula and extension along the lingual epiglottic surface.  There is extension deep into the floor of mouth, more pronounced on the left  though also involving the right. In total, the irregular soft tissue mass  measures approximately 4.8 x 4.3 x 4.7 cm (AP x TR x CC) and involves the left  lateral pharyngeal sidewall. There is extension to involve the contralateral right base of tongue region as well. There are heterogeneous, pathologic enlarged bilateral level 2 lymph nodes measuring up to 2.5 x 3.0 cm on the right and 2.4 x 2.1 cm on the left,suspicious for metastatic disease. There is a pathologic left lateral retropharyngeal lymph node. These demonstrate regions of central hypodensity likely representing necrosis. There are additional prominent pathological left level 2 and left level 3 lymph nodes.     MRI 04/30/2024 Multiple chronic microhemorrhages in the cerebral hemispheres, right greater than left, increased from  prior study, with distribution concerning for cerebral amyloid angiopathy. 2. Mild to moderate generalized parenchymal volume loss, slightly increased since 2018. 3. Moderate chronic microvascular ischemic changes, slightly increased since 2018. 4. 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     PET Scan 05/16/2024 Hypermetabolic uptake in the inferior oropharynx along the left floor of mouth is hypermetabolic with SUV max = 11.7. Small focus of hypermetabolism extends up into the posterior left nasopharyngeal region with SUV max = 4.7.   Right-sided level II lymph node measures 2.9 x 2.0 cm today with SUV max = 10.0. Left-sided level II and III lymph nodes demonstrate SUV max up to 10.3.   Incidental CT findings: Irregular soft tissue in the left oropharynx and floor mouth was better evaluated on previous neck CT with contrast material.  Pt also reports receiving ST  [facility] for cognitive impairment.     PAIN:  Are you having pain? No   FALLS: Has patient fallen in last 6 months?  No  LIVING ENVIRONMENT: Lives with: lives with their spouse Lives in: Other [place]  PLOF:  Level of assistance: Needed assistance with ADLs, Needed assistance with IADLS Employment: Retired   PATIENT GOALS   to maintain swallow function and improve cognitive communication abilities  OBJECTIVE:   COGNITIVE COMMUNICATION Overall cognitive status: Impaired Areas of impairment:  Memory: Impaired: Immediate Working Short term Prospective Functional Impairments: Pt is reliant on his wife for recall of information, uses alarm on cell phone for reminders of medicines. Pt is legally blind and has difficulty writing d/t bilateral   AUDITORY COMPREHENSION  Overall auditory comprehension: Appears intact YES/NO questions: Appears intact Following directions: Appears intact Conversation: Simple  READING COMPREHENSION: pt is legally blind but does attempt to read larger print  EXPRESSION: verbal  VERBAL EXPRESSION:   Overall verbal expression: Appears intact Level of generative/spontaneous verbalization: conversation Automatic speech: name: intact and social response: intact  Repetition: Appears intact Naming: Confrontation: 76-100% and Divergent: 76-100% Pragmatics: Appears intact Non-verbal means of communication: N/A  WRITTEN EXPRESSION: Dominant hand: right Written expression: at baseline  ORAL MOTOR EXAMINATION Facial : WFL Lingual: WFL Velum: WFL Mandible: WFL Cough: WFL Voice: WFL  MOTOR SPEECH: Overall motor speech: Appears intact Respiration: diaphragmatic/abdominal breathing Phonation: low vocal intensity Resonance: WFL Articulation: Appears intact Intelligibility: Intelligible Motor planning: Appears intact   STANDARDIZED ASSESSMENTS: Addenbrooke's Cognitive Examination - ACE III The Addenbrooke's Cognitive  Examination-III (ACE-III) is a brief cognitive test that assesses five cognitive domains. The total score is 100 with higher scores indicating better cognitive functioning. Cut off scores of 88 and 82 are recommended for suspicion of dementia (88 has sensitivity of 1.00 and specificity of 0.96, 82 has sensitivity of 0.93 and specificity of 1.00). American Version B  Pt obtained a score of 63.    CLINICAL SWALLOW ASSESSMENT:   Current diet: Dysphagia 2 (chopped/minced) and thin liquids Dentition: dentures (top) and dentures (bottom) Feeding: able to feed self Consistencies tested: Thin Liquid: Presentation: Straw and Self-fed Oral Phase: WFL Pharyngeal Phase: WFL Other: whole pill Presentation: By hand Oral Phase: WFL Pharyngeal Phase: WFL   Evaluation findings: Patient presents with oropharyngeal swallow which appears clinically to be within functional limits with adequate airway protection. Oral stage is characterized by appearance of adequate oral containment, mastication, bolus formation, oral transfer and oral clearance. Swallow initiation appears timely. No overt signs of aspiration observed despite challenging with consecutive straw sips of thin liquids in excess of 3oz.  Aspiration risk  factors:Neurological disease, Cognitive impairment, and Other: HNC radiation Overall aspiration risk:Mild Diet Recommendations: regular and thin liquids Precautions:Minimize environmental distractions, Slow rate, Small sips/bites, Seated upright 90 degrees, and Remain upright for at least 30 minutes after meals Supervision: Patient able to feed self Oral care recommendations:Oral care BID Follow-up recommendations: Therapy as outlined in treatment plan below    PATIENT REPORTED OUTCOME MEASURES (PROM): To be completed over the next 3 sessions   TODAY'S TREATMENT:  Skilled education provided on aspiration precautions, diet recommendation, muscles uses with oral and pharyngeal intake.   Research  states the risk for dysphagia increases due to radiation and/or chemotherapy treatment due to a variety of factors, so SLP educated the pt about the possibility of reduced/limited ability for PO intake during rad tx. SLP also educated pt regarding possible changes to swallowing musculature after rad tx, and why adherence to dysphagia HEP provided today and PO consumption was necessary to inhibit muscle fibrosis following rad tx and to mitigate muscle disuse atrophy. SLP informed pt why this would be detrimental to their swallowing status and to their pulmonary health. Pt demonstrated understanding of these things to SLP. SLP encouraged pt to safely eat and drink as deep into their radiation/chemotherapy as possible to provide the best possible long-term swallowing outcome for pt.   Secondly, pt was told that former patients have told SLP that during their course of radiation therapy, taking prescribed pain medication just prior to performing HEP (and eating/drinking) has proven helpful in completing HEP (and eating and drinking) more regularly when going through their course of radiation treatment    PATIENT EDUCATION: Education details: late effects head/neck radiation on swallow function, HEP procedure, and modification to HEP when difficulty experienced with swallowing during and after radiation course, results of this assessment, ST POC Person educated: Patient and Spouse Education method: Explanation Education comprehension: needs further education    HOME EXERCISE PROGRAM:   Improve PO intake to include items that require chewing to continue of masseter muscles    GOALS:  Goals reviewed with patient? Yes  SHORT TERM GOALS: Target date: 10 sessions  The patient will consume a mechanical soft meal, demonstrating rotary chew 50% of bites consumed.   Baseline: Goal status: INITIAL  2.  With moderate A, pt will be able to verbalize understanding of a home exercise program for pharyngeal,  trismus and cervical range of motion.   Baseline:  Goal status: INITIAL  3.  With Min A, patient will demonstrate understanding of functional cognitive activities by listing 2 activities for home maintenance program. Baseline:  Goal status: INITIAL   LONG TERM GOALS: Target date: 09/05/2024  Patient will consume recommended diet using strategies and compensations without overt s/sx aspiration >95% of the time.   Baseline:  Goal status: INITIAL  2.  Pt will be able to verbalize understanding of a home exercise program for pharyngeal, trismus and cervical range of motion.   Baseline:  Goal status: INITIAL  3.  Patient will report engagement in cognitive activities outside of ST for 4/7 days. Baseline:  Goal status: INITIAL   ASSESSMENT:  CLINICAL IMPRESSION: Patient is a 89 y.o. male who was seen today for assessment of swallowing as they undergo radiation/chemoradiation therapy and cognitive communication evaluation. At this time, pt reports reduced jaw pain and increased PO consumption as a result. Education provided on results of Modified Barium Swallow Study with instructions to increase food textures to preserve rotary chew. Pt also presents with a moderate to severe cognitive  impairment that is likely exacerbated by vision loss and difficulty writing d/t tremors.   OBJECTIVE IMPAIRMENTS include memory and dysphagia. These impairments are limiting patient from effectively communicating at home and in community and safety when swallowing. Factors affecting potential to achieve goals and functional outcome are ability to learn/carryover information, co-morbidities, medical prognosis, previous level of function, and severity of impairments. Patient will benefit from skilled SLP services to address above impairments and improve overall function.  REHAB POTENTIAL: Good  PLAN: SLP FREQUENCY: 1x/month  SLP DURATION: 12 weeks  PLANNED INTERVENTIONS: Aspiration precaution training,  Pharyngeal strengthening exercises, Diet toleration management , Environmental controls, Internal/external aids, Oral motor exercises, Functional tasks, SLP instruction and feedback, Compensatory strategies, and Patient/family education   Sergei Delo B. Rubbie, M.S., CCC-SLP, CBIS Speech-Language Pathologist Certified Brain Injury Specialist [facility]  [facility] [phone] Ascom [phone] Fax [phone]  "

## 2024-06-14 NOTE — Progress Notes (Signed)
 Pharmacist Chemotherapy Monitoring - Initial Assessment    Anticipated start date: 06/21/24   The following has been reviewed per standard work regarding the patient's treatment regimen: The patient's diagnosis, treatment plan and drug doses, and organ/hematologic function Lab orders and baseline tests specific to treatment regimen  The treatment plan start date, drug sequencing, and pre-medications Prior authorization status  Patient's documented medication list, including drug-drug interaction screen and prescriptions for anti-emetics and supportive care specific to the treatment regimen The drug concentrations, fluid compatibility, administration routes, and timing of the medications to be used The patient's access for treatment and lifetime cumulative dose history, if applicable  The patient's medication allergies and previous infusion related reactions, if applicable  METASTATIC SQUAMOUS CELL CARCINOMA, MODERATELY TO POORLY DIFFERENTIATED WITH   FOCAL MINIMAL KERATINIZATION.[id number] IS POSITIVE, AND EBER ISH IS NEGATIVE.  [id number] -stage III.  weekly CarboTaxol-concurrent radiation  Changes made to treatment plan:  N/A  Follow up needed:  N/A   Bradley [name], [facility], 06/14/2024  12:21 PM

## 2024-06-18 ENCOUNTER — Ambulatory Visit

## 2024-06-19 ENCOUNTER — Ambulatory Visit

## 2024-06-20 ENCOUNTER — Ambulatory Visit
Admission: RE | Admit: 2024-06-20 | Discharge: 2024-06-20 | Disposition: A | Source: Ambulatory Visit | Attending: Radiation Oncology | Admitting: Radiation Oncology

## 2024-06-20 ENCOUNTER — Other Ambulatory Visit: Payer: Self-pay

## 2024-06-20 LAB — RAD ONC ARIA SESSION SUMMARY
Course Elapsed Days: 0
Plan Fractions Treated to Date: 1
Plan Prescribed Dose Per Fraction: 2 Gy
Plan Total Fractions Prescribed: 35
Plan Total Prescribed Dose: 70 Gy
Reference Point Dosage Given to Date: 2 Gy
Reference Point Session Dosage Given: 2 Gy
Session Number: 1

## 2024-06-21 ENCOUNTER — Ambulatory Visit
Admission: RE | Admit: 2024-06-21 | Discharge: 2024-06-21 | Disposition: A | Discharge status: Home / Self Care | Source: Ambulatory Visit | Attending: Radiation Oncology | Admitting: Radiation Oncology

## 2024-06-21 ENCOUNTER — Inpatient Hospital Stay

## 2024-06-21 ENCOUNTER — Inpatient Hospital Stay: Admitting: Internal Medicine

## 2024-06-21 ENCOUNTER — Encounter: Payer: Self-pay | Admitting: Internal Medicine

## 2024-06-21 ENCOUNTER — Other Ambulatory Visit: Payer: Self-pay

## 2024-06-21 VITALS — BP 108/60 | HR 68 | Temp 98.4°F | Resp 18 | Ht 66.0 in | Wt 159.0 lb

## 2024-06-21 VITALS — BP 104/61 | HR 76 | Temp 97.5°F | Resp 18

## 2024-06-21 DIAGNOSIS — C099 Malignant neoplasm of tonsil, unspecified: Secondary | ICD-10-CM

## 2024-06-21 LAB — CMP (CANCER CENTER ONLY)
ALT: 7 U/L (ref 0–44)
AST: 27 U/L (ref 15–41)
Albumin: 3.8 g/dL (ref 3.5–5.0)
Alkaline Phosphatase: 74 U/L (ref 38–126)
Anion gap: 11 (ref 5–15)
BUN: 44 mg/dL — ABNORMAL HIGH (ref 8–23)
CO2: 28 mmol/L (ref 22–32)
Calcium: 9.5 mg/dL (ref 8.9–10.3)
Chloride: 100 mmol/L (ref 98–111)
Creatinine: 0.94 mg/dL (ref 0.61–1.24)
GFR, Estimated: >60 mL/min
Glucose, Bld: 234 mg/dL — ABNORMAL HIGH (ref 70–99)
Potassium: 4.3 mmol/L (ref 3.5–5.1)
Sodium: 138 mmol/L (ref 135–145)
Total Bilirubin: 0.3 mg/dL (ref 0.0–1.2)
Total Protein: 6.8 g/dL (ref 6.5–8.1)

## 2024-06-21 LAB — RAD ONC ARIA SESSION SUMMARY
Course Elapsed Days: 1
Plan Fractions Treated to Date: 2
Plan Prescribed Dose Per Fraction: 2 Gy
Plan Total Fractions Prescribed: 35
Plan Total Prescribed Dose: 70 Gy
Reference Point Dosage Given to Date: 4 Gy
Reference Point Session Dosage Given: 2 Gy
Session Number: 2

## 2024-06-21 LAB — CBC WITH DIFFERENTIAL (CANCER CENTER ONLY)
Abs Immature Granulocytes: 0.04 10*3/uL (ref 0.00–0.07)
Basophils Absolute: 0 10*3/uL (ref 0.0–0.1)
Basophils Relative: 0 %
Eosinophils Absolute: 0.2 10*3/uL (ref 0.0–0.5)
Eosinophils Relative: 3 %
HCT: 37.6 % — ABNORMAL LOW (ref 39.0–52.0)
Hemoglobin: 11.9 g/dL — ABNORMAL LOW (ref 13.0–17.0)
Immature Granulocytes: 0 %
Lymphocytes Relative: 13 %
Lymphs Abs: 1.2 10*3/uL (ref 0.7–4.0)
MCH: 30.9 pg (ref 26.0–34.0)
MCHC: 31.6 g/dL (ref 30.0–36.0)
MCV: 97.7 fL (ref 80.0–100.0)
Monocytes Absolute: 0.6 10*3/uL (ref 0.1–1.0)
Monocytes Relative: 6 %
Neutro Abs: 7 10*3/uL (ref 1.7–7.7)
Neutrophils Relative %: 78 %
Platelet Count: 221 10*3/uL (ref 150–400)
RBC: 3.85 MIL/uL — ABNORMAL LOW (ref 4.22–5.81)
RDW: 13.7 % (ref 11.5–15.5)
WBC Count: 9.1 10*3/uL (ref 4.0–10.5)
nRBC: 0 % (ref 0.0–0.2)

## 2024-06-21 LAB — MAGNESIUM: Magnesium: 2.1 mg/dL (ref 1.7–2.4)

## 2024-06-21 LAB — PHOSPHORUS: Phosphorus: 3.5 mg/dL (ref 2.5–4.6)

## 2024-06-21 MED ORDER — SODIUM CHLORIDE 0.9 % IV SOLN
45.0000 mg/m2 | Freq: Once | INTRAVENOUS | Status: AC
  Administered 2024-06-21: 84 mg via INTRAVENOUS
  Filled 2024-06-21: qty 14

## 2024-06-21 MED ORDER — DEXAMETHASONE SOD PHOSPHATE PF 10 MG/ML IJ SOLN
10.0000 mg | Freq: Once | INTRAMUSCULAR | Status: AC
  Administered 2024-06-21: 10 mg via INTRAVENOUS
  Filled 2024-06-21: qty 1

## 2024-06-21 MED ORDER — SODIUM CHLORIDE 0.9 % IV SOLN
INTRAVENOUS | Status: DC
  Filled 2024-06-21: qty 250

## 2024-06-21 MED ORDER — PALONOSETRON HCL INJECTION 0.25 MG/5ML
0.2500 mg | Freq: Once | INTRAVENOUS | Status: AC
  Administered 2024-06-21: 0.25 mg via INTRAVENOUS
  Filled 2024-06-21: qty 5

## 2024-06-21 MED ORDER — SODIUM CHLORIDE 0.9 % IV SOLN
157.6000 mg | Freq: Once | INTRAVENOUS | Status: AC
  Administered 2024-06-21: 160 mg via INTRAVENOUS
  Filled 2024-06-21: qty 16

## 2024-06-21 MED ORDER — FAMOTIDINE IN NACL 20-0.9 MG/50ML-% IV SOLN
20.0000 mg | Freq: Once | INTRAVENOUS | Status: AC
  Administered 2024-06-21: 20 mg via INTRAVENOUS
  Filled 2024-06-21: qty 50

## 2024-06-21 MED ORDER — DIPHENHYDRAMINE HCL 50 MG/ML IJ SOLN
50.0000 mg | Freq: Once | INTRAMUSCULAR | Status: AC
  Administered 2024-06-21: 50 mg via INTRAVENOUS
  Filled 2024-06-21: qty 1

## 2024-06-21 NOTE — Progress Notes (Signed)
 C/o pain lt chest/flank area, lasted 5-10 minutes. Having a sore throat.

## 2024-06-21 NOTE — Patient Instructions (Signed)

## 2024-06-21 NOTE — Progress Notes (Signed)
 [facility] CONSULT NOTE  Patient Care Team: Diedra Lame, MD as PCP - General (Family Medicine) Rennie Cindy SAUNDERS, MD as Consulting Physician (Oncology) Lenn Aran, MD as Consulting Physician (Radiation Oncology) Borders, Fonda SAUNDERS, NP as Nurse Practitioner (Hospice and Palliative Medicine)  CHIEF COMPLAINTS/PURPOSE OF CONSULTATION: head and neck cancer  #  Oncology History  Tonsillar cancer (HCC)  05/22/2024 Initial Diagnosis   Tonsillar cancer (HCC)   05/22/2024 Cancer Staging   Staging form: Pharynx - Oropharynx (HPV-Associated), AJCC V9 - Clinical: Stage III (cT4, cN2, cM0, p16+) - Signed by Rennie Cindy SAUNDERS, MD on 05/22/2024   06/21/2024 -  Chemotherapy   Patient [name] on Treatment Plan : HEAD/NECK Carboplatin  + Paclitaxel  + XRT q7d       HISTORY OF PRESENTING ILLNESS:  Bradley [name] 89 y.o.  male with hx of Parkinson disease and tonsillar cancer- HPV positive stage III [name] here for a follow up.   Discussed the use of AI scribe software for clinical note transcription with the patient, who gave verbal consent to proceed.   History of Present Illness    Bradley [name] an 89 year old male with HPV-positive tonsillar cancer undergoing chemoradiation who presents for oncology follow-up during active treatment.  He [name] currently receiving concurrent chemotherapy and radiation therapy for HPV-positive tonsillar cancer, with his first radiation session completed yesterday. Following treatment, he developed throat discomfort, generalized soreness, and fatigue. He continues to experience dysphagia, though a recent swallowing evaluation was normal. He [name] able to consume regular food and supplement with gastrostomy tube feedings, resulting in weight gain to 159 lbs.  Last night, he developed abdominal pain localized to the gastrostomy tube site, which occurred overnight and was reported the following morning. He [name] using analgesics for  symptom control. The gastrostomy tube site was described as tight.  He has type 2 diabetes mellitus managed with metformin, one tablet in the morning and one at night. Blood glucose levels have been elevated, with a recent value of 234 mg/dL and a prior value of 679 mg/dL, likely related to increased intake of starches, ice cream, and cookies, and difficulty incorporating protein into his diet. He acknowledges weight gain due to suboptimal dietary habits during treatment.  He has access to antiemetic and analgesic medications, which his caregiver brought to the visit. There was discussion regarding the timing of antiemetic administration, but he has not experienced significant nausea to date.      Review of Systems  Constitutional:  Positive for malaise/fatigue and weight loss. Negative for chills, diaphoresis and fever.  HENT:  Negative for nosebleeds and sore throat.   Eyes:  Negative for double vision.  Respiratory:  Negative for cough, hemoptysis, sputum production, shortness of breath and wheezing.   Cardiovascular:  Negative for chest pain, palpitations, orthopnea and leg swelling.  Gastrointestinal:  Negative for abdominal pain, blood in stool, constipation, diarrhea, heartburn, melena, nausea and vomiting.  Genitourinary:  Negative for dysuria, frequency and urgency.  Musculoskeletal:  Positive for back pain and joint pain.  Skin: Negative.  Negative for itching and rash.  Neurological:  Positive for dizziness, tingling, tremors and weakness. Negative for focal weakness and headaches.  Endo/Heme/Allergies:  Does not bruise/bleed easily.  Psychiatric/Behavioral:  Negative for depression. The patient [name] not nervous/anxious and does not have insomnia.      MEDICAL HISTORY:  Past Medical History:  Diagnosis Date   Anemia    BPH (benign prostatic hypertrophy)    COPD (chronic obstructive pulmonary  disease) (HCC)    Diabetes type 2, controlled (HCC)    GERD (gastroesophageal reflux  disease)    RARE   GI bleed    2024   Glaucoma    Syndor   Hearing loss    bilateral hearing aids occasionally   History of kidney stones    HLD (hyperlipidemia)    HTN (hypertension)    Macular degeneration    Right (Appenseler)   Neuromuscular disorder (HCC)    Osteoarthritis    thumbs, hips, knees (s/p R TKR)   Parkinson disease (HCC)    PUD (peptic ulcer disease) 03/2013   2 antral gastric ulcers and 1 duodenal ulcer, NSAID related, admitted to [facility] with melena/anemia s/p EGD   Sleep apnea    CPAP   Tremor    intention    SURGICAL HISTORY: Past Surgical History:  Procedure Laterality Date   BELPHAROPTOSIS REPAIR  2012   BIOPSY  11/10/2022   Procedure: BIOPSY;  Surgeon: Maryruth Ole DASEN, MD;  Location: [facility] ENDOSCOPY;  Service: Endoscopy;;   carotid US   12/2011   no significant stenosis   CATARACT EXTRACTION Bilateral 2011, 2012   CHOLECYSTECTOMY  1997   COLONOSCOPY  2009   per patient, told no longer needed   CREATION, GASTROSTOMY, OPEN N/A 06/01/2024   Procedure: CREATION, GASTROSTOMY, OPEN;  Surgeon: Rodolph Romano, MD;  Location: [facility] ORS;  Service: General;  Laterality: N/A;   CYSTOSCOPY W/ URETERAL STENT PLACEMENT Left 11/01/2016   Procedure: CYSTOSCOPY WITH STENT REPLACEMENT;  Surgeon: Penne Knee, MD;  Location: [facility] ORS;  Service: Urology;  Laterality: Left;   CYSTOSCOPY WITH STENT PLACEMENT Left 10/26/2016   Procedure: CYSTOSCOPY WITH STENT PLACEMENT;  Surgeon: Penne Knee, MD;  Location: [facility] ORS;  Service: Urology;  Laterality: Left;   ESOPHAGOGASTRODUODENOSCOPY  04/2013   hospitalization @ [facility], gastritis, gastric ulcers and duodenal ulcer with clean base (Rein)   ESOPHAGOGASTRODUODENOSCOPY (EGD) WITH PROPOFOL  N/A 11/10/2022   Procedure: ESOPHAGOGASTRODUODENOSCOPY (EGD) WITH PROPOFOL ;  Surgeon: Maryruth Ole DASEN, MD;  Location: [facility] ENDOSCOPY;  Service: Endoscopy;  Laterality: N/A;   HOLEP-LASER ENUCLEATION OF THE PROSTATE WITH MORCELLATION  N/A 11/01/2016   Procedure: HOLEP-LASER ENUCLEATION OF THE PROSTATE WITH MORCELLATION;  Surgeon: Penne Knee, MD;  Location: [facility] ORS;  Service: Urology;  Laterality: N/A;   KIDNEY STONE SURGERY  31, 69, 90   PORTACATH PLACEMENT N/A 06/01/2024   Procedure: INSERTION, TUNNELED CENTRAL VENOUS DEVICE, WITH PORT;  Surgeon: Rodolph Romano, MD;  Location: [facility] ORS;  Service: General;  Laterality: N/A;   TOTAL KNEE ARTHROPLASTY Right 2006   URETEROSCOPY Left 10/26/2016   Procedure: URETEROSCOPY;  Surgeon: Penne Knee, MD;  Location: [facility] ORS;  Service: Urology;  Laterality: Left;   URETEROSCOPY WITH HOLMIUM LASER LITHOTRIPSY Left 11/01/2016   Procedure: URETEROSCOPY WITH HOLMIUM LASER LITHOTRIPSY;  Surgeon: Penne Knee, MD;  Location: [facility] ORS;  Service: Urology;  Laterality: Left;   US  ECHOCARDIOGRAPHY  12/2011   nl LV fxn, EF 60%, nl valves, dilated aortic root (40mm)    SOCIAL HISTORY: Social History   Socioeconomic History   Marital status: Married    Spouse name: Not on file   Number of children: Not on file   Years of education: Not on file   Highest education level: Not on file  Occupational History   Not on file  Tobacco Use   Smoking status: Former    Current packs/day: 0.00    Average packs/day: 1 pack/day for 45.0 years (45.0 ttl pk-yrs)  Types: Cigarettes, Cigars    Start date: 05/24/1998    Quit date: 10/23/1998    Years since quitting: 25.6   Smokeless tobacco: Never  Vaping Use   Vaping status: Never Used  Substance and Sexual Activity   Alcohol use: Not Currently    Comment: Occasional, maybe 5-6 per year   Drug use: No   Sexual activity: Not Currently  Other Topics Concern   Not on file  Social History Narrative   stYgerwaltLives with wife, 2 dogsGrown children by first 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   Social Drivers of Health   Tobacco Use: Medium Risk  (06/21/2024)   Patient History    Smoking Tobacco Use: Former    Smokeless Tobacco Use: Never    Passive Exposure: Not on file  Financial Resource Strain: Low Risk  (11/15/2023)   [facility]   Overall Financial Resource Strain (CARDIA)    Difficulty of Paying Living Expenses: Not hard at all  Food Insecurity: No Food Insecurity (05/22/2024)   Epic    Worried About Programme Researcher, Broadcasting/film/video in the Last Year: Never true    Ran Out of Food in the Last Year: Never true  Transportation Needs: No Transportation Needs (05/22/2024)   Epic    Lack of Transportation (Medical): No    Lack of Transportation (Non-Medical): No  Physical Activity: Not on file  Stress: Not on file  Social Connections: Not on file  Intimate Partner Violence: Not At Risk (11/09/2022)   Humiliation, Afraid, Rape, and Kick questionnaire    Fear of Current or Ex-Partner: No    Emotionally Abused: No    Physically Abused: No    Sexually Abused: No  Depression (PHQ2-9): Low Risk (06/21/2024)   Depression (PHQ2-9)    PHQ-2 Score: 0  Alcohol Screen: Not on file  Housing: Low Risk (05/22/2024)   Epic    Unable to Pay for Housing in the Last Year: No    Number of Times Moved in the Last Year: 0    Homeless in the Last Year: No  Utilities: Not At Risk (05/22/2024)   Epic    Threatened with loss of utilities: No  Health Literacy: Not on file    FAMILY HISTORY: Family History  Problem Relation Age of Onset   Cancer Sister        lung, nonsmoker   Cancer Sister        lung, nonsmoker   Dementia Mother    CAD Father        heart problems   CAD Brother 21       MI   Stroke Neg Hx    Prostate cancer Neg Hx    Bladder Cancer Neg Hx    Kidney cancer Neg Hx     ALLERGIES:  [name] allergic to sulfa antibiotics and penicillins.  MEDICATIONS:  Current Outpatient Medications  Medication Sig Dispense Refill   acetaminophen  (TYLENOL ) 500 MG tablet Take 1,000 mg by mouth daily as needed for  headache, fever, moderate pain or mild pain.     albuterol  (VENTOLIN  HFA) 108 (90 Base) MCG/ACT inhaler Inhale 1-2 puffs into the lungs every 4 (four) hours as needed for shortness of breath or wheezing.     aspirin  EC (ASPIRIN  81) 81 MG tablet Take 1 tablet (81 mg total) by mouth daily. 30 tablet 12   atorvastatin  (LIPITOR) 20 MG tablet Take 20  mg by mouth every evening.      Biotin w/ Vitamins C & E (HAIR/SKIN/NAILS PO) Take 1,666 mg by mouth 2 (two) times daily.     brimonidine -timolol  (COMBIGAN ) 0.2-0.5 % ophthalmic solution Place 1 drop into both eyes 2 (two) times daily.      buPROPion  (WELLBUTRIN  XL) 150 MG 24 hr tablet Take 150 mg by mouth every morning.     carbidopa -levodopa  (SINEMET  CR) 50-200 MG tablet Take 1 tablet by mouth at bedtime.     carbidopa -levodopa  (SINEMET  IR) 25-100 MG tablet Take 2 tablets by mouth 4 (four) times daily. Extended release Taken at 0730, 1130, 1500 and 1830     Cholecalciferol  (VITAMIN D3) 1000 units CAPS Take 1,000 Units by mouth 2 (two) times daily.     docusate sodium  (COLACE) 100 MG capsule Take 100 mg by mouth 2 (two) times daily.     finasteride  (PROSCAR ) 5 MG tablet Take 5 mg by mouth every morning.      fluticasone (FLONASE) 50 MCG/ACT nasal spray Place 1 spray into both nostrils daily.     HYDROcodone -acetaminophen  (NORCO/VICODIN) 5-325 MG tablet Take 1 tablet by mouth every 4 (four) hours as needed for moderate pain (pain score 4-6). 60 tablet 0   ipratropium (ATROVENT ) 0.06 % nasal spray Place 1 spray into both nostrils 2 (two) times daily as needed for rhinitis.     lamoTRIgine (LAMICTAL) 25 MG tablet Take 25 mg by mouth 2 (two) times daily.     lidocaine -prilocaine  (EMLA ) cream Apply 1 Application topically as needed. 30 g 0   Melatonin 10 MG TABS Take 10 mg by mouth at bedtime.     metFORMIN (GLUCOPHAGE-XR) 500 MG 24 hr tablet Take 500 mg by mouth 2 (two) times daily between meals as needed.     Multiple Vitamins-Minerals (PRESERVISION AREDS 2  PO) Take 1 tablet by mouth 2 (two) times daily.     Nutritional Supplements (NUTREN 1.5) LIQD Give 2 cartons via bolus syringe at 8am and noon and 1 carton at 4pm and 8pm.  Flush with 60ml of water  before and after each feeding.  Flush with additional 240ml of water  TID between feedings for additional hydration.     ondansetron  (ZOFRAN ) 8 MG tablet Take 1 tablet (8 mg total) by mouth every 8 (eight) hours as needed for nausea or vomiting. 20 tablet 0   pantoprazole  (PROTONIX ) 40 MG tablet Take 1 tablet (40 mg total) by mouth 2 (two) times daily. 120 tablet 0   polyethylene glycol powder (GLYCOLAX /MIRALAX ) 17 GM/SCOOP powder Take 17 g by mouth daily.     prochlorperazine  (COMPAZINE ) 10 MG tablet Take 1 tablet (10 mg total) by mouth every 6 (six) hours as needed for nausea or vomiting. 30 tablet 0   rOPINIRole  (REQUIP ) 0.5 MG tablet Take 0.5 mg by mouth QID. 8 am, 1130, 1500, 1830     sertraline  (ZOLOFT ) 100 MG tablet Take 100 mg by mouth daily.     torsemide (DEMADEX) 20 MG tablet Take 10 mg by mouth daily. Takes M-Sat am     traMADol  (ULTRAM ) 50 MG tablet Take 1 tablet (50 mg total) by mouth every 6 (six) hours as needed. 10 tablet 0   No current facility-administered medications for this visit.   Facility-Administered Medications Ordered in Other Visits  Medication Dose Route Frequency Provider Last Rate Last Admin   0.9 %  sodium chloride  infusion   Intravenous Continuous Karryn Kosinski R, MD       CARBOplatin  (  PARAPLATIN ) 160 mg in sodium chloride  0.9 % 100 mL chemo infusion  160 mg Intravenous Once Iylah Dworkin R, MD       dexamethasone  (DECADRON ) injection 10 mg  10 mg Intravenous Once Christol Thetford R, MD       diphenhydrAMINE  (BENADRYL ) injection 50 mg  50 mg Intravenous Once Liticia Gasior R, MD       famotidine  (PEPCID ) IVPB 20 mg premix  20 mg Intravenous Once Marilene Vath R, MD       PACLitaxel  (TAXOL ) 84 mg in sodium chloride  0.9 % 250 mL chemo infusion  (</= 80mg /m2)  45 mg/m2 (Treatment Plan Recorded) Intravenous Once Hodan Wurtz R, MD       palonosetron  (ALOXI ) injection 0.25 mg  0.25 mg Intravenous Once Javious Hallisey R, MD       PHYSICAL EXAMINATION:   Vitals:   06/21/24 0841  BP: 108/60  Pulse: 68  Resp: 18  Temp: 98.4 F (36.9 C)  SpO2: 96%    Filed Weights   06/21/24 0841  Weight: 159 lb (72.1 kg)    Bilateral neck adenopathy-the left tonsillar lesion cannot be evaluated today.   Physical Exam Vitals and nursing note reviewed.  HENT:     Head: Normocephalic and atraumatic.     Mouth/Throat:     Pharynx: Oropharynx [name] clear.  Eyes:     Extraocular Movements: Extraocular movements intact.     Pupils: Pupils are equal, round, and reactive to light.  Cardiovascular:     Rate and Rhythm: Normal rate and regular rhythm.  Pulmonary:     Comments: Decreased breath sounds bilaterally.  Abdominal:     Palpations: Abdomen [name] soft.  Musculoskeletal:        General: Normal range of motion.     Cervical back: Normal range of motion.  Skin:    General: Skin [name] warm.  Neurological:     General: No focal deficit present.     Mental Status: He [name] alert and oriented to person, place, and time.  Psychiatric:        Behavior: Behavior normal.        Judgment: Judgment normal.      LABORATORY DATA:  I have reviewed the data as listed Lab Results  Component Value Date   WBC 9.1 06/21/2024   HGB 11.9 (L) 06/21/2024   HCT 37.6 (L) 06/21/2024   MCV 97.7 06/21/2024   PLT 221 06/21/2024   Recent Labs    06/07/24 1402 06/11/24 1016 06/21/24 0828  NA 140 139 138  K 4.4 4.5 4.3  CL 99 99 100  CO2 31 33* 28  GLUCOSE 155* 310* 234*  BUN 37* 32* 44*  CREATININE 0.92 0.92 0.94  CALCIUM  9.6 9.7 9.5  GFRNONAA >60 >60 >60  PROT 7.1 6.9 6.8  ALBUMIN 4.1 3.8 3.8  AST 48* 35 27  ALT 26 12 7   ALKPHOS 93 83 74  BILITOT 0.3 0.4 0.3    RADIOGRAPHIC STUDIES: I have personally reviewed the radiological  images as listed and agreed with the findings in the report. DG Chest Port 1 View Result Date: 06/01/2024 CLINICAL DATA:  Port-A-Cath placement, head and neck cancer EXAM: PORTABLE CHEST 1 VIEW COMPARISON:  05/16/2024 FINDINGS: Single frontal view of the chest demonstrates right chest wall port via internal jugular approach, tip overlying superior vena cava. Cardiac silhouette [name] enlarged. No acute airspace disease, effusion, or pneumothorax. No acute bony abnormalities. IMPRESSION: 1. No complication after right chest wall port placement.  Electronically Signed   By: Ozell Daring M.D.   On: 06/01/2024 15:56   DG C-Arm 1-60 Min-No Report Result Date: 06/01/2024 Fluoroscopy was utilized by the requesting physician.  No radiographic interpretation.   DG SWALLOW FUNC OP MEDICARE SPEECH PATH Result Date: 05/25/2024 Table formatting from the original result was not included. 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 Since detection, the left neck mass has increased in size, and a new contralateral neck mass developed over the past 1-2 days. He describes the left neck mass as feeling like a rubber ball. Relevant comorbidities include Parkinson's disease with progressive hand and jaw tremors, difficulty with fine motor tasks, cognitive decline, and finger numbness. He [name] legally blind (vision 20/2000) but remains independent in activities of daily living with assistive devices. He has type 2 diabetes and a remote tobacco use history, having quit in 2000 after a 30-year history.     Per pt and his wife's rpeort, he [name] currently seeing a speech therapist at [facility] twice a week for cognition. Per chart he also  participated in [facility] (01/08/2019 thru 02/12/2019).     CT Soft Tissue Neck 04/13/2024  There [name] an irregular soft tissue mass involving the left base of tongue with  effacement of the vallecula and extension along the lingual epiglottic surface.  There [name] extension deep into the floor of mouth, more pronounced on the left  though also involving the right. In total, the irregular soft tissue mass  measures approximately 4.8 x 4.3 x 4.7 cm (AP x TR x CC) and involves the left  lateral pharyngeal sidewall. There [name] extension to involve the contralateral  right base of tongue region as well. Clinical Impression: Pt presents with overall adequate oropharyngeal abilities when consuming thin liquids, nectar thick liquids, puree, graham cracker with barium paste and whole barium tablet with thin liquids. When consuming puree and soft solids, pt was observed grimacing with report of increased pain to 8 out of 10 therefore longer more deliberate handling of the boluses orally was observed. No aspiration or penetration was observed. Extensive education provided on the results of this study, aspiration precautions, diet recommendation as well as Outpatient ST services targeting perseveration of swallow function during radiation. All questions were answered to pt and his wife's satisfaction. Factors that may increase risk of adverse event in presence of aspiration Noe & Lianne 2021): Factors that may increase risk of adverse event in presence of aspiration Noe & Lianne 2021): Reduced cognitive function (upcoming radiation of HNC cancer, pain with solids, tumor presence) Recommendations/Plan: Swallowing Evaluation Recommendations Swallowing Evaluation Recommendations Recommendations: PO diet PO Diet Recommendation: Regular; Thin liquids (Level 0) Liquid Administration via: Cup; Straw Medication Administration: Whole meds with liquid Supervision: Patient able to self-feed Swallowing strategies  : Minimize  environmental distractions; Slow rate; Small bites/sips Postural changes: Position pt fully upright for meals; Stay upright 30-60 min after meals Oral care recommendations: Oral care BID (2x/day) Treatment Plan Treatment Plan Treatment recommendations: No treatment recommended at this time Follow-up recommendations: Outpatient SLP (to follow throughout [facility] treatment) Recommendations Recommendations for follow up therapy are one component of a multi-disciplinary discharge planning process, led by the attending physician.  Recommendations may be updated based on patient status, additional functional criteria  and insurance authorization. Assessment: Orofacial Exam: Orofacial Exam Oral Cavity: Oral Hygiene: WFL Oral Cavity - Dentition: Dentures, top; Dentures, bottom Orofacial Anatomy: WFL Oral Motor/Sensory Function: WFL (reports increased pain during lingual lateralization) Anatomy: Anatomy: WFL Boluses Administered: Boluses Administered Boluses Administered: Thin liquids (Level 0); Mildly thick liquids (Level 2, nectar thick); Puree; Solid  Oral Impairment Domain: Oral Impairment Domain Lip Closure: No labial escape Tongue control during bolus hold: Cohesive bolus between tongue to palatal seal Bolus preparation/mastication: Timely and efficient chewing and mashing (when consuming puree and soft solids, pt grimacing with report of increased pain to 8 out of 10 therefore longer more deliberate handling of boluses orally) Bolus transport/lingual motion: Brisk tongue motion Oral residue: Trace residue lining oral structures Location of oral residue : Tongue Initiation of pharyngeal swallow : Valleculae  Pharyngeal Impairment Domain: Pharyngeal Impairment Domain Soft palate elevation: No bolus between soft palate (SP)/pharyngeal wall (PW) Laryngeal elevation: Complete superior movement of thyroid cartilage with complete approximation of arytenoids to epiglottic petiole Anterior hyoid excursion: Complete anterior movement  Epiglottic movement: Complete inversion Laryngeal vestibule closure: Complete, no air/contrast in laryngeal vestibule Pharyngeal stripping wave : Present - complete Pharyngoesophageal segment opening: Complete distension and complete duration, no obstruction of flow Tongue base retraction: Trace column of contrast or air between tongue base and PPW Pharyngeal residue: Trace residue within or on pharyngeal structures Location of pharyngeal residue: Pharyngeal wall; Tongue base  Esophageal Impairment Domain: Esophageal Impairment Domain Esophageal clearance upright position: Complete clearance, esophageal coating Pill: Pill Consistency administered: Thin liquids (Level 0) Thin liquids (Level 0): [facility] Penetration/Aspiration Scale Score: Penetration/Aspiration Scale Score 1.  Material does not enter airway: Thin liquids (Level 0); Mildly thick liquids (Level 2, nectar thick); Puree; Solid; Pill Compensatory Strategies: No data recorded  General Information: Caregiver present: Yes  Diet Prior to this Study: Regular; Thin liquids (Level 0)   Temperature : Normal   Respiratory Status: WFL   Supplemental O2: None (Room air)   History of Recent Intubation: No  Behavior/Cognition: Alert; Cooperative; Pleasant mood Self-Feeding Abilities: Able to self-feed Baseline vocal quality/speech: Normal Volitional Cough: Able to elicit Volitional Swallow: Able to elicit Exam Limitations: No limitations Goal Planning: No data recorded No data recorded No data recorded Patient/Family Stated Goal: to presevere swallow function throughout treatment for [facility] Consulted and agree with results and recommendations: Patient; Family member/caregiver; Physician; Nurse Pain: Pain Assessment Pain Assessment: No/denies pain (increaed to 8 out of 10 (posterior oral cavity/base of tongue) when consuming puree and soft solids) End of Session: Start Time:SLP Start Time (ACUTE ONLY): 0930 Stop Time: SLP Stop Time (ACUTE ONLY): 1015 Time Calculation:SLP Time  Calculation (min) (ACUTE ONLY): 45 min Charges: SLP Evaluations $ SLP Speech Visit: 1 Visit SLP Evaluations $MBS Swallow: 1 Procedure $Self Care/Home Management: 8-22 SLP visit diagnosis: SLP Visit Diagnosis: Dysphagia, unspecified (R13.10) Past Medical History: Past Medical History: Diagnosis Date  Anemia   BPH (benign prostatic hypertrophy)   Diabetes type 2, controlled (HCC)   GERD (gastroesophageal reflux disease)   RARE  Glaucoma   Syndor  Hearing loss   bilateral hearing aids occasionally  History of kidney stones   HLD (hyperlipidemia)   HTN (hypertension)   Macular degeneration   Right (Appenseler)  Osteoarthritis   thumbs, hips, knees (s/p R TKR)  PUD (peptic ulcer disease) 03/2013  2 antral gastric ulcers and 1 duodenal ulcer, NSAID related, admitted to [facility] with melena/anemia s/p EGD  Sleep apnea   CPAP  Tremor   intention Past  Surgical History: Past Surgical History: Procedure Laterality Date  BELPHAROPTOSIS REPAIR  2012  BIOPSY  11/10/2022  Procedure: BIOPSY;  Surgeon: Maryruth Ole DASEN, MD;  Location: [facility];  Service: Endoscopy;;  carotid US   12/2011  no significant stenosis  CATARACT EXTRACTION Bilateral 2011, 2012  CHOLECYSTECTOMY  1997  COLONOSCOPY  2009  per patient, told no longer needed  CYSTOSCOPY W/ URETERAL STENT PLACEMENT Left 11/01/2016  Procedure: CYSTOSCOPY WITH STENT REPLACEMENT;  Surgeon: Penne Knee, MD;  Location: [facility] ORS;  Service: Urology;  Laterality: Left;  CYSTOSCOPY WITH STENT PLACEMENT Left 10/26/2016  Procedure: CYSTOSCOPY WITH STENT PLACEMENT;  Surgeon: Penne Knee, MD;  Location: [facility];  Service: Urology;  Laterality: Left;  ESOPHAGOGASTRODUODENOSCOPY  04/2013  hospitalization @ [facility], gastritis, gastric ulcers and duodenal ulcer with clean base (Rein)  ESOPHAGOGASTRODUODENOSCOPY (EGD) WITH PROPOFOL  N/A 11/10/2022  Procedure: ESOPHAGOGASTRODUODENOSCOPY (EGD) WITH PROPOFOL ;  Surgeon: Maryruth Ole DASEN, MD;  Location: [facility];  Service: Endoscopy;   Laterality: N/A;  HOLEP-LASER ENUCLEATION OF THE PROSTATE WITH MORCELLATION N/A 11/01/2016  Procedure: HOLEP-LASER ENUCLEATION OF THE PROSTATE WITH MORCELLATION;  Surgeon: Penne Knee, MD;  Location: [facility];  Service: Urology;  Laterality: N/A;  KIDNEY STONE SURGERY  85, 89, 89  TOTAL KNEE ARTHROPLASTY Right 2006  URETEROSCOPY Left 10/26/2016  Procedure: URETEROSCOPY;  Surgeon: Penne Knee, MD;  Location: [facility];  Service: Urology;  Laterality: Left;  URETEROSCOPY WITH HOLMIUM LASER LITHOTRIPSY Left 11/01/2016  Procedure: URETEROSCOPY WITH HOLMIUM LASER LITHOTRIPSY;  Surgeon: Penne Knee, MD;  Location: [facility];  Service: Urology;  Laterality: Left;  US  ECHOCARDIOGRAPHY  12/2011  nl LV fxn, EF 60%, nl valves, dilated aortic root (40mm) Happi Overton 05/25/2024, 4:20 PM CLINICAL DATA:  Patient with dysphagia following head and neck radiation. EXAM: MODIFIED BARIUM SWALLOW TECHNIQUE: Radiologist, not in attendance for the exam. Different consistencies of barium were administered orally to the patient by the Speech Pathologist. Imaging of the pharynx was performed in the lateral projection. The radiology APP, Brittany Huneycutt, NP, was present in the fluoroscopy room for this study, providing personal supervision. FLUOROSCOPY: Radiation Exposure Index (as provided by the fluoroscopic device): 13.2 mGy Kerma COMPARISON:  04/13/24 CT Soft Tissue Neck FINDINGS: Vestibular  Penetration:  None seen. Aspiration:  None seen. Other:  None. IMPRESSION: No vestibular penetration or aspiration seen. Please refer to the Speech Pathologists report for complete details and recommendations. Electronically Signed   By: Wilkie Lent M.D.   On: 05/25/2024 13:02   ASSESSMENT & PLAN:   Tonsillar cancer (HCC) # PET DEC, 23rd, 2025- [Dr.McQueen]- Hypermetabolic uptake in the inferior oropharynx along the left floor of mouth with extension up into the posterior left nasopharyngeal region. Imaging features compatible  with known primary neoplasm;  Hypermetabolic bilateral level II and left level III cervical lymph nodes compatible with metastatic disease. No evidence for hypermetabolic metastatic disease in the chest, abdomen, or pelvis.  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  rU5W7 -stage III. # Recommend proceeding with weekly CarboTaxol-concurrent radiation [Jan 29th through March 16th].    # Proceed with cycle #1 of carbo-Taxol  #1 of planned 6-8 cycles. Labs-CBC/chemistries were reviewed with the patient.   # Diabetes- not controlled-recommend compliance with medications- on metformin 500 XL BID; if significantly elevated recommend glipizide.  Recommend discretion   # Parkinson- [walks with cane; Dr.Shah]- Hx  of falls- On lamictal- for mood swings-monitor neuropathy and tremors closely given the risk of falls- monitor closely on taxol .   # Nutrition: S/P PEG tube [Dr.Cintron]  # IV access: Mediport [Dr.Cintron];   # ACP:s/p JOSH.   # weekly carbo-taxol - MD;labs-x3; PS  # DISPOSITION: # chemo today # in 1 week- MD; labs- cbc/cmp;chemo # # in 2 week- MD; labs- cbc/cmp;chemo # in 3 week- MD; labs- cbc/cmp;chemo Dr.B-   All questions were answered. The patient knows to call the clinic with any problems, questions or concerns.    Cindy JONELLE [name], MD 06/21/2024 9:52 AM

## 2024-06-21 NOTE — Assessment & Plan Note (Addendum)
#   PET DEC, 23rd, 2025- [Dr.McQueen]- Hypermetabolic uptake in the inferior oropharynx along the left floor of mouth with extension up into the posterior left nasopharyngeal region. Imaging features compatible with known primary neoplasm;  Hypermetabolic bilateral level II and left level III cervical lymph nodes compatible with metastatic disease. No evidence for hypermetabolic metastatic disease in the chest, abdomen, or pelvis.  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  rU5W7 -stage III. # Recommend proceeding with weekly CarboTaxol-concurrent radiation [Jan 29th through March 16th].    # Proceed with cycle #1 of carbo-Taxol  #1 of planned 6-8 cycles. Labs-CBC/chemistries were reviewed with the patient.   # Diabetes- not controlled-recommend compliance with medications- on metformin 500 XL BID; if significantly elevated recommend glipizide.  Recommend discretion   # Parkinson- [walks with cane; Dr.Shah]- Hx of falls- On lamictal- for mood swings-monitor neuropathy and tremors closely given the risk of falls- monitor closely on taxol .   # Nutrition: S/P PEG tube [Dr.Cintron]  # IV access: Mediport [Dr.Cintron];   # ACP:s/p JOSH.   # weekly carbo-taxol - MD;labs-x3; PS  # DISPOSITION: # chemo today # in 1 week- MD; labs- cbc/cmp;chemo # # in 2 week- MD; labs- cbc/cmp;chemo # in 3 week- MD; labs- cbc/cmp;chemo Dr.B-

## 2024-06-21 NOTE — Progress Notes (Addendum)
 Nutrition Follow-up:  Patient with HPV stage III tonsillar cancer.  Patient started radiation yesterday and chemotherapy today.    Met with patient and wife during infusion.  Wife reports that patient is eating orally and jaw pain is better.  Eating scrambled eggs, oatmeal, 2 premier protein shakes daily, hamburger, shrimp, baked potato, ice cream, cookies.  Was eating peanut butter nabs during infusion.  Drinking bai drinks, water , coffee, diet soda.    Wife is giving 5 cartons of nutren 1.5 (2 cartons, 2 cartons and then 1 carton) via bolus feeding.  Flushing with at each feeding.  Patient says he is tolerating well.  Did have abdominal pain around 2am this morning at G-tube site.  Put his hand on site for few minutes and then pain went away.  Having bowel movement 1-2 times a day.  Taking miralax  daily.    Observed G-tube site.  Noted sutures in external bolster.    Medications: reviewed  Labs: glucose 234, BUN 44, phosphorus, Mag and K WNL  Anthropometrics:   Weight  159 lb today  155 lb 7 oz on 1/22 158 lb on 1/12 168 lb on 08/25/23   Estimated Energy Needs  Kcals: 1800-2160 Protein: 86-108 g Fluid: > 1800 ml  NUTRITION DIAGNOSIS: Inadequate oral intake continues, relying on feeding tube   INTERVENTION:  Reduce tube feeding to 4 cartons daily with continued good oral intake.  Wife will give 2 cartons at a feeding, then 1 carton and 1 carton.  Water  flush at each feeding will be 180 ml.  Continue drinking at least 2 cups of fluid orally. Continue eating foods orally as able Contacted surgeon for follow-up appointment for suture removal Continue bowel regimen    MONITORING, EVALUATION, GOAL: weight trends, intake, tube feeding tolerance   NEXT VISIT: Thursday, Feb 5 during infusion  Maven Rosander B. Dasie SOLON, CSO, LDN Registered Dietitian [phone]

## 2024-06-21 NOTE — Patient Instructions (Signed)
 [facility] - A DEPT OF [facility]  Discharge Instructions: Thank you for choosing [facility] to provide your oncology and hematology care.  If you have a lab appointment with the Cancer Center, please go directly to the Cancer Center and check in at the registration area.  Wear comfortable clothing and clothing appropriate for easy access to any Portacath or PICC line.   We strive to give you quality time with your provider. You may need to reschedule your appointment if you arrive late (15 or more minutes).  Arriving late affects you and other patients whose appointments are after yours.  Also, if you miss three or more appointments without notifying the office, you may be dismissed from the clinic at the providers discretion.      For prescription refill requests, have your pharmacy contact our office and allow 72 hours for refills to be completed.    Today you received the following chemotherapy and/or immunotherapy agents: CARBOplatin  (PARAPLATIN ),  PACLitaxel  (TAXOL )      To help prevent nausea and vomiting after your treatment, we encourage you to take your nausea medication as directed.  BELOW ARE SYMPTOMS THAT SHOULD BE REPORTED IMMEDIATELY: *FEVER GREATER THAN 100.4 F (38 C) OR HIGHER *CHILLS OR SWEATING *NAUSEA AND VOMITING THAT IS NOT CONTROLLED WITH YOUR NAUSEA MEDICATION *UNUSUAL SHORTNESS OF BREATH *UNUSUAL BRUISING OR BLEEDING *URINARY PROBLEMS (pain or burning when urinating, or frequent urination) *BOWEL PROBLEMS (unusual diarrhea, constipation, pain near the anus) TENDERNESS IN MOUTH AND THROAT WITH OR WITHOUT PRESENCE OF ULCERS (sore throat, sores in mouth, or a toothache) UNUSUAL RASH, SWELLING OR PAIN  UNUSUAL VAGINAL DISCHARGE OR ITCHING   Items with * indicate a potential emergency and should be followed up as soon as possible or go to the Emergency Department if any problems should occur.  Please show the  CHEMOTHERAPY ALERT CARD or IMMUNOTHERAPY ALERT CARD at check-in to the Emergency Department and triage nurse.  Should you have questions after your visit or need to cancel or reschedule your appointment, please contact [facility] - A DEPT OF [facility]  [phone] and follow the prompts.  Office hours are 8:00 a.m. to 4:30 p.m. Monday - Friday. Please note that voicemails left after 4:00 p.m. may not be returned until the following business day.  We are closed weekends and major holidays. You have access to a nurse at all times for urgent questions. Please call the main number to the clinic [phone] and follow the prompts.  For any non-urgent questions, you may also contact your provider using MyChart. We now offer e-Visits for anyone 54 and older to request care online for non-urgent symptoms. For details visit [email or web address].   Also download the MyChart app! Go to the app store, search MyChart, open the app, select [facility], and log in with your MyChart username and password.

## 2024-06-22 ENCOUNTER — Ambulatory Visit
Admission: RE | Admit: 2024-06-22 | Discharge: 2024-06-22 | Disposition: A | Source: Ambulatory Visit | Attending: Radiation Oncology | Admitting: Radiation Oncology

## 2024-06-22 ENCOUNTER — Other Ambulatory Visit: Payer: Self-pay

## 2024-06-22 LAB — RAD ONC ARIA SESSION SUMMARY
Course Elapsed Days: 2
Plan Fractions Treated to Date: 3
Plan Prescribed Dose Per Fraction: 2 Gy
Plan Total Fractions Prescribed: 35
Plan Total Prescribed Dose: 70 Gy
Reference Point Dosage Given to Date: 6 Gy
Reference Point Session Dosage Given: 2 Gy
Session Number: 3

## 2024-06-25 ENCOUNTER — Ambulatory Visit

## 2024-06-26 ENCOUNTER — Other Ambulatory Visit: Payer: Self-pay

## 2024-06-26 ENCOUNTER — Ambulatory Visit
Admission: RE | Admit: 2024-06-26 | Discharge: 2024-06-26 | Disposition: A | Source: Ambulatory Visit | Attending: Radiation Oncology | Admitting: Radiation Oncology

## 2024-06-26 LAB — RAD ONC ARIA SESSION SUMMARY
Course Elapsed Days: 6
Plan Fractions Treated to Date: 4
Plan Prescribed Dose Per Fraction: 2 Gy
Plan Total Fractions Prescribed: 35
Plan Total Prescribed Dose: 70 Gy
Reference Point Dosage Given to Date: 8 Gy
Reference Point Session Dosage Given: 2 Gy
Session Number: 4

## 2024-06-27 ENCOUNTER — Ambulatory Visit
Admission: RE | Admit: 2024-06-27 | Discharge: 2024-06-27 | Disposition: A | Source: Ambulatory Visit | Attending: Radiation Oncology | Admitting: Radiation Oncology

## 2024-06-27 ENCOUNTER — Other Ambulatory Visit: Payer: Self-pay

## 2024-06-27 LAB — RAD ONC ARIA SESSION SUMMARY
Course Elapsed Days: 7
Plan Fractions Treated to Date: 5
Plan Prescribed Dose Per Fraction: 2 Gy
Plan Total Fractions Prescribed: 35
Plan Total Prescribed Dose: 70 Gy
Reference Point Dosage Given to Date: 10 Gy
Reference Point Session Dosage Given: 2 Gy
Session Number: 5

## 2024-06-28 ENCOUNTER — Encounter: Payer: Self-pay | Admitting: Internal Medicine

## 2024-06-28 ENCOUNTER — Other Ambulatory Visit: Payer: Self-pay

## 2024-06-28 ENCOUNTER — Inpatient Hospital Stay

## 2024-06-28 ENCOUNTER — Inpatient Hospital Stay: Attending: Internal Medicine | Admitting: Internal Medicine

## 2024-06-28 ENCOUNTER — Inpatient Hospital Stay: Admitting: Hospice and Palliative Medicine

## 2024-06-28 ENCOUNTER — Inpatient Hospital Stay: Attending: Internal Medicine

## 2024-06-28 ENCOUNTER — Ambulatory Visit
Admission: RE | Admit: 2024-06-28 | Discharge: 2024-06-28 | Disposition: A | Source: Ambulatory Visit | Attending: Radiation Oncology | Admitting: Radiation Oncology

## 2024-06-28 VITALS — BP 101/68 | HR 68 | Temp 98.0°F | Resp 19 | Ht 66.0 in | Wt 161.6 lb

## 2024-06-28 DIAGNOSIS — C099 Malignant neoplasm of tonsil, unspecified: Secondary | ICD-10-CM

## 2024-06-28 DIAGNOSIS — Z515 Encounter for palliative care: Secondary | ICD-10-CM

## 2024-06-28 LAB — CMP (CANCER CENTER ONLY)
ALT: 13 U/L (ref 0–44)
AST: 40 U/L (ref 15–41)
Albumin: 3.8 g/dL (ref 3.5–5.0)
Alkaline Phosphatase: 68 U/L (ref 38–126)
Anion gap: 12 (ref 5–15)
BUN: 34 mg/dL — ABNORMAL HIGH (ref 8–23)
CO2: 27 mmol/L (ref 22–32)
Calcium: 9.4 mg/dL (ref 8.9–10.3)
Chloride: 100 mmol/L (ref 98–111)
Creatinine: 0.9 mg/dL (ref 0.61–1.24)
GFR, Estimated: >60 mL/min
Glucose, Bld: 224 mg/dL — ABNORMAL HIGH (ref 70–99)
Potassium: 4.2 mmol/L (ref 3.5–5.1)
Sodium: 139 mmol/L (ref 135–145)
Total Bilirubin: 0.5 mg/dL (ref 0.0–1.2)
Total Protein: 6.6 g/dL (ref 6.5–8.1)

## 2024-06-28 LAB — CBC WITH DIFFERENTIAL (CANCER CENTER ONLY)
Abs Immature Granulocytes: 0.07 10*3/uL (ref 0.00–0.07)
Basophils Absolute: 0 10*3/uL (ref 0.0–0.1)
Basophils Relative: 0 %
Eosinophils Absolute: 0.2 10*3/uL (ref 0.0–0.5)
Eosinophils Relative: 3 %
HCT: 36.1 % — ABNORMAL LOW (ref 39.0–52.0)
Hemoglobin: 11.5 g/dL — ABNORMAL LOW (ref 13.0–17.0)
Immature Granulocytes: 1 %
Lymphocytes Relative: 14 %
Lymphs Abs: 0.9 10*3/uL (ref 0.7–4.0)
MCH: 31 pg (ref 26.0–34.0)
MCHC: 31.9 g/dL (ref 30.0–36.0)
MCV: 97.3 fL (ref 80.0–100.0)
Monocytes Absolute: 0.3 10*3/uL (ref 0.1–1.0)
Monocytes Relative: 4 %
Neutro Abs: 5 10*3/uL (ref 1.7–7.7)
Neutrophils Relative %: 78 %
Platelet Count: 222 10*3/uL (ref 150–400)
RBC: 3.71 MIL/uL — ABNORMAL LOW (ref 4.22–5.81)
RDW: 13.4 % (ref 11.5–15.5)
WBC Count: 6.4 10*3/uL (ref 4.0–10.5)
nRBC: 0 % (ref 0.0–0.2)

## 2024-06-28 LAB — RAD ONC ARIA SESSION SUMMARY
Course Elapsed Days: 8
Plan Fractions Treated to Date: 6
Plan Prescribed Dose Per Fraction: 2 Gy
Plan Total Fractions Prescribed: 35
Plan Total Prescribed Dose: 70 Gy
Reference Point Dosage Given to Date: 12 Gy
Reference Point Session Dosage Given: 2 Gy
Session Number: 6

## 2024-06-28 MED ORDER — NYSTATIN 100000 UNIT/ML MT SUSP: 5.0000 mL | Freq: Four times a day (QID) | OROMUCOSAL | 0 refills | Status: AC

## 2024-06-28 MED ORDER — SODIUM CHLORIDE 0.9 % IV SOLN
INTRAVENOUS | Status: DC
  Filled 2024-06-28: qty 250

## 2024-06-28 MED ORDER — FAMOTIDINE IN NACL 20-0.9 MG/50ML-% IV SOLN
20.0000 mg | Freq: Once | INTRAVENOUS | Status: AC
  Administered 2024-06-28: 20 mg via INTRAVENOUS
  Filled 2024-06-28: qty 50

## 2024-06-28 MED ORDER — SODIUM CHLORIDE 0.9 % IV SOLN
157.6000 mg | Freq: Once | INTRAVENOUS | Status: AC
  Administered 2024-06-28: 160 mg via INTRAVENOUS
  Filled 2024-06-28: qty 16

## 2024-06-28 MED ORDER — PALONOSETRON HCL INJECTION 0.25 MG/5ML
0.2500 mg | Freq: Once | INTRAVENOUS | Status: AC
  Administered 2024-06-28: 0.25 mg via INTRAVENOUS
  Filled 2024-06-28: qty 5

## 2024-06-28 MED ORDER — DEXAMETHASONE SOD PHOSPHATE PF 10 MG/ML IJ SOLN
10.0000 mg | Freq: Once | INTRAMUSCULAR | Status: AC
  Administered 2024-06-28: 10 mg via INTRAVENOUS
  Filled 2024-06-28: qty 1

## 2024-06-28 MED ORDER — STERILE WATER FOR INJECTION IJ SOLN
5.0000 mL | Freq: Four times a day (QID) | OROMUCOSAL | 3 refills | Status: AC | PRN
  Filled 2024-06-28: qty 480, 12d supply, fill #0

## 2024-06-28 MED ORDER — HYDROCODONE-ACETAMINOPHEN 5-325 MG PO TABS: 1.0000 | ORAL_TABLET | ORAL | 0 refills | Status: AC | PRN

## 2024-06-28 MED ORDER — SODIUM CHLORIDE 0.9 % IV SOLN
45.0000 mg/m2 | Freq: Once | INTRAVENOUS | Status: AC
  Administered 2024-06-28: 84 mg via INTRAVENOUS
  Filled 2024-06-28: qty 14

## 2024-06-28 MED ORDER — DIPHENHYDRAMINE HCL 50 MG/ML IJ SOLN
50.0000 mg | Freq: Once | INTRAMUSCULAR | Status: AC
  Administered 2024-06-28: 50 mg via INTRAVENOUS
  Filled 2024-06-28: qty 1

## 2024-06-28 NOTE — Patient Instructions (Signed)
 [facility] - A DEPT OF [facility]  Discharge Instructions: Thank you for choosing [facility] to provide your oncology and hematology care.  If you have a lab appointment with the Cancer Center, please go directly to the Cancer Center and check in at the registration area.  Wear comfortable clothing and clothing appropriate for easy access to any Portacath or PICC line.   We strive to give you quality time with your provider. You may need to reschedule your appointment if you arrive late (15 or more minutes).  Arriving late affects you and other patients whose appointments are after yours.  Also, if you miss three or more appointments without notifying the office, you may be dismissed from the clinic at the providers discretion.      For prescription refill requests, have your pharmacy contact our office and allow 72 hours for refills to be completed.    Today you received the following chemotherapy and/or immunotherapy agents: CARBOplatin  (PARAPLATIN ),  PACLitaxel  (TAXOL )      To help prevent nausea and vomiting after your treatment, we encourage you to take your nausea medication as directed.  BELOW ARE SYMPTOMS THAT SHOULD BE REPORTED IMMEDIATELY: *FEVER GREATER THAN 100.4 F (38 C) OR HIGHER *CHILLS OR SWEATING *NAUSEA AND VOMITING THAT IS NOT CONTROLLED WITH YOUR NAUSEA MEDICATION *UNUSUAL SHORTNESS OF BREATH *UNUSUAL BRUISING OR BLEEDING *URINARY PROBLEMS (pain or burning when urinating, or frequent urination) *BOWEL PROBLEMS (unusual diarrhea, constipation, pain near the anus) TENDERNESS IN MOUTH AND THROAT WITH OR WITHOUT PRESENCE OF ULCERS (sore throat, sores in mouth, or a toothache) UNUSUAL RASH, SWELLING OR PAIN  UNUSUAL VAGINAL DISCHARGE OR ITCHING   Items with * indicate a potential emergency and should be followed up as soon as possible or go to the Emergency Department if any problems should occur.  Please show the  CHEMOTHERAPY ALERT CARD or IMMUNOTHERAPY ALERT CARD at check-in to the Emergency Department and triage nurse.  Should you have questions after your visit or need to cancel or reschedule your appointment, please contact [facility] - A DEPT OF [facility]  [phone] and follow the prompts.  Office hours are 8:00 a.m. to 4:30 p.m. Monday - Friday. Please note that voicemails left after 4:00 p.m. may not be returned until the following business day.  We are closed weekends and major holidays. You have access to a nurse at all times for urgent questions. Please call the main number to the clinic [phone] and follow the prompts.  For any non-urgent questions, you may also contact your provider using MyChart. We now offer e-Visits for anyone 54 and older to request care online for non-urgent symptoms. For details visit [email or web address].   Also download the MyChart app! Go to the app store, search MyChart, open the app, select [facility], and log in with your MyChart username and password.

## 2024-06-28 NOTE — Assessment & Plan Note (Addendum)
#   PET DEC, 23rd, 2025- [Dr.McQueen]- Hypermetabolic uptake in the inferior oropharynx along the left floor of mouth with extension up into the posterior left nasopharyngeal region. Imaging features compatible with known primary neoplasm;  Hypermetabolic bilateral level II and left level III cervical lymph nodes compatible with metastatic disease. No evidence for hypermetabolic metastatic disease in the chest, abdomen, or pelvis.  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  rU5W7 -stage III. # Recommend proceeding with weekly CarboTaxol-concurrent radiation [Jan 29th through March 16th].    # Proceed with cycle #2  of carbo-Taxol  of planned 6-8 cycles. Labs-CBC/chemistries were reviewed with the patient.  # Oral mucositis-Oral thrush- recommend Nystatin  first preference; down the line- recommend magic mouth wash prn  # Diabetes- not controlled-recommend compliance with medications- on metformin 500 XL BID; if significantly elevated recommend glipizide. Monitor for now-    # Parkinson- [walks with cane; Dr.Shah]- Hx of falls- On lamictal- for mood swings-monitor neuropathy and tremors closely given the risk of falls- monitor closely on taxol .  Stable.  # Nutrition: S/P PEG tube [Dr.Cintron]  # Abdominal pain- sec to s/p PEG tube incision pain- start hydrocodone  prn.  Okay  # IV access: Mediport [Dr.Cintron];   # ACP:s/p JOSH.   # weekly carbo-taxol - MD;labs-x3; PS  # DISPOSITION: # magic mouth wash # chemo today # in 1 week- MD; labs- cbc/cmp;chemo # # in 2 week- MD; labs- cbc/cmp;chemo # in 3 week- MD; labs- cbc/cmp; chemo- Dr.B

## 2024-06-28 NOTE — Progress Notes (Signed)
 [facility] CONSULT NOTE  Patient Care Team: Diedra Lame, MD as PCP - General (Family Medicine) Rennie Cindy SAUNDERS, MD as Consulting Physician (Oncology) Lenn Aran, MD as Consulting Physician (Radiation Oncology) Borders, Fonda SAUNDERS, NP as Nurse Practitioner (Hospice and Palliative Medicine)  CHIEF COMPLAINTS/PURPOSE OF CONSULTATION: head and neck cancer  #  Oncology History  Tonsillar cancer (HCC)  05/22/2024 Initial Diagnosis   Tonsillar cancer (HCC)   05/22/2024 Cancer Staging   Staging form: Pharynx - Oropharynx (HPV-Associated), AJCC V9 - Clinical: Stage III (cT4, cN2, cM0, p16+) - Signed by Rennie Cindy SAUNDERS, MD on 05/22/2024   06/21/2024 -  Chemotherapy   Patient is on Treatment Plan : HEAD/NECK Carboplatin  + Paclitaxel  + XRT q7d       HISTORY OF PRESENTING ILLNESS:  Bradley [name] 89 y.o.  male with hx of Parkinson disease and tonsillar cancer- HPV positive stage III is here for a follow up.   Discussed the use of AI scribe software for clinical note transcription with the patient, who gave verbal consent to proceed.   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  He is in the early phase of concurrent chemoradiation for HPV-positive tonsillar cancer, having completed five or six treatments. He experiences increasing oropharyngeal pain, requiring hydrocodone  four to five times daily, and significant oral soreness and xerostomia, at times making it difficult to speak. He maintains frequent oral hydration and has a feeding tube in place for nutritional support. Fatigue occurs following chemotherapy but otherwise therapy is tolerated well.  He has developed oral candidiasis involving the throat, associated with soreness. He wears dentures, including a small plate, but symptoms  are not attributed to denture use. He denies tingling, numbness, nausea, or vomiting.  He reports persistent abdominal bloating and a sensation of fullness for over a month, with intermittent sharp abdominal pain at night, relieved by manual pressure. These episodes can awaken him from sleep.  Blood glucose levels remain elevated, with a recent value of 224 mg/dL, improved from a prior value of 320 mg/dL. He is attempting dietary moderation and denies excessive intake of sweets, despite encouragement to maintain weight during therapy.      Review of Systems  Constitutional:  Positive for malaise/fatigue and weight loss. Negative for chills, diaphoresis and fever.  HENT:  Negative for nosebleeds and sore throat.   Eyes:  Negative for double vision.  Respiratory:  Negative for cough, hemoptysis, sputum production, shortness of breath and wheezing.   Cardiovascular:  Negative for chest pain, palpitations, orthopnea and leg swelling.  Gastrointestinal:  Negative for abdominal pain, blood in stool, constipation, diarrhea, heartburn, melena, nausea and vomiting.  Genitourinary:  Negative for dysuria, frequency and urgency.  Musculoskeletal:  Positive for back pain and joint pain.  Skin: Negative.  Negative for itching and rash.  Neurological:  Positive for dizziness, tingling, tremors and weakness. Negative for focal weakness and headaches.  Endo/Heme/Allergies:  Does not bruise/bleed easily.  Psychiatric/Behavioral:  Negative for depression. The patient is not nervous/anxious and does not have insomnia.      MEDICAL HISTORY:  Past Medical History:  Diagnosis Date   Anemia    BPH (benign prostatic hypertrophy)    COPD (chronic obstructive pulmonary disease) (HCC)    Diabetes type 2, controlled (HCC)    GERD (gastroesophageal reflux disease)    RARE   GI bleed  2024   Glaucoma    [place]   Hearing loss    bilateral hearing aids occasionally   History of kidney stones    HLD  (hyperlipidemia)    HTN (hypertension)    Macular degeneration    Right ([place])   Neuromuscular disorder (HCC)    Osteoarthritis    thumbs, hips, knees (s/p R TKR)   Parkinson disease (HCC)    PUD (peptic ulcer disease) 03/2013   2 antral gastric ulcers and 1 duodenal ulcer, NSAID related, admitted to [facility] with melena/anemia s/p EGD   Sleep apnea    CPAP   Tremor    intention    SURGICAL HISTORY: Past Surgical History:  Procedure Laterality Date   BELPHAROPTOSIS REPAIR  2012   BIOPSY  11/10/2022   Procedure: BIOPSY;  Surgeon: Maryruth Ole DASEN, MD;  Location: [facility];  Service: Endoscopy;;   carotid US   12/2011   no significant stenosis   CATARACT EXTRACTION Bilateral 2011, 2012   CHOLECYSTECTOMY  1997   COLONOSCOPY  2009   per patient, told no longer needed   CREATION, GASTROSTOMY, OPEN N/A 06/01/2024   Procedure: CREATION, GASTROSTOMY, OPEN;  Surgeon: Rodolph Romano, MD;  Location: [facility] OR[facility];  Service: General;  Laterality: N/A;   CYSTOSCOPY W/ URETERAL STENT PLACEMENT Left 11/01/2016   Procedure: CYSTOSCOPY WITH STENT REPLACEMENT;  Surgeon: Penne Knee, MD;  Location: [facility];  Service: Urology;  Laterality: Left;   CYSTOSCOPY WITH STENT PLACEMENT Left 10/26/2016   Procedure: CYSTOSCOPY WITH STENT PLACEMENT;  Surgeon: Penne Knee, MD;  Location: [facility];  Service: Urology;  Laterality: Left;   ESOPHAGOGASTRODUODENOSCOPY  04/2013   hospitalization @ [facility], gastritis, gastric ulcers and duodenal ulcer with clean base ([place])   ESOPHAGOGASTRODUODENOSCOPY (EGD) WITH PROPOFOL  N/A 11/10/2022   Procedure: ESOPHAGOGASTRODUODENOSCOPY (EGD) WITH PROPOFOL ;  Surgeon: Maryruth Ole DASEN, MD;  Location: [facility];  Service: Endoscopy;  Laterality: N/A;   HOLEP-LASER ENUCLEATION OF THE PROSTATE WITH MORCELLATION N/A 11/01/2016   Procedure: HOLEP-LASER ENUCLEATION OF THE PROSTATE WITH MORCELLATION;  Surgeon: Penne Knee, MD;  Location: [facility];  Service:  Urology;  Laterality: N/A;   KIDNEY STONE SURGERY  73, 31, 90   PORTACATH PLACEMENT N/A 06/01/2024   Procedure: INSERTION, TUNNELED CENTRAL VENOUS DEVICE, WITH PORT;  Surgeon: Rodolph Romano, MD;  Location: [facility];  Service: General;  Laterality: N/A;   TOTAL KNEE ARTHROPLASTY Right 2006   URETEROSCOPY Left 10/26/2016   Procedure: URETEROSCOPY;  Surgeon: Penne Knee, MD;  Location: [facility];  Service: Urology;  Laterality: Left;   URETEROSCOPY WITH HOLMIUM LASER LITHOTRIPSY Left 11/01/2016   Procedure: URETEROSCOPY WITH HOLMIUM LASER LITHOTRIPSY;  Surgeon: Penne Knee, MD;  Location: [facility];  Service: Urology;  Laterality: Left;   US  ECHOCARDIOGRAPHY  12/2011   nl LV fxn, EF 60%, nl valves, dilated aortic root (40mm)    SOCIAL HISTORY: Social History   Socioeconomic History   Marital status: Married    Spouse name: Not on file   Number of children: Not on file   Years of education: Not on file   Highest education level: Not on file  Occupational History   Not on file  Tobacco Use   Smoking status: Former    Current packs/day: 0.00    Average packs/day: 1 pack/day for 45.0 years (45.0 ttl pk-yrs)    Types: Cigarettes, Cigars    Start date: 05/24/1998    Quit date: 10/23/1998    Years since quitting: 25.6   Smokeless tobacco: Never  Vaping Use   Vaping status: Never Used  Substance and Sexual Activity   Alcohol use: Not Currently    Comment: Occasional, maybe 5-6 per year   Drug use: No   Sexual activity: Not Currently  Other Topics Concern   Not on file  Social History Narrative   stYgerwaltLives with wife, 2 dogsGrown children by first 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   Social Drivers of Health   Tobacco Use: Medium Risk (06/28/2024)   Patient History    Smoking Tobacco Use: Former    Smokeless Tobacco Use: Never    Passive Exposure: Not on file  Financial Resource  Strain: Low Risk  (11/15/2023)   Received from [facility]   Overall Financial Resource Strain (CARDIA)    Difficulty of Paying Living Expenses: Not hard at all  Food Insecurity: No Food Insecurity (05/22/2024)   Epic    Worried About Programme Researcher, Broadcasting/film/video in the Last Year: Never true    Ran Out of Food in the Last Year: Never true  Transportation Needs: No Transportation Needs (05/22/2024)   Epic    Lack of Transportation (Medical): No    Lack of Transportation (Non-Medical): No  Physical Activity: Not on file  Stress: Not on file  Social Connections: Not on file  Intimate Partner Violence: Not At Risk (11/09/2022)   Humiliation, Afraid, Rape, and Kick questionnaire    Fear of Current or Ex-Partner: No    Emotionally Abused: No    Physically Abused: No    Sexually Abused: No  Depression (PHQ2-9): Low Risk (06/21/2024)   Depression (PHQ2-9)    PHQ-2 Score: 0  Alcohol Screen: Not on file  Housing: Low Risk (05/22/2024)   Epic    Unable to Pay for Housing in the Last Year: No    Number of Times Moved in the Last Year: 0    Homeless in the Last Year: No  Utilities: Not At Risk (05/22/2024)   Epic    Threatened with loss of utilities: No  Health Literacy: Not on file    FAMILY HISTORY: Family History  Problem Relation Age of Onset   Cancer Sister        lung, nonsmoker   Cancer Sister        lung, nonsmoker   Dementia Mother    CAD Father        heart problems   CAD Brother 79       MI   Stroke Neg Hx    Prostate cancer Neg Hx    Bladder Cancer Neg Hx    Kidney cancer Neg Hx     ALLERGIES:  is allergic to sulfa antibiotics and penicillins.  MEDICATIONS:  Current Outpatient Medications  Medication Sig Dispense Refill   albuterol  (VENTOLIN  HFA) 108 (90 Base) MCG/ACT inhaler Inhale 1-2 puffs into the lungs every 4 (four) hours as needed for shortness of breath or wheezing.     aspirin  EC (ASPIRIN  81) 81 MG tablet Take 1 tablet (81 mg total) by mouth  daily. 30 tablet 12   atorvastatin  (LIPITOR) 20 MG tablet Take 20 mg by mouth every evening.      Biotin w/ Vitamins C & E (HAIR/SKIN/NAILS PO) Take 1,666 mg by mouth 2 (two) times daily.     brimonidine -timolol  (COMBIGAN ) 0.2-0.5 % ophthalmic solution Place 1 drop into both eyes 2 (two) times daily.      buPROPion  (  WELLBUTRIN  XL) 150 MG 24 hr tablet Take 150 mg by mouth every morning.     carbidopa -levodopa  (SINEMET  CR) 50-200 MG tablet Take 1 tablet by mouth at bedtime.     carbidopa -levodopa  (SINEMET  IR) 25-100 MG tablet Take 2 tablets by mouth 4 (four) times daily. Extended release Taken at 0730, 1130, 1500 and 1830     Cholecalciferol  (VITAMIN D3) 1000 units CAPS Take 1,000 Units by mouth 2 (two) times daily.     docusate sodium  (COLACE) 100 MG capsule Take 100 mg by mouth 2 (two) times daily.     finasteride  (PROSCAR ) 5 MG tablet Take 5 mg by mouth every morning.      fluticasone (FLONASE) 50 MCG/ACT nasal spray Place 1 spray into both nostrils daily.     ipratropium (ATROVENT ) 0.06 % nasal spray Place 1 spray into both nostrils 2 (two) times daily as needed for rhinitis.     lamoTRIgine (LAMICTAL) 25 MG tablet Take 25 mg by mouth 2 (two) times daily.     lidocaine -prilocaine  (EMLA ) cream Apply 1 Application topically as needed. 30 g 0   Melatonin 10 MG TABS Take 10 mg by mouth at bedtime.     metFORMIN (GLUCOPHAGE-XR) 500 MG 24 hr tablet Take 500 mg by mouth 2 (two) times daily between meals as needed.     Multiple Vitamins-Minerals (PRESERVISION AREDS 2 PO) Take 1 tablet by mouth 2 (two) times daily.     Nutritional Supplements (NUTREN 1.5) LIQD Give 2 cartons via bolus syringe at 8am and noon and 1 carton at 4pm and 8pm.  Flush with 60ml of water  before and after each feeding.  Flush with additional 240ml of water  TID between feedings for additional hydration.     nystatin  (MYCOSTATIN ) 100000 UNIT/ML suspension Take 5 mLs (500,000 Units total) by mouth 4 (four) times daily. 60 mL 0    ondansetron  (ZOFRAN ) 8 MG tablet Take 1 tablet (8 mg total) by mouth every 8 (eight) hours as needed for nausea or vomiting. 20 tablet 0   pantoprazole  (PROTONIX ) 40 MG tablet Take 1 tablet (40 mg total) by mouth 2 (two) times daily. 120 tablet 0   polyethylene glycol powder (GLYCOLAX /MIRALAX ) 17 GM/SCOOP powder Take 17 g by mouth daily.     prochlorperazine  (COMPAZINE ) 10 MG tablet Take 1 tablet (10 mg total) by mouth every 6 (six) hours as needed for nausea or vomiting. 30 tablet 0   rOPINIRole  (REQUIP ) 0.5 MG tablet Take 0.5 mg by mouth QID. 8 am, 1130, 1500, 1830     sertraline  (ZOLOFT ) 100 MG tablet Take 100 mg by mouth daily.     torsemide (DEMADEX) 20 MG tablet Take 10 mg by mouth daily. Takes M-Sat am     traMADol  (ULTRAM ) 50 MG tablet Take 1 tablet (50 mg total) by mouth every 6 (six) hours as needed. 10 tablet 0   HYDROcodone -acetaminophen  (NORCO/VICODIN) 5-325 MG tablet Take 1 tablet by mouth every 4 (four) hours as needed for moderate pain (pain score 4-6). 60 tablet 0   No current facility-administered medications for this visit.   PHYSICAL EXAMINATION:    Vitals:   06/28/24 0834  BP: 101/68  Pulse: 68  Resp: 19  Temp: 98 F (36.7 C)  SpO2: 96%    Filed Weights   06/28/24 0834  Weight: 161 lb 9.6 oz (73.3 kg)   Oral thrush noted- Bilateral neck adenopathy-the left tonsillar lesion cannot be evaluated today.   Physical Exam Vitals and nursing note reviewed.  HENT:     Head: Normocephalic and atraumatic.     Mouth/Throat:     Pharynx: Oropharynx is clear.  Eyes:     Extraocular Movements: Extraocular movements intact.     Pupils: Pupils are equal, round, and reactive to light.  Cardiovascular:     Rate and Rhythm: Normal rate and regular rhythm.  Pulmonary:     Comments: Decreased breath sounds bilaterally.  Abdominal:     Palpations: Abdomen is soft.  Musculoskeletal:        General: Normal range of motion.     Cervical back: Normal range of motion.   Skin:    General: Skin is warm.  Neurological:     General: No focal deficit present.     Mental Status: He is alert and oriented to person, place, and time.  Psychiatric:        Behavior: Behavior normal.        Judgment: Judgment normal.      LABORATORY DATA:  I have reviewed the data as listed Lab Results  Component Value Date   WBC 6.4 06/28/2024   HGB 11.5 (L) 06/28/2024   HCT 36.1 (L) 06/28/2024   MCV 97.3 06/28/2024   PLT 222 06/28/2024   Recent Labs    06/11/24 1016 06/21/24 0828 06/28/24 0822  NA 139 138 139  K 4.5 4.3 4.2  CL 99 100 100  CO2 33* 28 27  GLUCOSE 310* 234* 224*  BUN 32* 44* 34*  CREATININE 0.92 0.94 0.90  CALCIUM  9.7 9.5 9.4  GFRNONAA >60 >60 >60  PROT 6.9 6.8 6.6  ALBUMIN 3.8 3.8 3.8  AST 35 27 40  ALT 12 7 13   ALKPHOS 83 74 68  BILITOT 0.4 0.3 0.5    RADIOGRAPHIC STUDIES: I have personally reviewed the radiological images as listed and agreed with the findings in the report. DG Chest Port 1 View Result Date: 06/01/2024 CLINICAL DATA:  Port-A-Cath placement, head and neck cancer EXAM: PORTABLE CHEST 1 VIEW COMPARISON:  05/16/2024 FINDINGS: Single frontal view of the chest demonstrates right chest wall port via internal jugular approach, tip overlying superior vena cava. Cardiac silhouette is enlarged. No acute airspace disease, effusion, or pneumothorax. No acute bony abnormalities. IMPRESSION: 1. No complication after right chest wall port placement. Electronically Signed   By: Ozell Daring M.D.   On: 06/01/2024 15:56   DG C-Arm 1-60 Min-No Report Result Date: 06/01/2024 Fluoroscopy was utilized by the requesting physician.  No radiographic interpretation.    ASSESSMENT & PLAN:   Tonsillar cancer (HCC) # PET DEC, 23rd, 2025- [Dr.McQueen]- Hypermetabolic uptake in the inferior oropharynx along the left floor of mouth with extension up into the posterior left nasopharyngeal region. Imaging features compatible with known primary  neoplasm;  Hypermetabolic bilateral level II and left level III cervical lymph nodes compatible with metastatic disease. No evidence for hypermetabolic metastatic disease in the chest, abdomen, or pelvis.  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  [id number] -stage III. # Recommend proceeding with weekly CarboTaxol-concurrent radiation [Jan 29th through March 16th].    # Proceed with cycle #2  of carbo-Taxol  of planned 6-8 cycles. Labs-CBC/chemistries were reviewed with the patient.  # Oral mucositis-Oral thrush- recommend Nystatin  first preference; down the line- recommend magic mouth wash prn  # Diabetes- not controlled-recommend compliance with  medications- on metformin 500 XL BID; if significantly elevated recommend glipizide. Monitor for now-    # Parkinson- [walks with cane; Dr.Shah]- Hx of falls- On lamictal- for mood swings-monitor neuropathy and tremors closely given the risk of falls- monitor closely on taxol .  Stable.  # Nutrition: S/P PEG tube [Dr.Cintron]  # Abdominal pain- sec to s/p PEG tube incision pain- start hydrocodone  prn.  Okay  # IV access: Mediport [Dr.Cintron];   # ACP:s/p JOSH.   # weekly carbo-taxol - MD;labs-x3; PS  # DISPOSITION: # magic mouth wash # chemo today # in 1 week- MD; labs- cbc/cmp;chemo # # in 2 week- MD; labs- cbc/cmp;chemo # in 3 week- MD; labs- cbc/cmp; chemo- Dr.B  All questions were answered. The patient knows to call the clinic with any problems, questions or concerns.    Shaima Sardinas R Murad Staples, MD 06/28/2024 9:30 AM

## 2024-06-28 NOTE — Progress Notes (Signed)
 Nutrition Follow-up:  Patient with HPV stage III tonsillar cancer.  Patient receiving concurrent radiation and chemotherapy (carboplatin  and taxol ).  Patient has PEG tube.   Met with patient and wife during infusion.  Patient has oral thrush and receiving medication today for that.  Reports some sore throat but not effecting oral intake.  Continues to eat solid foods (scrambled eggs, 1/2 -full sandwich, chicken with mashed potatoes, etc).  Drinking bai drinks, coffee, diet sprite, crystal light, premier protein, milk.  Reports having bowel movement 1-2 times a day.  Does not have constipation.    Noted follow-up with surgeon PA with stiches removal    Medications: nystatin   Labs: glucose 224, BUN 34, creatinine 0.90  Anthropometrics:   Weight 161 lb 9.6 oz on 2/5 159 lb on 1/29 155 lb 7 oz on 1/22 158 lb on 1/12 168 lb on 08/25/23   Estimated Energy Needs  Kcals: 1800-2160 Protein: 86-108 g Fluid: >  NUTRITION DIAGNOSIS: Inadequate oral intake continues    INTERVENTION:  Reduce tube feeding to 3 cartons per day via bolus syringe.  Flush with 8 oz of water  at each feeding. Wife to increase tube feeding if oral intake decreases over next week Continue to eat soft foods orally as able. Drink 2 premier protein shakes for added calories and low in sugar     MONITORING, EVALUATION, GOAL: weight trends, tube feeding   NEXT VISIT: Thursday, Feb 12 during infusion  Shavonn Convey B. Dasie SOLON, CSO, LDN Registered Dietitian [phone]

## 2024-06-28 NOTE — Progress Notes (Signed)
 Medicated Mouthwash prescription faxed to [facility]. Wife aware that prescription has been sent to that location.

## 2024-06-28 NOTE — Progress Notes (Signed)
 "    Palliative Medicine [facility] at [facility] Telephone:([phone]) [phone] Fax:(336) [phone]   Name: Bradley [name] Date: 06/28/2024 MRN: [id number]  DOB: 19-Jun-1935  Patient Care Team: Bradley Lame, MD as PCP - General (Family Medicine) Bradley Cindy SAUNDERS, MD as Consulting Physician (Oncology) Bradley Aran, MD as Consulting Physician (Radiation Oncology) Bradley [name], Bradley SAUNDERS, NP as Nurse Practitioner (Hospice and Palliative Medicine)    REASON FOR CONSULTATION: Bradley [name] is a 89 y.o. 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  Patient has had progressive dysphagia and odynophagia.  Patient was referred to palliative care to address goals and manage ongoing symptoms.  SOCIAL HISTORY:     reports that he quit smoking about 25 years ago. His smoking use included cigarettes and cigars. He started smoking about 26 years ago. He has a 45 pack-year smoking history. He has never used smokeless tobacco. He reports that he does not currently use alcohol. He reports that he does not use drugs.  Patient married to his wife of 45 years.  Lives at [facility].  Has 3 children in [place]  from a previous marriage.  Patient retired as a production designer, theatre/television/film of a programme researcher, broadcasting/film/video.  ADVANCE DIRECTIVES:  On file  CODE STATUS: DNR/DNI (MOST form completed on 05/28/2024)  PAST MEDICAL HISTORY: Past Medical History:  Diagnosis Date   Anemia    BPH (benign prostatic hypertrophy)    COPD (chronic obstructive pulmonary disease) (HCC)    Diabetes type 2, controlled (HCC)    GERD (gastroesophageal reflux disease)    RARE   GI bleed    2024   Glaucoma    [place]   Hearing loss    bilateral hearing aids occasionally   History of kidney stones    HLD (hyperlipidemia)    HTN (hypertension)    Macular degeneration    Right ([place])   Neuromuscular disorder (HCC)     Osteoarthritis    thumbs, hips, knees (s/p R TKR)   Parkinson disease (HCC)    PUD (peptic ulcer disease) 03/2013   2 antral gastric ulcers and 1 duodenal ulcer, NSAID related, admitted to [facility] with melena/anemia s/p EGD   Sleep apnea    CPAP   Tremor    intention    PAST SURGICAL HISTORY:  Past Surgical History:  Procedure Laterality Date   BELPHAROPTOSIS REPAIR  2012   BIOPSY  11/10/2022   Procedure: BIOPSY;  Surgeon: Maryruth Ole DASEN, MD;  Location: [facility] ENDOSCOPY;  Service: Endoscopy;;   carotid US   12/2011   no significant stenosis   CATARACT EXTRACTION Bilateral 2011, 2012   CHOLECYSTECTOMY  1997   COLONOSCOPY  2009   per patient, told no longer needed   CREATION, GASTROSTOMY, OPEN N/A 06/01/2024   Procedure: CREATION, GASTROSTOMY, OPEN;  Surgeon: Rodolph Romano, MD;  Location: [facility] ORS;  Service: General;  Laterality: N/A;   CYSTOSCOPY W/ URETERAL STENT PLACEMENT Left 11/01/2016   Procedure: CYSTOSCOPY WITH STENT REPLACEMENT;  Surgeon: Penne Knee, MD;  Location: [facility] ORS;  Service: Urology;  Laterality: Left;   CYSTOSCOPY WITH STENT PLACEMENT Left 10/26/2016   Procedure: CYSTOSCOPY WITH STENT PLACEMENT;  Surgeon: Penne Knee, MD;  Location: [facility] ORS;  Service: Urology;  Laterality: Left;   ESOPHAGOGASTRODUODENOSCOPY  04/2013   hospitalization @ [facility], gastritis, gastric ulcers and duodenal ulcer with clean base ([place])   ESOPHAGOGASTRODUODENOSCOPY (EGD) WITH PROPOFOL  N/A 11/10/2022   Procedure: ESOPHAGOGASTRODUODENOSCOPY (EGD) WITH  PROPOFOL ;  Surgeon: Maryruth Ole DASEN, MD;  Location: [facility] ENDOSCOPY;  Service: Endoscopy;  Laterality: N/A;   HOLEP-LASER ENUCLEATION OF THE PROSTATE WITH MORCELLATION N/A 11/01/2016   Procedure: HOLEP-LASER ENUCLEATION OF THE PROSTATE WITH MORCELLATION;  Surgeon: Penne Knee, MD;  Location: [facility] ORS;  Service: Urology;  Laterality: N/A;   KIDNEY STONE SURGERY  20, 70, 90   PORTACATH PLACEMENT N/A 06/01/2024   Procedure:  INSERTION, TUNNELED CENTRAL VENOUS DEVICE, WITH PORT;  Surgeon: Rodolph Romano, MD;  Location: [facility] ORS;  Service: General;  Laterality: N/A;   TOTAL KNEE ARTHROPLASTY Right 2006   URETEROSCOPY Left 10/26/2016   Procedure: URETEROSCOPY;  Surgeon: Penne Knee, MD;  Location: [facility] ORS;  Service: Urology;  Laterality: Left;   URETEROSCOPY WITH HOLMIUM LASER LITHOTRIPSY Left 11/01/2016   Procedure: URETEROSCOPY WITH HOLMIUM LASER LITHOTRIPSY;  Surgeon: Penne Knee, MD;  Location: [facility] ORS;  Service: Urology;  Laterality: Left;   US  ECHOCARDIOGRAPHY  12/2011   nl LV fxn, EF 60%, nl valves, dilated aortic root (40mm)    HEMATOLOGY/ONCOLOGY HISTORY:  Oncology History  Tonsillar cancer (HCC)  05/22/2024 Initial Diagnosis   Tonsillar cancer (HCC)   05/22/2024 Cancer Staging   Staging form: Pharynx - Oropharynx (HPV-Associated), AJCC V9 - Clinical: Stage III (cT4, cN2, cM0, p16+) - Signed by Bradley Cindy SAUNDERS, MD on 05/22/2024   06/21/2024 -  Chemotherapy   Patient is on Treatment Plan : HEAD/NECK Carboplatin  + Paclitaxel  + XRT q7d       ALLERGIES:  is allergic to sulfa antibiotics and penicillins.  MEDICATIONS:  Current Outpatient Medications  Medication Sig Dispense Refill   albuterol  (VENTOLIN  HFA) 108 (90 Base) MCG/ACT inhaler Inhale 1-2 puffs into the lungs every 4 (four) hours as needed for shortness of breath or wheezing.     aspirin  EC (ASPIRIN  81) 81 MG tablet Take 1 tablet (81 mg total) by mouth daily. 30 tablet 12   atorvastatin  (LIPITOR) 20 MG tablet Take 20 mg by mouth every evening.      Biotin w/ Vitamins C & E (HAIR/SKIN/NAILS PO) Take 1,666 mg by mouth 2 (two) times daily.     brimonidine -timolol  (COMBIGAN ) 0.2-0.5 % ophthalmic solution Place 1 drop into both eyes 2 (two) times daily.      buPROPion  (WELLBUTRIN  XL) 150 MG 24 hr tablet Take 150 mg by mouth every morning.     carbidopa -levodopa  (SINEMET  CR) 50-200 MG tablet Take 1 tablet by mouth at bedtime.      carbidopa -levodopa  (SINEMET  IR) 25-100 MG tablet Take 2 tablets by mouth 4 (four) times daily. Extended release Taken at 0730, 1130, 1500 and 1830     Cholecalciferol  (VITAMIN D3) 1000 units CAPS Take 1,000 Units by mouth 2 (two) times daily.     docusate sodium  (COLACE) 100 MG capsule Take 100 mg by mouth 2 (two) times daily.     finasteride  (PROSCAR ) 5 MG tablet Take 5 mg by mouth every morning.      fluticasone (FLONASE) 50 MCG/ACT nasal spray Place 1 spray into both nostrils daily.     HYDROcodone -acetaminophen  (NORCO/VICODIN) 5-325 MG tablet Take 1 tablet by mouth every 4 (four) hours as needed for moderate pain (pain score 4-6). 60 tablet 0   ipratropium (ATROVENT ) 0.06 % nasal spray Place 1 spray into both nostrils 2 (two) times daily as needed for rhinitis.     lamoTRIgine (LAMICTAL) 25 MG tablet Take 25 mg by mouth 2 (two) times daily.     lidocaine -prilocaine  (EMLA ) cream Apply 1 Application  topically as needed. 30 g 0   Melatonin 10 MG TABS Take 10 mg by mouth at bedtime.     metFORMIN (GLUCOPHAGE-XR) 500 MG 24 hr tablet Take 500 mg by mouth 2 (two) times daily between meals as needed.     Multiple Vitamins-Minerals (PRESERVISION AREDS 2 PO) Take 1 tablet by mouth 2 (two) times daily.     Nutritional Supplements (NUTREN 1.5) LIQD Give 2 cartons via bolus syringe at 8am and noon and 1 carton at 4pm and 8pm.  Flush with 60ml of water  before and after each feeding.  Flush with additional 240ml of water  TID between feedings for additional hydration.     nystatin  (MYCOSTATIN ) 100000 UNIT/ML suspension Take 5 mLs (500,000 Units total) by mouth 4 (four) times daily. 60 mL 0   ondansetron  (ZOFRAN ) 8 MG tablet Take 1 tablet (8 mg total) by mouth every 8 (eight) hours as needed for nausea or vomiting. 20 tablet 0   pantoprazole  (PROTONIX ) 40 MG tablet Take 1 tablet (40 mg total) by mouth 2 (two) times daily. 120 tablet 0   polyethylene glycol powder (GLYCOLAX /MIRALAX ) 17 GM/SCOOP powder Take 17  g by mouth daily.     prochlorperazine  (COMPAZINE ) 10 MG tablet Take 1 tablet (10 mg total) by mouth every 6 (six) hours as needed for nausea or vomiting. 30 tablet 0   rOPINIRole  (REQUIP ) 0.5 MG tablet Take 0.5 mg by mouth QID. 8 am, 1130, 1500, 1830     sertraline  (ZOLOFT ) 100 MG tablet Take 100 mg by mouth daily.     torsemide (DEMADEX) 20 MG tablet Take 10 mg by mouth daily. Takes M-Sat am     traMADol  (ULTRAM ) 50 MG tablet Take 1 tablet (50 mg total) by mouth every 6 (six) hours as needed. 10 tablet 0   No current facility-administered medications for this visit.   Facility-Administered Medications Ordered in Other Visits  Medication Dose Route Frequency Provider Last Rate Last Admin   0.9 %  sodium chloride  infusion   Intravenous Continuous Brahmanday, Govinda R, MD 10 mL/hr at 06/28/24 0943 New Bag at 06/28/24 0943   CARBOplatin  (PARAPLATIN ) 160 mg in sodium chloride  0.9 % 100 mL chemo infusion  160 mg Intravenous Once Brahmanday, Govinda R, MD       PACLitaxel  (TAXOL ) 84 mg in sodium chloride  0.9 % 250 mL chemo infusion (</= 80mg /m2)  45 mg/m2 (Treatment Plan Recorded) Intravenous Once Brahmanday, Govinda R, MD        VITAL SIGNS: There were no vitals taken for this visit. There were no vitals filed for this visit.  Estimated body mass index is 26.08 kg/m as calculated from the following:   Height as of an earlier encounter on 06/28/24: 5' 6 (1.676 m).   Weight as of an earlier encounter on 06/28/24: 161 lb 9.6 oz (73.3 kg).  LABS: CBC:    Component Value Date/Time   WBC 6.4 06/28/2024 0822   WBC 8.5 08/25/2023 1112   HGB 11.5 (L) 06/28/2024 0822   HGB 9.3 (L) 04/23/2013 1422   HGB 15.3 12/01/2011 0000   HCT 36.1 (L) 06/28/2024 0822   HCT 27.5 (L) 04/23/2013 0523   PLT 222 06/28/2024 0822   PLT 164 04/23/2013 0523   MCV 97.3 06/28/2024 0822   MCV 94 04/23/2013 0523   NEUTROABS 5.0 06/28/2024 0822   NEUTROABS 4.1 04/23/2013 0523   LYMPHSABS 0.9 06/28/2024 0822    LYMPHSABS 1.6 04/23/2013 0523   MONOABS 0.3 06/28/2024 0822   MONOABS  0.5 04/23/2013 0523   EOSABS 0.2 06/28/2024 0822   EOSABS 0.2 04/23/2013 0523   BASOSABS 0.0 06/28/2024 0822   BASOSABS 0.0 04/23/2013 0523   Comprehensive Metabolic Panel:    Component Value Date/Time   NA 139 06/28/2024 0822   NA 143 04/23/2013 0523   K 4.2 06/28/2024 0822   K 3.2 (L) 04/23/2013 0523   K 4.2 12/01/2011 0000   CL 100 06/28/2024 0822   CL 109 (H) 04/23/2013 0523   CO2 27 06/28/2024 0822   CO2 29 04/23/2013 0523   BUN 34 (H) 06/28/2024 0822   BUN 13 04/23/2013 0523   CREATININE 0.90 06/28/2024 0822   CREATININE 0.68 04/23/2013 0523   CREATININE 0.79 12/01/2011 0000   GLUCOSE 224 (H) 06/28/2024 0822   GLUCOSE 78 04/23/2013 0523   CALCIUM  9.4 06/28/2024 0822   CALCIUM  7.7 (L) 04/23/2013 0523   AST 40 06/28/2024 0822   ALT 13 06/28/2024 0822   ALT 25 04/23/2013 0523   ALKPHOS 68 06/28/2024 0822   ALKPHOS 43 (L) 04/23/2013 0523   ALKPHOS 53 12/01/2011 0000   BILITOT 0.5 06/28/2024 0822   PROT 6.6 06/28/2024 0822   PROT 5.1 (L) 04/23/2013 0523   ALBUMIN 3.8 06/28/2024 0822   ALBUMIN 2.6 (L) 04/23/2013 0523    RADIOGRAPHIC STUDIES: DG Chest Port 1 View Result Date: 06/01/2024 CLINICAL DATA:  Port-A-Cath placement, head and neck cancer EXAM: PORTABLE CHEST 1 VIEW COMPARISON:  05/16/2024 FINDINGS: Single frontal view of the chest demonstrates right chest wall port via internal jugular approach, tip overlying superior vena cava. Cardiac silhouette is enlarged. No acute airspace disease, effusion, or pneumothorax. No acute bony abnormalities. IMPRESSION: 1. No complication after right chest wall port placement. Electronically Signed   By: Ozell Daring M.D.   On: 06/01/2024 15:56   DG C-Arm 1-60 Min-No Report Result Date: 06/01/2024 Fluoroscopy was utilized by the requesting physician.  No radiographic interpretation.    PERFORMANCE STATUS (ECOG) : 1 - Symptomatic but completely  ambulatory  Review of Systems Unless otherwise noted, a complete review of systems is negative.  Physical Exam General: NAD Pulmonary: Unlabored Extremities: no edema, no joint deformities Skin: no rashes Neurological: Weakness but otherwise nonfocal  IMPRESSION: Patient with newly diagnosed locally advanced head neck cancer.  Patient pending initiation of CarboTaxol and XRT.  Has is s/p PEG  Follow-up visit.  Patient seen in infusion.  Symptomatically, he has some constipation.  Discussed importance of daily bowel regimen.  Pain stable.  Wife managing PEG.  Per request of wife, I completed an additional 3 original MOST forms to be on record at [facility].  Patient's wishes to remain DNR/DNI.  I completed a MOST form today. The patient and family outlined their wishes for the following treatment decisions:  Cardiopulmonary Resuscitation: Do Not Attempt Resuscitation (DNR/No CPR)  Medical Interventions: Limited Additional Interventions: Use medical treatment, IV fluids and cardiac monitoring as indicated, DO NOT USE intubation or mechanical ventilation. May consider use of less invasive airway support such as BiPAP or CPAP. Also provide comfort measures. Transfer to the hospital if indicated. Avoid intensive care.   Antibiotics: Determine use of limitation of antibiotics when infection occurs  IV Fluids: IV fluids for a defined trial period  Feeding Tube: Feeding tube for a defined trial period    PLAN: - Continue current scope of treatment - Norco as needed - Daily bowel regimen with MiraLAX /senna - DNR/DNI - MOST form completed - Follow-up telephone visit 2 months  Patient expressed understanding and was in agreement with this plan. He also understands that He can call the clinic at any time with any questions, concerns, or complaints.     Time Total: 20 minutes  Visit consisted of counseling and education dealing with the complex and emotionally intense issues of  symptom management and palliative care in the setting of serious and potentially life-threatening illness.Greater than 50%  of this time was spent counseling and coordinating care related to the above assessment and plan.  Signed by: Bradley Mower, PhD, NP-C   "

## 2024-06-28 NOTE — Progress Notes (Signed)
 Patient states he is having some off and on pain at the feeding tube site. Patient states when pains starts at the feeding tube site he will take his hand and place it over the feeding tube and slightly push in and the pain will go away. Patient states he also is always swollen in the abdomen area. Patient wife states he is having more throat soreness.    Patient wife states he will run out of pain medication on Monday (Medication Hydrocodone )

## 2024-06-28 NOTE — Addendum Note (Signed)
 Addended by: LAEL BROWNING A on: 06/28/2024 01:09 PM   Modules accepted: Orders

## 2024-06-29 ENCOUNTER — Other Ambulatory Visit: Payer: Self-pay

## 2024-06-29 ENCOUNTER — Ambulatory Visit: Admission: RE | Admit: 2024-06-29 | Source: Ambulatory Visit

## 2024-06-29 ENCOUNTER — Encounter: Payer: Self-pay | Admitting: Internal Medicine

## 2024-06-29 LAB — RAD ONC ARIA SESSION SUMMARY
Course Elapsed Days: 9
Plan Fractions Treated to Date: 7
Plan Prescribed Dose Per Fraction: 2 Gy
Plan Total Fractions Prescribed: 35
Plan Total Prescribed Dose: 70 Gy
Reference Point Dosage Given to Date: 14 Gy
Reference Point Session Dosage Given: 2 Gy
Session Number: 7

## 2024-07-02 ENCOUNTER — Ambulatory Visit

## 2024-07-03 ENCOUNTER — Ambulatory Visit

## 2024-07-04 ENCOUNTER — Ambulatory Visit

## 2024-07-05 ENCOUNTER — Ambulatory Visit

## 2024-07-05 ENCOUNTER — Inpatient Hospital Stay

## 2024-07-05 ENCOUNTER — Inpatient Hospital Stay: Admitting: Internal Medicine

## 2024-07-06 ENCOUNTER — Ambulatory Visit

## 2024-07-09 ENCOUNTER — Ambulatory Visit

## 2024-07-10 ENCOUNTER — Ambulatory Visit

## 2024-07-11 ENCOUNTER — Ambulatory Visit

## 2024-07-12 ENCOUNTER — Inpatient Hospital Stay

## 2024-07-12 ENCOUNTER — Inpatient Hospital Stay: Admitting: Internal Medicine

## 2024-07-12 ENCOUNTER — Ambulatory Visit

## 2024-07-13 ENCOUNTER — Ambulatory Visit

## 2024-07-16 ENCOUNTER — Ambulatory Visit

## 2024-07-17 ENCOUNTER — Ambulatory Visit

## 2024-07-18 ENCOUNTER — Ambulatory Visit

## 2024-07-19 ENCOUNTER — Ambulatory Visit

## 2024-07-19 ENCOUNTER — Inpatient Hospital Stay

## 2024-07-19 ENCOUNTER — Inpatient Hospital Stay: Admitting: Internal Medicine

## 2024-07-20 ENCOUNTER — Ambulatory Visit

## 2024-07-23 ENCOUNTER — Ambulatory Visit

## 2024-07-24 ENCOUNTER — Ambulatory Visit

## 2024-07-25 ENCOUNTER — Ambulatory Visit

## 2024-07-26 ENCOUNTER — Ambulatory Visit

## 2024-07-27 ENCOUNTER — Ambulatory Visit

## 2024-07-30 ENCOUNTER — Ambulatory Visit

## 2024-07-31 ENCOUNTER — Ambulatory Visit

## 2024-08-01 ENCOUNTER — Ambulatory Visit

## 2024-08-02 ENCOUNTER — Ambulatory Visit

## 2024-08-03 ENCOUNTER — Ambulatory Visit

## 2024-08-06 ENCOUNTER — Ambulatory Visit

## 2024-08-07 ENCOUNTER — Ambulatory Visit

## 2024-08-08 ENCOUNTER — Ambulatory Visit

## 2024-09-04 ENCOUNTER — Inpatient Hospital Stay: Admitting: Hospice and Palliative Medicine
